# Patient Record
Sex: Female | Born: 1950
Health system: Southern US, Community
[De-identification: ages and names within clinical notes are randomized; demographics above are authoritative.]

## PROBLEM LIST (undated history)

## (undated) DIAGNOSIS — M1990 Unspecified osteoarthritis, unspecified site: Secondary | ICD-10-CM

## (undated) DIAGNOSIS — G459 Transient cerebral ischemic attack, unspecified: Secondary | ICD-10-CM

## (undated) DIAGNOSIS — K635 Polyp of colon: Secondary | ICD-10-CM

## (undated) DIAGNOSIS — J449 Chronic obstructive pulmonary disease, unspecified: Secondary | ICD-10-CM

## (undated) DIAGNOSIS — G4700 Insomnia, unspecified: Secondary | ICD-10-CM

## (undated) DIAGNOSIS — F32A Depression, unspecified: Secondary | ICD-10-CM

## (undated) DIAGNOSIS — E785 Hyperlipidemia, unspecified: Secondary | ICD-10-CM

## (undated) DIAGNOSIS — D649 Anemia, unspecified: Secondary | ICD-10-CM

## (undated) DIAGNOSIS — F17200 Nicotine dependence, unspecified, uncomplicated: Secondary | ICD-10-CM

## (undated) DIAGNOSIS — K219 Gastro-esophageal reflux disease without esophagitis: Secondary | ICD-10-CM

## (undated) DIAGNOSIS — I10 Essential (primary) hypertension: Secondary | ICD-10-CM

## (undated) DIAGNOSIS — E119 Type 2 diabetes mellitus without complications: Secondary | ICD-10-CM

## (undated) DIAGNOSIS — F40298 Other specified phobia: Secondary | ICD-10-CM

## (undated) DIAGNOSIS — F445 Conversion disorder with seizures or convulsions: Secondary | ICD-10-CM

## (undated) DIAGNOSIS — I2510 Atherosclerotic heart disease of native coronary artery without angina pectoris: Secondary | ICD-10-CM

## (undated) DIAGNOSIS — I639 Cerebral infarction, unspecified: Secondary | ICD-10-CM

## (undated) DIAGNOSIS — Z8711 Personal history of peptic ulcer disease: Secondary | ICD-10-CM

## (undated) DIAGNOSIS — I739 Peripheral vascular disease, unspecified: Secondary | ICD-10-CM

## (undated) DIAGNOSIS — M810 Age-related osteoporosis without current pathological fracture: Secondary | ICD-10-CM

## (undated) DIAGNOSIS — Z9289 Personal history of other medical treatment: Secondary | ICD-10-CM

## (undated) DIAGNOSIS — N189 Chronic kidney disease, unspecified: Secondary | ICD-10-CM

## (undated) HISTORY — DX: Personal history of other medical treatment: Z92.89

## (undated) HISTORY — DX: Atherosclerotic heart disease of native coronary artery without angina pectoris: I25.10

## (undated) HISTORY — PX: OTHER SURGICAL HISTORY: SHX169

## (undated) HISTORY — DX: Transient cerebral ischemic attack, unspecified: G45.9

## (undated) HISTORY — DX: Gastro-esophageal reflux disease without esophagitis: K21.9

## (undated) HISTORY — PX: BREAST BIOPSY: SHX20

## (undated) HISTORY — DX: Cerebral infarction, unspecified: I63.9

## (undated) HISTORY — DX: Personal history of peptic ulcer disease: Z87.11

## (undated) HISTORY — DX: Age-related osteoporosis without current pathological fracture: M81.0

## (undated) HISTORY — PX: APPENDECTOMY: SHX54

## (undated) HISTORY — DX: Essential (primary) hypertension: I10

## (undated) HISTORY — DX: Hyperlipidemia, unspecified: E78.5

## (undated) HISTORY — DX: Chronic kidney disease, unspecified: N18.9

## (undated) HISTORY — DX: Conversion disorder with seizures or convulsions: F44.5

## (undated) HISTORY — DX: Peripheral vascular disease, unspecified: I73.9

## (undated) HISTORY — PX: KNEE ARTHROSCOPY: SHX127

## (undated) HISTORY — DX: Type 2 diabetes mellitus without complications: E11.9

## (undated) HISTORY — DX: Insomnia, unspecified: G47.00

## (undated) HISTORY — DX: Other specified phobia: F40.298

## (undated) HISTORY — DX: Nicotine dependence, unspecified, uncomplicated: F17.200

## (undated) MED FILL — Cyclophosphamide For Inj 1 GM: INTRAMUSCULAR | Qty: 40 | Status: AC

## (undated) MED FILL — Docetaxel Soln for IV Infusion 160 MG/16ML: INTRAVENOUS | Qty: 10 | Status: AC

## (undated) MED FILL — Docetaxel Soln for IV Infusion 160 MG/16ML: INTRAVENOUS | Qty: 8 | Status: AC

---

## 1999-09-16 ENCOUNTER — Other Ambulatory Visit: Admission: RE | Admit: 1999-09-16 | Discharge: 1999-09-16 | Payer: Self-pay | Admitting: Orthopedic Surgery

## 1999-09-16 ENCOUNTER — Encounter (INDEPENDENT_AMBULATORY_CARE_PROVIDER_SITE_OTHER): Payer: Self-pay

## 2004-07-07 ENCOUNTER — Ambulatory Visit: Payer: Self-pay | Admitting: Cardiology

## 2004-07-07 ENCOUNTER — Inpatient Hospital Stay (HOSPITAL_COMMUNITY): Admission: AD | Admit: 2004-07-07 | Discharge: 2004-07-08 | Payer: Self-pay | Admitting: Cardiology

## 2005-02-11 ENCOUNTER — Ambulatory Visit: Payer: Self-pay | Admitting: Cardiology

## 2005-02-19 ENCOUNTER — Ambulatory Visit: Payer: Self-pay

## 2005-02-27 ENCOUNTER — Ambulatory Visit: Payer: Self-pay

## 2005-03-11 ENCOUNTER — Ambulatory Visit: Payer: Self-pay | Admitting: Cardiology

## 2005-03-13 ENCOUNTER — Ambulatory Visit (HOSPITAL_COMMUNITY): Admission: RE | Admit: 2005-03-13 | Discharge: 2005-03-14 | Payer: Self-pay | Admitting: Cardiology

## 2005-03-13 ENCOUNTER — Ambulatory Visit: Payer: Self-pay | Admitting: Cardiology

## 2005-04-07 ENCOUNTER — Ambulatory Visit: Payer: Self-pay | Admitting: Cardiology

## 2006-02-19 ENCOUNTER — Ambulatory Visit: Payer: Self-pay

## 2006-02-26 ENCOUNTER — Ambulatory Visit: Payer: Self-pay | Admitting: Cardiology

## 2006-03-02 ENCOUNTER — Ambulatory Visit: Payer: Self-pay

## 2006-06-16 ENCOUNTER — Ambulatory Visit: Payer: Self-pay

## 2006-06-16 ENCOUNTER — Ambulatory Visit: Payer: Self-pay | Admitting: Cardiology

## 2006-06-18 ENCOUNTER — Inpatient Hospital Stay (HOSPITAL_BASED_OUTPATIENT_CLINIC_OR_DEPARTMENT_OTHER): Admission: RE | Admit: 2006-06-18 | Discharge: 2006-06-18 | Payer: Self-pay | Admitting: Cardiology

## 2006-06-18 ENCOUNTER — Ambulatory Visit: Payer: Self-pay | Admitting: Cardiology

## 2007-06-16 ENCOUNTER — Ambulatory Visit: Payer: Self-pay

## 2007-08-11 HISTORY — PX: CARDIAC CATHETERIZATION: SHX172

## 2007-08-11 HISTORY — PX: CHOLECYSTECTOMY: SHX55

## 2007-09-09 ENCOUNTER — Ambulatory Visit: Payer: Self-pay | Admitting: Cardiovascular Disease

## 2008-03-23 ENCOUNTER — Ambulatory Visit: Payer: Self-pay | Admitting: Cardiovascular Disease

## 2008-03-28 ENCOUNTER — Encounter: Payer: Self-pay | Admitting: Cardiovascular Disease

## 2008-03-28 ENCOUNTER — Ambulatory Visit: Payer: Self-pay

## 2008-07-26 ENCOUNTER — Ambulatory Visit: Payer: Self-pay | Admitting: Cardiovascular Disease

## 2008-08-07 ENCOUNTER — Ambulatory Visit: Payer: Self-pay | Admitting: Cardiovascular Disease

## 2008-08-07 ENCOUNTER — Inpatient Hospital Stay (HOSPITAL_BASED_OUTPATIENT_CLINIC_OR_DEPARTMENT_OTHER): Admission: RE | Admit: 2008-08-07 | Discharge: 2008-08-07 | Payer: Self-pay | Admitting: Cardiovascular Disease

## 2008-08-28 ENCOUNTER — Ambulatory Visit: Payer: Self-pay | Admitting: Cardiology

## 2009-02-01 DIAGNOSIS — Z8711 Personal history of peptic ulcer disease: Secondary | ICD-10-CM

## 2009-02-01 DIAGNOSIS — F40298 Other specified phobia: Secondary | ICD-10-CM

## 2009-02-01 DIAGNOSIS — I10 Essential (primary) hypertension: Secondary | ICD-10-CM | POA: Insufficient documentation

## 2009-02-01 DIAGNOSIS — Z720 Tobacco use: Secondary | ICD-10-CM | POA: Insufficient documentation

## 2009-02-01 DIAGNOSIS — I739 Peripheral vascular disease, unspecified: Secondary | ICD-10-CM | POA: Insufficient documentation

## 2009-02-01 DIAGNOSIS — Z8679 Personal history of other diseases of the circulatory system: Secondary | ICD-10-CM | POA: Insufficient documentation

## 2009-02-01 DIAGNOSIS — E785 Hyperlipidemia, unspecified: Secondary | ICD-10-CM

## 2009-02-01 DIAGNOSIS — K219 Gastro-esophageal reflux disease without esophagitis: Secondary | ICD-10-CM

## 2009-02-01 DIAGNOSIS — F17200 Nicotine dependence, unspecified, uncomplicated: Secondary | ICD-10-CM

## 2009-02-01 DIAGNOSIS — I2510 Atherosclerotic heart disease of native coronary artery without angina pectoris: Secondary | ICD-10-CM

## 2009-02-01 DIAGNOSIS — E119 Type 2 diabetes mellitus without complications: Secondary | ICD-10-CM | POA: Insufficient documentation

## 2009-02-06 ENCOUNTER — Ambulatory Visit: Payer: Self-pay | Admitting: Cardiovascular Disease

## 2009-08-08 ENCOUNTER — Ambulatory Visit (HOSPITAL_COMMUNITY): Admission: RE | Admit: 2009-08-08 | Discharge: 2009-08-08 | Payer: Self-pay | Admitting: Family Medicine

## 2009-08-08 ENCOUNTER — Ambulatory Visit: Payer: Self-pay

## 2009-08-08 ENCOUNTER — Encounter: Payer: Self-pay | Admitting: Cardiovascular Disease

## 2009-08-08 ENCOUNTER — Ambulatory Visit: Payer: Self-pay | Admitting: Cardiovascular Disease

## 2009-08-08 DIAGNOSIS — G459 Transient cerebral ischemic attack, unspecified: Secondary | ICD-10-CM | POA: Insufficient documentation

## 2009-08-12 ENCOUNTER — Ambulatory Visit: Payer: Self-pay | Admitting: Cardiovascular Disease

## 2010-03-03 ENCOUNTER — Ambulatory Visit: Payer: Self-pay | Admitting: Cardiovascular Disease

## 2010-08-18 ENCOUNTER — Ambulatory Visit
Admission: RE | Admit: 2010-08-18 | Discharge: 2010-08-18 | Payer: Self-pay | Source: Home / Self Care | Attending: Cardiovascular Disease | Admitting: Cardiovascular Disease

## 2010-09-07 LAB — CONVERTED CEMR LAB
ALT: 29 units/L (ref 0–35)
AST: 39 units/L — ABNORMAL HIGH (ref 0–37)
BUN: 8 mg/dL (ref 6–23)
CO2: 24 meq/L (ref 19–32)
Chloride: 106 meq/L (ref 96–112)
Cholesterol: 134 mg/dL (ref 0–200)
Creatinine, Ser: 0.9 mg/dL (ref 0.4–1.2)
LDL Cholesterol: 68 mg/dL (ref 0–99)
Potassium: 3.4 meq/L — ABNORMAL LOW (ref 3.5–5.1)
Total Bilirubin: 0.6 mg/dL (ref 0.3–1.2)
Total CHOL/HDL Ratio: 3
Total Protein: 7 g/dL (ref 6.0–8.3)
Triglycerides: 125 mg/dL (ref 0.0–149.0)

## 2010-09-09 NOTE — Assessment & Plan Note (Signed)
Summary: 6 month rov   Visit Type:  6 months follow up Primary Provider:  Dr Sedalia Muta Rosalita Levan  CC:  Sob.  History of Present Illness: 60 year-old woman with CAD s/p RCA stenting in 2005. She has had recurrent chest pain and underwent cath in 2009 showing patent stents and nonobstructive disease elsewhere.  Pt also has history of iliac stenting with f/u ABI's in 2007 normal. She continues to smoke one ppd cigarettes.  Complains of chronic dyspnea with exertion, also complains of chest pain with exertion and at rest. These complaints are longstanding. No edema, lightheadedness, or syncope. No palps.      Current Medications (verified): 1)  Isosorbide Dinitrate Cr 40 Mg Cr-Tabs (Isosorbide Dinitrate) .... Take Two Tablets Every 8 Hours 2)  Metformin Hcl 1000 Mg Tabs (Metformin Hcl) .... Take 1 Tablet By Mouth Two Times A Day 3)  Atenolol 50 Mg Tabs (Atenolol) .... Take One Tablet By Mouth Twice A Day 4)  Diovan 320 Mg Tabs (Valsartan) .... Take One Tablet By Mouth Daily 5)  Seroquel 50 Mg Tabs (Quetiapine Fumarate) .... Take 1 Tablet By Mouth Once A Day At Bedtime 6)  Crestor 10 Mg Tabs (Rosuvastatin Calcium) .... Take One Tablet By Mouth Daily. 7)  Plavix 75 Mg Tabs (Clopidogrel Bisulfate) .... Take One Tablet By Mouth Daily 8)  Tricor 145 Mg Tabs (Fenofibrate) .... Take 1 Tablet By Mouth Once A Day 9)  Omeprazole 40 Mg Cpdr (Omeprazole) .... Take 1 Capsule By Mouth Two Times A Day 10)  Cymbalta 60 Mg Cpep (Duloxetine Hcl) .... Take 1 Capsule By Mouth Once A Day 11)  Aspirin 81 Mg Tbec (Aspirin) .... Take One Tablet By Mouth Daily 12)  Nitroglycerin 0.4 Mg Subl (Nitroglycerin) .... One Tablet Under Tongue Every 5 Minutes As Needed For Chest Pain---May Repeat Times Three 13)  Hydrochlorothiazide 25 Mg Tabs (Hydrochlorothiazide) .... Take One Tablet By Mouth Daily. 14)  Zolpidem Tartrate 10 Mg Tabs (Zolpidem Tartrate) .Marland Kitchen.. 1 By Mouth As Needed 15)  Lidoderm 5 % Ptch (Lidocaine) .... As  Needed 16)  Potassium Chloride Cr 10 Meq Cr-Caps (Potassium Chloride) .... Take One Tablet By Mouth Daily 17)  Nexium 40 Mg Cpdr (Esomeprazole Magnesium) .... Take 1 Capsule By Mouth Two Times A Day 18)  Gabapentin 600 Mg Tabs (Gabapentin) .... Take 1 Tablet By Mouth Three Times A Day 19)  Victoza 18 Mg/29ml Soln (Liraglutide) .... As Needed  Allergies (verified): 1)  ! Celebrex  Past History:  Past medical history reviewed for relevance to current acute and chronic problems.  Past Medical History: Current Problems:  CAD (ICD-414.00)- nonobstructive disease with patent to right coronary artery stent 2009 PERIPHERAL VASCULAR DISEASE (ICD-443.9) s/p iliac stenting CHEST PAIN, HX OF (ICD-V12.50) HYPERTENSION (ICD-401.9) HYPERLIPIDEMIA (ICD-272.4) GASTROESOPHAGEAL REFLUX DISEASE (ICD-530.81) DIABETES MELLITUS, TYPE II (ICD-250.00) TOBACCO ABUSE (ICD-305.1) PEPTIC ULCER DISEASE, HX OF (ICD-V12.71) CLAUSTROPHOBIA (ICD-300.29) MINI STROKES HX OF  Review of Systems       Positive for acid reflux/dyspepsia with frequent regurgitation, otherwise negative except as per HPI   Vital Signs:  Patient profile:   60 year old female Height:      62 inches Weight:      163.25 pounds BMI:     29.97 Pulse rate:   103 / minute Pulse rhythm:   irregular Resp:     18 per minute BP sitting:   110 / 70  (left arm) Cuff size:   large  Vitals Entered By: Vikki Ports (March 03, 2010 12:09 PM)  Physical Exam  General:  Pt is alert and oriented, in no acute distress. HEENT: normal Neck: normal carotid upstrokes without bruits, JVP normal Lungs: CTA CV: RRR without murmur or gallop Abd: soft, NT, positive BS, no bruit, no organomegaly Ext: no clubbing, cyanosis, or edema. peripheral pulses 2+ and equal Skin: warm and dry without rash    EKG  Procedure date:  03/03/2010  Findings:      Sinus tachycardia, HR 103 bpm, cannot rule out inferior MI age undetermined, incomplete  RBBB.  Impression & Recommendations:  Problem # 1:  CAD (ICD-414.00) Pt is stable. Chest pain is chronic and unchanged from the time of her cardiac cath in 2009. No further evaluation at this time. Continue current medical therapy.  The pt lives in [place] and requests follow-up closer to home. Will schedule her to see Dr Kirke Corin in 6 months.  Her updated medication list for this problem includes:    Isosorbide Dinitrate Cr 40 Mg Cr-tabs (Isosorbide dinitrate) .Marland Kitchen... Take two tablets every 8 hours    Atenolol 50 Mg Tabs (Atenolol) .Marland Kitchen... Take one tablet by mouth twice a day    Plavix 75 Mg Tabs (Clopidogrel bisulfate) .Marland Kitchen... Take one tablet by mouth daily    Aspirin 81 Mg Tbec (Aspirin) .Marland Kitchen... Take one tablet by mouth daily    Nitroglycerin 0.4 Mg Subl (Nitroglycerin) ..... One tablet under tongue every 5 minutes as needed for chest pain---may repeat times three  Problem # 2:  HYPERTENSION (ICD-401.9) BP well-controlled on current Rx.  Her updated medication list for this problem includes:    Atenolol 50 Mg Tabs (Atenolol) .Marland Kitchen... Take one tablet by mouth twice a day    Diovan 320 Mg Tabs (Valsartan) .Marland Kitchen... Take one tablet by mouth daily    Aspirin 81 Mg Tbec (Aspirin) .Marland Kitchen... Take one tablet by mouth daily    Hydrochlorothiazide 25 Mg Tabs (Hydrochlorothiazide) .Marland Kitchen... Take one tablet by mouth daily.  BP today: 110/70 Prior BP: 140/94 (08/12/2009)  Labs Reviewed: K+: 3.4 (08/12/2009) Creat: : 0.9 (08/12/2009)   Chol: 134 (08/12/2009)   HDL: 40.90 (08/12/2009)   LDL: 68 (08/12/2009)   TG: 125.0 (08/12/2009)  Problem # 3:  HYPERLIPIDEMIA (ICD-272.4) Lipids at goal on combination of rosuvastatin and tricor with LDL less than 70 mg/dL.  Her updated medication list for this problem includes:    Crestor 10 Mg Tabs (Rosuvastatin calcium) .Marland Kitchen... Take one tablet by mouth daily.    Tricor 145 Mg Tabs (Fenofibrate) .Marland Kitchen... Take 1 tablet by mouth once a day  CHOL: 134 (08/12/2009)   LDL: 68 (08/12/2009)    HDL: 40.90 (08/12/2009)   TG: 125.0 (08/12/2009)  Problem # 4:  PERIPHERAL VASCULAR DISEASE (ICD-443.9) No claudication symptoms at present and peripheral pulses are intact and equal. Smoking cessation advised.  Other Orders: EKG w/ Interpretation (93000)  Patient Instructions: 1)  Your physician wants you to follow-up in: 6 months with Dr. Kirke Corin in [place].  You will receive a reminder letter in the mail two months in advance. If you don't receive a letter, please call our office to schedule the follow-up appointment. 2)  Your physician recommends that you continue on your current medications as directed. Please refer to the Current Medication list given to you today.

## 2010-09-09 NOTE — Assessment & Plan Note (Signed)
Summary: Lindsay Brooks   Visit Type:  Follow-up Primary Provider:  Dr Sedalia Muta Rosalita Levan  CC:  shortness of breath.  History of Present Illness: 60 year-old woman with CAD s/p RCA stenting in 2005. She has had recurrent chest pain and underwent cath in 2009 showing patent stents and nonobstructive disease elsewhere.  Pt also has history of iliac stenting with f/u ABI's in 2007 normal. She continues to smoke.  the patient denies chest pain. She has chronic stable dyspnea with exertion. She denies edema, orthopnea, or PND.  She describes an episode over the holidays where she lost memory of events. This happened on 2 separate occasions. She is being followed by a neurologist in [place] and has undergone cerebral imaging.    Current Medications (verified): 1)  Isosorbide Dinitrate Cr 40 Mg Cr-Tabs (Isosorbide Dinitrate) .... Take Two Tablets Every 8 Hours 2)  Metformin Hcl 1000 Mg Tabs (Metformin Hcl) .... Take 1 Tablet By Mouth Two Times A Day 3)  Atenolol 50 Mg Tabs (Atenolol) .... Take One Tablet By Mouth Twice A Day 4)  Diovan 320 Mg Tabs (Valsartan) .... Take One Tablet By Mouth Daily 5)  Seroquel 50 Mg Tabs (Quetiapine Fumarate) .... Take 1 Tablet By Mouth Once A Day At Bedtime 6)  Vytorin 10-80 Mg Tabs (Ezetimibe-Simvastatin) .... Take One Tablet By Mouth Dailyat Bedtimer 7)  Plavix 75 Mg Tabs (Clopidogrel Bisulfate) .... Take One Tablet By Mouth Daily 8)  Tricor 145 Mg Tabs (Fenofibrate) .... Take 1 Tablet By Mouth Once A Day 9)  Omeprazole 40 Mg Cpdr (Omeprazole) .... Take 1 Capsule By Mouth Two Times A Day 10)  Cymbalta 60 Mg Cpep (Duloxetine Hcl) .... Take 1 Capsule By Mouth Once A Day 11)  Glipizide 5 Mg Xr24h-Tab (Glipizide) .... Take 1 Tablet By Mouth Once A Day 12)  Aspirin 81 Mg Tbec (Aspirin) .... Take One Tablet By Mouth Daily 13)  Nitroglycerin 0.4 Mg Subl (Nitroglycerin) .... One Tablet Under Tongue Every 5 Minutes As Needed For Chest Pain---May Repeat Times Three 14)  Lorazepam  0.5 Mg Tabs (Lorazepam) .... As Needed 15)  Hydrochlorothiazide 12.5 Mg Tabs (Hydrochlorothiazide) .... Take One Tablet By Mouth Daily. 16)  Zolpidem Tartrate 10 Mg Tabs (Zolpidem Tartrate) .Marland Kitchen.. 1 By Mouth As Needed 17)  Lidoderm 5 % Ptch (Lidocaine) .... As Needed  Allergies (verified): No Known Drug Allergies  Past History:  Past medical history reviewed for relevance to current acute and chronic problems.  Past Medical History: Current Problems:  CAD (ICD-414.00)- nonobstructive disease with patent to right coronary artery stent 2009 PERIPHERAL VASCULAR DISEASE (ICD-443.9) CHEST PAIN, HX OF (ICD-V12.50) HYPERTENSION (ICD-401.9) HYPERLIPIDEMIA (ICD-272.4) GASTROESOPHAGEAL REFLUX DISEASE (ICD-530.81) DIABETES MELLITUS, TYPE II (ICD-250.00) TOBACCO ABUSE (ICD-305.1) PEPTIC ULCER DISEASE, HX OF (ICD-V12.71) CLAUSTROPHOBIA (ICD-300.29) MINI STROKES HX OF  Review of Systems       Negative except as per HPI   Vital Signs:  Patient profile:   60 year old female Height:      62 inches Weight:      166 pounds BMI:     30.47 Pulse rate:   112 / minute Resp:     16 per minute BP sitting:   140 / 94  (right arm)  Vitals Entered By: Marrion Coy, CNA (August 12, 2009 9:58 AM)  Physical Exam  General:  Pt is alert and oriented, in no acute distress. HEENT: normal Neck: normal carotid upstrokes without bruits, JVP normal Lungs: CTA CV: RRR without murmur or gallop Abd:  soft, NT, positive BS, no bruit, no organomegaly Ext: no clubbing, cyanosis, or edema. peripheral pulses 2+ and equal Skin: warm and dry without rash    EKG  Procedure date:  08/12/2009  Findings:      Sinus tachycardia, incomplete right bundle branch block, age indeterminate inferior infarct. Heart rate 112 beats per minute.  Impression & Recommendations:  Problem # 1:  CAD (ICD-414.00) The patient is stable without angina. Catheterization results from 2009 were reviewed. Continue current medical  therapy. Her updated medication list for this problem includes:    Isosorbide Dinitrate Cr 40 Mg Cr-tabs (Isosorbide dinitrate) .Marland Kitchen... Take two tablets every 8 hours    Atenolol 50 Mg Tabs (Atenolol) .Marland Kitchen... Take one tablet by mouth twice a day    Plavix 75 Mg Tabs (Clopidogrel bisulfate) .Marland Kitchen... Take one tablet by mouth daily    Aspirin 81 Mg Tbec (Aspirin) .Marland Kitchen... Take one tablet by mouth daily    Nitroglycerin 0.4 Mg Subl (Nitroglycerin) ..... One tablet under tongue every 5 minutes as needed for chest pain---may repeat times three  Orders: EKG w/ Interpretation (93000) TLB-BMP (Basic Metabolic Panel-BMET) (80048-METABOL) TLB-Hepatic/Liver Function Pnl (80076-HEPATIC) TLB-Lipid Panel (80061-LIPID)  Problem # 2:  HYPERTENSION (ICD-401.9) Blood pressure above goal. Add hydrochlorothiazide 25 mg daily. Her updated medication list for this problem includes:    Atenolol 50 Mg Tabs (Atenolol) .Marland Kitchen... Take one tablet by mouth twice a day    Diovan 320 Mg Tabs (Valsartan) .Marland Kitchen... Take one tablet by mouth daily    Aspirin 81 Mg Tbec (Aspirin) .Marland Kitchen... Take one tablet by mouth daily    Hydrochlorothiazide 25 Mg Tabs (Hydrochlorothiazide) .Marland Kitchen... Take one tablet by mouth daily.  BP today: 140/94 Prior BP: 144/86 (02/06/2009)  Problem # 3:  HYPERLIPIDEMIA (ICD-272.4) The patient is on multidrug therapy, including high-dose simvastatin. Will check lipids and LFTs today and likely reduce her simvastatin dose to 40 mg. Will await results prior to changing dose. Her updated medication list for this problem includes:    Vytorin 10-80 Mg Tabs (Ezetimibe-simvastatin) .Marland Kitchen... Take one tablet by mouth dailyat bedtimer    Tricor 145 Mg Tabs (Fenofibrate) .Marland Kitchen... Take 1 tablet by mouth once a day  Orders: TLB-BMP (Basic Metabolic Panel-BMET) (80048-METABOL) TLB-Hepatic/Liver Function Pnl (80076-HEPATIC) TLB-Lipid Panel (80061-LIPID)  Patient Instructions: 1)  Your physician recommends that you return for a FASTING  lipid profile: Liver profile.and BMP today 2)  Your physician recommends that you schedule a follow-up appointment in: 6 months. The office will mail you a reminder 2 months prior appointment date. 3)  Your physician has recommended you make the following change in your medication: Hydrochlorothiazide 25 mg once aday Prescriptions: HYDROCHLOROTHIAZIDE 25 MG TABS (HYDROCHLOROTHIAZIDE) Take one tablet by mouth daily.  #90 x 3   Entered by:   Ollen Gross, RN, BSN   Authorized by:   Norva Karvonen, MD   Signed by:   Ollen Gross, RN, BSN on 08/12/2009   Method used:   Print then Give to Patient   RxID:   [id number]

## 2010-12-23 NOTE — Assessment & Plan Note (Signed)
[facility]                            CARDIOLOGY OFFICE NOTE   Lindsay Brooks, Lindsay Brooks                     MRN:          [id number]  DATE:03/23/2008                            DOB:          Jun 08, 1951    Lindsay Brooks was seen in followup with our [facility] Cardiology Office  on March 23, 2008.  She is a 60 year old woman with coronary and  peripheral arterial disease.  She has undergone drug eluting stent  placement in the right coronary artery as well as stenting of the left  iliac artery.  She has had some problems with chest pain over the years  and at the time of her most recent catheterization in 2007 she had  nonobstructive disease with patent stents.  Recently, she had developed  nausea, vomiting, and diarrhea and has been diagnosed with  cholelithiasis.  She is going to see a Careers adviser next week and thinks that  she will need a cholecystectomy.  She has had a few episodes of chest  pain and had occurred after vomiting.  Otherwise, she denies any  exertional chest pain or pressure.  She denies dyspnea.  She has no  orthopnea, PND or edema.   CURRENT MEDICATIONS:  1. Nexium 40 mg daily.  2. Isosorbide 40 mg t.i.d.  3. Cyclobenzaprine 5 mg at bedtime.  4. Metformin 1 g twice daily.  5. Valsartan 320 mg at bedtime.  6. Januvia 100 mg daily.  7. Plavix 75 mg daily.  8. Vytorin 10/80 mg daily.  9. Cymbalta 60 mg daily.  10.TriCor 145 mg daily.  11.Aspirin 81 mg daily.  12.Gabapentin 300 mg at bedtime.  13.Atenolol 50 mg twice daily.  14.Meloxicam 7.5 mg at bedtime.  15.Glipizide 5 mg daily.  16.Quetiapine fumarate 25 mg at bedtime.   ALLERGIES:  NKDA.   PHYSICAL EXAMINATION:  GENERAL:  The patient is alert and oriented.  She  is in no acute distress.  VITAL SIGNS:  Weight is 175 pounds, blood pressure 128/90, heart rate  76, and respiratory rate 12.  HEENT:  Normal.  NECK:  Normal carotid upstrokes without bruits.  JVP normal.  LUNGS:   Clear bilaterally.  HEART:  Regular rate and rhythm.  No murmurs or gallops.  ABDOMEN:  Soft and nontender.  No organomegaly.  No abdominal bruits.  EXTREMITIES:  No clubbing, cyanosis, or edema.  SKIN:  Warm and dry without rash.   EKG shows sinus rhythm with incomplete right bundle-branch, unchanged  from previous tracings.   ASSESSMENT:  1. Coronary artery disease, status post stenting of the right coronary      artery several years ago.  The patient remains on dual antiplatelet      therapy.  I think with upcoming surgery, we should do a stress test      to rule out significant ischemia.  She has had episodes of chest      pain that followed her vomiting spells.  If her stress test is at      low risk, she can proceed with surgery.  It will be okay to  interrupt her antiplatelet therapy with aspirin and Plavix for a      short time, so that she can have a lower bleeding risk with      surgery.  Otherwise, we will continue her medications without any      changes.  She should receive beta-blockade throughout the      perioperative period.  2. Dyslipidemia.  The patient is followed by her primary care      physician.  3. Hypertension.  Reasonable control on her current medical regimen.  4. Peripheral arterial disease.  No claudication symptoms at present.      Last ABIs were normal bilaterally.  This was in the fall where her      ABIs were 1.1 on both sides.   For followup, I would like to see the patient back in 6 months.  We will  finish her surgical clearance as soon as her stress test is completed.      Veverly Fells. Excell Seltzer, MD  Electronically Signed    MDC/MedQ  DD: 03/23/2008  DT: 03/24/2008  Job #: [id number]   cc:   Dr.  Blane Ohara in [place]

## 2010-12-23 NOTE — Assessment & Plan Note (Signed)
[facility]                            CARDIOLOGY OFFICE NOTE   Lindsay Brooks, Lindsay Brooks                     MRN:          [id number]  DATE:08/28/2008                            DOB:          [birth year]    PRIMARY CARDIOLOGIST:  Veverly Fells. Excell Seltzer, MD   PATIENT PROFILE:  A 60 year old Caucasian female with prior history of  CAD status post stenting of the right coronary artery, who presents for  clinic followup after recent catheterization.   PROBLEMS:  1. History of chest pain/coronary artery disease.      a.     On June 29, 2004, status post percutaneous coronary       intervention stenting of the mid right coronary artery with       placement of two, 3.5 x 16-mm CoStar #II drug-eluting study       stents.      b.     On March 28, 2008, negative adenosine Myoview, normal left       ventricular function.      c.     On August 07, 2008, cardiac catheterization, left main       mild diffuse stenosis.  Left anterior descending normal, diagonal       normal, left circumflex 4% ostial.  Right coronary artery 30%       ostial, 30-40% proximal, widely patent stent.  Distal right       coronary artery is 40% stenosis.  Left ventricular function was       normal.  2. Peripheral arterial disease status post left common and external      iliac stenting.      a.     Last ankle-brachial index in November 2007 were normal.  3. Ongoing tobacco abuse, currently smoking 1 pack a day.  4. Hypertension.  5. Hyperlipidemia.  6. Type 2 diabetes mellitus.  7. Remote peptic ulcer disease.   HISTORY OF PRESENT ILLNESS:  A 60 year old Caucasian female was last  seen in clinic by Dr. Excell Seltzer on July 26, 2008.  At that time, she  was reporting chest discomfort and subsequently underwent cardiac  catheterization on December 29 revealing nonobstructive disease with  patent stent of RCA.  Since her stenting, she did have a rather  stressful episode in her life  when her youngest daughter overdosed on  painkillers and required hospitalization.  She stated this was very  stressful situation and on the day that she found out, she did have some  chest discomfort without associated symptoms.  She did not take anything  nor did she seek medical attention for this.  Again, this was after her  recent catheterization which showed nonobstructive disease.  As the  stress of her daughter's condition has eased off, she has not had any  recurrent chest discomfort.  She unfortunately continues to smoke 1 pack  of cigarettes a day and does hope to be cut down again, if not quit.  She has been tolerating her home medications well and otherwise offers  no complaints today.   ALLERGIES:  No known drug allergies.  HOME MEDICATIONS:  1. Isosorbide dinitrate 40 mg t.i.d.  2. Metformin 1000 mg b.i.d.  3. Atenolol 50 mg b.i.d.  4. Diovan 320 mg daily.  5. Quetiapine 50 mg nightly.  6. Vytorin 10/80 mg daily.  7. Plavix 75 mg daily.  8. TriCor 145 mg daily.  9. Omeprazole 40 mg b.i.d.  10.Cymbalta 60 mg daily.  11.Glipizide 5 mg daily.  12.Aspirin 81 mg daily.  13.Nitroglycerin p.r.n.   PHYSICAL EXAMINATION:  VITAL SIGNS:  Blood pressure 126/82; heart rate  73; respirations 18; and her weight is 175 pounds, which is down 2  pounds since her last visit.  GENERAL:  A pleasant white female in no acute distress.  Awake, alert,  and oriented x3.  PSYCH:  Normal affect.  HEENT:  Normal.  NEURO:  Grossly intact, nonfocal.  SKIN:  Warm and dry without lesions or masses.  MUSCULOSKELETAL:  Grossly normal without deformity or effusions.  NECK:  No bruits or JVD.  LUNGS:  Respirations are regular and unlabored with diminished breath  sounds, but otherwise clear to auscultation.  CARDIAC:  Regular S1 and S2.  No S3, S4, or murmurs.  ABDOMEN:  Round, soft, nontender, and nondistended.  Bowel sounds  present x4.  The right groin site is clear of bleeding, bruise,  or  hematoma.  EXTREMITIES:  Warm, dry, and pink.  No clubbing, cyanosis, or edema.  Dorsalis pedis and posterior tibial pulses are 2+ equal bilaterally.   ACCESSORY CLINICAL FINDINGS:  EKG shows sinus rhythm with incomplete  right bundle branch block.  No acute ST-T changes.   ASSESSMENT AND PLAN:  1. Coronary artery disease.  The patient is status post      catheterization revealing nonobstructive disease with patent to      right coronary artery stent.  She remains on medical therapy      including beta-blocker, angiotensin receptor blocker, aspirin,      statin, Plavix, and nitrate therapy.  She has had chest pain, which      based on her catheterization findings appears to be noncardiac and      by her report, specifically related to stressful situations.  2. Hypertension, stable.  3. Hyperlipidemia.  She remains on Vytorin therapy, which she      tolerates well.  She has lipids followed by her primary care.  4. Diabetes mellitus.  She takes glipizide and metformin.  This is      followed by primary care.  5. Tobacco abuse.  Smoking cessation strongly advised.  She states she      has been a half pack a day, but when her daughter became ill, she      went back up to a pack a day.  She      realized she needs to quit.  6. Disposition.  The patient will follow up with Dr. Excell Seltzer in 4      months or sooner if necessary.      Nicolasa Ducking, ANP  Electronically Signed      Rollene Rotunda, MD, [facility]  Electronically Signed   CB/MedQ  DD: 08/28/2008  DT: 08/29/2008  Job #: [id number]

## 2010-12-23 NOTE — Cardiovascular Report (Signed)
Lindsay Brooks, Lindsay Brooks              ACCOUNT NO.:  [id number]   MEDICAL RECORD NO.:  [id number]          PATIENT TYPE:  OIB   LOCATION:  1966                         FACILITY:  [facility]   PHYSICIAN:  Veverly Fells. Excell Seltzer, MD  DATE OF BIRTH:  07-10-1951   DATE OF PROCEDURE:  DATE OF DISCHARGE:  08/07/2008                            CARDIAC CATHETERIZATION   PROCEDURES:  Left heart catheterization, selective coronary angiography,  left ventricular angiography.   INDICATIONS:  Ms. Tooley is a 60 year old woman with known CAD.  She has  had progressive chest pain under emotional stress over the last several  months.  She had a nonischemic stress test approximately 6 months ago.  However, with progressive symptoms and known CAD, I elected to proceed  with cardiac catheterization.   Risks and indications of procedure were reviewed with the patient in  detail.  Informed consent was obtained.  The right groin was prepped and  draped, anesthetized 1% lidocaine using modified Seldinger technique.  A  4-French sheath was placed in the right femoral artery.  Standard 4-  French Judkins catheters were used for selective coronary angiography  and left ventriculography.  All catheter exchange were performed over  guidewire.  The patient tolerated the procedure well.   FINDINGS:  Aortic pressure 126/61 with the mean of 93, left ventricular  pressure 130/22.   Left ventriculography:  LV systolic function is normal.  The LVEF is  65%.  There is no mitral regurgitation.   Coronary angiography, left mainstem:  The left mainstem is short.  It's  left vein has mild diffuse stenosis, but no significant obstructive  disease.  The left main bifurcates in the LAD and left circumflex.  The  LAD is a large caliber vessel proximally.  It tapers after the first  diagonal branch.  The first diagonal branch is a large vessel that  covers moderately large territory.  The LAD does not quite reach the LV  apex.  There  is no significant obstructive disease in the LAD or  diagonal.   Left circumflex has an ostial 40% narrowing that courses down and  supplies two OM branches without any significant stenosis.   Right coronary artery, the right coronary artery has a mild ostial  lesion at the range of 30%.  Further down in the proximal vessel, there  is a 30-40% stenosis leading into a stented segment that is widely  patent.  There is minimal restenosis in the stent of approximately 10-  20%.  Beyond the distal edge of the stent there is a 40% stenosis  distally, the vessel supplies a PDA branch.  A large acute marginal  branch and two posterolaterals, there is no significant stenosis in the  distal RCA branches.   ASSESSMENT:  1. Patent right coronary artery stents.  2. Diffuse nonobstructive coronary artery disease.  3. Normal left ventricular function.   PLAN:  Recommend continued medical therapy.  Suspect noncardiac chest  pain.      Veverly Fells. Excell Seltzer, MD  Electronically Signed     MDC/MEDQ  D:  08/07/2008  T:  08/08/2008  Job:  [id number]   cc:   Blane Ohara, MD

## 2010-12-23 NOTE — Assessment & Plan Note (Signed)
[facility]                            CARDIOLOGY OFFICE NOTE   MAZIKEEN, HEHN                     MRN:          [id number]  DATE:07/26/2008                            DOB:          08-07-1951    REASON FOR VISIT:  Chest pain.   HISTORY OF PRESENT ILLNESS:  Ms. Kope is a 60 year old woman with  coronary and peripheral arterial disease.  She has had a drug-eluting  stent in the right coronary artery, which was done for treatment of  class III angina.  She has done well since stenting, but that has had  problems with recurrent chest pain.  Over the last month, she has  developed increased symptoms.  She has had a lot of stress secondary to  family issues.  She describes a feeling of chest tightness and  squeezing that occurs with light-to-moderate housework.  Her symptoms  have been progressive over the last 3 months.  She has no rest pain.  She also describes exertional dyspnea with low-level activity, but this  is more of chronic complaint.  She complains of leg swelling at night,  but this has not been a problem during the day.  She has left leg pain  involving the calf and foot with moderate level activity.  She had  complained of chest pain at the time leading after gallbladder surgery  in the summer and she underwent an adenosine Myoview myocardial  perfusion scan at that point.  This demonstrated no evidence of scar or  myocardial ischemia.   MEDICATIONS:  1. Isosorbide 40 mg t.i.d.  2. Metformin 1 g twice daily.  3. Atenolol 50 mg twice daily.  4. Diovan 320 mg daily.  5. Quetiapine 50 mg at bedtime.  6. Vytorin 10/80 mg daily.  7. Plavix 75 mg daily.  8. TriCor 145 mg daily.  9. Omeprazole 40 mg b.i.d.  10.Cymbalta 60 mg daily.  11.Glipizide 5 mg daily.  12.Aspirin 81 mg daily.   ALLERGIES:  NKDA.   PAST MEDICAL HISTORY:  Focused past medical history includes,  1. CAD.  Her last cardiac catheterization dated June 18, 2006      showed mild-to-moderate nonobstructive CAD and a patent stent in      the right coronary artery.  2. Peripheral arterial disease status post left common and external      iliac stenting.  This was also patent at the time of cardiac      catheterization in 2007.  3. Ongoing tobacco use.  4. Hypertension.  5. Type 2 diabetes.  6. Dyslipidemia.  7. Remote peptic ulcer disease   REVIEW OF SYSTEMS:  A 12-point review of systems was performed.  Pertinent positives included insomnia and depression.  All other systems  were reviewed and are negative.   EKG shows normal sinus rhythm with right bundle-branch block.  This is  unchanged from previous.   ASSESSMENT:  1. Coronary artery disease with signs of increasing angina.  The      patient currently has class III anginal symptoms.  She does not      appear  to have unstable symptoms and has had no rest pain.  I think      her symptoms are worrisome and I have recommended diagnostic      cardiac catheterization.  Risks and indications were reviewed in      detail and the patient agrees to proceed.  She will remain on dual      antiplatelet therapy with aspirin and Plavix as well as her current      antianginal regimen.  She was instructed on signs and symptoms of      unstable angina or worsening symptoms and that she needed to seek      immediate medical attention if that occurs.  2. Dyslipidemia.  Continue current regimen.  The patient is followed      by her primary care physician.  She is on aggressive treatment with      Vytorin and TriCor.  3. Type 2 diabetes, managed with oral hypoglycemics.  4. Hypertension, blood pressure under good control on her current      regimen.   Further plans pending on cardiac cath results.  We will schedule for  routine 80-month followup as well.     Veverly Fells. Excell Seltzer, MD  Electronically Signed    MDC/MedQ  DD: 07/27/2008  DT: 07/27/2008  Job #: [id number]

## 2010-12-23 NOTE — Assessment & Plan Note (Signed)
[facility]                        [place] CARDIOLOGY OFFICE NOTE   Lindsay Brooks, Lindsay Brooks                     MRN:          [id number]  DATE:08/18/2010                            DOB:          Apr 11, 1951    PRIMARY CARE PHYSICIAN:  Dr. Sedalia Muta.   Lindsay Brooks is a 60 year old female who is here today for of a 79-month  followup visit.  She usually follows up with Dr. Excell Seltzer, but now she  will be following up with [place] is close [place].  She has  the following problem list:  1. Coronary artery disease status post RCA angioplasty and stent      placement in 2005.  Most recent cardiac catheterization was done in      2009, which showed patent stents with no obstructive disease      elsewhere.  2. Peripheral vascular disease status post iliac stenting.  3. Tobacco use.  4. Hypertension.  5. Hyperlipidemia.  6. Gastroesophageal reflux disease.  7. Type 2 diabetes.  8. Peptic ulcer disease.  9. COPD.   INTERIM HISTORY:  Lindsay Brooks overall seems to be stable since her last  visit.  She has chronic exertional dyspnea and palpitations.  She always  has sinus tachycardia.  She does not exercise regularly and continues to  smoke.  She gets chest pain when she overexerts herself, but this has  not changed in quality over the last 6 months.  She does complain of  left foot discomfort with walking.  She has been reportedly taking all  her medications.   MEDICATIONS:  1. Buspirone 150 mg twice daily.  2. Hydrochlorothiazide 25 mg once daily.  3. Januvia 100 mg once daily.  4. Aspirin 81 mg once daily.  5. Nexium 40 mg twice daily.  6. Metformin 1000 mg twice daily.  7. Crestor 20 mg once daily.  8. TriCor 145 mg once daily.  9. Diovan 320 mg once daily.  10.Cymbalta 60 mg two in the evening.  11.Ambien 10 mg at bedtime as needed.  12.Isosorbide 40 mg twice daily.  13.Plavix 75 mg once daily.  14.Atenolol 50 mg twice daily.   ALLERGIES:   No known drug allergies.   SOCIAL HISTORY:  Remarkable for smoking one pack per day for many years.   FAMILY HISTORY:  Noncontributory.   PHYSICAL EXAMINATION:  GENERAL:  The patient appears to be older than  her stated age, but in no acute distress.  VITAL SIGNS:  Weight is 154.4 pounds, blood pressure is 118/81, pulse is  106, oxygen saturation is 97% on room air.  HEENT:  Normocephalic, atraumatic.  NECK:  No JVD or carotid bruits.  RESPIRATORY:  Normal respiratory effort with no use of accessory  muscles.  Auscultation reveals diminished breath sounds bilaterally but  no wheezing.  CARDIOVASCULAR:  Normal PMI.  Normal S1 and S2 with no gallops or  murmurs.  ABDOMEN:  Benign, nontender, nondistended.  EXTREMITIES:  No clubbing, cyanosis, or edema.  SKIN:  Warm and dry with no rash.  PSYCHIATRIC:  She is alert, oriented x3 with normal mood  and affect.   An electrocardiogram was performed which showed sinus tachycardia with  right bundle-branch block with possible old inferior infarct.  EKG is  unchanged when compared to her previous one.   IMPRESSION:  1. Coronary artery disease status post RCA angioplasty and stent      placement:  She has chronic chest pain, which has not changed      lately.  Some of this seems to be due to physical deconditioning,      sinus tachycardia, and continued smoking.  I suspect that her lung      disease is also contributing.  At this time, we will continue with      current medical management that include aspirin and Plavix as well      as atenolol and lipid and blood pressure management.  2. Peripheral vascular disease:  There is no convincing symptoms of      claudication at this time.  Her most recent ABI was within normal      limits.  We will continue with Plavix.  3. Hyperlipidemia:  Most recent lipid profile was done in January 2011      and possibly, she had one after that with her primary care      physician.  Her total cholesterol was  134, triglyceride 125, HDL      41, and LDL of 68.  Her lipid profile seems to be at target.  4. Tobacco use:  The patient was counseled about the importance of      smoking cessation.  She will follow up with me in 6 months from now      or earlier if needed.     Lindsay Bears, MD  Electronically Signed    MA/MedQ  DD: 08/18/2010  DT: 08/19/2010  Job #: [id number]

## 2010-12-23 NOTE — Assessment & Plan Note (Signed)
[facility]                            CARDIOLOGY OFFICE NOTE   [name], [name]                     MRN:          [id number]  DATE:09/09/2007                            DOB:          September 06, 1950    Lindsay Brooks returns for follow-up at the [facility] Office on  September 09, 2007.  Lindsay Brooks is 60 year old woman with coronary and  peripheral arterial disease.  She has undergone prior stenting of the  right coronary artery as well as the left iliac artery.   Lindsay Brooks has had chronic chest pain even after her coronary  revascularization.  She has been stable recently and over the last  several months has only required an average of 2 nitroglycerin per  month.  She stays physically active but does not participate in regular  exercise.  She complains of stable exertional dyspnea.  She also has  stable leg pain with approximately 100 yards of walking.  This pain is  worse on the left leg than the right.  She has not had any ulcerations  or problems with rest pain.  She did try to do regular walking and  thinks this has helped to a degree.  She has no other complaints today.   CURRENT MEDICATIONS:  Plavix 75 mg daily, Vytorin 10/80 mg daily,  Cymbalta 60 mg daily, Tricor 145 mg daily aspirin 81 mg daily, metformin  1 gram in the morning 500 mg in evening, gabapentin 300 mg bedtime,  atenolol 50 mg b.i.d. meloxicam 7.5 mg at bedtime, glipizide 5 mg daily,  Nexium 40 mg daily, isosorbide 40 mg t.i.d., valsartan 160 mg at  bedtime, and cyclobenzaprine 5 mg.   EXAM:  The patient is alert and oriented.  She is in no acute distress.  Weight 168, blood pressure 157/95, heart rate 70, respiratory rate 16.  HEENT:  Normal.  NECK:  Normal carotid upstrokes without bruits.  Jugular venous pressure  is normal.  LUNGS:  Clear bilaterally.  HEART:  Regular rate and rhythm without murmurs or gallops.  ABDOMEN:  Soft, nontender no organomegaly.  EXTREMITIES:  No clubbing, cyanosis or edema.  Femoral pulses 2+ and  equal.  Dorsalis pedis pulses are 2+ bilaterally.  Skin is warm and dry  without rash.   EKG shows sinus rhythm with incomplete right bundle branch block  pattern.  Otherwise within normal limits.   ASSESSMENT:  1. Coronary artery disease status post PCI of the right coronary      artery.  Most recent cath in November 2007 demonstrated      nonobstructive disease with patent stents in the right coronary      artery.  There was notation of a 50% stenosis distal to the stented      segment in the right coronary artery.  Continue current medical      therapy.  The patient has minimal angina at present.  2. Claudication.  Symptoms are stable and currently are not lifestyle-      limiting.  Her ABIs are normal.  At the time of her last  catheterization, she underwent distal aortography that demonstrated      a widely patent stent on the left.  Smoking cessation was      reinforced.  3. Diabetes.  Management per Dr. Sedalia Muta.  4. Hypercholesterolemia.  The patient is on multidrug therapy and is      managed by Dr. Sedalia Muta.   For follow-up I would like to see Lindsay Brooks back in 6 months.     Veverly Fells. Excell Seltzer, MD  Electronically Signed    MDC/MedQ  DD: 09/12/2007  DT: 09/13/2007  Job #: [id number]   cc:   Janine Limbo, MD

## 2010-12-26 NOTE — Cardiovascular Report (Signed)
NAMEJOHNICE, Lindsay Brooks              ACCOUNT NO.:  [id number]   MEDICAL RECORD NO.:  [id number]          PATIENT TYPE:  INP   LOCATION:  6533                         FACILITY:  [facility]   PHYSICIAN:  Salvadore Farber, M.D. LHCDATE OF BIRTH:  09-24-1950   DATE OF PROCEDURE:  07/07/2004  DATE OF DISCHARGE:                              CARDIAC CATHETERIZATION   PROCEDURE:  Drug-eluting stent placement to the mid RCA as part of the  COSTAR 2 study.   INDICATION:  Ms. Uliano is a 60 year old lady with hypertension, diabetes  mellitus, dyslipidemia, and ongoing tobacco abuse who presented with stable  angina.  She underwent diagnostic angiography this morning by Dr. Tomie China  demonstrating an 80% stenosis of the mid RCA.  She was transferred to a  sheath then placed for percutaneous revascularization.   PROCEDURAL TECHNIQUE:  Informed consent was obtained.  The patient consented  to participate in the COSTAR 2 study comparing two drug-eluting stents.  Via  the pre-existing right common femoral arterial sheath was exchanged over  wire for a  fresh 6 [place] sheath.  A 6 French Alwright 3.5 guide was  advance over wire and engaged in the ostium of the RCA.  200 mcg of  intracoronary Adenosine was administered.  Angiography was performed in  multiple views by hand injection.  The lesion was readily crossed using a  [place] wire.  It was then predilated using a 3.0 x 12-mm Voyager for two  inflations at 8 atmospheres.  The lesion was stented using a 3.5 x 15-mm  COSTAR at 16 atmospheres.  There was a severe stenosis at the distal stent  margin that appeared to be wire biased.  Therefore, the stent was post  dilated using a 4.0 x 12-mm Quantum for single inflation of 18 atmospheres.  Wire was then removed.  With removal of the wire, the severe stenosis at the  distal margin remained.  It appeared that an S-curve in the vessel that had  been present prior to stenting had in fact had been  straightened resulting  in a kink at the distal stent margin.  This was not resolved with  intracoronary nitroglycerin.  As the resultant stenosis was greater than  80%, I decided to place a second overlapping stent to attempt to smooth this  stenosis.  A second 3.5 x 16-mm COSTAR study stent was deployed at 16  atmospheres.  The entirety of both stents were then post dilated using a 4.0  x 12-mm Quantum at 16 atmospheres in the distal stent and 18 atmospheres at  the site of overlap of the stents.  There again appeared similar kinking  phenomenon.  However, after administration of nitroglycerin this largely  resolved with residual stenosis of less than 30%.  Intravascular ultrasound  was then performed using a Galaxy 40 megahertz probe.  Prior to  intravascular ultrasound, additional 200 mcg of intracoronary nitroglycerin  was administered.  IVUS pictures demonstrated no severe stenosis at the  distal margin of the stent and the stents to be fully expanded and well  apposed throughout their entire course.  There was no  evidence of dissection  at the distal margin.  Repeat angiography was then performed after the  administration of further intracoronary nitroglycerin (200 mcg).  Flow to  the distal vasculature was TIMI-3.   COMPLICATIONS:  Propagation of a bend in the vessel to the distal stent  margin requiring a second stent.   IMPRESSION/RECOMMENDATIONS:  Successful drug-eluting stent placement in the  lesion in the mid RCA with excellent angiographic result.  The patient  should be continued on Plavix for a minimum of six months.  Aspirin should be continued indefinitely with dose of 325 mg daily for at  least 8 months.  After that, lower dose could be considered.  Will  discontinue her Aggrenox in favor of this antiplatelet therapy for the  months to come.  Per study protocol, she will undergo followup angiogram at  9 months.      Will   WED/MEDQ  D:  07/07/2004  T:   07/07/2004  Job:  [id number]   cc:   Dr. Arley Phenix in [facility] R. Revankar, M.D.  [address]  [place]  [place] [address]  Fax: [phone]

## 2010-12-26 NOTE — Assessment & Plan Note (Signed)
[facility]                              CARDIOLOGY OFFICE NOTE   CHANITA, BODEN                     MRN:          [id number]  DATE:06/16/2006                            DOB:          12-Oct-1950    CHIEF COMPLAINT:  Chest pain, left leg pain with exertion.   HISTORY OF PRESENT ILLNESS:  Ms. Lindsay Brooks is a 60 year old lady with coronary  and peripheral arterial disease. She has had prior stenting of the right  coronary artery with preserved left ventricular systolic function. She has  also had stenting of the left external iliac artery for claudication.   Ms. [name] presents with two complaints today. The first is of chest  discomfort. On October 13, she was at a family reunion and felt the sudden  onset of severe squeezing pain in both upper arms accompanied by nausea and  diaphoresis. This lasted for a few hours and then resolved. On October 27,  she had similar discomfort which was not as severe and lasted only about 30  minutes. She took some nitroglycerin with subsequent relief. She has not had  any recurrence of these symptoms. She does have chronic exertional dyspnea.  In late July of this year, we obtained an exercise test for atypical chest  discomfort. On that she was only able to go 3 minutes and 45 seconds of the  standard Bruce protocol stopping due to fatigue. There was no evidence of  ischemia or infarction at 94% of her predicted maximal heart rate. Ejection  fraction was measured at 90%.   Her second complaint is of left leg discomfort with walking 100 or so yards,  particularly worse with hills. She has not had any resting pain except for  some nocturnal leg cramps bilaterally. She feels that her left leg pain is  similar to that prior to the left iliac stent.   PAST MEDICAL HISTORY:  1. Coronary artery disease status post RCA stent as above.  2. Hypertension.  3. Diabetes mellitus.  4. Dyslipidemia.  5. Remote peptic  ulcer disease.  6. Peripheral arterial disease as above.   ALLERGIES:  No known drug allergies.   CURRENT MEDICATIONS:  1. Omeprazole 40 mg per day.  2. Imdur 20 mg 3 times per day.  3. Diovan 80 mg per day.  4. Atenolol 50 mg per day.  5. Plavix 75 mg per day.  6. Vytorin 10/80 one per day.  7. Cymbalta 60 mg per day.  8. Tricor 145 mg per day.  9. Aspirin 81 mg per day.  10.Glutiapine 25 mg twice per day.  11.Metformin 100 mg in the morning and 500 mg in the evening.   SOCIAL HISTORY:  The patient is married and lives with her husband in  [place]. She has custody of her 31 year old granddaughter.  She does  continue to smoke. Denies alcohol and illicit drug use.   FAMILY HISTORY:  Father died of myocardial infarction at 60 after 2 CABGs in  his late 51s. Mother is alive with coronary disease. A brother has coronary  disease onset in his 57s and  a sister with heart disease in her 91s. Another  sister died of melanoma at 10. A child has epilepsy.   REVIEW OF SYSTEMS:  Hard of hearing in her left ear. Occasional urinary  frequency otherwise negative in detail except as above.   PHYSICAL EXAMINATION:  GENERAL:  She is generally well-appearing in no  distress.  VITAL SIGNS:  Heart rate 76, blood pressure 130/82 on the left and 126/80 on  the right. She weighs 176 pounds.  NECK:  She has no jugular venous distention, no thyromegaly.  LUNGS:  Clear to auscultation.  CARDIAC:  She has a nondisplaced point of maximal cardiac impulse. There is  a regular rate and rhythm without murmur, rub, or gallop.  ABDOMEN:  Soft, nondistended, nontender. There is no hepatosplenomegaly.  Bowel sounds are normal.  EXTREMITIES:  Warm without clubbing, cyanosis, edema, or ulceration.  MUSCULOSKELETAL:  Normal.  HEENT:  Normal.  SKIN:  Normal.  Carotids pulses 2+ bilaterally without bruit. No abdominal bruit. Femoral  pulses 2+ on the right and 1-2+ on the left without bruit. DP pulses 2+   bilaterally.  NEUROLOGIC:  She is alert and oriented x3 with cranial nerves II-XII intact.  Strength and sensation intact throughout.   Electrocardiogram today demonstrates normal sinus rhythm with incomplete  right bundle branch block. There is no significant change compared with  prior.   ABIs today are 1.1 bilaterally.   IMPRESSION/RECOMMENDATIONS:  1. Chest pain:  Symptoms clearly very concerning for myocardial infarction      several weeks ago with post infarct pain. No symptoms in two weeks.      Fortunately electrocardiogram is unchanged. Given her known coronary      disease and this quite worrisome symptom as well as the very poor      exercise tolerance seen on a recent stress test, I feel we need to      proceed to angiography for definitive exclusion of myocardial ischemia      as the cause of her symptoms. Will make no change in her cardiac      medications until then. Will schedule this for the days to come.  2. Leg pain:  Symptoms certainly consistent with restenosis. Ankle      brachial indices are normal. However, they were normal prior to      revascularization as well. Will look at the iliac stent at the time of      the cardiac catheterization.  3. Diabetes mellitus:  Hold metformin for 2 days prior to the procedure.  4. Hypercholesterolemia:  Continue Vytorin.     Salvadore Farber, MD  Electronically Signed    WED/MedQ  DD: 06/16/2006  DT: 06/16/2006  Job #: [id number]

## 2010-12-26 NOTE — Op Note (Signed)
Lindsay Brooks, Lindsay Brooks              ACCOUNT NO.:  [id number]   MEDICAL RECORD NO.:  [id number]          PATIENT TYPE:  OIB   LOCATION:  2899                         FACILITY:  [facility]   PHYSICIAN:  Salvadore Farber, M.D. LHCDATE OF BIRTH:  1950/09/06   DATE OF PROCEDURE:  03/13/2005  DATE OF DISCHARGE:                                 OPERATIVE REPORT   PROCEDURE PERFORMED:  Left leg angiography via contralateral common femoral  approach, stenting of the left common iliac artery.   INDICATIONS FOR PROCEDURE:  Lindsay Brooks is a 60 year old lady who presents  with several years of left calf, shin, and foot numbness with walking as  well as right hip discomfort with exertion.  At cardiac catheterization she  had abdominal aortography demonstrating approximately 60% stenosis of the  left common iliac artery extending into the proximal portion of the external  iliac.  Resting ankle brachial indices were 1.1 on the right and 0.94 on the  left.  However, with only two minutes of walking on the treadmill, she  because profoundly symptomatic in her left hip with ABI dropping to 0.18 on  the left.  It had not recovered to baseline by 10 minutes of rest.  She  therefore presents for left leg angiography and possible percutaneous  intervention.   PROCEDURAL TECHNIQUE:  Informed consent was obtained.  Under 1% lidocaine  local anesthesia, a 6 French Terumo guide sheath was placed in the right  femoral artery using modified Seldinger technique.  A cross over catheter  was used to selectively engage the left common iliac artery.  A Wholey wire  was advanced into the profunda.  The sheath was then advanced to the common  iliac artery.  Angiography was performed by power injection using digital  subtraction in a step table technique with imaging from the aortic  bifurcation to the toes.  This confirmed the ultrasound suggestion that  there was no disease below the level of the external iliac artery.   Decision  was made to proceed to stenting of the left common/external iliac arteries.   Anticoagulation was initiated with 5000 units of heparin.  ACT was confirmed  to be greater than 275 seconds.  The lesion was directly stented using a 7 x  29 Genesis inflated to 10 atmospheres.  There was a small non-flow limiting  dissection at the medial superior aspect of the stent.  This was non-flow  limiting and there was no persistent dye hang up.  The midportion of the  stent was then further dilated using an 8 x 20 mm PowerFlex at 8  atmospheres.  With this, the patient had mild discomfort.  Final angiography  demonstrated persistence of the small dissection proximal to the stent but  no dye hang up or retardation of flow.  There was no residual stenosis.  The  patient tolerated the procedure well and was transferred to the holding room  in stable condition.   COMPLICATIONS:  None.   FINDINGS:  1.  Left iliac system:  70% stenosis of the common iliac extending into the  proximal portion of the external.  This was stented using a balloon      expandable stent and no residual.  The remainder of the external iliac      was normal.  The common femoral, profunda, SFA, and popliteal were all      normal.  There was three-vessel runoff to the foot via normal calf      vessels.   IMPRESSION/RECOMMENDATION:  Successful stenting of the left common/external  iliac artery.  She will be maintained on Plavix for one month.  Aspirin will  be continued indefinitely.       WED/MEDQ  D:  03/13/2005  T:  03/14/2005  Job:  [id number]   cc:   Lindsay Brooks, M.D.  [address]  [place]  [place] [address]  Fax: [phone]   Lindsay Brooks, M.D.  [address]  [place]  [place] [address]  Fax: [phone]

## 2010-12-26 NOTE — H&P (Signed)
NAMEALONA, DANFORD              ACCOUNT NO.:  [id number]   MEDICAL RECORD NO.:  [id number]          PATIENT TYPE:  INP   LOCATION:  2109                         FACILITY:  [facility]   PHYSICIAN:  Learta Codding, M.D. LHCDATE OF BIRTH:  1951/01/18   DATE OF ADMISSION:  07/07/2004  DATE OF DISCHARGE:                                HISTORY & PHYSICAL   PRIMARY CARE PHYSICIAN:  Dr. Jayme Cloud in [place], [place].   PRIMARY CARDIOLOGIST:  Aundra Dubin. Revankar, M.D.   CHIEF COMPLAINT:  Chest pain/RCA stenosis.   HISTORY OF PRESENT ILLNESS:  Ms. Pierre is a 60 year old female with no  known history of coronary artery disease.  She was referred to Dr. Tomie China  by Dr. Jayme Cloud for chest pain that was non-exertional and described as a  tightness.  Her Cardiolite was without abnormality, but she had recurrent  symptoms, and it was felt that cardiac catheterization was indicated.  This  was performed today at [facility].  An 80% RCA stenosis was seen, and  it was felt that percutaneous intervention was needed.  Ms. Darwish was  loaded with 300 mg of Plavix and sent to [facility] for  percutaneous intervention.  She is currently pain free, and denies shortness  of breath.   PAST MEDICAL HISTORY:  1.  Significant for type 2 diabetes.  2.  Hypertension.  3.  Hyperlipidemia.  4.  Family history of coronary artery disease.  5.  Ongoing tobacco use.  6.  She also has a history of mini strokes.  7.  She has a history of peptic ulcer disease.  8.  Gastroesophageal reflux disease.  9.  Hiatal hernia.  10. Questionable history of claustrophobia.   SURGICAL HISTORY:  1.  Cardiac catheterization today.  2.  Status post hysterectomy.  3.  Left rotator cuff repair.  4.  Left arm surgery.   ALLERGIES:  No known drug allergies.   MEDICATIONS:  (Doses not available.)  1.  Isosorbide.  2.  Lipitor.  3.  Metformin.  4.  Tricor.  5.  Nexium.  6.  Cymbalta.  7.  Sublingual  nitroglycerin p.r.n.   SOCIAL HISTORY:  She lives in [place] with her husband, and is currently not  working outside the home.  She has a greater than 40 pack year history of  tobacco use, but does not abuse alcohol or drugs.   FAMILY HISTORY:  Her mother is alive at age 31, and has had an MI in the  past.  Her father died at age 37 after having 2 bypass surgeries and an MI.  She has 1 younger brother who is alive and has a history of coronary artery  disease.   REVIEW OF SYSTEMS:  Positive for coughing and wheezing upon occasion,  dizziness upon occasion, claudication when she walks up hills or up steps.  She has chest pain that is non-exertional and described as a tightness, and  the episodes have been getting more frequent.  She has chronic dyspnea on  exertion, and occasional edema.  She is seeing a therapist for depression.  She  has occasional arthralgias.  She has no reflux symptoms, as long as she  takes her Nexium.   REVIEW OF SYSTEMS:  Otherwise negative.   PHYSICAL EXAMINATION:  VITAL SIGNS:  Temperature is 98.4, blood pressure  122/56, pulse 76, respiratory rate 18, O2 saturation 99% on 2 liters.  GENERAL:  She is a well-developed, slightly obese white female in no acute  distress.  HEENT:  Head is normocephalic and atraumatic with pupils equal, round and  reactive to light and accommodation.  Extraocular movements intact.  Sclerae  are clear.  Dentition is poor.  NECK:  There is no lymphadenopathy, thyromegaly, bruit, or JVD noted.  CARDIOVASCULAR:  Her heart is regular in rate and rhythm with an S1 and S2,  and no significant murmur, rub, or gallop is noted.  Her distal pulses are  2+ on the right, and slightly decreased in the left lower extremity.  Sheath  is in the right groin, and the femoral bruit is appreciated on the left.  LUNGS:  A few rales in the bases, but otherwise clear.  SKIN:  No rashes or lesions are noted.  ABDOMEN:  Soft and nontender with active  bowel sounds.  EXTREMITIES:  There is no clubbing, cyanosis, or edema.  MUSCULOSKELETAL:  There is no joint deformity or effusions.  NEUROLOGIC:  She is alert and oriented.  Cranial nerves II-XII are grossly  intact.   Chest x-ray done July 04, 2004 at [facility] showed no acute  disease.   EKG was performed July 02, 2004 and showed sinus rhythm at a rate of 70,  and no acute changes.   LABORATORY VALUES:  Performed at [facility] and include hemoglobin of 13.8,  hematocrit of 40.5, WBC's of 7.4, platelets of 275.  Sodium of 141,  potassium 4.5, chloride 107, CO2 of 27, BUN 20, creatinine 0.9, glucose 76.  Her INR was 1.1 with a PTT of 30.8.   ASSESSMENT AND PLAN:  1.  Right coronary artery stenosis.  The patient is here today for      percutaneous intervention.  She is currently pain free on IV      nitroglycerin and heparin.  She has had aspirin today and been loaded      with Plavix at 300 mg  2.  Hyperlipidemia.  Her husband is to bring in her medications, and we will      restart her on her home Lipitor and      Tricor doses.  She is to follow up with Dr. Jayme Cloud and Dr. Tomie China.  3.  The patient is otherwise stable.  Will hold metformin for now and use      sliding scale insulin for her diabetes.   This is Theodore Demark, P.A. [facility] dictating for Dr. Learta Codding, M.D. [facility]  who determined the plan of care.      Rhon   RB/MEDQ  D:  07/07/2004  T:  07/07/2004  Job:  [id number]   cc:   Dr. Lucky Cowboy, [place]   Aundra Dubin. Revankar, M.D.  [address]  [place]  [place] [address]  Fax: [phone]

## 2010-12-26 NOTE — Discharge Summary (Signed)
NAMETALLI, KIMMER              ACCOUNT NO.:  [id number]   MEDICAL RECORD NO.:  [id number]          PATIENT TYPE:  OIB   LOCATION:  2021                         FACILITY:  [facility]   PHYSICIAN:  Salvadore Farber, M.D. LHCDATE OF BIRTH:  [birth year]   DATE OF ADMISSION:  03/13/2005  DATE OF DISCHARGE:  03/14/2005                           DISCHARGE SUMMARY - REFERRING   SUMMARY OF HISTORY:  Ms. Stirn is a 60 year old female who was referred to  Dr. Samule Ohm on February 09, 2005 from Dr. Tomie China in regards to Mrs. Ferdig's  possible claudication and her left external iliac stenosis.  It was  discovered at the time of cardiac catheterization. After evaluation, Dr.  Samule Ohm ordered ABIs which were felt to be 0.94 on the left and 1.11 on the  right.  Carotid Dopplers were also performed and showed they were at the 39%  bilateral ICA stenoses, right subclavian stenosis with steel.  He also  arranged for arteriogram for to further evaluate her claudication symptoms.   PAST MEDICAL HISTORY:  1.  Stenting of the RCA with preserved left ventricular function.  2.  Hypertension.  3.  Diabetes.  4.  Dyslipidemia.  5.  Remote peptic ulcer disease  6.  Tobacco use.  7.  Early family history of coronary artery disease.   LABORATORY DATA:  Chest x-ray at [facility] did not show any significant  abnormalities.  EKG from [facility] showed sinus rhythm, significant  baseline artifact. Preadmission hemoglobin was 13.6, hematocrit 40.2.  normal indices.  Platelets 255.  WBC 7.4, PTT 24.7, PT 12.4.  Sodium 138,  potassium 4.5, BUN 19, creatinine 0.9, glucose 102.   HOSPITAL COURSE:  Ms. Sirico was admitted to [facility]  to undergo  lower extremity angiography.  Dr. Samule Ohm performed this without difficulty  and noted a 70% iliac lesion.  This was reduced to 0% without difficulty.  She remained overnight secondary to history of hematoma at prior cardiac  catheterization.  The patient was very  anxious about being home alone.  On  March 14, 2005, Dr. Eden Emms reassessed, felt that the catheterization site  was intact and felt that she could be discharged home.   DISCHARGE DIAGNOSES:  1.  Claudication.  2.  Peripheral vascular disease, status post angioplasty as previously      described.  3.  Tobacco use.  4.  History for past medical history as described above.   DISPOSITION:  The patient was discharged home, ask to maintain a low-salt,  low-fat, low-cholesterol diet.  She received discharge instructions in  regards to wound care and activity level.  She was asked to resume her  metformin, 1000 mg daily on March 15, 2005.  She is to continue her home  medications which include:   DISCHARGE MEDICATIONS:  1.  Aspirin 81 mg daily.  2.  Plavix 75 mg daily.  3.  Isordil 20 mg t.i.d.  4.  Diovan 80 mg daily.  5.  Atenolol 50 mg daily.  6.  Lorazepam 0.5 mg as previously.  7.  Vytorin 10/80 mg daily.  8.  Lasix 10 mg  p.r.n.  9.  Cymbalta 60 mg daily.  10. Tricor 140 mg daily.  11. Nitroglycerin 0.4 mg p.r.n.   FOLLOW UP:  She was asked to call Dr. Tomie China on Monday to arrange follow  up appointment and also asked to call Dr. Samule Ohm on Monday to arrange  followup in four to six weeks.  She was also advised the risks of tobacco  products or smoking.  She was also given educational material in regards  hyperlipidemia and tobacco cessation prior to her discharge.       EW/MEDQ  D:  03/14/2005  T:  03/15/2005  Job:  [id number]   cc:   Salvadore Farber, M.D. [facility]  [address]  [address]  [place]  [place] [address]   Dr. Aliene Beams, [place]   Dr. Lulu Riding, [place]

## 2010-12-26 NOTE — Discharge Summary (Signed)
NAMEMYOSHA, CUADRAS              ACCOUNT NO.:  [id number]   MEDICAL RECORD NO.:  [id number]          PATIENT TYPE:  INP   LOCATION:  6533                         FACILITY:  [facility]   PHYSICIAN:  Learta Codding, M.D. LHCDATE OF BIRTH:  1951/07/07   DATE OF ADMISSION:  06/29/2004  DATE OF DISCHARGE:  07/08/2004                           DISCHARGE SUMMARY - REFERRING   DISCHARGE DIAGNOSES:  1.  Coronary artery disease      1.  Status post stent placement to the mid-right coronary artery on          July 01, 2004, (COSTAR-II study).  2.  Dyslipidemia.  3.  Diabetes mellitus type 2.  4.  Hypertension.  5.  Tobacco abuse.  6.  Family history of coronary artery disease.  7.  Gastroesophageal reflux disease/hiatal hernia.  8.  Questionable history of transient ischemic attack.  9.  Mildly elevated liver function tests.   HISTORY OF PRESENT ILLNESS:  Please see the admission history and physical  dictated on June 29, 2004, for complete details.  Briefly, this is a 60-  year-old female patient who was recently evaluated by Dr. Aundra Dubin. Revankar,  cardiologist, for chest discomfort.  She had a non-ischemic Cardiolite but  recurrent symptoms, and a cardiac catheterization was arranged.  The  catheterization was performed at [facility] and revealed an 80% RCA  stenosis.   HOSPITAL COURSE:  She was transferred for a PCI.  This was done on July 01, 2004, by Dr. Salvadore Farber.  The results are noted above.  Please  see his dictated note for complete details.  She tolerated the procedure  well without any complications.   DISPOSITION:  On the morning of July 08, 2004, she was evaluated and  felt ready for discharge to home.  She will need to hold her Metformin for  three days.  She will need followup with his primary care physician.  She  will need followup with Dr. Aundra Dubin. Revankar, cardiology, in the next two  weeks.  As noted above, she has been enrolled in the  COSTART-II research  trial and will she will be contacted by our office for followup there.   LABORATORY DATA:  On July 08, 2004, white count was 6100, hemoglobin 12,  hematocrit 34, platelet count 198,000.  Sodium 139, potassium 4.1, chloride  113, CO2 of 23, glucose 123, BUN 12, creatinine 0.9.  Total bilirubin 0.7,  alkaline phosphatase 41, AST 42 (range 0-37), ALT 45 (range 0-40), total  protein 5.4, albumin 3.2, calcium 8.5.  Hemoglobin A1c of 6.3.  Post-  procedure CK/MB negative.  Chest x-ray from July 04, 2004:  No acute disease.   DISCHARGE MEDICATIONS:  1.  Coated aspirin 325 mg daily.  2.  Plavix 75 mg daily for six months.  3.  Nexium 40 mg b.i.d.  4.  Tricor 154 mg daily.  5.  Lipitor as taken previously.  6.  Trisorbide as taken previously.  7.  Metformin as taken previously.  The patient knows to restart this on      Thursday, July 10, 2004.  8.  Cymbalta as taken previously.  9.  Nitroglycerin p.r.n. chest pain.  10. Pain management with Tylenol p.r.n.   DISCHARGE INSTRUCTIONS:  She is to call Dr. Tomie China or 911 for recurrent  chest pain.   ACTIVITY:  No driving, heavy lifting, exertion, or work for three days.   DIET:  Low-fat, low-sodium, diabetic diet.   WOUND CARE:  The patient is to call for any bleeding or bruising.   FOLLOWUP:  1.  With Dr. Tomie China in two weeks.  She should contact Dr. Tomie China for an      appointment.  She has been provided with his phone number.  2.  She should follow up with her primary care physician, Dr. Renaee Munda      in [place].  3.  Our research staff will contact her for followup per the COSTAR-II      protocol.      Scot   SW/MEDQ  D:  07/08/2004  T:  07/08/2004  Job:  [id number]   cc:   Renaee Munda, M.D.  [employer] - Phone [phone]   Aundra Dubin. Revankar, M.D.  [address]  [place]  [place] [address]  Fax: [phone]   E. Graceann Congress, M.D.

## 2010-12-26 NOTE — Assessment & Plan Note (Signed)
[facility]                              CARDIOLOGY OFFICE NOTE   Lindsay Brooks, Lindsay Brooks                     MRN:          [id number]  DATE:02/26/2006                            DOB:          Oct 06, 1950    HISTORY OF PRESENT ILLNESS:  Ms. Duba is a 60 year old lady with prior  placement of a drug-eluting stent placement in the right coronary artery.  Left ventricular systolic function is preserved.  I have also stented a left  external iliac artery stenosis.   Ms. Wisecup has done well from a claudication viewpoint.  She is now able to  walk without any symptoms at all.  ABIs were performed a week ago and are  1.2 bilaterally.   She does tell me that she had an episode of chest discomfort occurring while  working in her garden a week ago.  She felt squeezing substernal discomfort  that radiated to her jaw.  It resolved after a single sublingual  nitroglycerin.  She was not working particularly hard, but had been out  there for a long time in the heat.  She has had no recurrence.  There was no  associated dyspnea or palpitations.   CURRENT MEDICATIONS:  1.  Omeprazole 40 mg per day.  2.  Metformin 1000 mg per day.  3.  Imdur 20 mg 3 times per day.  4.  Diovan 80 mg per day.  5.  Atenolol 50 mg per day.  6.  Plavix 75 mg per day.  7.  Vytorin 10/80 one per day.  8.  Cymbalta 60 mg per day.  9.  Tricor 145 mg per day.  10. Aspirin 81 mg per day.  11. __________ 25 mg twice per day.  12. She has sublingual nitroglycerins.   PHYSICAL EXAMINATION:  She is generally well-appearing, in no distress with  heart rate 76, blood pressure 128/78 on the right and 132/80 on the left.  She has no jugular vein distention and no thyromegaly.  LUNGS:  Clear to auscultation.  HEART:  She has nondisplaced point of maximal cardiac impulse.  There is  regular rate and rhythm without murmur, rub or gallop.  ABDOMEN:  Soft, nondistended, nontender.  There is no  hepatosplenomegaly.  Bowel sounds are normal.  EXTREMITIES:  Warm and without clubbing, cyanosis, edema, ulceration.  PULSES:  Carotid pulses 2+ bilaterally, without bruit.  Femoral pulses 2+  bilaterally.  DP and PT pulses 2+ bilaterally.   Electrocardiogram demonstrates normal sinus rhythm with incomplete right  bundle branch block.   IMPRESSION/RECOMMENDATIONS:  1.  Chest discomfort:  Given known coronary disease, we will check exercise      Cardiolite.  Continue aspirin and beta blocker.  2.  Hypertension:  Nicely controlled on multiple medications.  3.  Diabetes mellitus:  Per primary care physician.  4.  Hypercholesterolemia:  Check fasting lipid profile at time of her      Cardiolite.  5.  Lower extremity vascular disease:  Asymptomatic, widely patent left      iliac stent.  6.  Prior tobacco abuse:  Applauded her on  remaining off tobacco.                                 Salvadore Farber, MD    WED/MedQ  DD:  02/26/2006  DT:  02/26/2006  Job #:  [id number]

## 2010-12-26 NOTE — Cardiovascular Report (Signed)
Lindsay Brooks, Lindsay Brooks              ACCOUNT NO.:  [id number]   MEDICAL RECORD NO.:  [id number]          PATIENT TYPE:  OIB   LOCATION:  1965                         FACILITY:  [facility]   PHYSICIAN:  Salvadore Farber, MD  DATE OF BIRTH:  [birth year]   DATE OF PROCEDURE:  06/18/2006  DATE OF DISCHARGE:  06/18/2006                              CARDIAC CATHETERIZATION   PROCEDURE:  Left heart catheterization, left ventriculography, coronary  angiography, angiography of the left leg from the origin of the iliac to the  common femoral, abdominal aortography.   INDICATIONS:  [name] is a 60 year old lady with coronary disease for  which she is status post prior placement of two drug-eluting stents in the  right coronary artery.  She has also had stenting of the left common and  external iliac artery for claudication.  She now presents having had two  episodes of chest pain 3 weeks ago and 2 weeks ago that was reminiscent of  that which accompanied her myocardial infarction.  Both episodes were fairly  brief.  She has had no recurrence stents.  She also says she is having left  leg discomfort similar to her prior claudication.  ABI is normal.  However,  it was only slightly abnormal when she had a severe iliac stenosis, the  stenting of which resulted in complete improvement in her claudication.  She  therefore presents for coronary angiography and angiography of the left leg.   PROCEDURAL TECHNIQUE:  Informed consent was obtained.  Under 1% lidocaine  local anesthesia, a 4-French sheath was placed in the right common femoral  artery using the modified Seldinger technique.  Diagnostic angiography and  ventriculography were performed using JL-4, JR-4, and pigtail catheters.  The pigtail catheter was then pulled back to the suprarenal abdominal aorta.  Abdominal aortography was performed by power injection.  I then used the 3D-  RCA catheter to selectively engage the left common iliac artery.  Angiography was performed by hand injection with imaging to the level of the  common femoral.  The patient tolerated this procedure well and was  transferred to the holding room in stable condition.  Sheath will be removed  there.   COMPLICATIONS:  None.   FINDINGS:  1. LV:  140/12/16.  EF 65% without regional wall motion abnormality.  2. No aortic stenosis or mitral regurgitation.  3. Left main:  Short vessel which is angiographically normal.  4. LAD:  Moderate-sized vessel giving rise to a fairly large first and      smaller second diagonal branch.  There is a 20% stenosis of the ostium      of the LAD.  5. Circumflex:  Moderate-sized vessel giving rise to two marginals.  It is      angiographically normal.  6. RCA:  Large, dominant vessel.  There is previously placed two      overlapping stents in the mid vessel.  Just distal to the stented      segment is a 50% stenosis.  7. Abdominal aorta:  Normal vessel without significant plaque or aneurysm.  8. Renal arteries:  Single vessels bilaterally.  Both are normal.  9. Left leg:  Widely patent left common iliac and external iliac stent.      The internal iliac is jailed but widely patent.  There are no other      stenoses in the common iliac, external iliac or common femoral.   IMPRESSION AND PLAN:  1. Moderate nonobstructive coronary disease.  Probable noncardiac source      to her chest discomfort.  2. Preserved left ventricular systolic function.  3. Patent left common/external iliac artery stent.      Salvadore Farber, MD  Electronically Signed     WED/MEDQ  D:  06/18/2006  T:  06/18/2006  Job:  [id number]   cc:   Dr. Lulu Riding, [place]

## 2011-02-03 ENCOUNTER — Encounter: Payer: Self-pay | Admitting: Cardiovascular Disease

## 2011-02-26 ENCOUNTER — Ambulatory Visit: Payer: Self-pay | Admitting: Cardiovascular Disease

## 2011-03-05 ENCOUNTER — Encounter: Payer: Self-pay | Admitting: Cardiovascular Disease

## 2011-03-05 ENCOUNTER — Ambulatory Visit (INDEPENDENT_AMBULATORY_CARE_PROVIDER_SITE_OTHER): Payer: Medicare Other | Admitting: Cardiovascular Disease

## 2011-03-05 VITALS — BP 119/78 | HR 73 | Ht 62.0 in | Wt 148.0 lb

## 2011-03-05 DIAGNOSIS — E785 Hyperlipidemia, unspecified: Secondary | ICD-10-CM

## 2011-03-05 DIAGNOSIS — I2510 Atherosclerotic heart disease of native coronary artery without angina pectoris: Secondary | ICD-10-CM | POA: Insufficient documentation

## 2011-03-05 DIAGNOSIS — I739 Peripheral vascular disease, unspecified: Secondary | ICD-10-CM

## 2011-03-05 DIAGNOSIS — I10 Essential (primary) hypertension: Secondary | ICD-10-CM

## 2011-03-05 NOTE — Progress Notes (Signed)
Addended by: Freddi Starr on: 03/05/2011 05:04 PM   Modules accepted: Orders

## 2011-03-05 NOTE — Assessment & Plan Note (Signed)
Her most recent lipid profile was at target. She will need followup lipid profile in 6 months.

## 2011-03-05 NOTE — Assessment & Plan Note (Signed)
Her blood pressure is well controlled. Continue current medications. 

## 2011-03-05 NOTE — Progress Notes (Signed)
HPI  This is a 60 year old female who is here today for a followup visit. She has a history of coronary artery disease status post angioplasty and stent placement to the proximal right coronary artery in 2005. She also has peripheral arterial disease status post stent placement to the iliac artery on the left side. She has chronic medical conditions that include type DM2 , hypertension, hyperlipidemia and chronic tobacco use. She has been under significant stress recently after her 32 year old granddaughter left the house with an older person whom she met online. She gets chest pain with anxiety and stress not exertional. She is back to smoking one half pack per day. She is also complaining of left leg discomfort in the calf with walking.  Allergies  Allergen Reactions  . Celecoxib      Current Outpatient Prescriptions on File Prior to Visit  Medication Sig Dispense Refill  . aspirin 81 MG tablet Take 81 mg by mouth daily.        Marland Kitchen atenolol (TENORMIN) 50 MG tablet Take 50 mg by mouth 2 (two) times daily.       . clopidogrel (PLAVIX) 75 MG tablet Take 75 mg by mouth daily.        . DULoxetine (CYMBALTA) 60 MG capsule Take 60 mg by mouth daily.        Marland Kitchen esomeprazole (NEXIUM) 40 MG capsule Take 40 mg by mouth 2 (two) times daily.        . fenofibrate (TRICOR) 145 MG tablet Take 145 mg by mouth daily.        . hydrochlorothiazide 25 MG tablet Take 25 mg by mouth daily.        . isosorbide dinitrate (ISOCHRON) 40 MG CR tablet Take 40 mg by mouth every 8 (eight) hours. Take 2 tabs       . metFORMIN (GLUCOPHAGE) 1000 MG tablet Take 1,000 mg by mouth daily. 2 TABLETS      . nitroGLYCERIN (NITROSTAT) 0.4 MG SL tablet Place 0.4 mg under the tongue every 5 (five) minutes as needed.        . potassium chloride (MICRO-K) 10 MEQ CR capsule Take 10 mEq by mouth daily.        . QUEtiapine (SEROQUEL) 50 MG tablet Take 50 mg by mouth at bedtime.        . rosuvastatin (CRESTOR) 10 MG tablet Take 10 mg by mouth  daily.        . valsartan (DIOVAN) 320 MG tablet Take 320 mg by mouth daily.        Marland Kitchen zolpidem (AMBIEN) 10 MG tablet Take 10 mg by mouth as needed.           Past Medical History  Diagnosis Date  . Peripheral vascular disease, unspecified     left illiac stenting  . Personal history of unspecified circulatory disease   . Type II or unspecified type diabetes mellitus without mention of complication, not stated as uncontrolled   . GERD (gastroesophageal reflux disease)   . Tobacco use disorder   . Personal history of peptic ulcer disease   . Other isolated or specific phobias   . Mini stroke     hx  . Other and unspecified hyperlipidemia   . Unspecified essential hypertension   . CAD (coronary artery disease)     Proximal RCA stent in 2005     Past Surgical History  Procedure Date  . Right coronary artery stent   . Left leg angiography  stenting of the left common iliac artery  . Hysterectomy (other)   . Left rotator cuff repair   . Left arm surgery   . Cardiac catheterization 2009    patent RCA stent. mild CAD otherwise.      Family History  Problem Relation Age of Onset  . Heart attack Mother   . Heart attack Father   . Coronary artery disease Brother      History   Social History  . Marital Status: Married    Spouse Name: N/A    Number of Children: N/A  . Years of Education: N/A   Occupational History  . Not on file.   Social History Main Topics  . Smoking status: Smoker, Current Status Unknown -- 1.5 packs/day  . Smokeless tobacco: Not on file   Comment: greater than 40 pack year history of tobacco use  . Alcohol Use: No  . Drug Use: No  . Sexually Active: Not on file   Other Topics Concern  . Not on file   Social History Narrative   [place] and is currently not working outside the home.        PHYSICAL EXAM   BP 119/78  Pulse 73  Ht 5\' 2"  (1.575 m)  Wt 148 lb (67.132 kg)  BMI 27.07 kg/m2  SpO2  97%  Constitutional: She is oriented to person, place, and time. She appears well-developed and well-nourished. No distress.  HENT: No nasal discharge.  Head: Normocephalic and atraumatic.  Eyes: Pupils are equal, round, and reactive to light. Right eye exhibits no discharge. Left eye exhibits no discharge.  Neck: Normal range of motion. Neck supple. No JVD present. No thyromegaly present.  Cardiovascular: Normal rate, regular rhythm, normal heart sounds and diminished distal pulses. Exam reveals no gallop and no friction rub.  No murmur heard.  Pulmonary/Chest: Effort normal and breath sounds normal. No stridor. No respiratory distress. She has no wheezes. She has no rales. She exhibits no tenderness.  Abdominal: Soft. Bowel sounds are normal. She exhibits no distension. There is no tenderness. There is no rebound and no guarding.  Musculoskeletal: Normal range of motion. She exhibits no edema and no tenderness.  Neurological: She is alert and oriented to person, place, and time. Coordination normal.  Skin: Skin is warm and dry. No rash noted. She is not diaphoretic. No erythema. No pallor.  Psychiatric: She has a normal mood and affect. Her behavior is normal. Judgment and thought content normal.     EKG: Normal sinus rhythm, incomplete right bundle branch block. No significant ST or T wave changes.   ASSESSMENT AND PLAN

## 2011-03-05 NOTE — Assessment & Plan Note (Signed)
She is having a typical claudication in the left lower extremity. She has known history of peripheral arterial disease status post iliac stenting. Will request an ABI evaluation.

## 2011-03-05 NOTE — Assessment & Plan Note (Signed)
The patient has chronic chest pain which mostly seems to be related to anxiety. There has been no change in the pattern over the last 6 months. Her ECG does not show any ischemic changes. Continue medical therapy for now. She will notify me if the intensity of chest pain worsen.

## 2011-03-05 NOTE — Patient Instructions (Signed)
Your physician wants you to follow-up in: 6 months You will receive a reminder letter in the mail two months in advance. If you don't receive a letter, please call our office to schedule the follow-up appointment.   Your physician has requested that you have a lower extremity arterial duplex. During this test, an ultrasound are used to evaluate arterial blood flow in the legs. Allow one hour for this exam. There are no restrictions or special instructions.

## 2011-03-26 ENCOUNTER — Encounter: Payer: Self-pay | Admitting: Cardiovascular Disease

## 2011-05-15 LAB — POCT I-STAT GLUCOSE: Operator id: 118031

## 2011-06-22 ENCOUNTER — Telehealth: Payer: Self-pay | Admitting: Cardiovascular Disease

## 2011-06-22 NOTE — Telephone Encounter (Signed)
Eber Jones with [facility] is requesting the patient's most recent EKG. Faxed to [phone]. Call back [phone]. [email or web address]

## 2011-06-23 ENCOUNTER — Other Ambulatory Visit: Payer: Self-pay | Admitting: Cardiology

## 2011-06-23 DIAGNOSIS — R0989 Other specified symptoms and signs involving the circulatory and respiratory systems: Secondary | ICD-10-CM

## 2011-06-24 ENCOUNTER — Other Ambulatory Visit (HOSPITAL_COMMUNITY): Payer: Self-pay | Admitting: Family Medicine

## 2011-06-24 ENCOUNTER — Encounter (INDEPENDENT_AMBULATORY_CARE_PROVIDER_SITE_OTHER): Payer: Medicare Other | Admitting: Cardiology

## 2011-06-24 ENCOUNTER — Ambulatory Visit (HOSPITAL_COMMUNITY): Payer: Medicare Other | Attending: Cardiology | Admitting: Radiology

## 2011-06-24 DIAGNOSIS — E119 Type 2 diabetes mellitus without complications: Secondary | ICD-10-CM | POA: Insufficient documentation

## 2011-06-24 DIAGNOSIS — J449 Chronic obstructive pulmonary disease, unspecified: Secondary | ICD-10-CM | POA: Insufficient documentation

## 2011-06-24 DIAGNOSIS — I079 Rheumatic tricuspid valve disease, unspecified: Secondary | ICD-10-CM | POA: Insufficient documentation

## 2011-06-24 DIAGNOSIS — I10 Essential (primary) hypertension: Secondary | ICD-10-CM | POA: Insufficient documentation

## 2011-06-24 DIAGNOSIS — R072 Precordial pain: Secondary | ICD-10-CM

## 2011-06-24 DIAGNOSIS — J4489 Other specified chronic obstructive pulmonary disease: Secondary | ICD-10-CM | POA: Insufficient documentation

## 2011-06-24 DIAGNOSIS — F17200 Nicotine dependence, unspecified, uncomplicated: Secondary | ICD-10-CM | POA: Insufficient documentation

## 2011-06-24 DIAGNOSIS — I059 Rheumatic mitral valve disease, unspecified: Secondary | ICD-10-CM | POA: Insufficient documentation

## 2011-06-24 DIAGNOSIS — I6529 Occlusion and stenosis of unspecified carotid artery: Secondary | ICD-10-CM

## 2011-06-24 DIAGNOSIS — G459 Transient cerebral ischemic attack, unspecified: Secondary | ICD-10-CM

## 2011-06-24 DIAGNOSIS — R0989 Other specified symptoms and signs involving the circulatory and respiratory systems: Secondary | ICD-10-CM

## 2011-06-25 ENCOUNTER — Encounter (HOSPITAL_COMMUNITY): Payer: Self-pay | Admitting: Family Medicine

## 2011-06-30 ENCOUNTER — Telehealth: Payer: Self-pay | Admitting: *Deleted

## 2011-06-30 NOTE — Telephone Encounter (Signed)
PT AWARE OF ECHO RESULTS./CY 

## 2011-08-26 DIAGNOSIS — K219 Gastro-esophageal reflux disease without esophagitis: Secondary | ICD-10-CM | POA: Diagnosis not present

## 2011-09-03 ENCOUNTER — Ambulatory Visit: Admitting: Cardiovascular Disease

## 2011-09-21 DIAGNOSIS — J0100 Acute maxillary sinusitis, unspecified: Secondary | ICD-10-CM | POA: Diagnosis not present

## 2011-09-21 DIAGNOSIS — Z09 Encounter for follow-up examination after completed treatment for conditions other than malignant neoplasm: Secondary | ICD-10-CM | POA: Diagnosis not present

## 2011-09-21 DIAGNOSIS — E559 Vitamin D deficiency, unspecified: Secondary | ICD-10-CM | POA: Diagnosis not present

## 2011-09-21 DIAGNOSIS — F17200 Nicotine dependence, unspecified, uncomplicated: Secondary | ICD-10-CM | POA: Diagnosis not present

## 2011-09-21 DIAGNOSIS — Z0000 Encounter for general adult medical examination without abnormal findings: Secondary | ICD-10-CM | POA: Diagnosis not present

## 2011-09-25 DIAGNOSIS — Z1231 Encounter for screening mammogram for malignant neoplasm of breast: Secondary | ICD-10-CM | POA: Diagnosis not present

## 2011-09-25 DIAGNOSIS — M949 Disorder of cartilage, unspecified: Secondary | ICD-10-CM | POA: Diagnosis not present

## 2011-09-25 DIAGNOSIS — Z09 Encounter for follow-up examination after completed treatment for conditions other than malignant neoplasm: Secondary | ICD-10-CM | POA: Diagnosis not present

## 2011-09-25 DIAGNOSIS — M899 Disorder of bone, unspecified: Secondary | ICD-10-CM | POA: Diagnosis not present

## 2011-09-29 DIAGNOSIS — I10 Essential (primary) hypertension: Secondary | ICD-10-CM | POA: Diagnosis not present

## 2011-09-29 DIAGNOSIS — E119 Type 2 diabetes mellitus without complications: Secondary | ICD-10-CM | POA: Diagnosis not present

## 2011-09-29 DIAGNOSIS — F329 Major depressive disorder, single episode, unspecified: Secondary | ICD-10-CM | POA: Diagnosis not present

## 2011-09-29 DIAGNOSIS — M949 Disorder of cartilage, unspecified: Secondary | ICD-10-CM | POA: Diagnosis not present

## 2011-09-29 DIAGNOSIS — E7800 Pure hypercholesterolemia, unspecified: Secondary | ICD-10-CM | POA: Diagnosis not present

## 2011-09-29 DIAGNOSIS — Z79899 Other long term (current) drug therapy: Secondary | ICD-10-CM | POA: Diagnosis not present

## 2011-09-29 DIAGNOSIS — I2510 Atherosclerotic heart disease of native coronary artery without angina pectoris: Secondary | ICD-10-CM | POA: Diagnosis not present

## 2011-09-29 DIAGNOSIS — E559 Vitamin D deficiency, unspecified: Secondary | ICD-10-CM | POA: Diagnosis not present

## 2011-09-29 DIAGNOSIS — M899 Disorder of bone, unspecified: Secondary | ICD-10-CM | POA: Diagnosis not present

## 2011-09-29 DIAGNOSIS — E782 Mixed hyperlipidemia: Secondary | ICD-10-CM | POA: Diagnosis not present

## 2011-10-22 DIAGNOSIS — R1310 Dysphagia, unspecified: Secondary | ICD-10-CM | POA: Diagnosis not present

## 2011-12-08 DIAGNOSIS — M545 Low back pain: Secondary | ICD-10-CM | POA: Diagnosis not present

## 2011-12-08 DIAGNOSIS — J209 Acute bronchitis, unspecified: Secondary | ICD-10-CM | POA: Diagnosis not present

## 2011-12-30 DIAGNOSIS — R1310 Dysphagia, unspecified: Secondary | ICD-10-CM | POA: Diagnosis not present

## 2012-01-01 DIAGNOSIS — F17200 Nicotine dependence, unspecified, uncomplicated: Secondary | ICD-10-CM | POA: Diagnosis not present

## 2012-01-01 DIAGNOSIS — F329 Major depressive disorder, single episode, unspecified: Secondary | ICD-10-CM | POA: Diagnosis not present

## 2012-01-01 DIAGNOSIS — E1149 Type 2 diabetes mellitus with other diabetic neurological complication: Secondary | ICD-10-CM | POA: Diagnosis not present

## 2012-01-01 DIAGNOSIS — E119 Type 2 diabetes mellitus without complications: Secondary | ICD-10-CM | POA: Diagnosis not present

## 2012-01-01 DIAGNOSIS — I2510 Atherosclerotic heart disease of native coronary artery without angina pectoris: Secondary | ICD-10-CM | POA: Diagnosis not present

## 2012-01-01 DIAGNOSIS — J0100 Acute maxillary sinusitis, unspecified: Secondary | ICD-10-CM | POA: Diagnosis not present

## 2012-01-01 DIAGNOSIS — I10 Essential (primary) hypertension: Secondary | ICD-10-CM | POA: Diagnosis not present

## 2012-01-01 DIAGNOSIS — E782 Mixed hyperlipidemia: Secondary | ICD-10-CM | POA: Diagnosis not present

## 2012-01-01 DIAGNOSIS — E7800 Pure hypercholesterolemia, unspecified: Secondary | ICD-10-CM | POA: Diagnosis not present

## 2012-01-26 DIAGNOSIS — E119 Type 2 diabetes mellitus without complications: Secondary | ICD-10-CM | POA: Diagnosis not present

## 2012-01-26 DIAGNOSIS — R1310 Dysphagia, unspecified: Secondary | ICD-10-CM | POA: Diagnosis not present

## 2012-01-26 DIAGNOSIS — K644 Residual hemorrhoidal skin tags: Secondary | ICD-10-CM | POA: Diagnosis not present

## 2012-01-26 DIAGNOSIS — K219 Gastro-esophageal reflux disease without esophagitis: Secondary | ICD-10-CM | POA: Diagnosis not present

## 2012-01-26 DIAGNOSIS — Z7982 Long term (current) use of aspirin: Secondary | ICD-10-CM | POA: Diagnosis not present

## 2012-01-26 DIAGNOSIS — Z1211 Encounter for screening for malignant neoplasm of colon: Secondary | ICD-10-CM | POA: Diagnosis not present

## 2012-01-26 DIAGNOSIS — I519 Heart disease, unspecified: Secondary | ICD-10-CM | POA: Diagnosis not present

## 2012-01-26 DIAGNOSIS — K5900 Constipation, unspecified: Secondary | ICD-10-CM | POA: Diagnosis not present

## 2012-01-26 DIAGNOSIS — J45909 Unspecified asthma, uncomplicated: Secondary | ICD-10-CM | POA: Diagnosis not present

## 2012-01-26 DIAGNOSIS — J438 Other emphysema: Secondary | ICD-10-CM | POA: Diagnosis not present

## 2012-01-26 DIAGNOSIS — D126 Benign neoplasm of colon, unspecified: Secondary | ICD-10-CM | POA: Diagnosis not present

## 2012-01-26 DIAGNOSIS — F17200 Nicotine dependence, unspecified, uncomplicated: Secondary | ICD-10-CM | POA: Diagnosis not present

## 2012-01-26 DIAGNOSIS — Z7902 Long term (current) use of antithrombotics/antiplatelets: Secondary | ICD-10-CM | POA: Diagnosis not present

## 2012-01-26 DIAGNOSIS — I10 Essential (primary) hypertension: Secondary | ICD-10-CM | POA: Diagnosis not present

## 2012-01-26 DIAGNOSIS — E7800 Pure hypercholesterolemia, unspecified: Secondary | ICD-10-CM | POA: Diagnosis not present

## 2012-01-26 DIAGNOSIS — Z79899 Other long term (current) drug therapy: Secondary | ICD-10-CM | POA: Diagnosis not present

## 2012-03-07 DIAGNOSIS — R1310 Dysphagia, unspecified: Secondary | ICD-10-CM | POA: Diagnosis not present

## 2012-04-08 ENCOUNTER — Emergency Department (HOSPITAL_COMMUNITY): Payer: Medicare Other

## 2012-04-08 ENCOUNTER — Inpatient Hospital Stay (HOSPITAL_COMMUNITY): Payer: Medicare Other

## 2012-04-08 ENCOUNTER — Encounter (HOSPITAL_COMMUNITY): Payer: Self-pay | Admitting: Emergency Medicine

## 2012-04-08 ENCOUNTER — Inpatient Hospital Stay (HOSPITAL_COMMUNITY)
Admission: EM | Admit: 2012-04-08 | Discharge: 2012-04-09 | DRG: 313 | Disposition: A | Discharge status: Home / Self Care | Payer: Medicare Other | Attending: Cardiology | Admitting: Cardiology

## 2012-04-08 DIAGNOSIS — F3289 Other specified depressive episodes: Secondary | ICD-10-CM | POA: Diagnosis not present

## 2012-04-08 DIAGNOSIS — Z7982 Long term (current) use of aspirin: Secondary | ICD-10-CM

## 2012-04-08 DIAGNOSIS — R0602 Shortness of breath: Secondary | ICD-10-CM

## 2012-04-08 DIAGNOSIS — R079 Chest pain, unspecified: Principal | ICD-10-CM

## 2012-04-08 DIAGNOSIS — Z8673 Personal history of transient ischemic attack (TIA), and cerebral infarction without residual deficits: Secondary | ICD-10-CM

## 2012-04-08 DIAGNOSIS — I498 Other specified cardiac arrhythmias: Secondary | ICD-10-CM | POA: Diagnosis present

## 2012-04-08 DIAGNOSIS — E119 Type 2 diabetes mellitus without complications: Secondary | ICD-10-CM | POA: Diagnosis present

## 2012-04-08 DIAGNOSIS — F17200 Nicotine dependence, unspecified, uncomplicated: Secondary | ICD-10-CM | POA: Diagnosis not present

## 2012-04-08 DIAGNOSIS — E1149 Type 2 diabetes mellitus with other diabetic neurological complication: Secondary | ICD-10-CM | POA: Diagnosis not present

## 2012-04-08 DIAGNOSIS — I209 Angina pectoris, unspecified: Secondary | ICD-10-CM | POA: Diagnosis not present

## 2012-04-08 DIAGNOSIS — I200 Unstable angina: Secondary | ICD-10-CM | POA: Diagnosis not present

## 2012-04-08 DIAGNOSIS — I10 Essential (primary) hypertension: Secondary | ICD-10-CM | POA: Diagnosis present

## 2012-04-08 DIAGNOSIS — I208 Other forms of angina pectoris: Secondary | ICD-10-CM

## 2012-04-08 DIAGNOSIS — R911 Solitary pulmonary nodule: Secondary | ICD-10-CM | POA: Diagnosis present

## 2012-04-08 DIAGNOSIS — F329 Major depressive disorder, single episode, unspecified: Secondary | ICD-10-CM | POA: Diagnosis not present

## 2012-04-08 DIAGNOSIS — I2510 Atherosclerotic heart disease of native coronary artery without angina pectoris: Secondary | ICD-10-CM | POA: Diagnosis present

## 2012-04-08 DIAGNOSIS — E782 Mixed hyperlipidemia: Secondary | ICD-10-CM | POA: Diagnosis not present

## 2012-04-08 DIAGNOSIS — R072 Precordial pain: Secondary | ICD-10-CM | POA: Diagnosis not present

## 2012-04-08 HISTORY — DX: Polyp of colon: K63.5

## 2012-04-08 LAB — BASIC METABOLIC PANEL
BUN: 16 mg/dL (ref 6–23)
CO2: 26 mEq/L (ref 19–32)
Calcium: 9.9 mg/dL (ref 8.4–10.5)
Creatinine, Ser: 0.87 mg/dL (ref 0.50–1.10)
GFR calc non Af Amer: 70 mL/min — ABNORMAL LOW (ref >90)
Glucose, Bld: 137 mg/dL — ABNORMAL HIGH (ref 70–99)

## 2012-04-08 LAB — CBC
HCT: 39.4 % (ref 36.0–46.0)
Hemoglobin: 13.2 g/dL (ref 12.0–15.0)
MCH: 30.5 pg (ref 26.0–34.0)
MCHC: 33.5 g/dL (ref 30.0–36.0)
MCV: 91 fL (ref 78.0–100.0)
RBC: 4.33 MIL/uL (ref 3.87–5.11)

## 2012-04-08 LAB — GLUCOSE, CAPILLARY

## 2012-04-08 LAB — POCT I-STAT TROPONIN I: Troponin i, poc: 0 ng/mL (ref 0.00–0.08)

## 2012-04-08 LAB — PROTIME-INR: Prothrombin Time: 14 seconds (ref 11.6–15.2)

## 2012-04-08 MED ORDER — ZOLPIDEM TARTRATE 5 MG PO TABS
10.0000 mg | ORAL_TABLET | Freq: Every evening | ORAL | Status: DC | PRN
  Administered 2012-04-08: 10 mg via ORAL
  Filled 2012-04-08: qty 2

## 2012-04-08 MED ORDER — CLOPIDOGREL BISULFATE 75 MG PO TABS
75.0000 mg | ORAL_TABLET | Freq: Every day | ORAL | Status: DC
  Administered 2012-04-08 – 2012-04-09 (×2): 75 mg via ORAL
  Filled 2012-04-08 (×2): qty 1

## 2012-04-08 MED ORDER — QUETIAPINE FUMARATE 50 MG PO TABS
50.0000 mg | ORAL_TABLET | Freq: Every day | ORAL | Status: DC
  Administered 2012-04-08: 50 mg via ORAL
  Filled 2012-04-08 (×2): qty 1

## 2012-04-08 MED ORDER — LORAZEPAM 0.5 MG PO TABS: 0.5000 mg | ORAL_TABLET | Freq: Every day | ORAL | Status: DC | PRN

## 2012-04-08 MED ORDER — HEPARIN (PORCINE) IN NACL 100-0.45 UNIT/ML-% IJ SOLN
1500.0000 [IU]/h | INTRAMUSCULAR | Status: DC
  Administered 2012-04-08: 1000 [IU]/h via INTRAVENOUS
  Filled 2012-04-08 (×2): qty 250

## 2012-04-08 MED ORDER — SODIUM CHLORIDE 0.9 % IV SOLN
250.0000 mL | INTRAVENOUS | Status: DC | PRN
  Administered 2012-04-08: 250 mL via INTRAVENOUS

## 2012-04-08 MED ORDER — ATORVASTATIN CALCIUM 40 MG PO TABS
40.0000 mg | ORAL_TABLET | Freq: Every day | ORAL | Status: DC
  Administered 2012-04-08: 40 mg via ORAL
  Filled 2012-04-08 (×2): qty 1

## 2012-04-08 MED ORDER — DULOXETINE HCL 60 MG PO CPEP
120.0000 mg | ORAL_CAPSULE | Freq: Every day | ORAL | Status: DC
  Administered 2012-04-08: 120 mg via ORAL
  Filled 2012-04-08 (×2): qty 2

## 2012-04-08 MED ORDER — HYDROCHLOROTHIAZIDE 25 MG PO TABS
25.0000 mg | ORAL_TABLET | Freq: Every day | ORAL | Status: DC
  Administered 2012-04-09: 25 mg via ORAL
  Filled 2012-04-08: qty 1

## 2012-04-08 MED ORDER — PANTOPRAZOLE SODIUM 40 MG PO TBEC
40.0000 mg | DELAYED_RELEASE_TABLET | Freq: Every day | ORAL | Status: DC
  Administered 2012-04-09: 40 mg via ORAL
  Filled 2012-04-08: qty 1

## 2012-04-08 MED ORDER — FENOFIBRATE 160 MG PO TABS
160.0000 mg | ORAL_TABLET | Freq: Every day | ORAL | Status: DC
  Administered 2012-04-08: 160 mg via ORAL
  Filled 2012-04-08 (×2): qty 1

## 2012-04-08 MED ORDER — INSULIN ASPART 100 UNIT/ML ~~LOC~~ SOLN
0.0000 [IU] | Freq: Three times a day (TID) | SUBCUTANEOUS | Status: DC
  Administered 2012-04-09: 1 [IU] via SUBCUTANEOUS

## 2012-04-08 MED ORDER — NITROGLYCERIN 0.4 MG SL SUBL: 0.4000 mg | SUBLINGUAL_TABLET | SUBLINGUAL | Status: DC | PRN

## 2012-04-08 MED ORDER — ISOSORBIDE DINITRATE ER 40 MG PO TBCR
40.0000 mg | EXTENDED_RELEASE_TABLET | Freq: Two times a day (BID) | ORAL | Status: DC
  Filled 2012-04-08: qty 1

## 2012-04-08 MED ORDER — ISOSORBIDE DINITRATE 20 MG PO TABS
40.0000 mg | ORAL_TABLET | Freq: Two times a day (BID) | ORAL | Status: DC
  Administered 2012-04-08 – 2012-04-09 (×2): 40 mg via ORAL
  Filled 2012-04-08 (×3): qty 2

## 2012-04-08 MED ORDER — LINAGLIPTIN 5 MG PO TABS
5.0000 mg | ORAL_TABLET | Freq: Every day | ORAL | Status: DC
  Administered 2012-04-09: 5 mg via ORAL
  Filled 2012-04-08: qty 1

## 2012-04-08 MED ORDER — ONDANSETRON HCL 4 MG/2ML IJ SOLN: 4.0000 mg | Freq: Four times a day (QID) | INTRAMUSCULAR | Status: DC | PRN

## 2012-04-08 MED ORDER — POTASSIUM CHLORIDE CRYS ER 10 MEQ PO TBCR
10.0000 meq | EXTENDED_RELEASE_TABLET | Freq: Every day | ORAL | Status: DC
  Administered 2012-04-08 – 2012-04-09 (×2): 10 meq via ORAL
  Filled 2012-04-08 (×2): qty 1

## 2012-04-08 MED ORDER — FAMOTIDINE 20 MG PO TABS
20.0000 mg | ORAL_TABLET | Freq: Two times a day (BID) | ORAL | Status: DC
  Administered 2012-04-09: 20 mg via ORAL
  Filled 2012-04-08 (×3): qty 1

## 2012-04-08 MED ORDER — ATENOLOL 50 MG PO TABS
50.0000 mg | ORAL_TABLET | Freq: Two times a day (BID) | ORAL | Status: DC
  Filled 2012-04-08 (×2): qty 1

## 2012-04-08 MED ORDER — SODIUM CHLORIDE 0.9 % IJ SOLN: 3.0000 mL | INTRAMUSCULAR | Status: DC | PRN

## 2012-04-08 MED ORDER — HEPARIN BOLUS VIA INFUSION
4000.0000 [IU] | Freq: Once | INTRAVENOUS | Status: AC
  Administered 2012-04-08: 4000 [IU] via INTRAVENOUS

## 2012-04-08 MED ORDER — BUPROPION HCL ER (SR) 150 MG PO TB12
150.0000 mg | ORAL_TABLET | Freq: Two times a day (BID) | ORAL | Status: DC
  Administered 2012-04-08 – 2012-04-09 (×2): 150 mg via ORAL
  Filled 2012-04-08 (×3): qty 1

## 2012-04-08 MED ORDER — ACETAMINOPHEN 325 MG PO TABS
650.0000 mg | ORAL_TABLET | ORAL | Status: DC | PRN
  Administered 2012-04-08: 650 mg via ORAL
  Filled 2012-04-08: qty 2

## 2012-04-08 MED ORDER — IOHEXOL 350 MG/ML SOLN
100.0000 mL | Freq: Once | INTRAVENOUS | Status: AC | PRN
  Administered 2012-04-08: 100 mL via INTRAVENOUS

## 2012-04-08 MED ORDER — SODIUM CHLORIDE 0.9 % IJ SOLN
3.0000 mL | Freq: Two times a day (BID) | INTRAMUSCULAR | Status: DC
  Administered 2012-04-09: 3 mL via INTRAVENOUS

## 2012-04-08 MED ORDER — NITROGLYCERIN 0.4 MG SL SUBL
0.4000 mg | SUBLINGUAL_TABLET | SUBLINGUAL | Status: DC | PRN
  Administered 2012-04-08 (×2): 0.4 mg via SUBLINGUAL
  Filled 2012-04-08 (×2): qty 25

## 2012-04-08 MED ORDER — IRBESARTAN 300 MG PO TABS
300.0000 mg | ORAL_TABLET | Freq: Every day | ORAL | Status: DC
  Administered 2012-04-09: 300 mg via ORAL
  Filled 2012-04-08 (×2): qty 1

## 2012-04-08 MED ORDER — ASPIRIN EC 81 MG PO TBEC
81.0000 mg | DELAYED_RELEASE_TABLET | Freq: Every day | ORAL | Status: DC
  Administered 2012-04-09: 81 mg via ORAL
  Filled 2012-04-08: qty 1

## 2012-04-08 NOTE — Progress Notes (Signed)
ANTICOAGULATION CONSULT NOTE - Initial Consult  Pharmacy Consult for UF Indication: chest pain r/o PE vs USAP  Allergies  Allergen Reactions  . Celecoxib Hives  . Vioxx (Rofecoxib) Hives    Patient Measurements: Height: 5' 1.81" (157 cm) Weight: 147 lb 11.3 oz (67 kg) IBW/kg (Calculated) : 49.67  Heparin Dosing Weight: 62kg  Vital Signs: Temp: 97.8 F (36.6 C) (08/30 1110) Temp src: Oral (08/30 1110) BP: 134/103 mmHg (08/30 1504) Pulse Rate: 94  (08/30 1504)  Labs:  Basename 04/08/12 1455 04/08/12 1123  HGB -- 13.2  HCT -- 39.4  PLT -- 227  APTT -- --  LABPROT -- --  INR -- --  HEPARINUNFRC -- --  CREATININE -- 0.87  CKTOTAL -- --  CKMB -- --  TROPONINI <0.30 --    Estimated Creatinine Clearance: 60.7 ml/min (by C-G formula based on Cr of 0.87).   Medical History: Past Medical History  Diagnosis Date  . Peripheral vascular disease, unspecified     a. s/p L common femoral & left illiac stenting previously (patent by angio in 2007). - ABI 06/2011 normal. b. Carotid US 0-39% BICA 06/2011 (for f/u 2 yrs)  . Type II or unspecified type diabetes mellitus without mention of complication, not stated as uncontrolled   . GERD (gastroesophageal reflux disease)   . Tobacco use disorder   . Personal history of peptic ulcer disease     Remotely.  . Other isolated or specific phobias   . Mini stroke     History of, in 2005 (before she was diagnosed with CAD).  . Other and unspecified hyperlipidemia   . Unspecified essential hypertension   . CAD (coronary artery disease)     a. s/p 2 CoStar DES to RCA 06/2004. b, Last cath 2009 with patent stents (otherwise diffuse nonobstructive diseasE).   . Colon polyps     a. s/p removal 01/2012 - was told one might be cancerous but was not diagnosed with colon cancer - plan is for f/u colonoscopy in 5 yrs.    Medications:   (Not in a hospital admission)  Assessment: 61 y/o female patient with significant cardiac history  admitted with chest pain and tachycardia requiring anticoagulation for PE vs MI. CE neg x1, EKG sinus tach with no  ST changes.  Goal of Therapy:  Heparin level 0.3-0.7 units/ml Monitor platelets by anticoagulation protocol: Yes   Plan:  Heparin 4000 unit IV bolus followed by infusion at 1000 units/hr. Check 6 hour heparin level with daily cbc and heparin level.  Verlene Mayer, PharmD, BCPS Pager [phone] 04/08/2012,5:07 PM

## 2012-04-08 NOTE — ED Provider Notes (Addendum)
History     CSN: [id number]  Arrival date & time 04/08/12  1101   First MD Initiated Contact with Patient 04/08/12 1138      Chief Complaint  Patient presents with  . Chest Pain    (Consider location/radiation/quality/duration/timing/severity/associated sxs/prior treatment) HPI Comments: PMHx is as following per recent visit:   1. Coronary artery disease status post RCA angioplasty and stent      placement in 2005.  Most recent cardiac catheterization was done in      2009, which showed patent stents with no obstructive disease      elsewhere.  2. Peripheral vascular disease status post iliac stenting.  3. Tobacco use.  4. Hypertension.  5. Hyperlipidemia.  6. Gastroesophageal reflux disease.  7. Type 2 diabetes.  8. Peptic ulcer disease.  9. COPD.  Pt comes in with cc of chest pain that started about a week ago, while she was helping her husband with some yard work. The chest pain is located on the left side, radiates to the shoulder, and is relieved with rest and nitro. Pt has been having exertioanl chest pain since last week. No associated sob, nausea, diaphoresis. Pt saw her pcp today, who advised patient come to the ER. Last stress per patient was 1-2 yrs ago, and was normal.  Patient is a 61 y.o. female presenting with chest pain. The history is provided by the patient and medical records.  Chest Pain Pertinent negatives for primary symptoms include no shortness of breath, no cough, no wheezing, no abdominal pain, no nausea and no vomiting.     Past Medical History  Diagnosis Date  . Peripheral vascular disease, unspecified     left illiac stenting  . Personal history of unspecified circulatory disease   . Type II or unspecified type diabetes mellitus without mention of complication, not stated as uncontrolled   . GERD (gastroesophageal reflux disease)   . Tobacco use disorder   . Personal history of peptic ulcer disease   . Other isolated or specific phobias   .  Mini stroke     hx  . Other and unspecified hyperlipidemia   . Unspecified essential hypertension   . CAD (coronary artery disease)     Proximal RCA stent in 2005    Past Surgical History  Procedure Date  . Right coronary artery stent   . Left leg angiography     stenting of the left common iliac artery  . Hysterectomy (other)   . Left rotator cuff repair   . Left arm surgery   . Cardiac catheterization 2009    patent RCA stent. mild CAD otherwise.     Family History  Problem Relation Age of Onset  . Heart attack Mother   . Heart attack Father   . Coronary artery disease Brother     History  Substance Use Topics  . Smoking status: Smoker, Current Status Unknown -- 1.5 packs/day  . Smokeless tobacco: Not on file   Comment: greater than 40 pack year history of tobacco use  . Alcohol Use: No    OB History    Grav Para Term Preterm Abortions TAB SAB Ect Mult Living                  Review of Systems  Constitutional: Negative for activity change.  HENT: Negative for facial swelling and neck pain.   Respiratory: Negative for cough, shortness of breath and wheezing.   Cardiovascular: Positive for chest pain.  Gastrointestinal: Negative  for nausea, vomiting, abdominal pain, diarrhea, constipation, blood in stool and abdominal distention.  Genitourinary: Negative for hematuria and difficulty urinating.  Skin: Negative for color change.  Neurological: Negative for speech difficulty.  Hematological: Does not bruise/bleed easily.  Psychiatric/Behavioral: Negative for confusion.    Allergies  Celecoxib and Vioxx  Home Medications   Current Outpatient Rx  Name Route Sig Dispense Refill  . ASPIRIN EC 81 MG PO TBEC Oral Take 81 mg by mouth daily.    . ATENOLOL 50 MG PO TABS Oral Take 50 mg by mouth 2 (two) times daily. Morning and noon    . BUPROPION HCL ER (SR) 150 MG PO TB12 Oral Take 150 mg by mouth 2 (two) times daily.    Marland Kitchen CLOPIDOGREL BISULFATE 75 MG PO TABS Oral  Take 75 mg by mouth daily at 12 noon.     . DULOXETINE HCL 60 MG PO CPEP Oral Take 120 mg by mouth at bedtime.     Marland Kitchen ESOMEPRAZOLE MAGNESIUM 40 MG PO CPDR Oral Take 40 mg by mouth daily.     Marland Kitchen FAMOTIDINE 20 MG PO TABS Oral Take 20 mg by mouth 2 (two) times daily.      . FENOFIBRATE 145 MG PO TABS Oral Take 145 mg by mouth at bedtime.     Marland Kitchen HYDROCHLOROTHIAZIDE 25 MG PO TABS Oral Take 25 mg by mouth daily.      . ISOSORBIDE DINITRATE ER 40 MG PO TBCR Oral Take 40 mg by mouth 2 (two) times daily.     Marland Kitchen LIDOCAINE 5 % EX PTCH Transdermal Place 1 patch onto the skin daily as needed. For back pain. 12 hours on 12 hours off    . LORAZEPAM 0.5 MG PO TABS Oral Take 0.5 mg by mouth daily as needed. For anxiety    . METFORMIN HCL 1000 MG PO TABS Oral Take 1,000 mg by mouth 2 (two) times daily with a meal.     . NITROGLYCERIN 0.4 MG SL SUBL Sublingual Place 0.4 mg under the tongue every 5 (five) minutes as needed. For chest pain    . POTASSIUM CHLORIDE ER 10 MEQ PO CPCR Oral Take 10 mEq by mouth daily at 12 noon.     . QUETIAPINE FUMARATE 50 MG PO TABS Oral Take 50 mg by mouth at bedtime.      Marland Kitchen ROSUVASTATIN CALCIUM 20 MG PO TABS Oral Take 20 mg by mouth at bedtime.    Marland Kitchen SITAGLIPTIN PHOSPHATE 100 MG PO TABS Oral Take 100 mg by mouth daily.    Marland Kitchen VALSARTAN 320 MG PO TABS Oral Take 320 mg by mouth daily at 12 noon.     Marland Kitchen ZOLPIDEM TARTRATE 10 MG PO TABS Oral Take 10 mg by mouth at bedtime as needed. For sleep      BP 162/98  Pulse 102  Temp 97.8 F (36.6 C) (Oral)  Resp 24  SpO2 98%  Physical Exam  Nursing note and vitals reviewed. Constitutional: She is oriented to person, place, and time. She appears well-developed and well-nourished.  HENT:  Head: Normocephalic and atraumatic.  Eyes: EOM are normal. Pupils are equal, round, and reactive to light.  Neck: Neck supple.  Cardiovascular: Normal rate, regular rhythm and normal heart sounds.   No murmur heard. Pulmonary/Chest: Effort normal. No  respiratory distress.  Abdominal: Soft. She exhibits no distension. There is no tenderness. There is no rebound and no guarding.  Neurological: She is alert and oriented to person, place, and  time.  Skin: Skin is warm and dry.    ED Course  Procedures (including critical care time)   Labs Reviewed  POCT I-STAT TROPONIN I  CBC  BASIC METABOLIC PANEL  PRO B NATRIURETIC PEPTIDE   No results found.   No diagnosis found.    MDM   Date: 04/08/2012  Rate: 103  Rhythm: sinus tachycardia  QRS Axis: normal  Intervals: QT prolonged  ST/T Wave abnormalities: nonspecific ST/T changes  Conduction Disutrbances:right bundle branch block  Narrative Interpretation:   Old EKG Reviewed: unchanged  Differential diagnosis includes: ACS syndrome CHF exacerbation Valvular disorder Myocarditis Pericarditis Pericardial effusion Pneumonia Pleural effusion Pulmonary edema PE Anemia Musculoskeletal pain  Pt with known CAD comes in with chest pain that is typical, exertional, and consistent with stable angina. EKG shows no acute findings, and is unchanged from before. Cardiology consultants will be informed. No concerns for PE, dissection at this time.   Derwood Kaplan, MD 04/08/12 1451  Derwood Kaplan, MD 04/08/12 1610

## 2012-04-08 NOTE — ED Notes (Signed)
Patient transported to CT 

## 2012-04-08 NOTE — H&P (Signed)
History and Physical  Patient ID: Lindsay Brooks MRN: [id number], DOB: 07-08-1951 Date of Encounter: 04/08/2012, 3:53 PM Primary Physician: Blane Ohara, MD Primary Cardiologist: Kirke Corin  Chief Complaint: chest pain, SOB  HPI: Lindsay Brooks is a 61 y/o F with hx of CAD s/p DES x 2 to RCA in 2005, PVD s/p common femoral & iliac stenting, DM, HTN, ongoing tobacco abuse who presented to [facility] with complaints of chest pain and SOB. She went for her usual 3 month diabetic checkup today and her PCP became concerned when she mentioned these complaints, thus sent her to the ER. She does report some degree of chronic DOE since 2005 but has had acute worsening of symptoms over the last 4 weeks. She describes exertional chest tightness/pressure that is relieved with rest. Initially it was only with higher levels of activities (like helping her husband move a lawn mower out of a ditch - "I felt like my chest was going to cave in") but now it is occurring even just walking to the mailbox. She endorses feeling hot, sweaty, dizzy, and sometimes nauseated with this. It reminds her of what her symptoms were prior to needing a stent in 2005. She also reports significant symptoms with emotional stress (has had some family stress with her granddaughter).  She endorses an 11-hour car ride to [place] in May of this year. She denies any personal hx of blood clot, recent bed rest, injury, dehydration, viral illness, fevers, chills, LEE, syncope, orthopnea, PND, BRPBR, melena or hematemesis. She reports that since 2005 she has actually lost about 30 lbs. In the last 6 months, she's lost 8. She thinks she may be eating less because her granddaughter moved out last year. She had a colonoscopy in June 2013 which she reports she had 2 polyps removed, 1 possibly cancerous but was told she did not have colon cancer and was recommended to have a repeat colonscopy in 5 years. She states she has a "chronic smoker's cough" which  has felt heavier lately. She had a coughing fit so severe this morning that she vomited her breakfast. She is currently chest pain free and comfortable.  In ER, she is in sinus tach with HR 100-110 with somewhat elevated BP particularly diastolic - last pressure 153/77. She reports that she took her morning medicines today and has been compliant. CXR shows no acute findings, normal heart size. EKG shows sinus tach 102 bpm, atypical RBBB, nonspecific TW changes, and inferolateral Q waves. Tracing appears somewhat similar to 12-Mar-2011 except for more pronounced Q waves.  Labwork thus far has been unrevealing with normal CBC, negative troponin, mostly normal BMET except CBG 137, pBNP 138. She received 325mg  of ASA prior to arrival.  Past Medical History  Diagnosis Date  . Peripheral vascular disease, unspecified     a. s/p L common femoral & left illiac stenting previously (patent by angio in 2007). - ABI 06/2011 normal. b. Carotid US 0-39% BICA 06/2011 (for f/u 2 yrs)  . Type II or unspecified type diabetes mellitus without mention of complication, not stated as uncontrolled   . GERD (gastroesophageal reflux disease)   . Tobacco use disorder   . Personal history of peptic ulcer disease     Remotely.  . Other isolated or specific phobias   . Mini stroke     History of, in 2005 (before she was diagnosed with CAD).  . Other and unspecified hyperlipidemia   . Unspecified essential hypertension   . CAD (coronary artery  disease)     a. s/p 2 CoStar DES to RCA 06/2004. b, Last cath 2009 with patent stents (otherwise diffuse nonobstructive diseasE).   . Colon polyps     a. s/p removal 01/2012 - was told one might be cancerous but was not diagnosed with colon cancer - plan is for f/u colonoscopy in 5 yrs.     Most Recent Cardiac Studies: 07/2009 Study Conclusions - Left ventricle: The cavity size was normal. There was mild   concentric hypertrophy. Systolic function was normal. The   estimated ejection  fraction was in the range of 55% to 60%. Wall   motion was normal; there were no regional wall motion abnormalities. Doppler parameters are consistent with abnormal left ventricular relaxation (grade 1 diastolic dysfunction). - Mitral valve: Mild regurgitation.  Cardiac Cath 07/2008 (also had interim cath in 2007) FINDINGS: Aortic pressure 126/61 with the mean of 93, left ventricular  pressure 130/22.  Left ventriculography: LV systolic function is normal. The LVEF is  65%. There is no mitral regurgitation.  Coronary angiography, left mainstem: The left mainstem is short. It's  left vein has mild diffuse stenosis, but no significant obstructive  disease. The left main bifurcates in the LAD and left circumflex. The  LAD is a large caliber vessel proximally. It tapers after the first  diagonal branch. The first diagonal branch is a large vessel that  covers moderately large territory. The LAD does not quite reach the LV  apex. There is no significant obstructive disease in the LAD or  diagonal.  Left circumflex has an ostial 40% narrowing that courses down and  supplies two OM branches without any significant stenosis.  Right coronary artery, the right coronary artery has a mild ostial  lesion at the range of 30%. Further down in the proximal vessel, there  is a 30-40% stenosis leading into a stented segment that is widely  patent. There is minimal restenosis in the stent of approximately 10-  20%. Beyond the distal edge of the stent there is a 40% stenosis  distally, the vessel supplies a PDA branch. A large acute marginal  branch and two posterolaterals, there is no significant stenosis in the  distal RCA branches.  ASSESSMENT:  1. Patent right coronary artery stents.  2. Diffuse nonobstructive coronary artery disease.  3. Normal left ventricular function.  PLAN: Recommend continued medical therapy. Suspect noncardiac chest  pain.   Surgical History:  Past Surgical History    Procedure Date  . Right coronary artery stent   . Left leg angiography     stenting of the left common iliac artery  . Hysterectomy (other)   . Left rotator cuff repair   . Left arm surgery   . Cardiac catheterization 2009    patent RCA stent. mild CAD otherwise.      Home Meds: Prior to Admission medications   Medication Sig Start Date End Date Taking? Authorizing Provider  aspirin EC 81 MG tablet Take 81 mg by mouth daily.   Yes Historical Provider, MD  atenolol (TENORMIN) 50 MG tablet Take 50 mg by mouth 2 (two) times daily. Morning and noon   Yes Historical Provider, MD  buPROPion (WELLBUTRIN SR) 150 MG 12 hr tablet Take 150 mg by mouth 2 (two) times daily.   Yes Historical Provider, MD  clopidogrel (PLAVIX) 75 MG tablet Take 75 mg by mouth daily at 12 noon.    Yes Historical Provider, MD  DULoxetine (CYMBALTA) 60 MG capsule Take 120 mg by  mouth at bedtime.    Yes Historical Provider, MD  esomeprazole (NEXIUM) 40 MG capsule Take 40 mg by mouth daily.    Yes Historical Provider, MD  famotidine (PEPCID) 20 MG tablet Take 20 mg by mouth 2 (two) times daily.     Yes Historical Provider, MD  fenofibrate (TRICOR) 145 MG tablet Take 145 mg by mouth at bedtime.    Yes Historical Provider, MD  hydrochlorothiazide 25 MG tablet Take 25 mg by mouth daily.     Yes Historical Provider, MD  isosorbide dinitrate (ISOCHRON) 40 MG CR tablet Take 40 mg by mouth 2 (two) times daily.    Yes Historical Provider, MD  lidocaine (LIDODERM) 5 % Place 1 patch onto the skin daily as needed. For back pain. 12 hours on 12 hours off   Yes Historical Provider, MD  LORazepam (ATIVAN) 0.5 MG tablet Take 0.5 mg by mouth daily as needed. For anxiety   Yes Historical Provider, MD  metFORMIN (GLUCOPHAGE) 1000 MG tablet Take 1,000 mg by mouth 2 (two) times daily with a meal.    Yes Historical Provider, MD  nitroGLYCERIN (NITROSTAT) 0.4 MG SL tablet Place 0.4 mg under the tongue every 5 (five) minutes as needed. For chest  pain   Yes Historical Provider, MD  potassium chloride (MICRO-K) 10 MEQ CR capsule Take 10 mEq by mouth daily at 12 noon.    Yes Historical Provider, MD  QUEtiapine (SEROQUEL) 50 MG tablet Take 50 mg by mouth at bedtime.     Yes Historical Provider, MD  rosuvastatin (CRESTOR) 20 MG tablet Take 20 mg by mouth at bedtime.   Yes Historical Provider, MD  sitaGLIPtin (JANUVIA) 100 MG tablet Take 100 mg by mouth daily.   Yes Historical Provider, MD  valsartan (DIOVAN) 320 MG tablet Take 320 mg by mouth daily at 12 noon.    Yes Historical Provider, MD  zolpidem (AMBIEN) 10 MG tablet Take 10 mg by mouth at bedtime as needed. For sleep   Yes Historical Provider, MD    Allergies:  Allergies  Allergen Reactions  . Celecoxib Hives  . Vioxx (Rofecoxib) Hives    History   Social History  . Marital Status: Married    Spouse Name: N/A    Number of Children: N/A  . Years of Education: N/A   Occupational History  . Not on file.   Social History Main Topics  . Smoking status: Smoker, Current Status Unknown -- 1.5 packs/day  . Smokeless tobacco: Not on file   Comment: greater than 40 pack year history of tobacco use  . Alcohol Use: No  . Drug Use: No  . Sexually Active: Not on file   Other Topics Concern  . Not on file   Social History Narrative   [place], and is currently not working outside the home.      Family History  Problem Relation Age of Onset  . Heart attack Mother   . Heart attack Father   . Coronary artery disease Brother     Review of Systems: General: negative for chills, fever, night sweats. See above Cardiovascular: see above Dermatological: negative for rash Respiratory: no wheezing. +chronic cough Urologic: negative for hematuria Abdominal: no diarrhea, bright red blood per rectum, melena, or hematemesis. Has had nausea, vomiting today. Neurologic: negative for visual changes, syncope. Feels dizzy when she has chest pain. All other  systems reviewed and are otherwise negative except as noted above.  Labs:   Lab Results  Component Value Date   WBC 8.5 04/08/2012   HGB 13.2 04/08/2012   HCT 39.4 04/08/2012   MCV 91.0 04/08/2012   PLT 227 04/08/2012     Lab 04/08/12 1123  NA 143  K 3.5  CL 103  CO2 26  BUN 16  CREATININE 0.87  CALCIUM 9.9  PROT --  BILITOT --  ALKPHOS --  ALT --  AST --  GLUCOSE 137*   Poc troponin neg  Radiology/Studies:  Dg Chest 2 View8/30/2013  *RADIOLOGY REPORT*  Clinical Data: Chest pain.  Shortness of breath.  Diabetes. Hypertension.  Tobacco use.  CHEST - 2 VIEW  Comparison: 04/12/2008  Findings: Stable lingular scarring noted.  Heart size is within normal limits.  The lungs are otherwise clear.  No pleural effusion observed.  IMPRESSION: 1.  Stable lingular scarring.  No acute findings.   Original Report Authenticated By: Dellia Cloud, M.D.     EKG: sinus tach 102 bpm, atypical RBBB, nonspecific TW changes, and inferolateral Q waves. No acute ST changes. Tracing appears somewhat similar to March 20, 2011 except for more pronounced Q waves.   Physical Exam: Blood pressure 134/103, pulse 94, temperature 97.8 F (36.6 C), temperature source Oral, resp. rate 18, SpO2 100.00%. General: Well developed, well nourished WF in no acute distress. Head: Normocephalic, atraumatic, sclera non-icteric, no xanthomas, nares are without discharge. Neck: Negative for carotid bruits. JVD not elevated. Lungs: Coarse breath sounds but clear bilaterally to auscultation without wheezes, rales, or rhonchi. Breathing is unlabored.   Heart: Regular rhythm but mildly tachycardic with S1 S2. No murmurs, rubs, or gallops appreciated. Abdomen: Soft, non-tender, non-distended with normoactive bowel sounds. No hepatomegaly. No rebound/guarding. No obvious abdominal masses. Msk:  Strength and tone appear normal for age. Extremities: No clubbing or cyanosis. No edema.  Distal pedal pulses are 2+ and equal  bilaterally. Neuro: Alert and oriented X 3. Moves all extremities spontaneously. Psych:  Responds to questions appropriately with a normal affect (slightly anxious).   ASSESSMENT AND PLAN:   1. Chest pain/dyspnea - differential is worrisome for differential of [place], PE, or lung malignancy. Will initiate IV heparin per pharmacy. Proceed with CT angio to rule out PE as well as evaluate lung periphery for any masses (discussed with radiology). If this initial workup is negative, will likely need cardiac cath to r/o obstructive coronary disease but timing will be determined. Continue ASA, statin, BB, Imdur. Check echocardiogram. Doubt CHF. 2. Sinus tachycardia - will rule out PE. Check thyroid studies. 3. CAD s/p DES to RCA in 2005 - continue ASA, BB, statin. Add heparin. Will need cath if workup for PE or malignancy is unremarkable. 4. Longstanding tobacco abuse with gradual weight loss - see above. Counseled on importance of cessation. Will also ask to obtain records from Dr. Janus Molder (sp?) with GI for her colonoscopy results in June. 5. Diabetes mellitus - hold Metformin. Continue other meds and add SSI and CBG checks. 6. HTN - slightly elevated. Will make sure she gets her noon dose of BB due now. Follow BP once she is back on home regimen. 7. HL - continue Crestor. 8. H/o ministroke - continue ASA, Plavix.  Signed, Ronie Spies PA-C 04/08/2012, 3:53 PM  Patient was seen in the emergency room with Ronie Spies, PA-C.  I agree with her assessment and plan.  The patient's physical findings are unremarkable except for her persistent tachycardia.  Her electrocardiogram does show an increase in nonspecific ST-T wave changes but no acute ischemic change.  Her chest x-ray shows normal heart size and no evidence of congestive heart failure although there may be some atelectasis at the left base.  She will need a cardiac catheterization at some point but if her cardiac enzymes and serial EKGs are unremarkable it  is possible that she could be discharged over the long Labor Day weekend with close outpatient followup next week with her primary cardiologist for consideration for cardiac catheterization.

## 2012-04-08 NOTE — Progress Notes (Signed)
Patient complaining of chestpain 5/10. Patient given 1 Sl nitro, Rn waited 5 minutes to re-assess, B/P 123/78, patient states chestpain 4/10. Patient given another SL Nitro. Rn awaited five minutes to re-assess, B/P 116/71. Patient states chestpain free. EKG given, St 112. WIll continue to monitor.   Stokes Rattigan,Rn,BSn

## 2012-04-08 NOTE — ED Notes (Signed)
To ED from PCP via EMS for chest pain, also c/o N/V, 20g left wrist, 325 ASA pta, had 1 nitro at PCP, is pain free on arrival, VSS, CBG 174, NAD

## 2012-04-09 DIAGNOSIS — R079 Chest pain, unspecified: Secondary | ICD-10-CM

## 2012-04-09 LAB — COMPREHENSIVE METABOLIC PANEL
ALT: 19 U/L (ref 0–35)
AST: 23 U/L (ref 0–37)
Albumin: 3.5 g/dL (ref 3.5–5.2)
Alkaline Phosphatase: 47 U/L (ref 39–117)
GFR calc Af Amer: 83 mL/min — ABNORMAL LOW (ref >90)
Glucose, Bld: 133 mg/dL — ABNORMAL HIGH (ref 70–99)
Potassium: 3.7 mEq/L (ref 3.5–5.1)
Sodium: 143 mEq/L (ref 135–145)
Total Protein: 6.3 g/dL (ref 6.0–8.3)

## 2012-04-09 LAB — TSH: TSH: 2.988 u[IU]/mL (ref 0.350–4.500)

## 2012-04-09 LAB — CBC
HCT: 34.3 % — ABNORMAL LOW (ref 36.0–46.0)
Hemoglobin: 11.4 g/dL — ABNORMAL LOW (ref 12.0–15.0)
MCHC: 33.2 g/dL (ref 30.0–36.0)
MCV: 91.2 fL (ref 78.0–100.0)
RDW: 13.5 % (ref 11.5–15.5)
WBC: 6.7 10*3/uL (ref 4.0–10.5)

## 2012-04-09 LAB — LIPID PANEL
Cholesterol: 121 mg/dL (ref 0–200)
HDL: 31 mg/dL — ABNORMAL LOW (ref >39)
Total CHOL/HDL Ratio: 3.9 RATIO
Triglycerides: 191 mg/dL — ABNORMAL HIGH (ref <150)

## 2012-04-09 LAB — GLUCOSE, CAPILLARY: Glucose-Capillary: 123 mg/dL — ABNORMAL HIGH (ref 70–99)

## 2012-04-09 MED ORDER — ATENOLOL 50 MG PO TABS: 50.0000 mg | ORAL_TABLET | Freq: Two times a day (BID) | ORAL | Status: DC

## 2012-04-09 MED ORDER — HEPARIN BOLUS VIA INFUSION
2000.0000 [IU] | Freq: Once | INTRAVENOUS | Status: AC
  Administered 2012-04-09: 2000 [IU] via INTRAVENOUS
  Filled 2012-04-09: qty 2000

## 2012-04-09 MED ORDER — HEPARIN BOLUS VIA INFUSION
2000.0000 [IU] | Freq: Once | INTRAVENOUS | Status: DC
  Filled 2012-04-09: qty 2000

## 2012-04-09 MED ORDER — ATENOLOL 50 MG PO TABS
75.0000 mg | ORAL_TABLET | Freq: Two times a day (BID) | ORAL | Status: DC
  Administered 2012-04-09: 75 mg via ORAL
  Filled 2012-04-09 (×2): qty 1

## 2012-04-09 NOTE — Discharge Summary (Signed)
Physician Discharge Summary  Patient ID: Lindsay Brooks MRN: [id number] DOB/AGE: Aug 28, 1950 61 y.o.  Admit date: 04/08/2012 Discharge date: 04/09/2012  Primary Cardiologist: Calton Dach, MD   Primary Discharge Diagnosis:  1 Chest Pain  - NL Troponins  2 Tachypalpitations  3 Left upper lobe nodule (3 mm), nonspecific   Secondary Discharge Diagnoses: Past Medical History  Diagnosis Date  . Peripheral vascular disease, unspecified     a. s/p L common femoral & left illiac stenting previously (patent by angio in 2007). - ABI 06/2011 normal. b. Carotid US 0-39% BICA 06/2011 (for f/u 2 yrs)  . Type II or unspecified type diabetes mellitus without mention of complication, not stated as uncontrolled   . GERD (gastroesophageal reflux disease)   . Tobacco use disorder   . Personal history of peptic ulcer disease     Remotely.  . Other isolated or specific phobias   . Mini stroke     History of, in 2005 (before she was diagnosed with CAD).  . Other and unspecified hyperlipidemia   . Unspecified essential hypertension   . CAD (coronary artery disease)     a. s/p 2 CoStar DES to RCA 06/2004. b, Last cath 2009 with patent stents (otherwise diffuse nonobstructive diseasE).   . Colon polyps     a. s/p removal 01/2012 - was told one might be cancerous but was not diagnosed with colon cancer - plan is for f/u colonoscopy in 5 yrs.    Reason for Admission: 61 y/o F with hx of CAD s/p DES x 2 to RCA in 2005, PVD s/p common femoral & iliac stenting, DM, HTN, ongoing tobacco abuse who presented to [facility] with complaint of chest pain and SOB.   Hospital Course: Patient ruled out for MI with normal troponins. Chest CT scan was negative for pulmonary embolism. She was cleared after 24-hour observation, by Dr. Sherryl Manges, with final recommendation to increase atenolol to 75/50 mg daily, for treatment of long-standing history of tachycardia palpitations.    Discharge Vitals: Blood pressure 116/78,  pulse 108, temperature 98.5 F (36.9 C), temperature source Oral, resp. rate 19, height 5\' 2"  (1.575 m), weight 152 lb 1.9 oz (69 kg), SpO2 92.00%.  Labs: Lab Results  Component Value Date   WBC 6.7 04/09/2012   HGB 11.4* 04/09/2012   HCT 34.3* 04/09/2012   MCV 91.2 04/09/2012   PLT 190 04/09/2012      Lab 04/09/12 0527  NA 143  K 3.7  CL 108  CO2 25  BUN 16  CREATININE 0.86  CALCIUM 9.6  ALBUMIN 3.5  PROT 6.3  BILITOT 0.2*  ALKPHOS 47  ALT 19  AST 23  GLUCOSE 133*    Lab Results  Component Value Date   CHOL 121 04/09/2012   HDL 31* 04/09/2012   LDLCALC 52 04/09/2012   TRIG 191* 04/09/2012    No results found for this basename: DDIMER    Lab Results  Component Value Date   TSH 2.988 04/08/2012     Basename 04/09/12 0117 04/08/12 2039 04/08/12 1455  CKTOTAL -- -- --  CKMB -- -- --  TROPONINI <0.30 <0.30 <0.30    Diagnostic Studies: Dg Chest 2 View  04/08/2012  *RADIOLOGY REPORT*  Clinical Data: Chest pain.  Shortness of breath.  Diabetes. Hypertension.  Tobacco use.  CHEST - 2 VIEW  Comparison: 04/12/2008  Findings: Stable lingular scarring noted.  Heart size is within normal limits.  The lungs are otherwise clear.  No pleural  effusion observed.  IMPRESSION: 1.  Stable lingular scarring.  No acute findings.   Original Report Authenticated By: Dellia Cloud, M.D.    Ct Angio Chest Pe W/cm &/or Wo Cm  04/08/2012  *RADIOLOGY REPORT*  Clinical Data: Chest pain, shortness of breath, sinus tachycardia, tobacco use, question pulmonary embolism, hypertension, diabetes  CT ANGIOGRAPHY CHEST  Technique:  Multidetector CT imaging of the chest using the standard protocol during bolus administration of intravenous contrast. Multiplanar reconstructed images including MIPs were obtained and reviewed to evaluate the vascular anatomy.  Contrast: OMNIPAQUE IOHEXOL 350 MG/ML SOLN  Comparison: None  Findings: Aorta normal caliber without aneurysm or dissection. Pulmonary  arteries patent. No evidence of pulmonary embolism. No thoracic adenopathy. Visualized portion of upper abdomen normal appearance. Minimal scattered respiratory motion artifacts. Questionable small focus of infiltrate in right mid lung versus motion artifact. Nonspecific 3 mm left upper lobe nodule image 12. Remaining lungs clear. No pleural effusion or pneumothorax. No acute osseous findings.  IMPRESSION: No evidence of pulmonary embolism. Questionable small focus of infiltrate versus motion artifact in right mid lung. Nonspecific 3 mm left upper lobe nodule, recommendation below.  Given risk factors for bronchogenic carcinoma, follow-up chest CT at 1 year is recommended.  This recommendation follows the consensus statement:  Guidelines for Management of Small Pulmonary Nodules Detected on CT Scans:  A Statement from the Fleischner Society as published in Radiology 2005; 237:395-400.   Original Report Authenticated By: Lollie Marrow, M.D.      DISPOSITION: Stable condition  FOLLOW UP PLANS AND APPOINTMENTS:  Follow-up Information    Follow up with Tereso Newcomer, PA in 3 weeks. (office will call and arrange)    Contact information:   [address] [place] [address] [phone]          DISCHARGE MEDICATIONS: Medication List  As of 04/09/2012 12:31 PM   TAKE these medications         aspirin EC 81 MG tablet   Take 81 mg by mouth daily.      atenolol 50 MG tablet   Commonly known as: TENORMIN   Take 1 & 1/2 tablets (75 mg total) by mouth in the morning, 1 tablet (50 mg total) in the evening      buPROPion 150 MG 12 hr tablet   Commonly known as: WELLBUTRIN SR   Take 150 mg by mouth 2 (two) times daily.      CYMBALTA 60 MG capsule   Generic drug: DULoxetine   Take 120 mg by mouth at bedtime.      famotidine 20 MG tablet   Commonly known as: PEPCID   Take 20 mg by mouth 2 (two) times daily.      hydrochlorothiazide 25 MG tablet   Commonly known  as: HYDRODIURIL   Take 25 mg by mouth daily.      isosorbide dinitrate 40 MG CR tablet   Commonly known as: ISOCHRON   Take 40 mg by mouth 2 (two) times daily.      lidocaine 5 %   Commonly known as: LIDODERM   Place 1 patch onto the skin daily as needed. For back pain. 12 hours on 12 hours off      LORazepam 0.5 MG tablet   Commonly known as: ATIVAN   Take 0.5 mg by mouth daily as needed. For anxiety      metFORMIN 1000 MG tablet   Commonly known as: GLUCOPHAGE   Take 1,000  mg by mouth 2 (two) times daily with a meal.      NEXIUM 40 MG capsule   Generic drug: esomeprazole   Take 40 mg by mouth daily.      nitroGLYCERIN 0.4 MG SL tablet   Commonly known as: NITROSTAT   Place 0.4 mg under the tongue every 5 (five) minutes as needed. For chest pain      PLAVIX 75 MG tablet   Generic drug: clopidogrel   Take 75 mg by mouth daily at 12 noon.      potassium chloride 10 MEQ CR capsule   Commonly known as: MICRO-K   Take 10 mEq by mouth daily at 12 noon.      rosuvastatin 20 MG tablet   Commonly known as: CRESTOR   Take 20 mg by mouth at bedtime.      SEROQUEL 50 MG tablet   Generic drug: QUEtiapine   Take 50 mg by mouth at bedtime.      sitaGLIPtin 100 MG tablet   Commonly known as: JANUVIA   Take 100 mg by mouth daily.      TRICOR 145 MG tablet   Generic drug: fenofibrate   Take 145 mg by mouth at bedtime.      valsartan 320 MG tablet   Commonly known as: DIOVAN   Take 320 mg by mouth daily at 12 noon.      zolpidem 10 MG tablet   Commonly known as: AMBIEN   Take 10 mg by mouth at bedtime as needed. For sleep            BRING ALL MEDICATIONS WITH YOU TO FOLLOW UP APPOINTMENTS  Time spent with patient to include physician time: Greater than 30 minutes, including physician time.  SignedPrescott Parma 04/09/2012, 12:35 PM Co-Sign MD

## 2012-04-09 NOTE — Progress Notes (Signed)
Patient Name: Lindsay Brooks      SUBJECTIVE:admitted with sob and cp  Sinus tach and abnormal CXR  CT>> No evidence of pulmonary embolism.  Questionable small focus of infiltrate versus motion artifact in right mid lung. Nonspecific 3 mm left upper lobe nodule,   Feeling much better this am, back to baseline  she notes that when she walks oh she has a long-standing history of tachy palpitations and dyspnea.   Past Medical History  Diagnosis Date  . Peripheral vascular disease, unspecified     a. s/p L common femoral & left illiac stenting previously (patent by angio in 2007). - ABI 06/2011 normal. b. Carotid US 0-39% BICA 06/2011 (for f/u 2 yrs)  . Type II or unspecified type diabetes mellitus without mention of complication, not stated as uncontrolled   . GERD (gastroesophageal reflux disease)   . Tobacco use disorder   . Personal history of peptic ulcer disease     Remotely.  . Other isolated or specific phobias   . Mini stroke     History of, in 2005 (before she was diagnosed with CAD).  . Other and unspecified hyperlipidemia   . Unspecified essential hypertension   . CAD (coronary artery disease)     a. s/p 2 CoStar DES to RCA 06/2004. b, Last cath 2009 with patent stents (otherwise diffuse nonobstructive diseasE).   . Colon polyps     a. s/p removal 01/2012 - was told one might be cancerous but was not diagnosed with colon cancer - plan is for f/u colonoscopy in 5 yrs.    PHYSICAL EXAM Filed Vitals:   04/08/12 1700 04/08/12 1718 04/08/12 1956 04/09/12 0522  BP:  141/80 151/85 116/73  Pulse:   105 110  Temp:   98.8 F (37.1 C) 98.5 F (36.9 C)  TempSrc:   Oral Oral  Resp:  20 18 19   Height: 5' 1.81" (1.57 m)  5\' 2"  (1.575 m)   Weight: 147 lb 11.3 oz (67 kg)  152 lb 1.9 oz (69 kg)   SpO2:  98% 95% 92%  Well developed and nourished in no acute distress HENT normal Neck supple with JVP-flat Clear Regular rate and rhythm, no murmurs or gallops Abd-soft with active  BS No Clubbing cyanosis edema Skin-warm and dry A & Oriented  Grossly normal sensory and motor function  TELEMETRY: Reviewed telemetry pt in sinus rhythm with rates varying from 90-140  TSH is pending:    Intake/Output Summary (Last 24 hours) at 04/09/12 0929 Last data filed at 04/09/12 0500  Gross per 24 hour  Intake    360 ml  Output    100 ml  Net    260 ml    LABS: Basic Metabolic Panel:  Lab 04/09/12 4098 04/08/12 1123  NA 143 143  K 3.7 3.5  CL 108 103  CO2 25 26  GLUCOSE 133* 137*  BUN 16 16  CREATININE 0.86 0.87  CALCIUM 9.6 9.9  MG -- --  PHOS -- --   Cardiac Enzymes:  Basename 04/09/12 0117 04/08/12 2039 04/08/12 1455  CKTOTAL -- -- --  CKMB -- -- --  CKMBINDEX -- -- --  TROPONINI <0.30 <0.30 <0.30   CBC:  Lab 04/09/12 0527 04/08/12 1123  WBC 6.7 8.5  NEUTROABS -- --  HGB 11.4* 13.2  HCT 34.3* 39.4  MCV 91.2 91.0  PLT 190 227   PROTIME:  Basename 04/08/12 2328  LABPROT 14.0  INR 1.06   Liver Function Tests:  Basename 04/09/12 0527  AST 23  ALT 19  ALKPHOS 47  BILITOT 0.2*  PROT 6.3  ALBUMIN 3.5    Fasting Lipid Panel:  Basename 04/09/12 0527  CHOL 121  HDL 31*  LDLCALC 52  TRIG 782*  CHOLHDL 3.9  LDLDIRECT --      ASSESSMENT AND PLAN:  CAD TAchycardia Dyslipidemia HTN Pr VAs Dis DM  Patient desired to go. I think that that is okay. I am bothered by her history of tachypalpitations and will be important to note what her thyroid status is. Changing her beta blockers may also help with this, but right now simply increase her atenolol to 75/50 We'll continue her on statin therapy as she is at target.  Her Plavix is long-standing and may be related to remote stenting; I think would be worth asking interventional whether it could be stopped. He notes that Dr. Excell Seltzer is her primary cardiologist  Recommend followup with Tereso Newcomer in 3-4 weeks and then followup with Tonny Bollman thereafter Signed, Sherryl Manges  MD  04/09/2012

## 2012-04-09 NOTE — Progress Notes (Signed)
ANTICOAGULATION CONSULT NOTE - Follow Up Consult  Pharmacy Consult for heparin Indication: r/o PE vs USAP  Labs:  Basename 04/09/12 0806 04/09/12 0527 04/09/12 0117 04/08/12 2328 04/08/12 2039 04/08/12 1455 04/08/12 1123  HGB -- 11.4* -- -- -- -- 13.2  HCT -- 34.3* -- -- -- -- 39.4  PLT -- 190 -- -- -- -- 227  APTT -- -- -- -- -- -- --  LABPROT -- -- -- 14.0 -- -- --  INR -- -- -- 1.06 -- -- --  HEPARINUNFRC 0.13* -- -- <0.10* -- -- --  CREATININE -- 0.86 -- -- -- -- 0.87  CKTOTAL -- -- -- -- -- -- --  CKMB -- -- -- -- -- -- --  TROPONINI -- -- <0.30 -- <0.30 <0.30 --    Assessment: 61yo female on heparin with initial dosing for USAP. CT scan neg for PE. Trop neg so far. Heparin level remains subtherapeutic. No bleeding noted.  Goal of Therapy:  Heparin level 0.3-0.7 units/ml   Plan:  1) Will give additional heparin bolus of 2000 units x1 and increase gtt to 1500 units/hr. 2) Will check 6 hr heparin level  Christoper Fabian, PharmD, BCPS Clinical pharmacist, pager [phone] 04/09/2012,9:27 AM

## 2012-04-09 NOTE — Progress Notes (Signed)
04/09/2012 1:08 PM Nursing note Discharge avs form, rx, medications already taken today and those due this evening given and explained to patient and family. D/c iv line. D/c tele. Correct use of sl ntg reviewed. Follow up appointments discussed. Questions and concerns addressed. D/c home per orders.  Hosie Sharman, Blanchard Kelch

## 2012-04-09 NOTE — Progress Notes (Signed)
ANTICOAGULATION CONSULT NOTE - Follow Up Consult  Pharmacy Consult for heparin Indication: r/o PE vs USAP  Labs:  Basename 04/08/12 2328 04/08/12 2039 04/08/12 1455 04/08/12 1123  HGB -- -- -- 13.2  HCT -- -- -- 39.4  PLT -- -- -- 227  APTT -- -- -- --  LABPROT 14.0 -- -- --  INR 1.06 -- -- --  HEPARINUNFRC <0.10* -- -- --  CREATININE -- -- -- 0.87  CKTOTAL -- -- -- --  CKMB -- -- -- --  TROPONINI -- <0.30 <0.30 --    Assessment: 61yo female undetectable on heparin with initial dosing for USAP vs PE; CT scan negative for PE.  Goal of Therapy:  Heparin level 0.3-0.7 units/ml   Plan:  Will give additional heparin bolus of 2000 units x1 and increase gtt by 4 units/kg/hr to 1250 units/hr and check level with next CE.  Colleen Can PharmD BCPS 04/09/2012,1:29 AM

## 2012-04-26 ENCOUNTER — Ambulatory Visit (INDEPENDENT_AMBULATORY_CARE_PROVIDER_SITE_OTHER): Payer: Medicare Other | Admitting: Physician Assistant

## 2012-04-26 ENCOUNTER — Encounter: Payer: Self-pay | Admitting: Physician Assistant

## 2012-04-26 VITALS — BP 116/76 | HR 72 | Ht 62.0 in | Wt 151.4 lb

## 2012-04-26 DIAGNOSIS — I739 Peripheral vascular disease, unspecified: Secondary | ICD-10-CM | POA: Diagnosis not present

## 2012-04-26 DIAGNOSIS — R911 Solitary pulmonary nodule: Secondary | ICD-10-CM

## 2012-04-26 DIAGNOSIS — I25810 Atherosclerosis of coronary artery bypass graft(s) without angina pectoris: Secondary | ICD-10-CM

## 2012-04-26 DIAGNOSIS — R002 Palpitations: Secondary | ICD-10-CM

## 2012-04-26 DIAGNOSIS — R079 Chest pain, unspecified: Secondary | ICD-10-CM

## 2012-04-26 MED ORDER — ISOSORBIDE DINITRATE ER 40 MG PO TBCR: EXTENDED_RELEASE_TABLET | ORAL | Status: DC

## 2012-04-26 MED ORDER — SITAGLIP PHOS-METFORMIN HCL ER 100-1000 MG PO TB24: 100.0000 mg | ORAL_TABLET | Freq: Two times a day (BID) | ORAL | Status: DC

## 2012-04-26 NOTE — Patient Instructions (Addendum)
Your physician has recommended you make the following change in your medication: INCREASE ISOSORBIDE DINITRATE TO ONE AND ONE HALF TABLET (60 MG) TWICE A DAY   Your physician has requested that you have en exercise stress myoview. For further information please visit [email or web address] Please follow instruction sheet, as given.   Your physician has requested that you have cardiac CT. Cardiac computed tomography (CT) is a painless test that uses an x-ray machine to take clear, detailed pictures of your heart. For further information please visit [email or web address] Please follow instruction sheet as given. TO REPEAT IN ONE YEAR 03/2013: DX: LUNG NODULE     Your physician has requested that you have a lower  extremity arterial duplex. This test is an ultrasound of the arteries in the legs or arms. It looks at arterial blood flow in the legs and arms. Allow one hour for Lower and Upper Arterial scans. There are no restrictions or special instructions, THIS IS TO BE DONE WITH ABI'S  Your physician recommends that you schedule a follow-up appointment WITH DR. Excell Seltzer IN 2 MONTHS

## 2012-04-26 NOTE — Progress Notes (Signed)
[address] [place], [place]  [address] Phone: [phone] Fax:  [phone]  Date:  04/26/2012   Name:  Lindsay Brooks   DOB:  Aug 28, 1950   MRN:  [id number]  PCP:  Blane Ohara, MD  Primary Cardiologist:  Dr. Tonny Bollman  Primary Electrophysiologist:  None    History of Present Illness: Lindsay Brooks is a 61 y.o. female who returns for post hospital follow up.  She has a history of CAD, status post DES to RCA in 11/05, PAD, status post left CFA and left iliac stent, carotid stenosis, DM2, GERD, prior TIA, HTN, HL. Previously followed by Dr. Lorine Bears in [place]. Now to be followed by Dr. Tonny Bollman. She was admitted 8/30-8/31 with chest pain. She ruled out for myocardial infarction by enzymes. Chest CT was negative for pulmonary embolism. This did demonstrate a nonspecific 3 mm left upper lobe nodule. Recommendation was for one year followup. She was noted to have tachycardia palpitations. Recommendation was to increase her beta blocker therapy.  She has a chronic history of chest pain. She had chest pain with both emotional stress as well as with exertion. She continues to smoke cigarettes. She notes dyspnea with exertion. She probably describes Class IIb symptoms. She denies syncope. She sleeps on 3 pillows chronically. She denies true PND. She denies LE edema. Continues to have occasional palpitations.  Wt Readings from Last 3 Encounters:  04/26/12 151 lb 6.4 oz (68.675 kg)  04/08/12 152 lb 1.9 oz (69 kg)  03/05/11 148 lb (67.132 kg)     Past Medical History  Diagnosis Date  . Peripheral vascular disease, unspecified     a. s/p L common femoral & left illiac stenting previously (patent by angio in 2007). - ABI 06/2011 normal. b. Carotid US 0-39% BICA 06/2011 (for f/u 2 yrs)  . Type II or unspecified type diabetes mellitus without mention of complication, not stated as uncontrolled   . GERD (gastroesophageal reflux disease)   . Tobacco  use disorder   . Personal history of peptic ulcer disease     Remotely.  . Other isolated or specific phobias   . Mini stroke     History of, in 2005 (before she was diagnosed with CAD).  . Other and unspecified hyperlipidemia   . Unspecified essential hypertension   . CAD (coronary artery disease)     a. s/p 2 CoStar DES to RCA 06/2004. b.  LHC 12/09:  EF 65%, oCFX 40%, oRCA 30%, then 30-40% leading into stented seg that is patent, (10-20% ISR), 40% beyond stent,  . Colon polyps     a. s/p removal 01/2012 - was told one might be cancerous but was not diagnosed with colon cancer - plan is for f/u colonoscopy in 5 yrs.  Marland Kitchen Hx of echocardiogram     a. Echo 06/2011: EF 55-60%, Gr 1 diast dysfn, mild MR    Current Outpatient Prescriptions  Medication Sig Dispense Refill  . aspirin EC 81 MG tablet Take 81 mg by mouth daily.      Marland Kitchen atenolol (TENORMIN) 50 MG tablet Take 1 tablet (50 mg total) by mouth 2 (two) times daily. Morning and noon  60 tablet  6  . buPROPion (WELLBUTRIN SR) 150 MG 12 hr tablet Take 150 mg by mouth 2 (two) times daily.      . clopidogrel (PLAVIX) 75 MG tablet Take 75 mg by mouth daily at 12 noon.       . DULoxetine (  CYMBALTA) 60 MG capsule Take 120 mg by mouth at bedtime.       Marland Kitchen esomeprazole (NEXIUM) 40 MG capsule Take 40 mg by mouth daily.       . famotidine (PEPCID) 20 MG tablet Take 20 mg by mouth 2 (two) times daily.        . fenofibrate (TRICOR) 145 MG tablet Take 145 mg by mouth at bedtime.       . hydrochlorothiazide 25 MG tablet Take 25 mg by mouth daily.        . isosorbide dinitrate (ISOCHRON) 40 MG CR tablet Take 40 mg by mouth 2 (two) times daily.       Marland Kitchen lidocaine (LIDODERM) 5 % Place 1 patch onto the skin daily as needed. For back pain. 12 hours on 12 hours off      . LORazepam (ATIVAN) 0.5 MG tablet Take 0.5 mg by mouth daily as needed. For anxiety      . metFORMIN (GLUCOPHAGE) 1000 MG tablet Take 1,000 mg by mouth 2 (two) times daily with a meal.       .  nitroGLYCERIN (NITROSTAT) 0.4 MG SL tablet Place 0.4 mg under the tongue every 5 (five) minutes as needed. For chest pain      . potassium chloride (MICRO-K) 10 MEQ CR capsule Take 10 mEq by mouth daily at 12 noon.       . QUEtiapine (SEROQUEL) 50 MG tablet Take 50 mg by mouth at bedtime.        . rosuvastatin (CRESTOR) 20 MG tablet Take 20 mg by mouth at bedtime.      . sitaGLIPtin (JANUVIA) 100 MG tablet Take 100 mg by mouth daily.      . valsartan (DIOVAN) 320 MG tablet Take 320 mg by mouth daily at 12 noon.       Marland Kitchen zolpidem (AMBIEN) 10 MG tablet Take 10 mg by mouth at bedtime as needed. For sleep        Allergies: Allergies  Allergen Reactions  . Celecoxib Hives  . Vioxx (Rofecoxib) Hives    History  Substance Use Topics  . Smoking status: Current Every Day Smoker -- 1.5 packs/day  . Smokeless tobacco: Not on file   Comment: greater than 40 pack year history of tobacco use  . Alcohol Use: No     Recovering alcoholic. Sober for >30 years.     ROS:  Please see the history of present illness.   She has left lower extremity claudication. She feels this may be getting worse.  All other systems reviewed and negative.   PHYSICAL EXAM: VS:  BP 116/76  Pulse 72  Ht 5\' 2"  (1.575 m)  Wt 151 lb 6.4 oz (68.675 kg)  BMI 27.69 kg/m2 Well nourished, well developed, in no acute distress HEENT: normal Neck: no JVD Cardiac:  normal S1, S2; RRR; no murmur Lungs:  Decreased breath sounds bilaterally, no wheezing, rhonchi or rales Abd: soft, nontender, no hepatomegaly Ext: no edema Vascular: DP/PT 1+ bilaterally Skin: warm and dry Neuro:  CNs 2-12 intact, no focal abnormalities noted  EKG:  Sinus rhythm, heart rate 72, normal axis, RBBB, no change from prior tracing      ASSESSMENT AND PLAN:  1. Coronary Artery Disease:  She continues to have chest discomfort. She really has chronic chest discomfort. She thinks that it may be somewhat worse. It has been almost 4 years since her last  assessment for ischemia. She continues to smoke and she is a  diabetic. I will set her up for an ETT-Myoview to rule out significant ischemia. I will increase her isosorbide to 60 mg twice a day. She will followup with Dr. Excell Seltzer in 2 months or sooner if her stress test is abnormal.  2. Pulmonary Nodule:  Unfortunately, she continues to smoke. Recommendation from the radiologist is to repeat her CT scan in one year. We will make sure she has a repeat CT scan scheduled in 03/2013.  3. Hypertension:  Controlled.  Continue current therapy.   4. Hyperlipidemia:  Managed by her primary care doctor  5. Peripheral Arterial Disease:  She seems to be describing left lower extremity claudication. I will arrange followup ABIs.  6. Palpitations: Continue current beta blocker therapy.  7. Tobacco Abuse:  We discussed the importance of cessation and different strategies for quitting.     Signed, Tereso Newcomer, PA-C  9:18 AM 04/26/2012

## 2012-04-28 ENCOUNTER — Encounter (HOSPITAL_COMMUNITY): Payer: Medicare Other

## 2012-05-03 ENCOUNTER — Encounter (INDEPENDENT_AMBULATORY_CARE_PROVIDER_SITE_OTHER): Payer: Medicare Other

## 2012-05-03 DIAGNOSIS — R911 Solitary pulmonary nodule: Secondary | ICD-10-CM

## 2012-05-03 DIAGNOSIS — R002 Palpitations: Secondary | ICD-10-CM

## 2012-05-03 DIAGNOSIS — M79609 Pain in unspecified limb: Secondary | ICD-10-CM

## 2012-05-03 DIAGNOSIS — I739 Peripheral vascular disease, unspecified: Secondary | ICD-10-CM | POA: Diagnosis not present

## 2012-05-03 DIAGNOSIS — R079 Chest pain, unspecified: Secondary | ICD-10-CM

## 2012-05-03 DIAGNOSIS — I25810 Atherosclerosis of coronary artery bypass graft(s) without angina pectoris: Secondary | ICD-10-CM

## 2012-05-04 ENCOUNTER — Telehealth: Payer: Self-pay | Admitting: *Deleted

## 2012-05-04 ENCOUNTER — Encounter: Payer: Self-pay | Admitting: Physician Assistant

## 2012-05-04 NOTE — Telephone Encounter (Signed)
pt's husband notified about LEA w/ABI results with verbal understanding today, said he will tell his wife

## 2012-05-04 NOTE — Telephone Encounter (Signed)
Message copied by Tarri Fuller on Wed May 04, 2012  5:25 PM ------      Message from: [place], [place] T      Created: Wed May 04, 2012  1:21 PM       Normal ABIs      Tereso Newcomer, [place]  1:21 PM 05/04/2012

## 2012-05-05 ENCOUNTER — Ambulatory Visit (HOSPITAL_COMMUNITY): Payer: Medicare Other | Attending: Cardiovascular Disease | Admitting: Radiology

## 2012-05-05 VITALS — BP 115/71 | Ht 62.0 in | Wt 151.0 lb

## 2012-05-05 DIAGNOSIS — E119 Type 2 diabetes mellitus without complications: Secondary | ICD-10-CM | POA: Insufficient documentation

## 2012-05-05 DIAGNOSIS — R079 Chest pain, unspecified: Secondary | ICD-10-CM | POA: Diagnosis not present

## 2012-05-05 DIAGNOSIS — F17200 Nicotine dependence, unspecified, uncomplicated: Secondary | ICD-10-CM | POA: Insufficient documentation

## 2012-05-05 DIAGNOSIS — I2510 Atherosclerotic heart disease of native coronary artery without angina pectoris: Secondary | ICD-10-CM | POA: Insufficient documentation

## 2012-05-05 DIAGNOSIS — I739 Peripheral vascular disease, unspecified: Secondary | ICD-10-CM

## 2012-05-05 DIAGNOSIS — R42 Dizziness and giddiness: Secondary | ICD-10-CM | POA: Diagnosis not present

## 2012-05-05 DIAGNOSIS — R0609 Other forms of dyspnea: Secondary | ICD-10-CM | POA: Insufficient documentation

## 2012-05-05 DIAGNOSIS — R002 Palpitations: Secondary | ICD-10-CM | POA: Insufficient documentation

## 2012-05-05 DIAGNOSIS — I4510 Unspecified right bundle-branch block: Secondary | ICD-10-CM | POA: Diagnosis not present

## 2012-05-05 DIAGNOSIS — I10 Essential (primary) hypertension: Secondary | ICD-10-CM | POA: Insufficient documentation

## 2012-05-05 DIAGNOSIS — I25810 Atherosclerosis of coronary artery bypass graft(s) without angina pectoris: Secondary | ICD-10-CM

## 2012-05-05 DIAGNOSIS — R0602 Shortness of breath: Secondary | ICD-10-CM

## 2012-05-05 DIAGNOSIS — R0989 Other specified symptoms and signs involving the circulatory and respiratory systems: Secondary | ICD-10-CM | POA: Insufficient documentation

## 2012-05-05 DIAGNOSIS — R911 Solitary pulmonary nodule: Secondary | ICD-10-CM

## 2012-05-05 MED ORDER — TECHNETIUM TC 99M SESTAMIBI GENERIC - CARDIOLITE
33.0000 | Freq: Once | INTRAVENOUS | Status: AC | PRN
  Administered 2012-05-05: 33 via INTRAVENOUS

## 2012-05-05 MED ORDER — TECHNETIUM TC 99M SESTAMIBI GENERIC - CARDIOLITE
11.0000 | Freq: Once | INTRAVENOUS | Status: AC | PRN
  Administered 2012-05-05: 11 via INTRAVENOUS

## 2012-05-05 NOTE — Progress Notes (Signed)
[facility] SITE 3 NUCLEAR MED [address] [place] [address] [phone]  Cardiology Nuclear Med Study  DRUCILLA DIPIETRANTONIO is a 61 y.o. female     MRN : [id number]     DOB: 15-Feb-1951  Procedure Date: 05/05/2012  Nuclear Med Background Indication for Stress Test:  Evaluation for Ischemia and Stent Patency History:  1/05 Stent RCA, '09 Myocardial Perfusion Study-NL, EF=78%, 12/09 Heart Catheterization-Patent stents, Diffuse Nonobstructive CAD, EF=65%, 11/12 Echo: EF=55-60% Cardiac Risk Factors: Carotid Disease, Family History - CAD, Hypertension, Lipids, NIDDM, PVD, RBBB, Smoker and TIA  Symptoms: Chest Pain/Pressure with/without exertion (last occurrence 3 weeks ago),  Diaphoresis, Dizziness, DOE, Fatigue, Fatigue with Exertion, Light-Headedness, Nausea, Near Syncope, Palpitations, Rapid HR and Vomiting   Nuclear Pre-Procedure Caffeine/Decaff Intake:  None > 12 hrs NPO After: 6:00pm   Lungs:  Clear, diminished breath sounds, no wheezing,O2 Sat: 94-95% on room air. IV 0.9% NS with Angio Cath:  22g  IV Site: R Hand x 1, tolerated well IV Started by:  Irean Hong, RN  Chest Size (in):  40 Cup Size: C  Height: 5\' 2"  (1.575 m)  Weight:  151 lb (68.493 kg)  BMI:  Body mass index is 27.62 kg/(m^2). Tech Comments:  Held atenolol x 24 hrs, held metformin this AM    Nuclear Med Study 1 or 2 day study: 1 day  Stress Test Type:  Stress  Reading MD: Charlton Haws, MD  Order Authorizing Provider:  Tonny Bollman, MD, and Tereso Newcomer, [facility]  Resting Radionuclide: Technetium 7m Sestamibi  Resting Radionuclide Dose: 11.0 mCi   Stress Radionuclide:  Technetium 37m Sestamibi  Stress Radionuclide Dose: 33.0 mCi           Stress Protocol Rest HR: 83 Stress HR: 136  Rest BP: 115/71 Stress BP: 122/58  Exercise Time (min): 3:54 METS: 5:00   Predicted Max HR: 159 bpm % Max HR: 85.53 bpm Rate Pressure Product: 35573   Dose of Adenosine (mg):  n/a Dose of Lexiscan: n/a mg    Dose of Atropine (mg): n/a Dose of Dobutamine: n/a mcg/kg/min (at max HR)  Stress Test Technologist: Irean Hong, RN  Nuclear Technologist:  Domenic Polite, CNMT     Rest Procedure:  Myocardial perfusion imaging was performed at rest 45 minutes following the intravenous administration of Technetium 22m Sestamibi. Rest ECG: NSR, RBBB, inferior Q wave  Stress Procedure:  The patient performed treadmill exercise using a Bruce  Protocol for 3 minutes and 54 seconds,RPE=17 minutes. The patient stopped due to DOE and chest pain 10/10 at peak exercise. The EKG was non diagnostic due to baseline changes. There was a  Hypotensive response, BP 85/60 immediately post exercise. Technetium 71m, Sestamibi was injected at peak exercise and myocardial perfusion imaging was performed after a brief delay. Stress ECG: No significant change from baseline ECG  QPS Raw Data Images:  Patient motion noted. Stress Images:  Normal homogeneous uptake in all areas of the myocardium. Rest Images:  Normal homogeneous uptake in all areas of the myocardium. Subtraction (SDS):  Normal Transient Ischemic Dilatation (Normal <1.22):  1.15 Lung/Heart Ratio (Normal <0.45):  0.43  Quantitative Gated Spect Images QGS EDV:  53 ml QGS ESV:  10 ml  Impression Exercise Capacity:  Poor exercise capacity. BP Response:  Hypotensive blood pressure response. Clinical Symptoms:  There is dyspnea. ECG Impression:  No significant ST segment change suggestive of ischemia. Comparison with Prior Nuclear Study: No images to compare  Overall Impression:  Low  risk stress nuclear study. Baseline ECG abnormal with RBBB and lateral T wave changes.  Chest pain and dyspnea With exercise.  Immediate post peak exercise drop in BP.  Nuclear images are normal  LV Ejection Fraction: 81%.  LV Wall Motion:  NL LV Function; NL Wall Motion   Charlton Haws

## 2012-05-06 ENCOUNTER — Telehealth: Payer: Self-pay | Admitting: *Deleted

## 2012-05-06 ENCOUNTER — Encounter: Payer: Self-pay | Admitting: Physician Assistant

## 2012-05-06 NOTE — Telephone Encounter (Signed)
Message copied by Tarri Fuller on Fri May 06, 2012  2:19 PM ------      Message from: [place], [place] T      Created: Fri May 06, 2012  1:58 PM       Low risk study.      Continue with current treatment plan.      Will fwd to Dr. Tonny Bollman as well for his review.      Tereso Newcomer, PA-C  1:57 PM 05/06/2012

## 2012-05-06 NOTE — Telephone Encounter (Signed)
pt rtnd my call for her myoview results w/verbal understanding

## 2012-05-06 NOTE — Telephone Encounter (Signed)
pt's husband notified today about stress test results w/verbal understanding today

## 2012-05-16 DIAGNOSIS — I2510 Atherosclerotic heart disease of native coronary artery without angina pectoris: Secondary | ICD-10-CM | POA: Diagnosis not present

## 2012-05-16 DIAGNOSIS — E782 Mixed hyperlipidemia: Secondary | ICD-10-CM | POA: Diagnosis not present

## 2012-05-16 DIAGNOSIS — J984 Other disorders of lung: Secondary | ICD-10-CM | POA: Diagnosis not present

## 2012-05-16 DIAGNOSIS — E559 Vitamin D deficiency, unspecified: Secondary | ICD-10-CM | POA: Diagnosis not present

## 2012-05-16 DIAGNOSIS — E7800 Pure hypercholesterolemia, unspecified: Secondary | ICD-10-CM | POA: Diagnosis not present

## 2012-05-16 DIAGNOSIS — E1149 Type 2 diabetes mellitus with other diabetic neurological complication: Secondary | ICD-10-CM | POA: Diagnosis not present

## 2012-05-16 DIAGNOSIS — I10 Essential (primary) hypertension: Secondary | ICD-10-CM | POA: Diagnosis not present

## 2012-05-16 DIAGNOSIS — Z79899 Other long term (current) drug therapy: Secondary | ICD-10-CM | POA: Diagnosis not present

## 2012-06-21 ENCOUNTER — Ambulatory Visit (INDEPENDENT_AMBULATORY_CARE_PROVIDER_SITE_OTHER): Payer: Medicare Other | Admitting: Cardiovascular Disease

## 2012-06-21 ENCOUNTER — Encounter: Payer: Self-pay | Admitting: Cardiovascular Disease

## 2012-06-21 VITALS — BP 126/64 | HR 82 | Ht 62.0 in | Wt 153.0 lb

## 2012-06-21 DIAGNOSIS — I2510 Atherosclerotic heart disease of native coronary artery without angina pectoris: Secondary | ICD-10-CM | POA: Diagnosis not present

## 2012-06-21 NOTE — Patient Instructions (Addendum)
Your physician wants you to follow-up in: 1 YEAR. You will receive a reminder letter in the mail two months in advance. If you don't receive a letter, please call our office to schedule the follow-up appointment.  Your physician recommends that you continue on your current medications as directed. Please refer to the Current Medication list given to you today.  You will be due for a repeat CT scan in August 2014

## 2012-06-21 NOTE — Progress Notes (Signed)
HPI:  61 year old woman presenting for followup evaluation. The patient has coronary and peripheral arterial disease. She has undergone stenting of the right coronary artery. She also has PAD and has undergone left iliac stenting. Because of ongoing chest pain, underlying diabetes, and continued smoking, she underwent a recent Myoview stress scan. This was completed September 26 and it demonstrated a left ventricular ejection fraction of 81% with normal homogenous uptake in all areas of the myocardium. The patient was noted to have poor exercise tolerance.  She continues to have frequent chest pain. She has pain at rest and with exertion. Chest pain is located in the center of the chest and feels like a pressure. She's been under a lot of stress related to family issues. Her mother has passed away. There is disagreement amongst many other siblings. There are several other issues as well. She continues to smoke cigarettes. She complains of dyspnea, but no edema, orthopnea, or PND.  Outpatient Encounter Prescriptions as of 06/21/2012  Medication Sig Dispense Refill  . aspirin EC 81 MG tablet Take 81 mg by mouth daily.      Marland Kitchen atenolol (TENORMIN) 50 MG tablet Take 1 tablet (50 mg total) by mouth 2 (two) times daily. Morning and noon  60 tablet  6  . buPROPion (WELLBUTRIN SR) 150 MG 12 hr tablet Take 150 mg by mouth 2 (two) times daily.      . clopidogrel (PLAVIX) 75 MG tablet Take 75 mg by mouth daily at 12 noon.       . DULoxetine (CYMBALTA) 60 MG capsule Take 120 mg by mouth at bedtime.       Marland Kitchen esomeprazole (NEXIUM) 40 MG capsule Take 40 mg by mouth daily.       . famotidine (PEPCID) 20 MG tablet Take 20 mg by mouth 2 (two) times daily.        . fenofibrate (TRICOR) 145 MG tablet Take 145 mg by mouth at bedtime.       . hydrochlorothiazide 25 MG tablet Take 25 mg by mouth daily.        . isosorbide dinitrate (ISOCHRON) 40 MG CR tablet Take one and one half tablet (60 mg) twice a day  60 tablet  9    . lidocaine (LIDODERM) 5 % Place 1 patch onto the skin daily as needed. For back pain. 12 hours on 12 hours off      . LORazepam (ATIVAN) 0.5 MG tablet Take 0.5 mg by mouth daily as needed. For anxiety      . nitroGLYCERIN (NITROSTAT) 0.4 MG SL tablet Place 0.4 mg under the tongue every 5 (five) minutes as needed. For chest pain      . potassium chloride (MICRO-K) 10 MEQ CR capsule Take 10 mEq by mouth daily at 12 noon.       . QUEtiapine (SEROQUEL) 50 MG tablet Take 50 mg by mouth at bedtime.        . rosuvastatin (CRESTOR) 20 MG tablet Take 20 mg by mouth at bedtime.      . SitaGLIPtin-MetFORMIN HCl 236 075 9030 MG TB24 Take 100 mg by mouth 2 (two) times daily.      . valsartan (DIOVAN) 320 MG tablet Take 320 mg by mouth daily at 12 noon.       Marland Kitchen zolpidem (AMBIEN) 10 MG tablet Take 10 mg by mouth at bedtime as needed. For sleep        Allergies  Allergen Reactions  . Celecoxib Hives  . Insulins  Nausea And Vomiting    Patient unsure of name of insulin, will call us back  . Vioxx (Rofecoxib) Hives    Past Medical History  Diagnosis Date  . Peripheral vascular disease, unspecified     a. s/p L common femoral & left illiac stenting previously (patent by angio in 2007). - ABI 06/2011 normal. b. Carotid US 0-39% BICA 06/2011 (for f/u 2 yrs);   c.  ABIs 9/13:  normal (1.1 bilaterally)  . Type II or unspecified type diabetes mellitus without mention of complication, not stated as uncontrolled   . GERD (gastroesophageal reflux disease)   . Tobacco use disorder   . Personal history of peptic ulcer disease     Remotely.  . Other isolated or specific phobias   . Mini stroke     History of, in 2005 (before she was diagnosed with CAD).  . Other and unspecified hyperlipidemia   . Unspecified essential hypertension   . CAD (coronary artery disease)     a. s/p 2 CoStar DES to RCA 06/2004. b.  LHC 12/09:  EF 65%, oCFX 40%, oRCA 30%, then 30-40% leading into stented seg that is patent, (10-20% ISR),  40% beyond stent,;  c. EF 81%, poor exercise capacity, hypotensive BP response, + chest pain, normal nuclear images (overall low risk)  . Colon polyps     a. s/p removal 01/2012 - was told one might be cancerous but was not diagnosed with colon cancer - plan is for f/u colonoscopy in 5 yrs.  Marland Kitchen Hx of echocardiogram     a. Echo 06/2011: EF 55-60%, Gr 1 diast dysfn, mild MR    ROS: Negative except as per HPI  BP 126/64  Pulse 82  Ht 5\' 2"  (1.575 m)  Wt 69.4 kg (153 lb)  BMI 27.98 kg/m2  SpO2 96%  PHYSICAL EXAM: Pt is alert and oriented, NAD HEENT: normal Neck: JVP - normal, carotids 2+= without bruits Lungs: CTA bilaterally CV: RRR without murmur or gallop Abd: soft, NT, Positive BS, no hepatomegaly Ext: no C/C/E, distal pulses intact and equal Skin: warm/dry no rash  EKG:  Sinus rhythm 78 beats per minute, incomplete right bundle branch block.  Myoview: QPS  Raw Data Images: Patient motion noted.  Stress Images: Normal homogeneous uptake in all areas of the myocardium.  Rest Images: Normal homogeneous uptake in all areas of the myocardium.  Subtraction (SDS): Normal  Transient Ischemic Dilatation (Normal <1.22): 1.15  Lung/Heart Ratio (Normal <0.45): 0.43  Quantitative Gated Spect Images  QGS EDV: 53 ml  QGS ESV: 10 ml  Impression  Exercise Capacity: Poor exercise capacity.  BP Response: Hypotensive blood pressure response.  Clinical Symptoms: There is dyspnea.  ECG Impression: No significant ST segment change suggestive of ischemia.  Comparison with Prior Nuclear Study: No images to compare  Overall Impression: Low risk stress nuclear study. Baseline ECG abnormal with RBBB and lateral T wave changes. Chest pain and dyspnea  With exercise. Immediate post peak exercise drop in BP. Nuclear images are normal  LV Ejection Fraction: 81%. LV Wall Motion: NL LV Function; NL Wall Motion   ASSESSMENT AND PLAN: 1. CAD, native vessel. I think she should continue on her current  medical program. I suspect many of her symptoms are stress related. Her blood pressure is well controlled. Her nuclear scan is completely normal. I will see her back in one year.  2. Tobacco abuse. The patient really needs to quit smoking. She understands this but I do not  think she will be able to quit. She had a lung nodule and this will need to be followed up with a CT scan at 12 month interval is recommended by radiology. This will be arranged.  3. Hyperlipidemia. The patient is on a combination of TriCor and Crestor. She is followed by her primary care physician.  4. Hypertension. Blood pressure is well controlled on a combination of hydrochlorothiazide, isosorbide, and Diovan.  For followup, I would like to see her back in 12 months to

## 2012-06-24 DIAGNOSIS — J0100 Acute maxillary sinusitis, unspecified: Secondary | ICD-10-CM | POA: Diagnosis not present

## 2012-07-28 DIAGNOSIS — J0100 Acute maxillary sinusitis, unspecified: Secondary | ICD-10-CM | POA: Diagnosis not present

## 2012-08-22 DIAGNOSIS — E038 Other specified hypothyroidism: Secondary | ICD-10-CM | POA: Diagnosis not present

## 2012-08-22 DIAGNOSIS — I10 Essential (primary) hypertension: Secondary | ICD-10-CM | POA: Diagnosis not present

## 2012-08-22 DIAGNOSIS — E1149 Type 2 diabetes mellitus with other diabetic neurological complication: Secondary | ICD-10-CM | POA: Diagnosis not present

## 2012-08-22 DIAGNOSIS — E782 Mixed hyperlipidemia: Secondary | ICD-10-CM | POA: Diagnosis not present

## 2012-08-22 DIAGNOSIS — F411 Generalized anxiety disorder: Secondary | ICD-10-CM | POA: Diagnosis not present

## 2012-08-22 DIAGNOSIS — I2510 Atherosclerotic heart disease of native coronary artery without angina pectoris: Secondary | ICD-10-CM | POA: Diagnosis not present

## 2012-10-06 DIAGNOSIS — Z0000 Encounter for general adult medical examination without abnormal findings: Secondary | ICD-10-CM | POA: Diagnosis not present

## 2012-10-06 DIAGNOSIS — Z1239 Encounter for other screening for malignant neoplasm of breast: Secondary | ICD-10-CM | POA: Diagnosis not present

## 2012-10-06 DIAGNOSIS — N390 Urinary tract infection, site not specified: Secondary | ICD-10-CM | POA: Diagnosis not present

## 2012-10-06 DIAGNOSIS — Z1289 Encounter for screening for malignant neoplasm of other sites: Secondary | ICD-10-CM | POA: Diagnosis not present

## 2012-10-11 DIAGNOSIS — Z1231 Encounter for screening mammogram for malignant neoplasm of breast: Secondary | ICD-10-CM | POA: Diagnosis not present

## 2012-11-04 DIAGNOSIS — M25559 Pain in unspecified hip: Secondary | ICD-10-CM | POA: Diagnosis not present

## 2012-11-04 DIAGNOSIS — M545 Low back pain: Secondary | ICD-10-CM | POA: Diagnosis not present

## 2012-11-04 DIAGNOSIS — J309 Allergic rhinitis, unspecified: Secondary | ICD-10-CM | POA: Diagnosis not present

## 2012-12-09 DIAGNOSIS — E559 Vitamin D deficiency, unspecified: Secondary | ICD-10-CM | POA: Diagnosis not present

## 2012-12-09 DIAGNOSIS — E1149 Type 2 diabetes mellitus with other diabetic neurological complication: Secondary | ICD-10-CM | POA: Diagnosis not present

## 2012-12-09 DIAGNOSIS — F411 Generalized anxiety disorder: Secondary | ICD-10-CM | POA: Diagnosis not present

## 2012-12-09 DIAGNOSIS — Z79899 Other long term (current) drug therapy: Secondary | ICD-10-CM | POA: Diagnosis not present

## 2012-12-09 DIAGNOSIS — K219 Gastro-esophageal reflux disease without esophagitis: Secondary | ICD-10-CM | POA: Diagnosis not present

## 2012-12-09 DIAGNOSIS — J309 Allergic rhinitis, unspecified: Secondary | ICD-10-CM | POA: Diagnosis not present

## 2012-12-09 DIAGNOSIS — I2510 Atherosclerotic heart disease of native coronary artery without angina pectoris: Secondary | ICD-10-CM | POA: Diagnosis not present

## 2012-12-09 DIAGNOSIS — E782 Mixed hyperlipidemia: Secondary | ICD-10-CM | POA: Diagnosis not present

## 2012-12-09 DIAGNOSIS — I10 Essential (primary) hypertension: Secondary | ICD-10-CM | POA: Diagnosis not present

## 2012-12-13 DIAGNOSIS — M5106 Intervertebral disc disorders with myelopathy, lumbar region: Secondary | ICD-10-CM | POA: Diagnosis not present

## 2012-12-13 DIAGNOSIS — M48061 Spinal stenosis, lumbar region without neurogenic claudication: Secondary | ICD-10-CM | POA: Diagnosis not present

## 2012-12-15 DIAGNOSIS — M48061 Spinal stenosis, lumbar region without neurogenic claudication: Secondary | ICD-10-CM | POA: Diagnosis not present

## 2013-01-16 DIAGNOSIS — M48061 Spinal stenosis, lumbar region without neurogenic claudication: Secondary | ICD-10-CM | POA: Diagnosis not present

## 2013-01-24 DIAGNOSIS — M5106 Intervertebral disc disorders with myelopathy, lumbar region: Secondary | ICD-10-CM | POA: Diagnosis not present

## 2013-01-24 DIAGNOSIS — M48061 Spinal stenosis, lumbar region without neurogenic claudication: Secondary | ICD-10-CM | POA: Diagnosis not present

## 2013-02-21 DIAGNOSIS — M48061 Spinal stenosis, lumbar region without neurogenic claudication: Secondary | ICD-10-CM | POA: Diagnosis not present

## 2013-02-21 DIAGNOSIS — M5106 Intervertebral disc disorders with myelopathy, lumbar region: Secondary | ICD-10-CM | POA: Diagnosis not present

## 2013-04-19 DIAGNOSIS — I10 Essential (primary) hypertension: Secondary | ICD-10-CM | POA: Diagnosis not present

## 2013-04-19 DIAGNOSIS — K219 Gastro-esophageal reflux disease without esophagitis: Secondary | ICD-10-CM | POA: Diagnosis not present

## 2013-04-19 DIAGNOSIS — E1149 Type 2 diabetes mellitus with other diabetic neurological complication: Secondary | ICD-10-CM | POA: Diagnosis not present

## 2013-04-19 DIAGNOSIS — E782 Mixed hyperlipidemia: Secondary | ICD-10-CM | POA: Diagnosis not present

## 2013-04-19 DIAGNOSIS — F341 Dysthymic disorder: Secondary | ICD-10-CM | POA: Diagnosis not present

## 2013-04-19 DIAGNOSIS — R300 Dysuria: Secondary | ICD-10-CM | POA: Diagnosis not present

## 2013-04-19 DIAGNOSIS — E7800 Pure hypercholesterolemia, unspecified: Secondary | ICD-10-CM | POA: Diagnosis not present

## 2013-04-19 DIAGNOSIS — I2510 Atherosclerotic heart disease of native coronary artery without angina pectoris: Secondary | ICD-10-CM | POA: Diagnosis not present

## 2013-05-09 ENCOUNTER — Ambulatory Visit (INDEPENDENT_AMBULATORY_CARE_PROVIDER_SITE_OTHER)
Admission: RE | Admit: 2013-05-09 | Discharge: 2013-05-09 | Disposition: A | Discharge status: Home / Self Care | Payer: Medicare Other | Source: Ambulatory Visit | Attending: Physician Assistant | Admitting: Physician Assistant

## 2013-05-09 DIAGNOSIS — I739 Peripheral vascular disease, unspecified: Secondary | ICD-10-CM | POA: Diagnosis not present

## 2013-05-09 DIAGNOSIS — R002 Palpitations: Secondary | ICD-10-CM

## 2013-05-09 DIAGNOSIS — R079 Chest pain, unspecified: Secondary | ICD-10-CM | POA: Diagnosis not present

## 2013-05-09 DIAGNOSIS — R911 Solitary pulmonary nodule: Secondary | ICD-10-CM

## 2013-05-09 DIAGNOSIS — I25810 Atherosclerosis of coronary artery bypass graft(s) without angina pectoris: Secondary | ICD-10-CM

## 2013-05-10 ENCOUNTER — Other Ambulatory Visit: Payer: Self-pay | Admitting: *Deleted

## 2013-05-10 DIAGNOSIS — R911 Solitary pulmonary nodule: Secondary | ICD-10-CM

## 2013-05-15 ENCOUNTER — Institutional Professional Consult (permissible substitution): Payer: Medicare Other | Admitting: Internal Medicine

## 2013-05-16 ENCOUNTER — Ambulatory Visit (INDEPENDENT_AMBULATORY_CARE_PROVIDER_SITE_OTHER): Payer: Medicare Other | Admitting: Internal Medicine

## 2013-05-16 ENCOUNTER — Encounter: Payer: Self-pay | Admitting: Internal Medicine

## 2013-05-16 VITALS — BP 102/64 | HR 100 | Temp 98.3°F | Ht 62.0 in | Wt 156.0 lb

## 2013-05-16 DIAGNOSIS — I10 Essential (primary) hypertension: Secondary | ICD-10-CM

## 2013-05-16 DIAGNOSIS — R911 Solitary pulmonary nodule: Secondary | ICD-10-CM

## 2013-05-16 DIAGNOSIS — F17200 Nicotine dependence, unspecified, uncomplicated: Secondary | ICD-10-CM | POA: Diagnosis not present

## 2013-05-16 DIAGNOSIS — J449 Chronic obstructive pulmonary disease, unspecified: Secondary | ICD-10-CM | POA: Diagnosis not present

## 2013-05-16 DIAGNOSIS — J4489 Other specified chronic obstructive pulmonary disease: Secondary | ICD-10-CM

## 2013-05-16 MED ORDER — NEBIVOLOL HCL 10 MG PO TABS: 10.0000 mg | ORAL_TABLET | Freq: Every day | ORAL | Status: DC

## 2013-05-16 NOTE — Progress Notes (Signed)
  Subjective:    Patient ID: Lindsay Brooks, female    DOB: 1950-09-12   MRN: [id number]  HPI  64 yowf active smoker with sob x 2009 progressively worse referred 05/16/2013 by Dr Excell Seltzer f/u by Mcneil Sober in [place] with abn CT / MPN  05/16/2013 1st [facility] office visit/ Angelys Yetman cc indolent onset doe progressive  X 5 y  stops half way back to house from mb which is slt uphill coming back x 173ft  and having trouble walking behind a selfpropelled mower for more than 20 min flat assoc with congested am cough prod of min white thick mucus x years also.  Has not tried saba. On mod doses of atenolol.   No obvious day to day or daytime variabilty or assoc chronic cough or cp or chest tightness, subjective wheeze overt sinus or hb symptoms. No unusual exp hx or h/o childhood pna/ asthma or knowledge of premature birth.  Sleeping ok without nocturnal  or early am exacerbation  of respiratory  c/o's or need for noct saba. Also denies any obvious fluctuation of symptoms with weather or environmental changes or other aggravating or alleviating factors except as outlined above   Current Medications, Allergies, Complete Past Medical History, Past Surgical History, Family History, and Social History were reviewed in [employer] record.          Review of Systems  Constitutional: Positive for appetite change. Negative for fever, chills and unexpected weight change.  HENT: Positive for ear pain, congestion and trouble swallowing. Negative for nosebleeds, sore throat, rhinorrhea, sneezing, dental problem, voice change, postnasal drip and sinus pressure.   Eyes: Negative for visual disturbance.  Respiratory: Positive for cough and shortness of breath. Negative for choking.   Cardiovascular: Negative for chest pain and leg swelling.  Gastrointestinal: Negative for vomiting, abdominal pain and diarrhea.  Genitourinary: Negative for difficulty urinating.       Acid Heartburn   Musculoskeletal: Negative for arthralgias.  Skin: Negative for rash.  Neurological: Positive for headaches. Negative for tremors and syncope.  Hematological: Does not bruise/bleed easily.       Objective:   Physical Exam   Edentulous wf nad Wt Readings from Last 3 Encounters:  05/16/13 156 lb (70.761 kg)  06/21/12 153 lb (69.4 kg)  05/05/12 151 lb (68.493 kg)      HEENT mild turbinate edema.  Oropharynx no thrush or excess pnd or cobblestoning.  No JVD or cervical adenopathy. Mild accessory muscle hypertrophy. Trachea midline, nl thryroid. Chest was hyperinflated by percussion with diminished breath sounds and moderate increased exp time without wheeze. Hoover sign positive at mid inspiration. Regular rate and rhythm without murmur gallop or rub or increase P2 or edema.  Abd: no hsm, nl excursion. Ext warm without cyanosis or clubbing.     CT chest 05/10/13  1. Persistent ground-glass attenuating nodule within the right upper  lobe. Low grade adenocarcinoma of the lung may present as a peer  ground-glass nodule. For this reason this nodule will require annual  surveillance for minimum of 3 years to confirm stability. Today's  study represents 1 year of stability      Assessment & Plan:

## 2013-05-16 NOTE — Patient Instructions (Addendum)
Stop atenolol  Start bystolic one daily tomorrow in place of atenolol   The key is to stop smoking completely before smoking completely stops you!   Please schedule a follow up office visit in 4 weeks, sooner if needed with pft's

## 2013-05-18 DIAGNOSIS — R0600 Dyspnea, unspecified: Secondary | ICD-10-CM | POA: Insufficient documentation

## 2013-05-18 DIAGNOSIS — R911 Solitary pulmonary nodule: Secondary | ICD-10-CM | POA: Insufficient documentation

## 2013-05-18 NOTE — Assessment & Plan Note (Signed)
-   first detected  04/08/12 1.4 cm to 1.6 on 05/09/13   Min growth x one year so prob benign but the only way to prove would be excisional bx which she is clearly not in shape for.  Will check pft's then regroup in 4 weeks

## 2013-05-18 NOTE — Assessment & Plan Note (Signed)
Strongly prefer in this setting: Bystolic, the most beta -1  selective Beta blocker available in sample form, with bisoprolol the most selective generic choice  on the market.  

## 2013-05-18 NOTE — Assessment & Plan Note (Signed)

## 2013-05-18 NOTE — Assessment & Plan Note (Signed)
DDX of  difficult airways managment all start with A and  include Adherence, Ace Inhibitors, Acid Reflux, Active Sinus Disease, Alpha 1 Antitripsin deficiency, Anxiety masquerading as Airways dz,  ABPA,  allergy(esp in young), Aspiration (esp in elderly), Adverse effects of DPI,  Active smokers, plus two Bs  = Bronchiectasis and Beta blocker use..and one C= CHF  Active smoking biggest issue here > discussed separately  ? BB effect  The lack of any sign fluctuation does not support Asthmatic component but still worth trying on a more specific BB than atenolol until she returns for pfts before and after B2 - cardiology note reviewed and no ischemia noted.

## 2013-06-21 ENCOUNTER — Ambulatory Visit: Payer: Medicare Other | Admitting: Cardiovascular Disease

## 2013-06-27 DIAGNOSIS — R42 Dizziness and giddiness: Secondary | ICD-10-CM | POA: Diagnosis not present

## 2013-06-27 DIAGNOSIS — I2510 Atherosclerotic heart disease of native coronary artery without angina pectoris: Secondary | ICD-10-CM | POA: Diagnosis not present

## 2013-06-27 DIAGNOSIS — Z23 Encounter for immunization: Secondary | ICD-10-CM | POA: Diagnosis not present

## 2013-06-27 DIAGNOSIS — K644 Residual hemorrhoidal skin tags: Secondary | ICD-10-CM | POA: Diagnosis not present

## 2013-06-27 DIAGNOSIS — E109 Type 1 diabetes mellitus without complications: Secondary | ICD-10-CM | POA: Diagnosis not present

## 2013-06-27 DIAGNOSIS — K6289 Other specified diseases of anus and rectum: Secondary | ICD-10-CM | POA: Diagnosis not present

## 2013-06-27 DIAGNOSIS — K626 Ulcer of anus and rectum: Secondary | ICD-10-CM | POA: Diagnosis not present

## 2013-06-28 ENCOUNTER — Ambulatory Visit: Payer: Medicare Other | Admitting: Internal Medicine

## 2013-07-18 DIAGNOSIS — K6289 Other specified diseases of anus and rectum: Secondary | ICD-10-CM | POA: Diagnosis not present

## 2013-07-18 DIAGNOSIS — I2510 Atherosclerotic heart disease of native coronary artery without angina pectoris: Secondary | ICD-10-CM | POA: Diagnosis not present

## 2013-07-18 DIAGNOSIS — E109 Type 1 diabetes mellitus without complications: Secondary | ICD-10-CM | POA: Diagnosis not present

## 2013-07-18 DIAGNOSIS — K644 Residual hemorrhoidal skin tags: Secondary | ICD-10-CM | POA: Diagnosis not present

## 2013-07-28 DIAGNOSIS — E1149 Type 2 diabetes mellitus with other diabetic neurological complication: Secondary | ICD-10-CM | POA: Diagnosis not present

## 2013-07-28 DIAGNOSIS — E559 Vitamin D deficiency, unspecified: Secondary | ICD-10-CM | POA: Diagnosis not present

## 2013-07-28 DIAGNOSIS — K219 Gastro-esophageal reflux disease without esophagitis: Secondary | ICD-10-CM | POA: Diagnosis not present

## 2013-07-28 DIAGNOSIS — I2510 Atherosclerotic heart disease of native coronary artery without angina pectoris: Secondary | ICD-10-CM | POA: Diagnosis not present

## 2013-07-28 DIAGNOSIS — E7800 Pure hypercholesterolemia, unspecified: Secondary | ICD-10-CM | POA: Diagnosis not present

## 2013-07-28 DIAGNOSIS — E782 Mixed hyperlipidemia: Secondary | ICD-10-CM | POA: Diagnosis not present

## 2013-07-28 DIAGNOSIS — F341 Dysthymic disorder: Secondary | ICD-10-CM | POA: Diagnosis not present

## 2013-07-28 DIAGNOSIS — I10 Essential (primary) hypertension: Secondary | ICD-10-CM | POA: Diagnosis not present

## 2013-07-28 DIAGNOSIS — N390 Urinary tract infection, site not specified: Secondary | ICD-10-CM | POA: Diagnosis not present

## 2013-07-31 ENCOUNTER — Ambulatory Visit: Payer: Medicare Other | Admitting: Internal Medicine

## 2013-09-07 ENCOUNTER — Ambulatory Visit: Payer: Medicare Other | Admitting: Internal Medicine

## 2013-09-07 ENCOUNTER — Other Ambulatory Visit: Payer: Self-pay | Admitting: *Deleted

## 2013-09-07 DIAGNOSIS — R0602 Shortness of breath: Secondary | ICD-10-CM

## 2013-10-13 ENCOUNTER — Ambulatory Visit: Payer: Medicare Other | Admitting: Internal Medicine

## 2013-11-10 DIAGNOSIS — G459 Transient cerebral ischemic attack, unspecified: Secondary | ICD-10-CM | POA: Diagnosis not present

## 2013-11-10 DIAGNOSIS — E559 Vitamin D deficiency, unspecified: Secondary | ICD-10-CM | POA: Diagnosis not present

## 2013-11-10 DIAGNOSIS — E1149 Type 2 diabetes mellitus with other diabetic neurological complication: Secondary | ICD-10-CM | POA: Diagnosis not present

## 2013-11-10 DIAGNOSIS — I10 Essential (primary) hypertension: Secondary | ICD-10-CM | POA: Diagnosis not present

## 2013-11-10 DIAGNOSIS — E782 Mixed hyperlipidemia: Secondary | ICD-10-CM | POA: Diagnosis not present

## 2013-11-10 DIAGNOSIS — R072 Precordial pain: Secondary | ICD-10-CM | POA: Diagnosis not present

## 2013-11-10 DIAGNOSIS — I2510 Atherosclerotic heart disease of native coronary artery without angina pectoris: Secondary | ICD-10-CM | POA: Diagnosis not present

## 2013-11-10 DIAGNOSIS — F341 Dysthymic disorder: Secondary | ICD-10-CM | POA: Diagnosis not present

## 2013-11-15 ENCOUNTER — Ambulatory Visit: Payer: Medicare Other | Admitting: Internal Medicine

## 2013-11-21 DIAGNOSIS — I079 Rheumatic tricuspid valve disease, unspecified: Secondary | ICD-10-CM | POA: Diagnosis not present

## 2013-11-21 DIAGNOSIS — I369 Nonrheumatic tricuspid valve disorder, unspecified: Secondary | ICD-10-CM | POA: Diagnosis not present

## 2013-11-21 DIAGNOSIS — I059 Rheumatic mitral valve disease, unspecified: Secondary | ICD-10-CM | POA: Diagnosis not present

## 2013-11-21 DIAGNOSIS — G459 Transient cerebral ischemic attack, unspecified: Secondary | ICD-10-CM | POA: Diagnosis not present

## 2013-11-21 DIAGNOSIS — R209 Unspecified disturbances of skin sensation: Secondary | ICD-10-CM | POA: Diagnosis not present

## 2013-11-21 DIAGNOSIS — R29818 Other symptoms and signs involving the nervous system: Secondary | ICD-10-CM | POA: Diagnosis not present

## 2013-11-28 DIAGNOSIS — Z1231 Encounter for screening mammogram for malignant neoplasm of breast: Secondary | ICD-10-CM | POA: Diagnosis not present

## 2013-12-08 ENCOUNTER — Ambulatory Visit (INDEPENDENT_AMBULATORY_CARE_PROVIDER_SITE_OTHER): Payer: Medicare Other | Admitting: Internal Medicine

## 2013-12-08 ENCOUNTER — Encounter: Payer: Self-pay | Admitting: Internal Medicine

## 2013-12-08 ENCOUNTER — Encounter (HOSPITAL_COMMUNITY): Payer: Self-pay

## 2013-12-08 ENCOUNTER — Ambulatory Visit (HOSPITAL_COMMUNITY)
Admission: RE | Admit: 2013-12-08 | Discharge: 2013-12-08 | Disposition: A | Discharge status: Home / Self Care | Payer: Medicare Other | Source: Ambulatory Visit | Attending: Internal Medicine | Admitting: Internal Medicine

## 2013-12-08 VITALS — BP 94/62 | HR 71 | Temp 97.6°F | Ht 61.0 in | Wt 148.0 lb

## 2013-12-08 DIAGNOSIS — R0602 Shortness of breath: Secondary | ICD-10-CM

## 2013-12-08 DIAGNOSIS — R911 Solitary pulmonary nodule: Secondary | ICD-10-CM | POA: Diagnosis not present

## 2013-12-08 DIAGNOSIS — I2510 Atherosclerotic heart disease of native coronary artery without angina pectoris: Secondary | ICD-10-CM | POA: Insufficient documentation

## 2013-12-08 DIAGNOSIS — R0600 Dyspnea, unspecified: Secondary | ICD-10-CM

## 2013-12-08 DIAGNOSIS — Z9089 Acquired absence of other organs: Secondary | ICD-10-CM | POA: Insufficient documentation

## 2013-12-08 DIAGNOSIS — R918 Other nonspecific abnormal finding of lung field: Secondary | ICD-10-CM | POA: Insufficient documentation

## 2013-12-08 DIAGNOSIS — I700 Atherosclerosis of aorta: Secondary | ICD-10-CM | POA: Diagnosis not present

## 2013-12-08 DIAGNOSIS — I2584 Coronary atherosclerosis due to calcified coronary lesion: Secondary | ICD-10-CM | POA: Diagnosis not present

## 2013-12-08 DIAGNOSIS — R0989 Other specified symptoms and signs involving the circulatory and respiratory systems: Secondary | ICD-10-CM

## 2013-12-08 DIAGNOSIS — F17200 Nicotine dependence, unspecified, uncomplicated: Secondary | ICD-10-CM

## 2013-12-08 DIAGNOSIS — R0609 Other forms of dyspnea: Secondary | ICD-10-CM

## 2013-12-08 NOTE — Progress Notes (Signed)
Subjective:    Patient ID: Lindsay Brooks, female    DOB: 1950-10-08   MRN: [id number]    Brief patient profile:  28 yowf active smoker with sob x 2009 progressively worse referred 05/16/2013 by Dr Burt Knack f/u by Olivia Canter in [place] with abn CT / MPN   History of Present Illness  05/16/2013 1st [facility] office visit/ Westlyn Glaza cc indolent onset doe progressive  X 5 y  stops half way back to house from [place] which is [place]t [place] coming back x 166ft  and having trouble walking behind a selfpropelled mower for more than 20 min flat assoc with congested am cough prod of min white thick mucus x years also.  Has not tried saba. On mod doses of atenolol.  rec Stop atenolol Start bystolic one daily tomorrow in place of atenolol  The key is to stop smoking completely before smoking completely stops you!  Please schedule a follow up office visit in 4 weeks, sooner if needed with pft's   12/08/2013 f/u ov/Markus Casten re: unexplained sob / still smoking Chief Complaint  Patient presents with  . Followup with PFT    Pt reports cough and SOB are unchanged since last visit. No new co's today.   mailbox is downhill from house and stops half way, same distance going back up hill  With variable assoc ex midline chest pressure    eval by Dr Burt Knack 2013 and neg stress myoview and no worse since then  Some am congestion / minimal excess mucus, not purulent  No obvious day to day or daytime variabilty or assoc  subjective wheeze overt sinus or hb symptoms. No unusual exp hx or h/o childhood pna/ asthma or knowledge of premature birth.  Sleeping ok without nocturnal  or early am exacerbation  of respiratory  c/o's or need for noct saba. Also denies any obvious fluctuation of symptoms with weather or environmental changes or other aggravating or alleviating factors except as outlined above   Current Medications, Allergies, Complete Past Medical History, Past Surgical History, Family History, and Social History were  reviewed in [employer] record.  ROS  The following are not active complaints unless bolded sore throat, dysphagia, dental problems, itching, sneezing,  nasal congestion or excess/ purulent secretions, ear ache,   fever, chills, sweats, unintended wt loss, pleuritic or exertional cp, hemoptysis,  orthopnea pnd or leg swelling, presyncope, palpitations, heartburn, abdominal pain, anorexia, nausea, vomiting, diarrhea  or change in bowel or urinary habits, change in stools or urine, dysuria,hematuria,  rash, arthralgias, visual complaints, headache, numbness weakness or ataxia or problems with walking or coordination,  change in mood/affect or memory.                    Objective:   Physical Exam   Edentulous wf nad  12/08/2013         148  Wt Readings from Last 3 Encounters:  05/16/13 156 lb (70.761 kg)  06/21/12 153 lb (69.4 kg)  05/05/12 151 lb (68.493 kg)      HEENT mild turbinate edema.  Oropharynx no thrush or excess pnd or cobblestoning.  No JVD or cervical adenopathy. Mild accessory muscle hypertrophy. Trachea midline, nl thryroid. Chest was hyperinflated by percussion with diminished breath sounds and moderate increased exp time without wheeze. Hoover sign positive at mid inspiration. Regular rate and rhythm without murmur gallop or rub or increase P2 or edema.  Abd: no hsm, nl excursion. Ext warm without cyanosis or  clubbing.            Assessment & Plan:

## 2013-12-08 NOTE — Progress Notes (Signed)
PFT done today. 

## 2013-12-08 NOTE — Patient Instructions (Addendum)
Please see patient coordinator before you leave today  to schedule limited CT Chest of LUL nodule  No evidence of a significant lung problem at this point but : The key is to stop smoking completely before smoking completely stops you!   Pulmonary follow up is as needed - be sure to keep all your cardiac follow up

## 2013-12-09 ENCOUNTER — Encounter: Payer: Self-pay | Admitting: Internal Medicine

## 2013-12-09 NOTE — Assessment & Plan Note (Signed)
-   first detected  04/08/12 >>>  05/09/13 repeat/comparison Right upper lobe ground-glass nodule measures 1.6 cm,  image number 19/series 3. Previously 1.4 cm - 12/08/13 CT no change (1.5 cm) > rec complete 3 years of f/u 12/2014  (tickle file)

## 2013-12-09 NOTE — Assessment & Plan Note (Signed)
-   try off atenolol and on bystolic 25/8/52 - PFT's wnl 12/08/2013  - 12/08/2013  Walked RA x 3 laps @ 185 ft each stopped due to  Sob with sats 98%   Symptoms are markedly disproportionate to objective findings and this does not appear to be a lung problem  The hx that the sob/ chest discomfort is just as bad walking downhill as uphill (stops at the same location both ways to recover) is not at all c/w angina or a pulmonary limitation  Probably time to redo her cardiac eval and also check cbc/tsh/bmet/bnp to be complete but no further pulmonary f/u needed

## 2013-12-09 NOTE — Assessment & Plan Note (Signed)

## 2013-12-11 NOTE — Progress Notes (Signed)
Quick Note:  Spoke with pt and notified of results per Dr. Wert. Pt verbalized understanding and denied any questions.  ______ 

## 2013-12-13 DIAGNOSIS — J449 Chronic obstructive pulmonary disease, unspecified: Secondary | ICD-10-CM | POA: Diagnosis not present

## 2013-12-13 DIAGNOSIS — E1149 Type 2 diabetes mellitus with other diabetic neurological complication: Secondary | ICD-10-CM | POA: Diagnosis not present

## 2013-12-13 DIAGNOSIS — D492 Neoplasm of unspecified behavior of bone, soft tissue, and skin: Secondary | ICD-10-CM | POA: Diagnosis not present

## 2013-12-13 DIAGNOSIS — E1142 Type 2 diabetes mellitus with diabetic polyneuropathy: Secondary | ICD-10-CM | POA: Diagnosis not present

## 2013-12-15 LAB — PULMONARY FUNCTION TEST
DL/VA % pred: 94 %
DL/VA: 4.16 ml/min/mmHg/L
DLCO UNC % PRED: 87 %
DLCO UNC: 17.72 ml/min/mmHg
FEF 25-75 POST: 1.58 L/s
FEF 25-75 PRE: 0.98 L/s
FEF2575-%Change-Post: 60 %
FEF2575-%Pred-Post: 77 %
FEF2575-%Pred-Pre: 48 %
FEV1-%Change-Post: 6 %
FEV1-%PRED-POST: 83 %
FEV1-%Pred-Pre: 78 %
FEV1-Post: 1.83 L
FEV1-Pre: 1.72 L
FEV1FVC-%Change-Post: 8 %
FEV1FVC-%Pred-Pre: 95 %
FEV6-%Change-Post: 1 %
FEV6-%PRED-POST: 83 %
FEV6-%PRED-PRE: 82 %
FEV6-POST: 2.29 L
FEV6-Pre: 2.25 L
FEV6FVC-%CHANGE-POST: 4 %
FEV6FVC-%PRED-POST: 104 %
FEV6FVC-%Pred-Pre: 99 %
FVC-%CHANGE-POST: -2 %
FVC-%PRED-POST: 80 %
FVC-%PRED-PRE: 81 %
FVC-POST: 2.29 L
FVC-PRE: 2.34 L
PRE FEV1/FVC RATIO: 73 %
PRE FEV6/FVC RATIO: 96 %
Post FEV1/FVC ratio: 80 %
Post FEV6/FVC ratio: 100 %
RV % PRED: 132 %
RV: 2.52 L
TLC % pred: 107 %
TLC: 4.93 L

## 2014-01-10 ENCOUNTER — Encounter: Payer: Self-pay | Admitting: Cardiovascular Disease

## 2014-01-10 ENCOUNTER — Telehealth: Payer: Self-pay | Admitting: Cardiovascular Disease

## 2014-01-10 ENCOUNTER — Ambulatory Visit (INDEPENDENT_AMBULATORY_CARE_PROVIDER_SITE_OTHER): Payer: Medicare Other | Admitting: Cardiovascular Disease

## 2014-01-10 VITALS — BP 104/70 | HR 119 | Ht 61.0 in | Wt 145.0 lb

## 2014-01-10 DIAGNOSIS — I2510 Atherosclerotic heart disease of native coronary artery without angina pectoris: Secondary | ICD-10-CM

## 2014-01-10 DIAGNOSIS — R0600 Dyspnea, unspecified: Secondary | ICD-10-CM

## 2014-01-10 DIAGNOSIS — R0609 Other forms of dyspnea: Secondary | ICD-10-CM | POA: Diagnosis not present

## 2014-01-10 DIAGNOSIS — E785 Hyperlipidemia, unspecified: Secondary | ICD-10-CM

## 2014-01-10 DIAGNOSIS — R0989 Other specified symptoms and signs involving the circulatory and respiratory systems: Secondary | ICD-10-CM

## 2014-01-10 LAB — PROTIME-INR
INR: 1.1 ratio — AB (ref 0.8–1.0)
Prothrombin Time: 11.9 s (ref 9.6–13.1)

## 2014-01-10 LAB — CBC WITH DIFFERENTIAL/PLATELET
BASOS ABS: 0 10*3/uL (ref 0.0–0.1)
Basophils Relative: 0.4 % (ref 0.0–3.0)
Eosinophils Absolute: 0.2 10*3/uL (ref 0.0–0.7)
Eosinophils Relative: 2.1 % (ref 0.0–5.0)
HCT: 39 % (ref 36.0–46.0)
Hemoglobin: 13.1 g/dL (ref 12.0–15.0)
LYMPHS PCT: 30.7 % (ref 12.0–46.0)
Lymphs Abs: 2.7 10*3/uL (ref 0.7–4.0)
MCHC: 33.7 g/dL (ref 30.0–36.0)
MCV: 90.7 fl (ref 78.0–100.0)
MONOS PCT: 5.6 % (ref 3.0–12.0)
Monocytes Absolute: 0.5 10*3/uL (ref 0.1–1.0)
NEUTROS PCT: 61.2 % (ref 43.0–77.0)
Neutro Abs: 5.4 10*3/uL (ref 1.4–7.7)
PLATELETS: 214 10*3/uL (ref 150.0–400.0)
RBC: 4.3 Mil/uL (ref 3.87–5.11)
RDW: 14.2 % (ref 11.5–15.5)
WBC: 8.8 10*3/uL (ref 4.0–10.5)

## 2014-01-10 LAB — BASIC METABOLIC PANEL
BUN: 25 mg/dL — AB (ref 6–23)
CALCIUM: 10.1 mg/dL (ref 8.4–10.5)
CO2: 26 mEq/L (ref 19–32)
Chloride: 106 mEq/L (ref 96–112)
Creatinine, Ser: 1.4 mg/dL — ABNORMAL HIGH (ref 0.4–1.2)
GFR: 39.67 mL/min — AB (ref >60.00)
GLUCOSE: 78 mg/dL (ref 70–99)
Potassium: 3.5 mEq/L (ref 3.5–5.1)
Sodium: 142 mEq/L (ref 135–145)

## 2014-01-10 NOTE — Telephone Encounter (Signed)
I spoke with the pt and made her aware that she needs to remain on Plavix and take this the day of her procedure.

## 2014-01-10 NOTE — Progress Notes (Signed)
  HPI:  63-year-old woman presenting for followup evaluation. She was last seen 06/21/2012.  The patient has coronary and peripheral arterial disease. She has undergone stenting of the right coronary artery. She also has PAD and has undergone left iliac stenting. She had chest pain in 2013, and she underwent a Myoview stress scan at that time. This was demonstrated a left ventricular ejection fraction of 81% with normal homogenous uptake in all areas of the myocardium. The patient was noted to have poor exercise tolerance.  The patient had an echo performed 11/21/2013, showing normal LV systolic function with LVEF 60-65%, no evidence of LVH, and pseudonormal LV filling pattern consistent with diastolic dysfunction.  The patient complains of worsening chest pain. She had an episode about 2 weeks ago where she had substernal tightness associated with numbness in her left arm and even in her left leg. This lasted about 5 minutes. She has noted increasing chest pain with almost any activities. Her husband is in poor health and she has to do most of the work around the house. She experiences chest discomfort with yard work, carrying groceries, and other daily chores. She has chronic shortness of breath and continues to smoke one pack of cigarettes daily. She denies edema, orthopnea, or PND.   Outpatient Encounter Prescriptions as of 01/10/2014  Medication Sig  . aspirin EC 81 MG tablet Take 81 mg by mouth daily.  . atenolol (TENORMIN) 50 MG tablet 1 1/2 tablet twice daily  . clopidogrel (PLAVIX) 75 MG tablet Take 75 mg by mouth daily at 12 noon.   . DULoxetine (CYMBALTA) 60 MG capsule Take 120 mg by mouth at bedtime.   . esomeprazole (NEXIUM) 40 MG capsule Take 40 mg by mouth 2 (two) times daily.   . fenofibrate (TRICOR) 145 MG tablet Take 145 mg by mouth daily.  . fluticasone (FLONASE) 50 MCG/ACT nasal spray Place 2 sprays into the nose daily.  . gabapentin (NEURONTIN) 600 MG tablet Take 600 mg by mouth  3 (three) times daily.  . hydrochlorothiazide 25 MG tablet Take 25 mg by mouth daily.    . LORazepam (ATIVAN) 0.5 MG tablet Take 0.5 mg by mouth daily as needed. For anxiety  . nitroGLYCERIN (NITROSTAT) 0.4 MG SL tablet Place 0.4 mg under the tongue every 5 (five) minutes as needed. For chest pain  . potassium chloride (MICRO-K) 10 MEQ CR capsule Take 10 mEq by mouth daily at 12 noon.   . QUEtiapine (SEROQUEL) 50 MG tablet Take 50 mg by mouth at bedtime.    . rosuvastatin (CRESTOR) 20 MG tablet Take 20 mg by mouth at bedtime.  . sitaGLIPtin-metformin (JANUMET) 50-1000 MG per tablet Take 2 tablets by mouth daily.  . valsartan (DIOVAN) 320 MG tablet Take 320 mg by mouth daily at 12 noon.   . Vitamin D, Ergocalciferol, (DRISDOL) 50000 UNITS CAPS capsule Take 50,000 Units by mouth every 7 (seven) days.  . zolpidem (AMBIEN) 10 MG tablet Take 10 mg by mouth at bedtime as needed. For sleep    Allergies  Allergen Reactions  . Celecoxib Hives  . Insulins Nausea And Vomiting    Patient unsure of name of insulin, will call us back  . Vioxx [Rofecoxib] Hives    Past Medical History  Diagnosis Date  . Peripheral vascular disease, unspecified     a. s/p L common femoral & left illiac stenting previously (patent by angio in 2007). - ABI 06/2011 normal. b. Carotid US 0-39% BICA 06/2011 (for f/u   2 yrs);   c.  ABIs 9/13:  normal (1.1 bilaterally)  . Type II or unspecified type diabetes mellitus without mention of complication, not stated as uncontrolled   . GERD (gastroesophageal reflux disease)   . Tobacco use disorder   . Personal history of peptic ulcer disease     Remotely.  . Other isolated or specific phobias   . Mini stroke     History of, in 2005 (before she was diagnosed with CAD).  . Other and unspecified hyperlipidemia   . Unspecified essential hypertension   . CAD (coronary artery disease)     a. s/p 2 CoStar DES to RCA 06/2004. b.  LHC 12/09:  EF 65%, oCFX 40%, oRCA 30%, then 30-40%  leading into stented seg that is patent, (10-20% ISR), 40% beyond stent,;  c. EF 81%, poor exercise capacity, hypotensive BP response, + chest pain, normal nuclear images (overall low risk)  . Colon polyps     a. s/p removal 01/2012 - was told one might be cancerous but was not diagnosed with colon cancer - plan is for f/u colonoscopy in 5 yrs.  . Hx of echocardiogram     a. Echo 06/2011: EF 55-60%, Gr 1 diast dysfn, mild MR    ROS: Negative except as per HPI  BP 104/70  Pulse 119  Ht 5' 1" (1.549 m)  Wt 65.772 kg (145 lb)  BMI 27.41 kg/m2  PHYSICAL EXAM: Pt is alert and oriented, NAD HEENT: normal Neck: JVP - normal, carotids 2+= without bruits Lungs: Diminished bilaterally, no rales CV: RRR without murmur or gallop Abd: soft, NT, Positive BS, no hepatomegaly Ext: no C/C/E, distal pulses intact and equal Skin: warm/dry no rash  EKG:  Sinus tachycardia heart rate 119 beats per minute, incomplete right bundle branch block, cannot rule out inferior infarct age undetermined.  CT Chest 12/08/2013: IMPRESSION:  1. Persistent ground-glass attenuating nodule within the right upper  lobe. Low-grade adenocarcinoma of the lung may present as a pure  ground-glass nodule. For this reason, this nodule will require  annual surveillance for minimum of 36 months to confirm stability.  Today's study represents 21 months of stability. Initial follow-up  by chest CT without contrast is recommended in 3 months to confirm  persistence. This recommendation follows the consensus statement:  Recommendations for the Management of Subsolid Pulmonary Nodules  Detected at CT: A Statement from the Fleischner Society as published  in Radiology 2013; 266:304-317.  2. Stable small solid nodule compatible with a benign abnormality.  This requires no further followup.  3. Atherosclerotic disease including RCA Coronary artery  calcifications.   ASSESSMENT AND PLAN: 1. CAD, native vessel.  The patient has  progressive now CCS class III symptoms of angina. She had a normal stress nuclear scan in 2013. However, she clearly has had accelerated symptoms. She's treated with aspirin, Plavix, statin drug, and the beta blocker. There is some question about whether she is still taking Plavix. The patient does not have her medications with her but we will contact her after this visit to make sure.  We discussed diagnostic considerations and I would favor definitive evaluation with cardiac catheterization. The patient is strongly in favor of this as well. She's very concerned about her marked limitation. I have reviewed the risks, indications, and alternatives to cardiac catheterization and possible PCI. She understands and agrees to proceed. This will be scheduled at the next available time.  2. Tobacco abuse. Continues to be an issue. Tobacco cessation counseling   done. Unlikely to quit.  3. Hyperlipidemia. The patient is on a combination of TriCor and Crestor. She is followed by her primary care physician.   4. Hypertension. Blood pressure is well controlled on a combination of hydrochlorothiazide, atenolol, and Diovan.   Lindsay Brooks 01/10/2014 9:36 AM     

## 2014-01-10 NOTE — Telephone Encounter (Signed)
New Message:  Pt called to let us know she is taking plavix 75mg ... Pt is requesting a call back from [place].

## 2014-01-10 NOTE — Patient Instructions (Addendum)
Your physician has requested that you have a cardiac catheterization. Cardiac catheterization is used to diagnose and/or treat various heart conditions. Doctors may recommend this procedure for a number of different reasons. The most common reason is to evaluate chest pain. Chest pain can be a symptom of coronary artery disease (CAD), and cardiac catheterization can show whether plaque is narrowing or blocking your heart's arteries. This procedure is also used to evaluate the valves, as well as measure the blood flow and oxygen levels in different parts of your heart. For further information please visit [email or web address]. Please follow instruction sheet, as given.  Please review your medications at home and determine if you are taking Plavix/Clopidogrel.  Please call the office at [phone] and let us know if you are or are not taking this medication.  Your physician recommends that you have lab work today: BMP, CBC and PT/INR

## 2014-01-11 ENCOUNTER — Encounter (HOSPITAL_COMMUNITY): Payer: Self-pay | Admitting: Pharmacy Technician

## 2014-01-11 DIAGNOSIS — L259 Unspecified contact dermatitis, unspecified cause: Secondary | ICD-10-CM | POA: Diagnosis not present

## 2014-01-11 DIAGNOSIS — L981 Factitial dermatitis: Secondary | ICD-10-CM | POA: Diagnosis not present

## 2014-01-16 ENCOUNTER — Encounter (HOSPITAL_COMMUNITY)
Admission: RE | Disposition: A | Discharge status: Home / Self Care | Payer: Self-pay | Source: Ambulatory Visit | Attending: Cardiovascular Disease

## 2014-01-16 ENCOUNTER — Ambulatory Visit (HOSPITAL_COMMUNITY)
Admission: RE | Admit: 2014-01-16 | Discharge: 2014-01-16 | Disposition: A | Discharge status: Home / Self Care | Payer: Medicare Other | Source: Ambulatory Visit | Attending: Cardiovascular Disease | Admitting: Cardiovascular Disease

## 2014-01-16 DIAGNOSIS — R911 Solitary pulmonary nodule: Secondary | ICD-10-CM | POA: Insufficient documentation

## 2014-01-16 DIAGNOSIS — K219 Gastro-esophageal reflux disease without esophagitis: Secondary | ICD-10-CM | POA: Insufficient documentation

## 2014-01-16 DIAGNOSIS — F17200 Nicotine dependence, unspecified, uncomplicated: Secondary | ICD-10-CM | POA: Insufficient documentation

## 2014-01-16 DIAGNOSIS — Z8673 Personal history of transient ischemic attack (TIA), and cerebral infarction without residual deficits: Secondary | ICD-10-CM | POA: Diagnosis not present

## 2014-01-16 DIAGNOSIS — I739 Peripheral vascular disease, unspecified: Secondary | ICD-10-CM | POA: Insufficient documentation

## 2014-01-16 DIAGNOSIS — E785 Hyperlipidemia, unspecified: Secondary | ICD-10-CM | POA: Diagnosis not present

## 2014-01-16 DIAGNOSIS — N189 Chronic kidney disease, unspecified: Secondary | ICD-10-CM | POA: Insufficient documentation

## 2014-01-16 DIAGNOSIS — I2510 Atherosclerotic heart disease of native coronary artery without angina pectoris: Secondary | ICD-10-CM | POA: Insufficient documentation

## 2014-01-16 DIAGNOSIS — I209 Angina pectoris, unspecified: Secondary | ICD-10-CM | POA: Insufficient documentation

## 2014-01-16 DIAGNOSIS — I129 Hypertensive chronic kidney disease with stage 1 through stage 4 chronic kidney disease, or unspecified chronic kidney disease: Secondary | ICD-10-CM | POA: Insufficient documentation

## 2014-01-16 DIAGNOSIS — Z7982 Long term (current) use of aspirin: Secondary | ICD-10-CM | POA: Insufficient documentation

## 2014-01-16 DIAGNOSIS — T82897A Other specified complication of cardiac prosthetic devices, implants and grafts, initial encounter: Secondary | ICD-10-CM | POA: Diagnosis not present

## 2014-01-16 DIAGNOSIS — E119 Type 2 diabetes mellitus without complications: Secondary | ICD-10-CM | POA: Diagnosis not present

## 2014-01-16 DIAGNOSIS — Y831 Surgical operation with implant of artificial internal device as the cause of abnormal reaction of the patient, or of later complication, without mention of misadventure at the time of the procedure: Secondary | ICD-10-CM | POA: Insufficient documentation

## 2014-01-16 DIAGNOSIS — Z7902 Long term (current) use of antithrombotics/antiplatelets: Secondary | ICD-10-CM | POA: Diagnosis not present

## 2014-01-16 HISTORY — PX: LEFT HEART CATHETERIZATION WITH CORONARY ANGIOGRAM: SHX5451

## 2014-01-16 LAB — GLUCOSE, CAPILLARY: Glucose-Capillary: 132 mg/dL — ABNORMAL HIGH (ref 70–99)

## 2014-01-16 SURGERY — LEFT HEART CATHETERIZATION WITH CORONARY ANGIOGRAM
Anesthesia: LOCAL

## 2014-01-16 MED ORDER — HEPARIN SODIUM (PORCINE) 1000 UNIT/ML IJ SOLN
INTRAMUSCULAR | Status: AC
  Filled 2014-01-16: qty 1

## 2014-01-16 MED ORDER — SODIUM CHLORIDE 0.9 % IJ SOLN: 3.0000 mL | Freq: Two times a day (BID) | INTRAMUSCULAR | Status: DC

## 2014-01-16 MED ORDER — SODIUM CHLORIDE 0.9 % IV SOLN: 1.0000 mL/kg/h | INTRAVENOUS | Status: DC

## 2014-01-16 MED ORDER — ACETAMINOPHEN 325 MG PO TABS: 650.0000 mg | ORAL_TABLET | ORAL | Status: DC | PRN

## 2014-01-16 MED ORDER — SODIUM CHLORIDE 0.9 % IV SOLN
INTRAVENOUS | Status: DC
  Administered 2014-01-16: 09:00:00 via INTRAVENOUS

## 2014-01-16 MED ORDER — SODIUM CHLORIDE 0.9 % IJ SOLN: 3.0000 mL | INTRAMUSCULAR | Status: DC | PRN

## 2014-01-16 MED ORDER — FENTANYL CITRATE 0.05 MG/ML IJ SOLN
INTRAMUSCULAR | Status: AC
  Filled 2014-01-16: qty 2

## 2014-01-16 MED ORDER — ONDANSETRON HCL 4 MG/2ML IJ SOLN: 4.0000 mg | Freq: Four times a day (QID) | INTRAMUSCULAR | Status: DC | PRN

## 2014-01-16 MED ORDER — LIDOCAINE HCL (PF) 1 % IJ SOLN
INTRAMUSCULAR | Status: AC
  Filled 2014-01-16: qty 30

## 2014-01-16 MED ORDER — NITROGLYCERIN 0.2 MG/ML ON CALL CATH LAB
INTRAVENOUS | Status: AC
  Filled 2014-01-16: qty 1

## 2014-01-16 MED ORDER — HEPARIN (PORCINE) IN NACL 2-0.9 UNIT/ML-% IJ SOLN
INTRAMUSCULAR | Status: AC
  Filled 2014-01-16: qty 1000

## 2014-01-16 MED ORDER — VERAPAMIL HCL 2.5 MG/ML IV SOLN
INTRAVENOUS | Status: AC
  Filled 2014-01-16: qty 2

## 2014-01-16 MED ORDER — SODIUM CHLORIDE 0.9 % IV SOLN: 250.0000 mL | INTRAVENOUS | Status: DC | PRN

## 2014-01-16 MED ORDER — MIDAZOLAM HCL 2 MG/2ML IJ SOLN
INTRAMUSCULAR | Status: AC
  Filled 2014-01-16: qty 2

## 2014-01-16 NOTE — Research (Signed)
AVERT Informed Consent   Subject Name: Lindsay Brooks  Subject met inclusion and exclusion criteria.  The informed consent form, study requirements and expectations were reviewed with the subject and questions and concerns were addressed prior to the signing of the consent form.  The subject verbalized understanding of the trail requirements.  The subject agreed to participate in the AVERT trial and signed the informed consent.  The informed consent was obtained prior to performance of any protocol-specific procedures for the subject.  A copy of the signed informed consent was given to the subject and a copy was placed in the subject's medical record.  Hugh Pruitt, RN 01/16/2014, 08:20AM  

## 2014-01-16 NOTE — CV Procedure (Signed)
    Cardiac Catheterization Procedure Note  Name: Lindsay Brooks MRN: [id number] DOB: 1951-07-04  Procedure: Left Heart Cath, Selective Coronary Angiography  Indication: Exertional angina   Procedural Details: The right wrist was prepped, draped, and anesthetized with 1% lidocaine. Using the modified Seldinger technique, a 5/6 French sheath was introduced into the right radial artery. 3 mg of verapamil was administered through the sheath, weight-based unfractionated heparin was administered intravenously. Standard Judkins catheters were used for selective coronary angiography. Catheter exchanges were performed over an exchange length guidewire. There were no immediate procedural complications. A TR band was used for radial hemostasis at the completion of the procedure.  The patient was transferred to the post catheterization recovery area for further monitoring.  Procedural Findings: Hemodynamics: AO 114/60 LV 113/2  Coronary angiography: Coronary dominance: right  Left mainstem: The left main is small in caliber and has diffuse 30-40% stenosis. There is no significant calcification. The vessel divides into the LAD and left circumflex.  Left anterior descending (LAD): The proximal LAD is dilated without obstructive disease. The LAD is patent to the distal anterior wall. The first diagonal is large in caliber and covers a large territory of myocardium. There is no obstructive disease visualized throughout the LAD distribution.  Left circumflex (LCx): The left circumflex is patent throughout. There were 2 obtuse marginal branches with no significant stenosis.  Right coronary artery (RCA): This is a large, dominant vessel. The proximal vessel has minor irregularity. The stented segment has minor in-stent restenosis but no significant obstruction noted. Beyond the stented segment in the distal RCA there is a 50% stenosis which is unchanged from the previous study. The PDA and PLA branches  cover a large territory of myocardium and have no significant obstruction.  Left ventriculography: Deferred because of mild chronic kidney disease  Final Conclusions:   1. Mild diffuse left main stenosis 2. Widely patent LAD and left circumflex 3. Continued patency of the stented segment in the right coronary artery with moderate nonobstructive distal vessel stenosis.  Recommendations: Favor continued medical therapy. Tobacco cessation will be important her long-term outcome. I do not think PCI will improve her anginal symptoms.  [name] 01/16/2014, 11:17 AM

## 2014-01-16 NOTE — H&P (View-Only) (Signed)
HPI:  63 year old woman presenting for followup evaluation. She was last seen 06/21/2012.  The patient has coronary and peripheral arterial disease. She has undergone stenting of the right coronary artery. She also has PAD and has undergone left iliac stenting. She had chest pain in 2013, and she underwent a Myoview stress scan at that time. This was demonstrated a left ventricular ejection fraction of 81% with normal homogenous uptake in all areas of the myocardium. The patient was noted to have poor exercise tolerance.  The patient had an echo performed 11/21/2013, showing normal LV systolic function with LVEF 60-65%, no evidence of LVH, and pseudonormal LV filling pattern consistent with diastolic dysfunction.  The patient complains of worsening chest pain. She had an episode about 2 weeks ago where she had substernal tightness associated with numbness in her left arm and even in her left leg. This lasted about 5 minutes. She has noted increasing chest pain with almost any activities. Her husband is in poor health and she has to do most of the work around the house. She experiences chest discomfort with yard work, carrying groceries, and other daily chores. She has chronic shortness of breath and continues to smoke one pack of cigarettes daily. She denies edema, orthopnea, or PND.   Outpatient Encounter Prescriptions as of 01/10/2014  Medication Sig  . aspirin EC 81 MG tablet Take 81 mg by mouth daily.  Marland Kitchen atenolol (TENORMIN) 50 MG tablet 1 1/2 tablet twice daily  . clopidogrel (PLAVIX) 75 MG tablet Take 75 mg by mouth daily at 12 noon.   . DULoxetine (CYMBALTA) 60 MG capsule Take 120 mg by mouth at bedtime.   Marland Kitchen esomeprazole (NEXIUM) 40 MG capsule Take 40 mg by mouth 2 (two) times daily.   . fenofibrate (TRICOR) 145 MG tablet Take 145 mg by mouth daily.  . fluticasone (FLONASE) 50 MCG/ACT nasal spray Place 2 sprays into the nose daily.  Marland Kitchen gabapentin (NEURONTIN) 600 MG tablet Take 600 mg by mouth  3 (three) times daily.  . hydrochlorothiazide 25 MG tablet Take 25 mg by mouth daily.    Marland Kitchen LORazepam (ATIVAN) 0.5 MG tablet Take 0.5 mg by mouth daily as needed. For anxiety  . nitroGLYCERIN (NITROSTAT) 0.4 MG SL tablet Place 0.4 mg under the tongue every 5 (five) minutes as needed. For chest pain  . potassium chloride (MICRO-K) 10 MEQ CR capsule Take 10 mEq by mouth daily at 12 noon.   . QUEtiapine (SEROQUEL) 50 MG tablet Take 50 mg by mouth at bedtime.    . rosuvastatin (CRESTOR) 20 MG tablet Take 20 mg by mouth at bedtime.  . sitaGLIPtin-metformin (JANUMET) 50-1000 MG per tablet Take 2 tablets by mouth daily.  . valsartan (DIOVAN) 320 MG tablet Take 320 mg by mouth daily at 12 noon.   . Vitamin D, Ergocalciferol, (DRISDOL) 50000 UNITS CAPS capsule Take 50,000 Units by mouth every 7 (seven) days.  Marland Kitchen zolpidem (AMBIEN) 10 MG tablet Take 10 mg by mouth at bedtime as needed. For sleep    Allergies  Allergen Reactions  . Celecoxib Hives  . Insulins Nausea And Vomiting    Patient unsure of name of insulin, will call us back  . Vioxx [Rofecoxib] Hives    Past Medical History  Diagnosis Date  . Peripheral vascular disease, unspecified     a. s/p L common femoral & left illiac stenting previously (patent by angio in 2007). - ABI 06/2011 normal. b. Carotid US 0-39% BICA 06/2011 (for f/u  2 yrs);   c.  ABIs 9/13:  normal (1.1 bilaterally)  . Type II or unspecified type diabetes mellitus without mention of complication, not stated as uncontrolled   . GERD (gastroesophageal reflux disease)   . Tobacco use disorder   . Personal history of peptic ulcer disease     Remotely.  . Other isolated or specific phobias   . Mini stroke     History of, in 2005 (before she was diagnosed with CAD).  . Other and unspecified hyperlipidemia   . Unspecified essential hypertension   . CAD (coronary artery disease)     a. s/p 2 CoStar DES to RCA 06/2004. b.  LHC 12/09:  EF 65%, oCFX 40%, oRCA 30%, then 30-40%  leading into stented seg that is patent, (10-20% ISR), 40% beyond stent,;  c. EF 81%, poor exercise capacity, hypotensive BP response, + chest pain, normal nuclear images (overall low risk)  . Colon polyps     a. s/p removal 01/2012 - was told one might be cancerous but was not diagnosed with colon cancer - plan is for f/u colonoscopy in 5 yrs.  Marland Kitchen Hx of echocardiogram     a. Echo 06/2011: EF 55-60%, Gr 1 diast dysfn, mild MR    ROS: Negative except as per HPI  BP 104/70  Pulse 119  Ht 5\' 1"  (1.549 m)  Wt 65.772 kg (145 lb)  BMI 27.41 kg/m2  PHYSICAL EXAM: Pt is alert and oriented, NAD HEENT: normal Neck: JVP - normal, carotids 2+= without bruits Lungs: Diminished bilaterally, no rales CV: RRR without murmur or gallop Abd: soft, NT, Positive BS, no hepatomegaly Ext: no C/C/E, distal pulses intact and equal Skin: warm/dry no rash  EKG:  Sinus tachycardia heart rate 119 beats per minute, incomplete right bundle branch block, cannot rule out inferior infarct age undetermined.  CT Chest 12/08/2013: IMPRESSION:  1. Persistent ground-glass attenuating nodule within the right upper  lobe. Low-grade adenocarcinoma of the lung may present as a pure  ground-glass nodule. For this reason, this nodule will require  annual surveillance for minimum of 36 months to confirm stability.  Today's study represents 21 months of stability. Initial follow-up  by chest CT without contrast is recommended in 3 months to confirm  persistence. This recommendation follows the consensus statement:  Recommendations for the Management of Subsolid Pulmonary Nodules  Detected at CT: A Statement from the [employer] as published  in Radiology 2013; 266:304-317.  2. Stable small solid nodule compatible with a benign abnormality.  This requires no further followup.  3. Atherosclerotic disease including RCA Coronary artery  calcifications.   ASSESSMENT AND PLAN: 1. CAD, native vessel.  The patient has  progressive now CCS class III symptoms of angina. She had a normal stress nuclear scan in 2013. However, she clearly has had accelerated symptoms. She's treated with aspirin, Plavix, statin drug, and the beta blocker. There is some question about whether she is still taking Plavix. The patient does not have her medications with her but we will contact her after this visit to make sure.  We discussed diagnostic considerations and I would favor definitive evaluation with cardiac catheterization. The patient is strongly in favor of this as well. She's very concerned about her marked limitation. I have reviewed the risks, indications, and alternatives to cardiac catheterization and possible PCI. She understands and agrees to proceed. This will be scheduled at the next available time.  2. Tobacco abuse. Continues to be an issue. Tobacco cessation counseling  done. Unlikely to quit.  3. Hyperlipidemia. The patient is on a combination of TriCor and Crestor. She is followed by her primary care physician.   4. Hypertension. Blood pressure is well controlled on a combination of hydrochlorothiazide, atenolol, and Diovan.   [name] 01/10/2014 9:36 AM

## 2014-01-16 NOTE — Interval H&P Note (Signed)
History and Physical Interval Note:  01/16/2014 10:43 AM  Lindsay Brooks  has presented today for surgery, with the diagnosis of shortness of breath/cad  The various methods of treatment have been discussed with the patient and family. After consideration of risks, benefits and other options for treatment, the patient has consented to  Procedure(s): LEFT HEART CATHETERIZATION WITH CORONARY ANGIOGRAM (N/A) as a surgical intervention .  The patient's history has been reviewed, patient examined, no change in status, stable for surgery.  I have reviewed the patient's chart and labs.  Questions were answered to the patient's satisfaction.    Cath Lab Visit (complete for each Cath Lab visit)  Clinical Evaluation Leading to the Procedure:   ACS: no  Non-ACS:    Anginal Classification: CCS III  Anti-ischemic medical therapy: Minimal Therapy (1 class of medications)  Non-Invasive Test Results: No non-invasive testing performed  Prior CABG: No previous CABG       [name]

## 2014-01-16 NOTE — Discharge Instructions (Signed)

## 2014-01-25 DIAGNOSIS — L981 Factitial dermatitis: Secondary | ICD-10-CM | POA: Diagnosis not present

## 2014-01-26 IMAGING — NM NM MISC PROCEDURE
1 series · 12 of 12 positions shown · non-contrast
Comparison: none

[Series 1: rest raw · 6.40mm/px · 2 acquisitions, 12 frames shown]
[im 1/2]
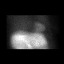
[im 1/2]
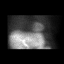
[im 1/2]
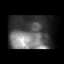
[im 1/2]
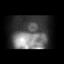
[im 1/2]
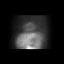
[im 1/2]
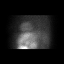
[im 2/2]
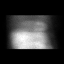
[im 2/2]
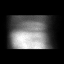
[im 2/2]
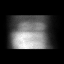
[im 2/2]
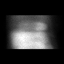
[im 2/2]
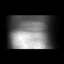
[im 2/2]
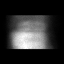

[12 of 12 positions shown; findings below may reference images not displayed]

Canned report from images found in remote index.

Refer to host system for actual result text.

## 2014-02-23 DIAGNOSIS — E119 Type 2 diabetes mellitus without complications: Secondary | ICD-10-CM | POA: Diagnosis not present

## 2014-02-23 DIAGNOSIS — F341 Dysthymic disorder: Secondary | ICD-10-CM | POA: Diagnosis not present

## 2014-02-23 DIAGNOSIS — I2510 Atherosclerotic heart disease of native coronary artery without angina pectoris: Secondary | ICD-10-CM | POA: Diagnosis not present

## 2014-02-23 DIAGNOSIS — N3000 Acute cystitis without hematuria: Secondary | ICD-10-CM | POA: Diagnosis not present

## 2014-02-23 DIAGNOSIS — N390 Urinary tract infection, site not specified: Secondary | ICD-10-CM | POA: Diagnosis not present

## 2014-02-23 DIAGNOSIS — E1149 Type 2 diabetes mellitus with other diabetic neurological complication: Secondary | ICD-10-CM | POA: Diagnosis not present

## 2014-02-23 DIAGNOSIS — E1142 Type 2 diabetes mellitus with diabetic polyneuropathy: Secondary | ICD-10-CM | POA: Diagnosis not present

## 2014-02-23 DIAGNOSIS — E7800 Pure hypercholesterolemia, unspecified: Secondary | ICD-10-CM | POA: Diagnosis not present

## 2014-02-23 DIAGNOSIS — I10 Essential (primary) hypertension: Secondary | ICD-10-CM | POA: Diagnosis not present

## 2014-02-23 DIAGNOSIS — E782 Mixed hyperlipidemia: Secondary | ICD-10-CM | POA: Diagnosis not present

## 2014-05-08 DIAGNOSIS — H2510 Age-related nuclear cataract, unspecified eye: Secondary | ICD-10-CM | POA: Diagnosis not present

## 2014-05-08 DIAGNOSIS — E119 Type 2 diabetes mellitus without complications: Secondary | ICD-10-CM | POA: Diagnosis not present

## 2014-06-01 DIAGNOSIS — I119 Hypertensive heart disease without heart failure: Secondary | ICD-10-CM | POA: Diagnosis not present

## 2014-06-01 DIAGNOSIS — Z23 Encounter for immunization: Secondary | ICD-10-CM | POA: Diagnosis not present

## 2014-06-01 DIAGNOSIS — E782 Mixed hyperlipidemia: Secondary | ICD-10-CM | POA: Diagnosis not present

## 2014-06-01 DIAGNOSIS — E1140 Type 2 diabetes mellitus with diabetic neuropathy, unspecified: Secondary | ICD-10-CM | POA: Diagnosis not present

## 2014-06-01 DIAGNOSIS — I2510 Atherosclerotic heart disease of native coronary artery without angina pectoris: Secondary | ICD-10-CM | POA: Diagnosis not present

## 2014-06-01 DIAGNOSIS — F341 Dysthymic disorder: Secondary | ICD-10-CM | POA: Diagnosis not present

## 2014-07-19 ENCOUNTER — Encounter (HOSPITAL_COMMUNITY): Payer: Self-pay | Admitting: Cardiovascular Disease

## 2014-07-20 DIAGNOSIS — H2512 Age-related nuclear cataract, left eye: Secondary | ICD-10-CM | POA: Diagnosis not present

## 2014-08-10 HISTORY — PX: CATARACT EXTRACTION, BILATERAL: SHX1313

## 2014-10-02 DIAGNOSIS — H25812 Combined forms of age-related cataract, left eye: Secondary | ICD-10-CM | POA: Diagnosis not present

## 2014-10-02 DIAGNOSIS — H2512 Age-related nuclear cataract, left eye: Secondary | ICD-10-CM | POA: Diagnosis not present

## 2014-10-02 DIAGNOSIS — Z79899 Other long term (current) drug therapy: Secondary | ICD-10-CM | POA: Diagnosis not present

## 2014-10-02 DIAGNOSIS — E119 Type 2 diabetes mellitus without complications: Secondary | ICD-10-CM | POA: Diagnosis not present

## 2014-10-02 DIAGNOSIS — H259 Unspecified age-related cataract: Secondary | ICD-10-CM | POA: Diagnosis not present

## 2014-10-02 DIAGNOSIS — Z8673 Personal history of transient ischemic attack (TIA), and cerebral infarction without residual deficits: Secondary | ICD-10-CM | POA: Diagnosis not present

## 2014-10-02 DIAGNOSIS — J45909 Unspecified asthma, uncomplicated: Secondary | ICD-10-CM | POA: Diagnosis not present

## 2014-10-02 DIAGNOSIS — G629 Polyneuropathy, unspecified: Secondary | ICD-10-CM | POA: Diagnosis not present

## 2014-10-02 DIAGNOSIS — Z7902 Long term (current) use of antithrombotics/antiplatelets: Secondary | ICD-10-CM | POA: Diagnosis not present

## 2014-10-02 DIAGNOSIS — Z955 Presence of coronary angioplasty implant and graft: Secondary | ICD-10-CM | POA: Diagnosis not present

## 2014-10-02 DIAGNOSIS — I10 Essential (primary) hypertension: Secondary | ICD-10-CM | POA: Diagnosis not present

## 2014-10-02 DIAGNOSIS — Z7982 Long term (current) use of aspirin: Secondary | ICD-10-CM | POA: Diagnosis not present

## 2014-10-02 DIAGNOSIS — K219 Gastro-esophageal reflux disease without esophagitis: Secondary | ICD-10-CM | POA: Diagnosis not present

## 2014-10-02 DIAGNOSIS — I739 Peripheral vascular disease, unspecified: Secondary | ICD-10-CM | POA: Diagnosis not present

## 2014-10-09 DIAGNOSIS — E119 Type 2 diabetes mellitus without complications: Secondary | ICD-10-CM | POA: Diagnosis not present

## 2014-10-09 DIAGNOSIS — E1140 Type 2 diabetes mellitus with diabetic neuropathy, unspecified: Secondary | ICD-10-CM | POA: Diagnosis not present

## 2014-10-09 DIAGNOSIS — I10 Essential (primary) hypertension: Secondary | ICD-10-CM | POA: Diagnosis not present

## 2014-10-09 DIAGNOSIS — I119 Hypertensive heart disease without heart failure: Secondary | ICD-10-CM | POA: Diagnosis not present

## 2014-10-09 DIAGNOSIS — I2510 Atherosclerotic heart disease of native coronary artery without angina pectoris: Secondary | ICD-10-CM | POA: Diagnosis not present

## 2014-10-09 DIAGNOSIS — F341 Dysthymic disorder: Secondary | ICD-10-CM | POA: Diagnosis not present

## 2014-10-09 DIAGNOSIS — E559 Vitamin D deficiency, unspecified: Secondary | ICD-10-CM | POA: Diagnosis not present

## 2014-10-09 DIAGNOSIS — E782 Mixed hyperlipidemia: Secondary | ICD-10-CM | POA: Diagnosis not present

## 2014-10-09 DIAGNOSIS — R000 Tachycardia, unspecified: Secondary | ICD-10-CM | POA: Diagnosis not present

## 2014-10-30 DIAGNOSIS — R000 Tachycardia, unspecified: Secondary | ICD-10-CM | POA: Diagnosis not present

## 2014-10-30 DIAGNOSIS — I119 Hypertensive heart disease without heart failure: Secondary | ICD-10-CM | POA: Diagnosis not present

## 2014-10-30 DIAGNOSIS — I10 Essential (primary) hypertension: Secondary | ICD-10-CM | POA: Diagnosis not present

## 2014-11-08 ENCOUNTER — Other Ambulatory Visit: Payer: Self-pay | Admitting: Internal Medicine

## 2014-11-08 DIAGNOSIS — R911 Solitary pulmonary nodule: Secondary | ICD-10-CM

## 2014-11-20 DIAGNOSIS — I119 Hypertensive heart disease without heart failure: Secondary | ICD-10-CM | POA: Diagnosis not present

## 2014-11-20 DIAGNOSIS — R05 Cough: Secondary | ICD-10-CM | POA: Diagnosis not present

## 2014-11-20 DIAGNOSIS — J441 Chronic obstructive pulmonary disease with (acute) exacerbation: Secondary | ICD-10-CM | POA: Diagnosis not present

## 2014-11-20 DIAGNOSIS — H532 Diplopia: Secondary | ICD-10-CM | POA: Diagnosis not present

## 2014-11-20 DIAGNOSIS — R0602 Shortness of breath: Secondary | ICD-10-CM | POA: Diagnosis not present

## 2014-11-20 DIAGNOSIS — I10 Essential (primary) hypertension: Secondary | ICD-10-CM | POA: Diagnosis not present

## 2014-11-20 DIAGNOSIS — R42 Dizziness and giddiness: Secondary | ICD-10-CM | POA: Diagnosis not present

## 2014-11-27 DIAGNOSIS — R9082 White matter disease, unspecified: Secondary | ICD-10-CM | POA: Diagnosis not present

## 2014-11-27 DIAGNOSIS — R51 Headache: Secondary | ICD-10-CM | POA: Diagnosis not present

## 2014-11-27 DIAGNOSIS — H532 Diplopia: Secondary | ICD-10-CM | POA: Diagnosis not present

## 2014-12-10 ENCOUNTER — Ambulatory Visit (INDEPENDENT_AMBULATORY_CARE_PROVIDER_SITE_OTHER)
Admission: RE | Admit: 2014-12-10 | Discharge: 2014-12-10 | Disposition: A | Discharge status: Home / Self Care | Payer: Medicare Other | Source: Ambulatory Visit | Attending: Internal Medicine | Admitting: Internal Medicine

## 2014-12-10 DIAGNOSIS — I2510 Atherosclerotic heart disease of native coronary artery without angina pectoris: Secondary | ICD-10-CM | POA: Diagnosis not present

## 2014-12-10 DIAGNOSIS — R911 Solitary pulmonary nodule: Secondary | ICD-10-CM | POA: Diagnosis not present

## 2014-12-10 NOTE — Progress Notes (Signed)
Quick Note:  Spoke with pt and notified of results per Dr. Wert. Pt verbalized understanding and denied any questions.  ______ 

## 2014-12-11 DIAGNOSIS — I119 Hypertensive heart disease without heart failure: Secondary | ICD-10-CM | POA: Diagnosis not present

## 2014-12-11 DIAGNOSIS — R918 Other nonspecific abnormal finding of lung field: Secondary | ICD-10-CM | POA: Diagnosis not present

## 2014-12-11 DIAGNOSIS — R42 Dizziness and giddiness: Secondary | ICD-10-CM | POA: Diagnosis not present

## 2014-12-17 ENCOUNTER — Telehealth: Payer: Self-pay | Admitting: Internal Medicine

## 2014-12-17 DIAGNOSIS — R911 Solitary pulmonary nodule: Secondary | ICD-10-CM

## 2014-12-17 NOTE — Telephone Encounter (Signed)
Spoke with pt and relayed MW's recs including smoking cessation, she is wanting to proceed with this.   PET order placed.  Nothing further needed.   Forwarding this to MW only as Juluis Rainier to let him know that pt is opting to proceed with PET.

## 2014-12-17 NOTE — Telephone Encounter (Signed)
Spoke with the pt  She just had ct chest done to f/u pulmonary nodule on 12/10/14 and was advised to f/u again in 1 yr  She understands that most recent ct is stable, but wants to know if any further testing can be done sooner to ensure there is no CA  She is concerned b/c CA runs in her family (breast and pancreatic) Please advise thanks

## 2014-12-17 NOTE — Telephone Encounter (Signed)
We can do a PET scan - best option if we do the pet scan is stop smoking x 2 weeks before the study to help improve accuracy.   Note since this was not radiology's rec it is subject to review and denial but it's fine with me

## 2014-12-18 DIAGNOSIS — Z7982 Long term (current) use of aspirin: Secondary | ICD-10-CM | POA: Diagnosis not present

## 2014-12-18 DIAGNOSIS — E785 Hyperlipidemia, unspecified: Secondary | ICD-10-CM | POA: Diagnosis not present

## 2014-12-18 DIAGNOSIS — G629 Polyneuropathy, unspecified: Secondary | ICD-10-CM | POA: Diagnosis not present

## 2014-12-18 DIAGNOSIS — Z7902 Long term (current) use of antithrombotics/antiplatelets: Secondary | ICD-10-CM | POA: Diagnosis not present

## 2014-12-18 DIAGNOSIS — H2511 Age-related nuclear cataract, right eye: Secondary | ICD-10-CM | POA: Diagnosis not present

## 2014-12-18 DIAGNOSIS — J449 Chronic obstructive pulmonary disease, unspecified: Secondary | ICD-10-CM | POA: Diagnosis not present

## 2014-12-18 DIAGNOSIS — K219 Gastro-esophageal reflux disease without esophagitis: Secondary | ICD-10-CM | POA: Diagnosis not present

## 2014-12-18 DIAGNOSIS — H259 Unspecified age-related cataract: Secondary | ICD-10-CM | POA: Diagnosis not present

## 2014-12-18 DIAGNOSIS — Z79899 Other long term (current) drug therapy: Secondary | ICD-10-CM | POA: Diagnosis not present

## 2014-12-18 DIAGNOSIS — I10 Essential (primary) hypertension: Secondary | ICD-10-CM | POA: Diagnosis not present

## 2014-12-18 DIAGNOSIS — I739 Peripheral vascular disease, unspecified: Secondary | ICD-10-CM | POA: Diagnosis not present

## 2014-12-18 DIAGNOSIS — I2510 Atherosclerotic heart disease of native coronary artery without angina pectoris: Secondary | ICD-10-CM | POA: Diagnosis not present

## 2014-12-19 ENCOUNTER — Telehealth: Payer: Self-pay | Admitting: Internal Medicine

## 2014-12-19 NOTE — Telephone Encounter (Signed)
Xanax 0.5 mg rx #2 take 1-2 30-60 min prior to PET

## 2014-12-19 NOTE — Telephone Encounter (Signed)
Spoke with pt, states she is scheduled for a PET scan on Tuesday.  Pt states she is claustrophobic- is requesting something to help with this for her PET scan.   MW please advise if you are ok with giving something for PET.  Thanks!

## 2014-12-20 MED ORDER — ALPRAZOLAM 0.5 MG PO TABS: ORAL_TABLET | ORAL | Status: DC

## 2014-12-20 NOTE — Telephone Encounter (Signed)
Call and spoke to pt. Informed her of the recs per MW. Rx called into preferred pharmacy. Pt verbalized understanding and denied any further questions or concerns at this time.

## 2014-12-26 ENCOUNTER — Ambulatory Visit (HOSPITAL_COMMUNITY)
Admission: RE | Admit: 2014-12-26 | Discharge: 2014-12-26 | Disposition: A | Discharge status: Home / Self Care | Payer: Medicare Other | Source: Ambulatory Visit | Attending: Internal Medicine | Admitting: Internal Medicine

## 2014-12-26 ENCOUNTER — Telehealth: Payer: Self-pay | Admitting: Internal Medicine

## 2014-12-26 DIAGNOSIS — R911 Solitary pulmonary nodule: Secondary | ICD-10-CM | POA: Diagnosis not present

## 2014-12-26 LAB — GLUCOSE, CAPILLARY: Glucose-Capillary: 143 mg/dL — ABNORMAL HIGH (ref 65–99)

## 2014-12-26 MED ORDER — FLUDEOXYGLUCOSE F - 18 (FDG) INJECTION
7.6200 | Freq: Once | INTRAVENOUS | Status: AC | PRN
  Administered 2014-12-26: 7.62 via INTRAVENOUS

## 2014-12-26 NOTE — Telephone Encounter (Signed)
Notes Recorded by Rosana Berger, CMA on 12/26/2014 at 1:41 PM [facility] ------  Notes Recorded by Tanda Rockers, MD on 12/26/2014 at 1:20 PM Call patient : Study is unremarkable, no evidence of a tumor but radiology does rec the f/u CT in one year so we will be calling then to arrange it ------- Pt is aware of results. Nothing further was needed.

## 2014-12-26 NOTE — Progress Notes (Signed)
Quick Note:  LMTCB ______ 

## 2015-01-24 DIAGNOSIS — H524 Presbyopia: Secondary | ICD-10-CM | POA: Diagnosis not present

## 2015-02-06 DIAGNOSIS — N390 Urinary tract infection, site not specified: Secondary | ICD-10-CM | POA: Diagnosis not present

## 2015-02-06 DIAGNOSIS — I119 Hypertensive heart disease without heart failure: Secondary | ICD-10-CM | POA: Diagnosis not present

## 2015-02-06 DIAGNOSIS — N182 Chronic kidney disease, stage 2 (mild): Secondary | ICD-10-CM | POA: Diagnosis not present

## 2015-02-06 DIAGNOSIS — E1121 Type 2 diabetes mellitus with diabetic nephropathy: Secondary | ICD-10-CM | POA: Diagnosis not present

## 2015-02-06 DIAGNOSIS — I10 Essential (primary) hypertension: Secondary | ICD-10-CM | POA: Diagnosis not present

## 2015-02-06 DIAGNOSIS — E119 Type 2 diabetes mellitus without complications: Secondary | ICD-10-CM | POA: Diagnosis not present

## 2015-02-06 DIAGNOSIS — E780 Pure hypercholesterolemia: Secondary | ICD-10-CM | POA: Diagnosis not present

## 2015-02-06 DIAGNOSIS — I2510 Atherosclerotic heart disease of native coronary artery without angina pectoris: Secondary | ICD-10-CM | POA: Diagnosis not present

## 2015-02-06 DIAGNOSIS — N3000 Acute cystitis without hematuria: Secondary | ICD-10-CM | POA: Diagnosis not present

## 2015-02-06 DIAGNOSIS — E1140 Type 2 diabetes mellitus with diabetic neuropathy, unspecified: Secondary | ICD-10-CM | POA: Diagnosis not present

## 2015-02-06 DIAGNOSIS — E782 Mixed hyperlipidemia: Secondary | ICD-10-CM | POA: Diagnosis not present

## 2015-02-06 DIAGNOSIS — G5762 Lesion of plantar nerve, left lower limb: Secondary | ICD-10-CM | POA: Diagnosis not present

## 2015-04-29 DIAGNOSIS — R0781 Pleurodynia: Secondary | ICD-10-CM | POA: Diagnosis not present

## 2015-04-29 DIAGNOSIS — R0789 Other chest pain: Secondary | ICD-10-CM | POA: Diagnosis not present

## 2015-04-29 DIAGNOSIS — W1849XA Other slipping, tripping and stumbling without falling, initial encounter: Secondary | ICD-10-CM | POA: Diagnosis not present

## 2015-04-29 DIAGNOSIS — Z23 Encounter for immunization: Secondary | ICD-10-CM | POA: Diagnosis not present

## 2015-04-29 DIAGNOSIS — J3089 Other allergic rhinitis: Secondary | ICD-10-CM | POA: Diagnosis not present

## 2015-04-29 DIAGNOSIS — R21 Rash and other nonspecific skin eruption: Secondary | ICD-10-CM | POA: Diagnosis not present

## 2015-06-13 ENCOUNTER — Ambulatory Visit: Payer: Medicare Other | Admitting: Allergy and Immunology

## 2015-07-01 DIAGNOSIS — F320 Major depressive disorder, single episode, mild: Secondary | ICD-10-CM | POA: Diagnosis not present

## 2015-07-01 DIAGNOSIS — E559 Vitamin D deficiency, unspecified: Secondary | ICD-10-CM | POA: Diagnosis not present

## 2015-07-01 DIAGNOSIS — J3089 Other allergic rhinitis: Secondary | ICD-10-CM | POA: Diagnosis not present

## 2015-07-01 DIAGNOSIS — Z6827 Body mass index (BMI) 27.0-27.9, adult: Secondary | ICD-10-CM | POA: Diagnosis not present

## 2015-07-01 DIAGNOSIS — I2510 Atherosclerotic heart disease of native coronary artery without angina pectoris: Secondary | ICD-10-CM | POA: Diagnosis not present

## 2015-07-01 DIAGNOSIS — E1121 Type 2 diabetes mellitus with diabetic nephropathy: Secondary | ICD-10-CM | POA: Diagnosis not present

## 2015-07-01 DIAGNOSIS — I119 Hypertensive heart disease without heart failure: Secondary | ICD-10-CM | POA: Diagnosis not present

## 2015-07-01 DIAGNOSIS — E782 Mixed hyperlipidemia: Secondary | ICD-10-CM | POA: Diagnosis not present

## 2015-07-01 DIAGNOSIS — E663 Overweight: Secondary | ICD-10-CM | POA: Diagnosis not present

## 2015-10-02 DIAGNOSIS — E1121 Type 2 diabetes mellitus with diabetic nephropathy: Secondary | ICD-10-CM | POA: Diagnosis not present

## 2015-10-02 DIAGNOSIS — I2510 Atherosclerotic heart disease of native coronary artery without angina pectoris: Secondary | ICD-10-CM | POA: Diagnosis not present

## 2015-10-02 DIAGNOSIS — I119 Hypertensive heart disease without heart failure: Secondary | ICD-10-CM | POA: Diagnosis not present

## 2015-10-02 DIAGNOSIS — E1100 Type 2 diabetes mellitus with hyperosmolarity without nonketotic hyperglycemic-hyperosmolar coma (NKHHC): Secondary | ICD-10-CM | POA: Diagnosis not present

## 2015-10-02 DIAGNOSIS — E559 Vitamin D deficiency, unspecified: Secondary | ICD-10-CM | POA: Diagnosis not present

## 2015-10-02 DIAGNOSIS — R300 Dysuria: Secondary | ICD-10-CM | POA: Diagnosis not present

## 2015-10-02 DIAGNOSIS — N182 Chronic kidney disease, stage 2 (mild): Secondary | ICD-10-CM | POA: Diagnosis not present

## 2015-10-02 DIAGNOSIS — E782 Mixed hyperlipidemia: Secondary | ICD-10-CM | POA: Diagnosis not present

## 2015-10-02 DIAGNOSIS — E1142 Type 2 diabetes mellitus with diabetic polyneuropathy: Secondary | ICD-10-CM | POA: Diagnosis not present

## 2015-10-02 DIAGNOSIS — F321 Major depressive disorder, single episode, moderate: Secondary | ICD-10-CM | POA: Diagnosis not present

## 2015-12-02 ENCOUNTER — Telehealth: Payer: Self-pay | Admitting: Pharmacist

## 2015-12-02 ENCOUNTER — Other Ambulatory Visit: Payer: Self-pay | Admitting: Internal Medicine

## 2015-12-02 DIAGNOSIS — R911 Solitary pulmonary nodule: Secondary | ICD-10-CM

## 2015-12-02 DIAGNOSIS — E785 Hyperlipidemia, unspecified: Secondary | ICD-10-CM

## 2015-12-02 MED ORDER — ROSUVASTATIN CALCIUM 40 MG PO TABS: 40.0000 mg | ORAL_TABLET | Freq: Every day | ORAL | Status: DC

## 2015-12-02 NOTE — Telephone Encounter (Signed)
Patient ID:                  DOB:                          MRN:     HPI: Lindsay Brooks is a 65 y.o. female patient referred to lipid clinic by Dr Burt Knack and her PCP Dr Tobie Poet at [facility].  Her PMH includes CAD, Tobacco abuse, HLD, HTN, TIA, and DM2.   Patient states that she is adherent to her current regimen. Patient states PCP will draw fasting lipid panel at the end of May.  Current Medications: rosuvastatin 20 mg daily, Tricor (fenofibrate) 145 mg daily  Previously tried: simvastatin/ezetimibe 10/80 mg daily Intolerances: n/a Risk Factors:  LDL goal: <70 mg/dL   Labs: From PCP  02/07/15: TC 257, TG 257, HDl 35, LDl 171 (on Crestor 20 mg daily, Fenofibrate 145 mg daily) 10/03/15: TC 235, TG 165, HDL 40, LDl 162 (on Crestor 20 mg, Fenofibrate 145 mg daily)  Past Medical History  Diagnosis Date  . Peripheral vascular disease, unspecified     a. s/p L common femoral & left illiac stenting previously (patent by angio in 2007). - ABI 06/2011 normal. b. Carotid US 0-39% BICA 06/2011 (for f/u 2 yrs);   c.  ABIs 9/13:  normal (1.1 bilaterally)  . Type II or unspecified type diabetes mellitus without mention of complication, not stated as uncontrolled   . GERD (gastroesophageal reflux disease)   . Tobacco use disorder   . Personal history of peptic ulcer disease     Remotely.  . Other isolated or specific phobias   . Mini stroke     History of, in 2005 (before she was diagnosed with CAD).  . Other and unspecified hyperlipidemia   . Unspecified essential hypertension   . CAD (coronary artery disease)     a. s/p 2 CoStar DES to RCA 06/2004. b.  LHC 12/09:  EF 65%, oCFX 40%, oRCA 30%, then 30-40% leading into stented seg that is patent, (10-20% ISR), 40% beyond stent,;  c. EF 81%, poor exercise capacity, hypotensive BP response, + chest pain, normal nuclear images (overall low risk)  . Colon polyps     a. s/p removal 01/2012 - was told one might be cancerous but was not diagnosed  with colon cancer - plan is for f/u colonoscopy in 5 yrs.  Marland Kitchen Hx of echocardiogram     a. Echo 06/2011: EF 55-60%, Gr 1 diast dysfn, mild MR    Current Outpatient Prescriptions on File Prior to Visit  Medication Sig Dispense Refill  . ALPRAZolam (XANAX) 0.5 MG tablet Take 1-2 tabs, 30-60 min prior to PET scan. 2 tablet 0  . aspirin EC 81 MG tablet Take 81 mg by mouth daily.    Marland Kitchen atenolol (TENORMIN) 50 MG tablet Take 75 mg by mouth 2 (two) times daily.     . clopidogrel (PLAVIX) 75 MG tablet Take 75 mg by mouth daily at 12 noon.     . DULoxetine (CYMBALTA) 60 MG capsule Take 120 mg by mouth at bedtime.     Marland Kitchen esomeprazole (NEXIUM) 40 MG capsule Take 40 mg by mouth 2 (two) times daily.     . fenofibrate (TRICOR) 145 MG tablet Take 145 mg by mouth daily.    . fexofenadine (ALLEGRA) 180 MG tablet Take 180 mg by mouth daily.    . fluticasone (FLONASE) 50 MCG/ACT nasal  spray Place 2 sprays into the nose daily.    Marland Kitchen gabapentin (NEURONTIN) 600 MG tablet Take 600 mg by mouth 3 (three) times daily.    . hydrochlorothiazide 25 MG tablet Take 25 mg by mouth daily.      Marland Kitchen LORazepam (ATIVAN) 0.5 MG tablet Take 0.5 mg by mouth daily as needed for anxiety.     . nitroGLYCERIN (NITROSTAT) 0.4 MG SL tablet Place 0.4 mg under the tongue every 5 (five) minutes as needed. For chest pain    . potassium chloride (MICRO-K) 10 MEQ CR capsule Take 10 mEq by mouth daily at 12 noon.     . QUEtiapine (SEROQUEL) 50 MG tablet Take 50 mg by mouth at bedtime.      . rosuvastatin (CRESTOR) 20 MG tablet Take 20 mg by mouth at bedtime.    . sitaGLIPtin-metformin (JANUMET) 50-1000 MG per tablet Take 1 tablet by mouth daily.     . valsartan (DIOVAN) 320 MG tablet Take 320 mg by mouth daily at 12 noon.     . Vitamin D, Ergocalciferol, (DRISDOL) 50000 UNITS CAPS capsule Take 50,000 Units by mouth every 7 (seven) days. Takes on Wednesdays    . zolpidem (AMBIEN) 10 MG tablet Take 10 mg by mouth at bedtime as needed for sleep.       No current facility-administered medications on file prior to visit.    Allergies  Allergen Reactions  . Celecoxib Hives  . Insulins Nausea And Vomiting    Patient unsure of name of insulin, will call us back  . Vioxx [Rofecoxib] Hives    Assessment/Plan:  1. Hyperlipidemia: Currently above LDL goal of <70 mg/dL on rosuvastatin 20 mg daily and fenofibrate 145 mg daily.  Patient states adherence to her current regimen.  Will increase rosuvastatin to 40 mg daily.  Per patient her PCP plans to draw a fasting lipid panel in 1 month.  Will f/u fasting lipids from PCP.  Patient demonstrated understanding to her medication changes.

## 2015-12-26 ENCOUNTER — Inpatient Hospital Stay: Admission: RE | Admit: 2015-12-26 | Payer: Medicare Other | Source: Ambulatory Visit

## 2015-12-26 ENCOUNTER — Telehealth: Payer: Self-pay | Admitting: Internal Medicine

## 2015-12-26 NOTE — Telephone Encounter (Signed)
OK, will forward to MW as Juluis Rainier

## 2015-12-26 NOTE — Telephone Encounter (Signed)
Send copy of this note to primary care - this scan was the final one but is really optional and we can see her back at [facility] request prn

## 2015-12-27 NOTE — Telephone Encounter (Signed)
Message routed to PCP as requested. Nothing further needed.

## 2016-01-07 DIAGNOSIS — H01002 Unspecified blepharitis right lower eyelid: Secondary | ICD-10-CM | POA: Diagnosis not present

## 2016-01-16 DIAGNOSIS — Z1231 Encounter for screening mammogram for malignant neoplasm of breast: Secondary | ICD-10-CM | POA: Diagnosis not present

## 2016-01-16 DIAGNOSIS — M810 Age-related osteoporosis without current pathological fracture: Secondary | ICD-10-CM | POA: Diagnosis not present

## 2016-01-16 DIAGNOSIS — D4861 Neoplasm of uncertain behavior of right breast: Secondary | ICD-10-CM | POA: Diagnosis not present

## 2016-01-16 DIAGNOSIS — J301 Allergic rhinitis due to pollen: Secondary | ICD-10-CM | POA: Diagnosis not present

## 2016-01-16 DIAGNOSIS — S80862A Insect bite (nonvenomous), left lower leg, initial encounter: Secondary | ICD-10-CM | POA: Diagnosis not present

## 2016-01-16 DIAGNOSIS — L03116 Cellulitis of left lower limb: Secondary | ICD-10-CM | POA: Diagnosis not present

## 2016-02-03 DIAGNOSIS — Z780 Asymptomatic menopausal state: Secondary | ICD-10-CM | POA: Diagnosis not present

## 2016-02-03 DIAGNOSIS — N63 Unspecified lump in breast: Secondary | ICD-10-CM | POA: Diagnosis not present

## 2016-02-03 DIAGNOSIS — M810 Age-related osteoporosis without current pathological fracture: Secondary | ICD-10-CM | POA: Diagnosis not present

## 2016-02-04 DIAGNOSIS — E782 Mixed hyperlipidemia: Secondary | ICD-10-CM | POA: Diagnosis not present

## 2016-02-04 DIAGNOSIS — E1142 Type 2 diabetes mellitus with diabetic polyneuropathy: Secondary | ICD-10-CM | POA: Diagnosis not present

## 2016-02-04 DIAGNOSIS — E559 Vitamin D deficiency, unspecified: Secondary | ICD-10-CM | POA: Diagnosis not present

## 2016-02-04 DIAGNOSIS — I119 Hypertensive heart disease without heart failure: Secondary | ICD-10-CM | POA: Diagnosis not present

## 2016-02-04 DIAGNOSIS — F321 Major depressive disorder, single episode, moderate: Secondary | ICD-10-CM | POA: Diagnosis not present

## 2016-02-05 DIAGNOSIS — E559 Vitamin D deficiency, unspecified: Secondary | ICD-10-CM | POA: Diagnosis not present

## 2016-02-05 DIAGNOSIS — E782 Mixed hyperlipidemia: Secondary | ICD-10-CM | POA: Diagnosis not present

## 2016-02-05 DIAGNOSIS — E1121 Type 2 diabetes mellitus with diabetic nephropathy: Secondary | ICD-10-CM | POA: Diagnosis not present

## 2016-02-05 DIAGNOSIS — I2510 Atherosclerotic heart disease of native coronary artery without angina pectoris: Secondary | ICD-10-CM | POA: Diagnosis not present

## 2016-02-05 DIAGNOSIS — F321 Major depressive disorder, single episode, moderate: Secondary | ICD-10-CM | POA: Diagnosis not present

## 2016-02-05 DIAGNOSIS — N182 Chronic kidney disease, stage 2 (mild): Secondary | ICD-10-CM | POA: Diagnosis not present

## 2016-02-05 DIAGNOSIS — E1142 Type 2 diabetes mellitus with diabetic polyneuropathy: Secondary | ICD-10-CM | POA: Diagnosis not present

## 2016-02-05 DIAGNOSIS — I119 Hypertensive heart disease without heart failure: Secondary | ICD-10-CM | POA: Diagnosis not present

## 2016-02-27 DIAGNOSIS — R1013 Epigastric pain: Secondary | ICD-10-CM | POA: Diagnosis not present

## 2016-02-27 DIAGNOSIS — R1033 Periumbilical pain: Secondary | ICD-10-CM | POA: Diagnosis not present

## 2016-02-27 DIAGNOSIS — R10826 Epigastric rebound abdominal tenderness: Secondary | ICD-10-CM | POA: Diagnosis not present

## 2016-02-27 DIAGNOSIS — R10816 Epigastric abdominal tenderness: Secondary | ICD-10-CM | POA: Diagnosis not present

## 2016-02-27 DIAGNOSIS — Z1211 Encounter for screening for malignant neoplasm of colon: Secondary | ICD-10-CM | POA: Diagnosis not present

## 2016-02-27 DIAGNOSIS — R109 Unspecified abdominal pain: Secondary | ICD-10-CM | POA: Diagnosis not present

## 2016-02-27 DIAGNOSIS — Z0001 Encounter for general adult medical examination with abnormal findings: Secondary | ICD-10-CM | POA: Diagnosis not present

## 2016-02-27 DIAGNOSIS — Z6826 Body mass index (BMI) 26.0-26.9, adult: Secondary | ICD-10-CM | POA: Diagnosis not present

## 2016-03-30 DIAGNOSIS — S79912A Unspecified injury of left hip, initial encounter: Secondary | ICD-10-CM | POA: Diagnosis not present

## 2016-03-30 DIAGNOSIS — M25552 Pain in left hip: Secondary | ICD-10-CM | POA: Diagnosis not present

## 2016-03-30 DIAGNOSIS — M545 Low back pain: Secondary | ICD-10-CM | POA: Diagnosis not present

## 2016-03-30 DIAGNOSIS — M533 Sacrococcygeal disorders, not elsewhere classified: Secondary | ICD-10-CM | POA: Diagnosis not present

## 2016-03-30 DIAGNOSIS — S76012A Strain of muscle, fascia and tendon of left hip, initial encounter: Secondary | ICD-10-CM | POA: Diagnosis not present

## 2016-08-12 DIAGNOSIS — J018 Other acute sinusitis: Secondary | ICD-10-CM | POA: Diagnosis not present

## 2016-08-12 DIAGNOSIS — Z23 Encounter for immunization: Secondary | ICD-10-CM | POA: Diagnosis not present

## 2016-08-12 DIAGNOSIS — J301 Allergic rhinitis due to pollen: Secondary | ICD-10-CM | POA: Diagnosis not present

## 2016-08-12 DIAGNOSIS — I119 Hypertensive heart disease without heart failure: Secondary | ICD-10-CM | POA: Diagnosis not present

## 2016-08-31 DIAGNOSIS — I119 Hypertensive heart disease without heart failure: Secondary | ICD-10-CM | POA: Diagnosis not present

## 2016-08-31 DIAGNOSIS — E782 Mixed hyperlipidemia: Secondary | ICD-10-CM | POA: Diagnosis not present

## 2016-08-31 DIAGNOSIS — E1142 Type 2 diabetes mellitus with diabetic polyneuropathy: Secondary | ICD-10-CM | POA: Diagnosis not present

## 2016-08-31 DIAGNOSIS — I25118 Atherosclerotic heart disease of native coronary artery with other forms of angina pectoris: Secondary | ICD-10-CM | POA: Diagnosis not present

## 2016-10-07 DIAGNOSIS — E1142 Type 2 diabetes mellitus with diabetic polyneuropathy: Secondary | ICD-10-CM | POA: Diagnosis not present

## 2016-10-07 DIAGNOSIS — M25561 Pain in right knee: Secondary | ICD-10-CM | POA: Diagnosis not present

## 2016-10-07 DIAGNOSIS — E034 Atrophy of thyroid (acquired): Secondary | ICD-10-CM | POA: Diagnosis not present

## 2016-10-07 DIAGNOSIS — Z23 Encounter for immunization: Secondary | ICD-10-CM | POA: Diagnosis not present

## 2016-10-07 DIAGNOSIS — F5105 Insomnia due to other mental disorder: Secondary | ICD-10-CM | POA: Diagnosis not present

## 2016-10-07 DIAGNOSIS — F17210 Nicotine dependence, cigarettes, uncomplicated: Secondary | ICD-10-CM | POA: Diagnosis not present

## 2016-10-08 DIAGNOSIS — M25562 Pain in left knee: Secondary | ICD-10-CM | POA: Diagnosis not present

## 2016-10-08 DIAGNOSIS — M25561 Pain in right knee: Secondary | ICD-10-CM | POA: Diagnosis not present

## 2016-10-30 DIAGNOSIS — M25561 Pain in right knee: Secondary | ICD-10-CM | POA: Diagnosis not present

## 2016-10-30 DIAGNOSIS — M1711 Unilateral primary osteoarthritis, right knee: Secondary | ICD-10-CM | POA: Diagnosis not present

## 2016-11-08 DIAGNOSIS — Z8719 Personal history of other diseases of the digestive system: Secondary | ICD-10-CM | POA: Diagnosis not present

## 2016-11-08 DIAGNOSIS — R1110 Vomiting, unspecified: Secondary | ICD-10-CM | POA: Diagnosis not present

## 2016-11-08 DIAGNOSIS — R030 Elevated blood-pressure reading, without diagnosis of hypertension: Secondary | ICD-10-CM | POA: Diagnosis not present

## 2016-11-08 DIAGNOSIS — K920 Hematemesis: Secondary | ICD-10-CM | POA: Diagnosis not present

## 2016-11-09 DIAGNOSIS — R1310 Dysphagia, unspecified: Secondary | ICD-10-CM | POA: Diagnosis not present

## 2016-11-09 DIAGNOSIS — K920 Hematemesis: Secondary | ICD-10-CM | POA: Diagnosis not present

## 2016-11-09 DIAGNOSIS — K219 Gastro-esophageal reflux disease without esophagitis: Secondary | ICD-10-CM | POA: Diagnosis not present

## 2016-11-10 DIAGNOSIS — I10 Essential (primary) hypertension: Secondary | ICD-10-CM | POA: Diagnosis not present

## 2016-11-10 DIAGNOSIS — K219 Gastro-esophageal reflux disease without esophagitis: Secondary | ICD-10-CM | POA: Diagnosis not present

## 2016-11-10 DIAGNOSIS — K449 Diaphragmatic hernia without obstruction or gangrene: Secondary | ICD-10-CM | POA: Diagnosis not present

## 2016-11-10 DIAGNOSIS — K920 Hematemesis: Secondary | ICD-10-CM | POA: Diagnosis not present

## 2016-11-10 DIAGNOSIS — F17200 Nicotine dependence, unspecified, uncomplicated: Secondary | ICD-10-CM | POA: Diagnosis not present

## 2016-11-10 DIAGNOSIS — Z7982 Long term (current) use of aspirin: Secondary | ICD-10-CM | POA: Diagnosis not present

## 2016-11-10 DIAGNOSIS — K2970 Gastritis, unspecified, without bleeding: Secondary | ICD-10-CM | POA: Diagnosis not present

## 2016-11-10 DIAGNOSIS — E119 Type 2 diabetes mellitus without complications: Secondary | ICD-10-CM | POA: Diagnosis not present

## 2016-11-10 DIAGNOSIS — I519 Heart disease, unspecified: Secondary | ICD-10-CM | POA: Diagnosis not present

## 2016-11-10 DIAGNOSIS — K208 Other esophagitis: Secondary | ICD-10-CM | POA: Diagnosis not present

## 2016-11-10 DIAGNOSIS — Z800 Family history of malignant neoplasm of digestive organs: Secondary | ICD-10-CM | POA: Diagnosis not present

## 2016-11-10 DIAGNOSIS — Z79899 Other long term (current) drug therapy: Secondary | ICD-10-CM | POA: Diagnosis not present

## 2016-11-10 DIAGNOSIS — R1310 Dysphagia, unspecified: Secondary | ICD-10-CM | POA: Diagnosis not present

## 2016-11-10 DIAGNOSIS — K2960 Other gastritis without bleeding: Secondary | ICD-10-CM | POA: Diagnosis not present

## 2016-12-10 ENCOUNTER — Other Ambulatory Visit: Payer: Self-pay | Admitting: Cardiovascular Disease

## 2016-12-10 DIAGNOSIS — E785 Hyperlipidemia, unspecified: Secondary | ICD-10-CM

## 2016-12-30 DIAGNOSIS — E039 Hypothyroidism, unspecified: Secondary | ICD-10-CM | POA: Diagnosis not present

## 2016-12-30 DIAGNOSIS — E782 Mixed hyperlipidemia: Secondary | ICD-10-CM | POA: Diagnosis not present

## 2016-12-30 DIAGNOSIS — E790 Hyperuricemia without signs of inflammatory arthritis and tophaceous disease: Secondary | ICD-10-CM | POA: Diagnosis not present

## 2016-12-30 DIAGNOSIS — E559 Vitamin D deficiency, unspecified: Secondary | ICD-10-CM | POA: Diagnosis not present

## 2016-12-30 DIAGNOSIS — I10 Essential (primary) hypertension: Secondary | ICD-10-CM | POA: Diagnosis not present

## 2016-12-30 DIAGNOSIS — Z5181 Encounter for therapeutic drug level monitoring: Secondary | ICD-10-CM | POA: Diagnosis not present

## 2016-12-30 DIAGNOSIS — E119 Type 2 diabetes mellitus without complications: Secondary | ICD-10-CM | POA: Diagnosis not present

## 2016-12-30 DIAGNOSIS — E1165 Type 2 diabetes mellitus with hyperglycemia: Secondary | ICD-10-CM | POA: Diagnosis not present

## 2016-12-30 DIAGNOSIS — Z130 Encounter for screening for diseases of the blood and blood-forming organs and certain disorders involving the immune mechanism: Secondary | ICD-10-CM | POA: Diagnosis not present

## 2016-12-30 DIAGNOSIS — R0789 Other chest pain: Secondary | ICD-10-CM | POA: Diagnosis not present

## 2016-12-30 DIAGNOSIS — F411 Generalized anxiety disorder: Secondary | ICD-10-CM | POA: Diagnosis not present

## 2016-12-31 DIAGNOSIS — I4510 Unspecified right bundle-branch block: Secondary | ICD-10-CM | POA: Diagnosis not present

## 2016-12-31 DIAGNOSIS — R002 Palpitations: Secondary | ICD-10-CM | POA: Diagnosis not present

## 2017-01-28 DIAGNOSIS — Z5181 Encounter for therapeutic drug level monitoring: Secondary | ICD-10-CM | POA: Diagnosis not present

## 2017-01-28 DIAGNOSIS — F411 Generalized anxiety disorder: Secondary | ICD-10-CM | POA: Diagnosis not present

## 2017-01-28 DIAGNOSIS — E119 Type 2 diabetes mellitus without complications: Secondary | ICD-10-CM | POA: Diagnosis not present

## 2017-01-28 DIAGNOSIS — E559 Vitamin D deficiency, unspecified: Secondary | ICD-10-CM | POA: Diagnosis not present

## 2017-01-28 DIAGNOSIS — Z1389 Encounter for screening for other disorder: Secondary | ICD-10-CM | POA: Diagnosis not present

## 2017-01-29 DIAGNOSIS — F332 Major depressive disorder, recurrent severe without psychotic features: Secondary | ICD-10-CM | POA: Diagnosis not present

## 2017-01-29 DIAGNOSIS — E119 Type 2 diabetes mellitus without complications: Secondary | ICD-10-CM | POA: Diagnosis not present

## 2017-01-29 DIAGNOSIS — I10 Essential (primary) hypertension: Secondary | ICD-10-CM | POA: Diagnosis not present

## 2017-01-29 DIAGNOSIS — I240 Acute coronary thrombosis not resulting in myocardial infarction: Secondary | ICD-10-CM | POA: Diagnosis not present

## 2017-01-29 DIAGNOSIS — Z1379 Encounter for other screening for genetic and chromosomal anomalies: Secondary | ICD-10-CM | POA: Diagnosis not present

## 2017-01-29 DIAGNOSIS — F3130 Bipolar disorder, current episode depressed, mild or moderate severity, unspecified: Secondary | ICD-10-CM | POA: Diagnosis not present

## 2017-01-29 DIAGNOSIS — F3160 Bipolar disorder, current episode mixed, unspecified: Secondary | ICD-10-CM | POA: Diagnosis not present

## 2017-01-29 DIAGNOSIS — F339 Major depressive disorder, recurrent, unspecified: Secondary | ICD-10-CM | POA: Diagnosis not present

## 2017-01-29 DIAGNOSIS — I25118 Atherosclerotic heart disease of native coronary artery with other forms of angina pectoris: Secondary | ICD-10-CM | POA: Diagnosis not present

## 2017-01-29 DIAGNOSIS — M79609 Pain in unspecified limb: Secondary | ICD-10-CM | POA: Diagnosis not present

## 2017-01-29 DIAGNOSIS — I249 Acute ischemic heart disease, unspecified: Secondary | ICD-10-CM | POA: Diagnosis not present

## 2017-01-29 DIAGNOSIS — I248 Other forms of acute ischemic heart disease: Secondary | ICD-10-CM | POA: Diagnosis not present

## 2017-02-02 DIAGNOSIS — G40802 Other epilepsy, not intractable, without status epilepticus: Secondary | ICD-10-CM | POA: Diagnosis not present

## 2017-02-02 DIAGNOSIS — G40A09 Absence epileptic syndrome, not intractable, without status epilepticus: Secondary | ICD-10-CM | POA: Diagnosis not present

## 2017-02-02 DIAGNOSIS — G40909 Epilepsy, unspecified, not intractable, without status epilepticus: Secondary | ICD-10-CM | POA: Diagnosis not present

## 2017-02-02 DIAGNOSIS — R55 Syncope and collapse: Secondary | ICD-10-CM | POA: Diagnosis not present

## 2017-02-03 DIAGNOSIS — G40909 Epilepsy, unspecified, not intractable, without status epilepticus: Secondary | ICD-10-CM | POA: Diagnosis not present

## 2017-02-03 DIAGNOSIS — R55 Syncope and collapse: Secondary | ICD-10-CM | POA: Diagnosis not present

## 2017-02-03 DIAGNOSIS — G40A09 Absence epileptic syndrome, not intractable, without status epilepticus: Secondary | ICD-10-CM | POA: Diagnosis not present

## 2017-02-03 DIAGNOSIS — G40802 Other epilepsy, not intractable, without status epilepticus: Secondary | ICD-10-CM | POA: Diagnosis not present

## 2017-02-04 DIAGNOSIS — G40A09 Absence epileptic syndrome, not intractable, without status epilepticus: Secondary | ICD-10-CM | POA: Diagnosis not present

## 2017-02-04 DIAGNOSIS — G40909 Epilepsy, unspecified, not intractable, without status epilepticus: Secondary | ICD-10-CM | POA: Diagnosis not present

## 2017-02-04 DIAGNOSIS — R55 Syncope and collapse: Secondary | ICD-10-CM | POA: Diagnosis not present

## 2017-02-04 DIAGNOSIS — G40802 Other epilepsy, not intractable, without status epilepticus: Secondary | ICD-10-CM | POA: Diagnosis not present

## 2017-02-09 DIAGNOSIS — G8929 Other chronic pain: Secondary | ICD-10-CM | POA: Diagnosis not present

## 2017-02-09 DIAGNOSIS — I739 Peripheral vascular disease, unspecified: Secondary | ICD-10-CM | POA: Diagnosis not present

## 2017-02-09 DIAGNOSIS — R0602 Shortness of breath: Secondary | ICD-10-CM | POA: Diagnosis not present

## 2017-02-09 DIAGNOSIS — Z8673 Personal history of transient ischemic attack (TIA), and cerebral infarction without residual deficits: Secondary | ICD-10-CM | POA: Diagnosis not present

## 2017-02-09 DIAGNOSIS — I313 Pericardial effusion (noninflammatory): Secondary | ICD-10-CM | POA: Diagnosis not present

## 2017-02-09 DIAGNOSIS — Z8249 Family history of ischemic heart disease and other diseases of the circulatory system: Secondary | ICD-10-CM | POA: Diagnosis not present

## 2017-02-09 DIAGNOSIS — I361 Nonrheumatic tricuspid (valve) insufficiency: Secondary | ICD-10-CM | POA: Diagnosis not present

## 2017-02-09 DIAGNOSIS — E1142 Type 2 diabetes mellitus with diabetic polyneuropathy: Secondary | ICD-10-CM | POA: Diagnosis not present

## 2017-02-09 DIAGNOSIS — J449 Chronic obstructive pulmonary disease, unspecified: Secondary | ICD-10-CM | POA: Diagnosis not present

## 2017-02-09 DIAGNOSIS — R079 Chest pain, unspecified: Secondary | ICD-10-CM | POA: Diagnosis not present

## 2017-02-09 DIAGNOSIS — R000 Tachycardia, unspecified: Secondary | ICD-10-CM | POA: Diagnosis not present

## 2017-02-09 DIAGNOSIS — Z720 Tobacco use: Secondary | ICD-10-CM | POA: Diagnosis not present

## 2017-02-09 DIAGNOSIS — R0600 Dyspnea, unspecified: Secondary | ICD-10-CM | POA: Diagnosis not present

## 2017-02-09 DIAGNOSIS — E119 Type 2 diabetes mellitus without complications: Secondary | ICD-10-CM | POA: Diagnosis not present

## 2017-02-09 DIAGNOSIS — Z955 Presence of coronary angioplasty implant and graft: Secondary | ICD-10-CM | POA: Diagnosis not present

## 2017-02-09 DIAGNOSIS — Z95820 Peripheral vascular angioplasty status with implants and grafts: Secondary | ICD-10-CM | POA: Diagnosis not present

## 2017-02-09 DIAGNOSIS — F17210 Nicotine dependence, cigarettes, uncomplicated: Secondary | ICD-10-CM | POA: Diagnosis not present

## 2017-02-09 DIAGNOSIS — I25118 Atherosclerotic heart disease of native coronary artery with other forms of angina pectoris: Secondary | ICD-10-CM | POA: Diagnosis not present

## 2017-02-09 DIAGNOSIS — I119 Hypertensive heart disease without heart failure: Secondary | ICD-10-CM | POA: Diagnosis not present

## 2017-02-09 DIAGNOSIS — R0789 Other chest pain: Secondary | ICD-10-CM | POA: Diagnosis not present

## 2017-02-10 DIAGNOSIS — Z955 Presence of coronary angioplasty implant and graft: Secondary | ICD-10-CM | POA: Diagnosis not present

## 2017-02-10 DIAGNOSIS — I340 Nonrheumatic mitral (valve) insufficiency: Secondary | ICD-10-CM | POA: Diagnosis present

## 2017-02-10 DIAGNOSIS — I739 Peripheral vascular disease, unspecified: Secondary | ICD-10-CM | POA: Diagnosis not present

## 2017-02-10 DIAGNOSIS — E119 Type 2 diabetes mellitus without complications: Secondary | ICD-10-CM | POA: Diagnosis present

## 2017-02-10 DIAGNOSIS — F17210 Nicotine dependence, cigarettes, uncomplicated: Secondary | ICD-10-CM | POA: Diagnosis present

## 2017-02-10 DIAGNOSIS — G43909 Migraine, unspecified, not intractable, without status migrainosus: Secondary | ICD-10-CM | POA: Diagnosis present

## 2017-02-10 DIAGNOSIS — R079 Chest pain, unspecified: Secondary | ICD-10-CM | POA: Diagnosis not present

## 2017-02-10 DIAGNOSIS — Z8673 Personal history of transient ischemic attack (TIA), and cerebral infarction without residual deficits: Secondary | ICD-10-CM | POA: Diagnosis not present

## 2017-02-10 DIAGNOSIS — K210 Gastro-esophageal reflux disease with esophagitis: Secondary | ICD-10-CM | POA: Diagnosis present

## 2017-02-10 DIAGNOSIS — Z880 Allergy status to penicillin: Secondary | ICD-10-CM | POA: Diagnosis not present

## 2017-02-10 DIAGNOSIS — Z7984 Long term (current) use of oral hypoglycemic drugs: Secondary | ICD-10-CM | POA: Diagnosis not present

## 2017-02-10 DIAGNOSIS — I4510 Unspecified right bundle-branch block: Secondary | ICD-10-CM | POA: Diagnosis present

## 2017-02-10 DIAGNOSIS — I119 Hypertensive heart disease without heart failure: Secondary | ICD-10-CM | POA: Diagnosis not present

## 2017-02-10 DIAGNOSIS — F419 Anxiety disorder, unspecified: Secondary | ICD-10-CM | POA: Diagnosis present

## 2017-02-10 DIAGNOSIS — E785 Hyperlipidemia, unspecified: Secondary | ICD-10-CM | POA: Diagnosis present

## 2017-02-10 DIAGNOSIS — Z95820 Peripheral vascular angioplasty status with implants and grafts: Secondary | ICD-10-CM | POA: Diagnosis not present

## 2017-02-10 DIAGNOSIS — F329 Major depressive disorder, single episode, unspecified: Secondary | ICD-10-CM | POA: Diagnosis present

## 2017-02-10 DIAGNOSIS — I313 Pericardial effusion (noninflammatory): Secondary | ICD-10-CM | POA: Diagnosis present

## 2017-02-10 DIAGNOSIS — I10 Essential (primary) hypertension: Secondary | ICD-10-CM | POA: Diagnosis present

## 2017-02-10 DIAGNOSIS — Z7982 Long term (current) use of aspirin: Secondary | ICD-10-CM | POA: Diagnosis not present

## 2017-02-10 DIAGNOSIS — R000 Tachycardia, unspecified: Secondary | ICD-10-CM | POA: Diagnosis present

## 2017-02-10 DIAGNOSIS — R0789 Other chest pain: Secondary | ICD-10-CM | POA: Diagnosis not present

## 2017-02-10 DIAGNOSIS — E784 Other hyperlipidemia: Secondary | ICD-10-CM | POA: Diagnosis not present

## 2017-02-10 DIAGNOSIS — I2510 Atherosclerotic heart disease of native coronary artery without angina pectoris: Secondary | ICD-10-CM | POA: Diagnosis not present

## 2017-02-10 DIAGNOSIS — J449 Chronic obstructive pulmonary disease, unspecified: Secondary | ICD-10-CM | POA: Diagnosis present

## 2017-02-10 DIAGNOSIS — I25118 Atherosclerotic heart disease of native coronary artery with other forms of angina pectoris: Secondary | ICD-10-CM | POA: Diagnosis present

## 2017-02-10 DIAGNOSIS — I517 Cardiomegaly: Secondary | ICD-10-CM | POA: Diagnosis not present

## 2017-03-08 DIAGNOSIS — F17210 Nicotine dependence, cigarettes, uncomplicated: Secondary | ICD-10-CM | POA: Diagnosis not present

## 2017-03-08 DIAGNOSIS — E790 Hyperuricemia without signs of inflammatory arthritis and tophaceous disease: Secondary | ICD-10-CM | POA: Diagnosis not present

## 2017-03-08 DIAGNOSIS — E1165 Type 2 diabetes mellitus with hyperglycemia: Secondary | ICD-10-CM | POA: Diagnosis not present

## 2017-03-08 DIAGNOSIS — E662 Morbid (severe) obesity with alveolar hypoventilation: Secondary | ICD-10-CM | POA: Diagnosis not present

## 2017-03-08 DIAGNOSIS — I10 Essential (primary) hypertension: Secondary | ICD-10-CM | POA: Diagnosis not present

## 2017-03-08 DIAGNOSIS — R002 Palpitations: Secondary | ICD-10-CM | POA: Diagnosis not present

## 2017-03-08 DIAGNOSIS — E559 Vitamin D deficiency, unspecified: Secondary | ICD-10-CM | POA: Diagnosis not present

## 2017-03-08 DIAGNOSIS — E782 Mixed hyperlipidemia: Secondary | ICD-10-CM | POA: Diagnosis not present

## 2017-03-08 DIAGNOSIS — E039 Hypothyroidism, unspecified: Secondary | ICD-10-CM | POA: Diagnosis not present

## 2017-03-08 DIAGNOSIS — E119 Type 2 diabetes mellitus without complications: Secondary | ICD-10-CM | POA: Diagnosis not present

## 2017-03-08 DIAGNOSIS — I519 Heart disease, unspecified: Secondary | ICD-10-CM | POA: Diagnosis not present

## 2017-03-15 DIAGNOSIS — I519 Heart disease, unspecified: Secondary | ICD-10-CM | POA: Diagnosis not present

## 2017-03-15 DIAGNOSIS — E119 Type 2 diabetes mellitus without complications: Secondary | ICD-10-CM | POA: Diagnosis not present

## 2017-03-15 DIAGNOSIS — G8929 Other chronic pain: Secondary | ICD-10-CM | POA: Diagnosis not present

## 2017-03-15 DIAGNOSIS — Z9119 Patient's noncompliance with other medical treatment and regimen: Secondary | ICD-10-CM | POA: Diagnosis not present

## 2017-03-15 DIAGNOSIS — R944 Abnormal results of kidney function studies: Secondary | ICD-10-CM | POA: Diagnosis not present

## 2017-03-15 DIAGNOSIS — I10 Essential (primary) hypertension: Secondary | ICD-10-CM | POA: Diagnosis not present

## 2017-03-24 DIAGNOSIS — I10 Essential (primary) hypertension: Secondary | ICD-10-CM | POA: Diagnosis not present

## 2017-03-24 DIAGNOSIS — Z720 Tobacco use: Secondary | ICD-10-CM | POA: Diagnosis not present

## 2017-03-24 DIAGNOSIS — E785 Hyperlipidemia, unspecified: Secondary | ICD-10-CM | POA: Diagnosis not present

## 2017-03-24 DIAGNOSIS — R079 Chest pain, unspecified: Secondary | ICD-10-CM | POA: Diagnosis not present

## 2017-03-24 DIAGNOSIS — I739 Peripheral vascular disease, unspecified: Secondary | ICD-10-CM | POA: Diagnosis not present

## 2017-03-24 DIAGNOSIS — I2510 Atherosclerotic heart disease of native coronary artery without angina pectoris: Secondary | ICD-10-CM | POA: Diagnosis not present

## 2017-03-24 DIAGNOSIS — I4510 Unspecified right bundle-branch block: Secondary | ICD-10-CM | POA: Diagnosis not present

## 2017-03-24 DIAGNOSIS — R000 Tachycardia, unspecified: Secondary | ICD-10-CM | POA: Diagnosis not present

## 2017-03-24 DIAGNOSIS — I313 Pericardial effusion (noninflammatory): Secondary | ICD-10-CM | POA: Diagnosis not present

## 2017-03-24 DIAGNOSIS — G8929 Other chronic pain: Secondary | ICD-10-CM | POA: Diagnosis not present

## 2017-03-24 DIAGNOSIS — Z955 Presence of coronary angioplasty implant and graft: Secondary | ICD-10-CM | POA: Diagnosis not present

## 2017-04-15 DIAGNOSIS — I10 Essential (primary) hypertension: Secondary | ICD-10-CM | POA: Diagnosis not present

## 2017-04-15 DIAGNOSIS — E119 Type 2 diabetes mellitus without complications: Secondary | ICD-10-CM | POA: Diagnosis not present

## 2017-04-15 DIAGNOSIS — F17210 Nicotine dependence, cigarettes, uncomplicated: Secondary | ICD-10-CM | POA: Diagnosis not present

## 2017-04-15 DIAGNOSIS — F411 Generalized anxiety disorder: Secondary | ICD-10-CM | POA: Diagnosis not present

## 2017-04-15 DIAGNOSIS — Z5181 Encounter for therapeutic drug level monitoring: Secondary | ICD-10-CM | POA: Diagnosis not present

## 2017-04-15 DIAGNOSIS — I519 Heart disease, unspecified: Secondary | ICD-10-CM | POA: Diagnosis not present

## 2017-05-17 DIAGNOSIS — I10 Essential (primary) hypertension: Secondary | ICD-10-CM | POA: Diagnosis not present

## 2017-05-17 DIAGNOSIS — I519 Heart disease, unspecified: Secondary | ICD-10-CM | POA: Diagnosis not present

## 2017-05-17 DIAGNOSIS — F17210 Nicotine dependence, cigarettes, uncomplicated: Secondary | ICD-10-CM | POA: Diagnosis not present

## 2017-05-17 DIAGNOSIS — E119 Type 2 diabetes mellitus without complications: Secondary | ICD-10-CM | POA: Diagnosis not present

## 2017-05-17 DIAGNOSIS — R05 Cough: Secondary | ICD-10-CM | POA: Diagnosis not present

## 2017-05-17 DIAGNOSIS — M79601 Pain in right arm: Secondary | ICD-10-CM | POA: Diagnosis not present

## 2017-05-18 DIAGNOSIS — E782 Mixed hyperlipidemia: Secondary | ICD-10-CM | POA: Diagnosis not present

## 2017-05-18 DIAGNOSIS — E1165 Type 2 diabetes mellitus with hyperglycemia: Secondary | ICD-10-CM | POA: Diagnosis not present

## 2017-05-18 DIAGNOSIS — I10 Essential (primary) hypertension: Secondary | ICD-10-CM | POA: Diagnosis not present

## 2017-05-18 DIAGNOSIS — E559 Vitamin D deficiency, unspecified: Secondary | ICD-10-CM | POA: Diagnosis not present

## 2017-05-18 DIAGNOSIS — E790 Hyperuricemia without signs of inflammatory arthritis and tophaceous disease: Secondary | ICD-10-CM | POA: Diagnosis not present

## 2017-05-19 DIAGNOSIS — R079 Chest pain, unspecified: Secondary | ICD-10-CM | POA: Diagnosis not present

## 2017-05-19 DIAGNOSIS — Z7982 Long term (current) use of aspirin: Secondary | ICD-10-CM | POA: Diagnosis not present

## 2017-05-19 DIAGNOSIS — I10 Essential (primary) hypertension: Secondary | ICD-10-CM | POA: Diagnosis not present

## 2017-05-19 DIAGNOSIS — J439 Emphysema, unspecified: Secondary | ICD-10-CM | POA: Diagnosis not present

## 2017-05-19 DIAGNOSIS — Z7901 Long term (current) use of anticoagulants: Secondary | ICD-10-CM | POA: Diagnosis not present

## 2017-05-19 DIAGNOSIS — Z955 Presence of coronary angioplasty implant and graft: Secondary | ICD-10-CM | POA: Diagnosis not present

## 2017-05-19 DIAGNOSIS — Z720 Tobacco use: Secondary | ICD-10-CM | POA: Diagnosis not present

## 2017-05-19 DIAGNOSIS — E785 Hyperlipidemia, unspecified: Secondary | ICD-10-CM | POA: Diagnosis not present

## 2017-05-19 DIAGNOSIS — I2510 Atherosclerotic heart disease of native coronary artery without angina pectoris: Secondary | ICD-10-CM | POA: Diagnosis not present

## 2017-06-30 DIAGNOSIS — I209 Angina pectoris, unspecified: Secondary | ICD-10-CM | POA: Diagnosis not present

## 2017-06-30 DIAGNOSIS — E785 Hyperlipidemia, unspecified: Secondary | ICD-10-CM | POA: Diagnosis not present

## 2017-06-30 DIAGNOSIS — I10 Essential (primary) hypertension: Secondary | ICD-10-CM | POA: Diagnosis not present

## 2017-06-30 DIAGNOSIS — Z955 Presence of coronary angioplasty implant and graft: Secondary | ICD-10-CM | POA: Diagnosis not present

## 2017-06-30 DIAGNOSIS — I2510 Atherosclerotic heart disease of native coronary artery without angina pectoris: Secondary | ICD-10-CM | POA: Diagnosis not present

## 2017-07-07 DIAGNOSIS — I25118 Atherosclerotic heart disease of native coronary artery with other forms of angina pectoris: Secondary | ICD-10-CM | POA: Diagnosis not present

## 2017-07-07 DIAGNOSIS — Z955 Presence of coronary angioplasty implant and graft: Secondary | ICD-10-CM | POA: Diagnosis not present

## 2017-07-07 DIAGNOSIS — F17210 Nicotine dependence, cigarettes, uncomplicated: Secondary | ICD-10-CM | POA: Diagnosis not present

## 2017-07-07 DIAGNOSIS — I10 Essential (primary) hypertension: Secondary | ICD-10-CM | POA: Diagnosis not present

## 2017-07-07 DIAGNOSIS — J9811 Atelectasis: Secondary | ICD-10-CM | POA: Diagnosis not present

## 2017-07-07 DIAGNOSIS — Z79899 Other long term (current) drug therapy: Secondary | ICD-10-CM | POA: Diagnosis not present

## 2017-07-07 DIAGNOSIS — Z23 Encounter for immunization: Secondary | ICD-10-CM | POA: Diagnosis not present

## 2017-07-07 DIAGNOSIS — E785 Hyperlipidemia, unspecified: Secondary | ICD-10-CM | POA: Diagnosis not present

## 2017-07-07 DIAGNOSIS — I25119 Atherosclerotic heart disease of native coronary artery with unspecified angina pectoris: Secondary | ICD-10-CM | POA: Diagnosis not present

## 2017-07-08 DIAGNOSIS — I25119 Atherosclerotic heart disease of native coronary artery with unspecified angina pectoris: Secondary | ICD-10-CM | POA: Diagnosis not present

## 2017-07-08 DIAGNOSIS — E785 Hyperlipidemia, unspecified: Secondary | ICD-10-CM | POA: Diagnosis not present

## 2017-07-08 DIAGNOSIS — Z955 Presence of coronary angioplasty implant and graft: Secondary | ICD-10-CM | POA: Diagnosis not present

## 2017-07-08 DIAGNOSIS — I10 Essential (primary) hypertension: Secondary | ICD-10-CM | POA: Diagnosis not present

## 2017-07-08 DIAGNOSIS — F17210 Nicotine dependence, cigarettes, uncomplicated: Secondary | ICD-10-CM | POA: Diagnosis not present

## 2017-07-08 DIAGNOSIS — Z79899 Other long term (current) drug therapy: Secondary | ICD-10-CM | POA: Diagnosis not present

## 2017-07-08 DIAGNOSIS — I25118 Atherosclerotic heart disease of native coronary artery with other forms of angina pectoris: Secondary | ICD-10-CM | POA: Diagnosis not present

## 2017-08-11 DIAGNOSIS — E785 Hyperlipidemia, unspecified: Secondary | ICD-10-CM | POA: Diagnosis not present

## 2017-08-11 DIAGNOSIS — J439 Emphysema, unspecified: Secondary | ICD-10-CM | POA: Diagnosis not present

## 2017-08-11 DIAGNOSIS — I10 Essential (primary) hypertension: Secondary | ICD-10-CM | POA: Diagnosis not present

## 2017-08-11 DIAGNOSIS — I2510 Atherosclerotic heart disease of native coronary artery without angina pectoris: Secondary | ICD-10-CM | POA: Diagnosis not present

## 2017-08-24 DIAGNOSIS — E1165 Type 2 diabetes mellitus with hyperglycemia: Secondary | ICD-10-CM | POA: Diagnosis not present

## 2017-08-24 DIAGNOSIS — I10 Essential (primary) hypertension: Secondary | ICD-10-CM | POA: Diagnosis not present

## 2017-08-24 DIAGNOSIS — E790 Hyperuricemia without signs of inflammatory arthritis and tophaceous disease: Secondary | ICD-10-CM | POA: Diagnosis not present

## 2017-08-24 DIAGNOSIS — R490 Dysphonia: Secondary | ICD-10-CM | POA: Diagnosis not present

## 2017-08-24 DIAGNOSIS — R05 Cough: Secondary | ICD-10-CM | POA: Diagnosis not present

## 2017-08-24 DIAGNOSIS — R300 Dysuria: Secondary | ICD-10-CM | POA: Diagnosis not present

## 2017-08-24 DIAGNOSIS — N390 Urinary tract infection, site not specified: Secondary | ICD-10-CM | POA: Diagnosis not present

## 2017-08-24 DIAGNOSIS — E119 Type 2 diabetes mellitus without complications: Secondary | ICD-10-CM | POA: Diagnosis not present

## 2017-08-24 DIAGNOSIS — J069 Acute upper respiratory infection, unspecified: Secondary | ICD-10-CM | POA: Diagnosis not present

## 2017-09-02 DIAGNOSIS — E119 Type 2 diabetes mellitus without complications: Secondary | ICD-10-CM | POA: Diagnosis not present

## 2017-10-06 DIAGNOSIS — R944 Abnormal results of kidney function studies: Secondary | ICD-10-CM | POA: Diagnosis not present

## 2017-10-06 DIAGNOSIS — E119 Type 2 diabetes mellitus without complications: Secondary | ICD-10-CM | POA: Diagnosis not present

## 2017-10-06 DIAGNOSIS — Z1231 Encounter for screening mammogram for malignant neoplasm of breast: Secondary | ICD-10-CM | POA: Diagnosis not present

## 2017-10-06 DIAGNOSIS — I10 Essential (primary) hypertension: Secondary | ICD-10-CM | POA: Diagnosis not present

## 2017-10-06 DIAGNOSIS — R0609 Other forms of dyspnea: Secondary | ICD-10-CM | POA: Diagnosis not present

## 2017-10-06 DIAGNOSIS — I519 Heart disease, unspecified: Secondary | ICD-10-CM | POA: Diagnosis not present

## 2017-10-19 DIAGNOSIS — Z1231 Encounter for screening mammogram for malignant neoplasm of breast: Secondary | ICD-10-CM | POA: Diagnosis not present

## 2017-10-26 DIAGNOSIS — H26491 Other secondary cataract, right eye: Secondary | ICD-10-CM | POA: Diagnosis not present

## 2017-11-11 DIAGNOSIS — I10 Essential (primary) hypertension: Secondary | ICD-10-CM | POA: Diagnosis present

## 2017-11-11 DIAGNOSIS — R05 Cough: Secondary | ICD-10-CM | POA: Diagnosis not present

## 2017-11-11 DIAGNOSIS — R0902 Hypoxemia: Secondary | ICD-10-CM | POA: Diagnosis not present

## 2017-11-11 DIAGNOSIS — R785 Finding of other psychotropic drug in blood: Secondary | ICD-10-CM | POA: Diagnosis present

## 2017-11-11 DIAGNOSIS — Z7902 Long term (current) use of antithrombotics/antiplatelets: Secondary | ICD-10-CM | POA: Diagnosis not present

## 2017-11-11 DIAGNOSIS — F418 Other specified anxiety disorders: Secondary | ICD-10-CM | POA: Diagnosis present

## 2017-11-11 DIAGNOSIS — F17210 Nicotine dependence, cigarettes, uncomplicated: Secondary | ICD-10-CM | POA: Diagnosis present

## 2017-11-11 DIAGNOSIS — I2510 Atherosclerotic heart disease of native coronary artery without angina pectoris: Secondary | ICD-10-CM | POA: Diagnosis present

## 2017-11-11 DIAGNOSIS — Z79899 Other long term (current) drug therapy: Secondary | ICD-10-CM | POA: Diagnosis not present

## 2017-11-11 DIAGNOSIS — F4321 Adjustment disorder with depressed mood: Secondary | ICD-10-CM | POA: Diagnosis present

## 2017-11-11 DIAGNOSIS — J440 Chronic obstructive pulmonary disease with acute lower respiratory infection: Secondary | ICD-10-CM | POA: Diagnosis present

## 2017-11-11 DIAGNOSIS — R079 Chest pain, unspecified: Secondary | ICD-10-CM | POA: Diagnosis not present

## 2017-11-11 DIAGNOSIS — J441 Chronic obstructive pulmonary disease with (acute) exacerbation: Secondary | ICD-10-CM | POA: Diagnosis not present

## 2017-11-11 DIAGNOSIS — E785 Hyperlipidemia, unspecified: Secondary | ICD-10-CM | POA: Diagnosis present

## 2017-11-11 DIAGNOSIS — E119 Type 2 diabetes mellitus without complications: Secondary | ICD-10-CM | POA: Diagnosis present

## 2017-11-11 DIAGNOSIS — Z7982 Long term (current) use of aspirin: Secondary | ICD-10-CM | POA: Diagnosis not present

## 2017-11-11 DIAGNOSIS — J189 Pneumonia, unspecified organism: Secondary | ICD-10-CM | POA: Diagnosis not present

## 2017-11-11 DIAGNOSIS — J9601 Acute respiratory failure with hypoxia: Secondary | ICD-10-CM | POA: Diagnosis not present

## 2017-12-13 DIAGNOSIS — L84 Corns and callosities: Secondary | ICD-10-CM | POA: Diagnosis not present

## 2017-12-13 DIAGNOSIS — E662 Morbid (severe) obesity with alveolar hypoventilation: Secondary | ICD-10-CM | POA: Diagnosis not present

## 2017-12-13 DIAGNOSIS — F411 Generalized anxiety disorder: Secondary | ICD-10-CM | POA: Diagnosis not present

## 2017-12-13 DIAGNOSIS — E119 Type 2 diabetes mellitus without complications: Secondary | ICD-10-CM | POA: Diagnosis not present

## 2017-12-13 DIAGNOSIS — F17210 Nicotine dependence, cigarettes, uncomplicated: Secondary | ICD-10-CM | POA: Diagnosis not present

## 2017-12-13 DIAGNOSIS — I10 Essential (primary) hypertension: Secondary | ICD-10-CM | POA: Diagnosis not present

## 2017-12-13 DIAGNOSIS — I519 Heart disease, unspecified: Secondary | ICD-10-CM | POA: Diagnosis not present

## 2017-12-19 DIAGNOSIS — J205 Acute bronchitis due to respiratory syncytial virus: Secondary | ICD-10-CM | POA: Diagnosis not present

## 2017-12-22 DIAGNOSIS — F321 Major depressive disorder, single episode, moderate: Secondary | ICD-10-CM | POA: Diagnosis not present

## 2017-12-22 DIAGNOSIS — E782 Mixed hyperlipidemia: Secondary | ICD-10-CM | POA: Diagnosis not present

## 2017-12-22 DIAGNOSIS — R441 Visual hallucinations: Secondary | ICD-10-CM | POA: Diagnosis not present

## 2017-12-22 DIAGNOSIS — E1121 Type 2 diabetes mellitus with diabetic nephropathy: Secondary | ICD-10-CM | POA: Diagnosis not present

## 2017-12-22 DIAGNOSIS — L84 Corns and callosities: Secondary | ICD-10-CM | POA: Diagnosis not present

## 2017-12-22 DIAGNOSIS — J018 Other acute sinusitis: Secondary | ICD-10-CM | POA: Diagnosis not present

## 2017-12-22 DIAGNOSIS — E1142 Type 2 diabetes mellitus with diabetic polyneuropathy: Secondary | ICD-10-CM | POA: Diagnosis not present

## 2017-12-22 DIAGNOSIS — I119 Hypertensive heart disease without heart failure: Secondary | ICD-10-CM | POA: Diagnosis not present

## 2017-12-22 DIAGNOSIS — F5101 Primary insomnia: Secondary | ICD-10-CM | POA: Diagnosis not present

## 2017-12-22 DIAGNOSIS — J410 Simple chronic bronchitis: Secondary | ICD-10-CM | POA: Diagnosis not present

## 2017-12-22 DIAGNOSIS — I10 Essential (primary) hypertension: Secondary | ICD-10-CM | POA: Diagnosis not present

## 2018-01-04 ENCOUNTER — Ambulatory Visit (INDEPENDENT_AMBULATORY_CARE_PROVIDER_SITE_OTHER): Payer: Medicare Other | Admitting: Podiatry

## 2018-01-04 ENCOUNTER — Encounter: Payer: Self-pay | Admitting: Podiatry

## 2018-01-04 DIAGNOSIS — D689 Coagulation defect, unspecified: Secondary | ICD-10-CM | POA: Diagnosis not present

## 2018-01-04 DIAGNOSIS — E119 Type 2 diabetes mellitus without complications: Secondary | ICD-10-CM

## 2018-01-04 DIAGNOSIS — Q828 Other specified congenital malformations of skin: Secondary | ICD-10-CM

## 2018-01-04 NOTE — Progress Notes (Signed)
Subjective:  Patient ID: Lindsay Brooks, female    DOB: 08-18-1950,  MRN: [id number]  Chief Complaint  Patient presents with  . Callouses    B/L plantar x March; very sore Tx: none  . diabetic foot care    FBS: 124 A1C" don't know PCP: [facility] LOV: x 1 wk   67 y.o. female presents with the above complaint.  Ports painful calluses to both feet.  On the bottom of the feet.  Present since at least March.  Very sore.  Last and blood sugar 124 PCP Dr. Tobie Poet last seen last week.  Unsure of last A1c.  On plavix d/t coronary stents.  Past Medical History:  Diagnosis Date  . CAD (coronary artery disease)    a. s/p 2 CoStar DES to RCA 06/2004. b.  LHC 12/09:  EF 65%, oCFX 40%, oRCA 30%, then 30-40% leading into stented seg that is patent, (10-20% ISR), 40% beyond stent,;  c. EF 81%, poor exercise capacity, hypotensive BP response, + chest pain, normal nuclear images (overall low risk)  . Colon polyps    a. s/p removal 01/2012 - was told one might be cancerous but was not diagnosed with colon cancer - plan is for f/u colonoscopy in 5 yrs.  Marland Kitchen GERD (gastroesophageal reflux disease)   . Hx of echocardiogram    a. Echo 06/2011: EF 55-60%, Gr 1 diast dysfn, mild MR  . Mini stroke    History of, in 2005 (before she was diagnosed with CAD).  . Other and unspecified hyperlipidemia   . Other isolated or specific phobias   . Peripheral vascular disease, unspecified    a. s/p L common femoral & left illiac stenting previously (patent by angio in 2007). - ABI 06/2011 normal. b. Carotid US 0-39% BICA 06/2011 (for f/u 2 yrs);   c.  ABIs 9/13:  normal (1.1 bilaterally)  . Personal history of peptic ulcer disease    Remotely.  . Tobacco use disorder   . Type II or unspecified type diabetes mellitus without mention of complication, not stated as uncontrolled   . Unspecified essential hypertension    Past Surgical History:  Procedure Laterality Date  . CARDIAC CATHETERIZATION  2009   patent RCA stent. mild CAD  otherwise.   . hysterectomy (other)    . left arm surgery    . LEFT HEART CATHETERIZATION WITH CORONARY ANGIOGRAM N/A 01/16/2014   Procedure: LEFT HEART CATHETERIZATION WITH CORONARY ANGIOGRAM;  Surgeon: Blane Ohara, MD;  Location: [facility] CATH LAB;  Service: Cardiovascular;  Laterality: N/A;  . left leg angiography     stenting of the left common iliac artery  . left rotator cuff repair    . right coronary artery stent      Current Outpatient Medications:  .  ALPRAZolam (XANAX) 0.5 MG tablet, Take 1-2 tabs, 30-60 min prior to PET scan., Disp: 2 tablet, Rfl: 0 .  aspirin EC 81 MG tablet, Take 81 mg by mouth daily., Disp: , Rfl:  .  atenolol (TENORMIN) 50 MG tablet, Take 75 mg by mouth 2 (two) times daily. , Disp: , Rfl:  .  clopidogrel (PLAVIX) 75 MG tablet, Take 75 mg by mouth daily at 12 noon. , Disp: , Rfl:  .  DULoxetine (CYMBALTA) 60 MG capsule, Take 120 mg by mouth at bedtime. , Disp: , Rfl:  .  esomeprazole (NEXIUM) 40 MG capsule, Take 40 mg by mouth 2 (two) times daily. , Disp: , Rfl:  .  fenofibrate (TRICOR)  145 MG tablet, Take 145 mg by mouth daily., Disp: , Rfl:  .  fexofenadine (ALLEGRA) 180 MG tablet, Take 180 mg by mouth daily., Disp: , Rfl:  .  fluticasone (FLONASE) 50 MCG/ACT nasal spray, Place 2 sprays into the nose daily., Disp: , Rfl:  .  gabapentin (NEURONTIN) 600 MG tablet, Take 600 mg by mouth 3 (three) times daily., Disp: , Rfl:  .  hydrochlorothiazide 25 MG tablet, Take 25 mg by mouth daily.  , Disp: , Rfl:  .  LORazepam (ATIVAN) 0.5 MG tablet, Take 0.5 mg by mouth daily as needed for anxiety. , Disp: , Rfl:  .  nitroGLYCERIN (NITROSTAT) 0.4 MG SL tablet, Place 0.4 mg under the tongue every 5 (five) minutes as needed. For chest pain, Disp: , Rfl:  .  potassium chloride (MICRO-K) 10 MEQ CR capsule, Take 10 mEq by mouth daily at 12 noon. , Disp: , Rfl:  .  QUEtiapine (SEROQUEL) 50 MG tablet, Take 50 mg by mouth at bedtime.  , Disp: , Rfl:  .  rosuvastatin (CRESTOR) 40  MG tablet, Take 1 tablet (40 mg total) by mouth daily., Disp: 90 tablet, Rfl: 3 .  sitaGLIPtin-metformin (JANUMET) 50-1000 MG per tablet, Take 1 tablet by mouth daily. , Disp: , Rfl:  .  valsartan (DIOVAN) 320 MG tablet, Take 320 mg by mouth daily at 12 noon. , Disp: , Rfl:  .  Vitamin D, Ergocalciferol, (DRISDOL) 50000 UNITS CAPS capsule, Take 50,000 Units by mouth every 7 (seven) days. Takes on Wednesdays, Disp: , Rfl:  .  zolpidem (AMBIEN) 10 MG tablet, Take 10 mg by mouth at bedtime as needed for sleep. , Disp: , Rfl:   Allergies  Allergen Reactions  . Celecoxib Hives  . Insulins Nausea And Vomiting    Patient unsure of name of insulin, will call us back  . Vioxx [Rofecoxib] Hives   Review of Systems: Negative except as noted in the HPI. Denies N/V/F/Ch. Objective:  There were no vitals filed for this visit. General AA&O x3. Normal mood and affect.  Vascular Dorsalis pedis and posterior tibial pulses  present 2+ bilaterally  Capillary refill normal to all digits. Pedal hair growth normal.  Neurologic Epicritic sensation grossly present.  Dermatologic No open lesions. Interspaces clear of maceration. Nails well groomed and normal in appearance. HPK L submet 2, heel, R submet 5  Orthopedic: MMT 5/5 in dorsiflexion, plantarflexion, inversion, and eversion. Normal joint ROM without pain or crepitus.   Assessment & Plan:  Patient was evaluated and treated and all questions answered.  Diabetes with Coagulation Defect -Educated on diabetic footcare. Diabetic risk level 1 -Routine foot care as below.  Procedure: Paring of Lesion Rationale: painful hyperkeratotic lesion Type of Debridement: manual, sharp debridement. Instrumentation: 15 blade Number of Lesions: 3   No follow-ups on file.

## 2018-01-07 DIAGNOSIS — E1121 Type 2 diabetes mellitus with diabetic nephropathy: Secondary | ICD-10-CM | POA: Diagnosis not present

## 2018-01-07 DIAGNOSIS — F17210 Nicotine dependence, cigarettes, uncomplicated: Secondary | ICD-10-CM | POA: Diagnosis not present

## 2018-01-07 DIAGNOSIS — F5101 Primary insomnia: Secondary | ICD-10-CM | POA: Diagnosis not present

## 2018-01-07 DIAGNOSIS — F330 Major depressive disorder, recurrent, mild: Secondary | ICD-10-CM | POA: Diagnosis not present

## 2018-01-07 DIAGNOSIS — E1142 Type 2 diabetes mellitus with diabetic polyneuropathy: Secondary | ICD-10-CM | POA: Diagnosis not present

## 2018-01-07 DIAGNOSIS — N183 Chronic kidney disease, stage 3 (moderate): Secondary | ICD-10-CM | POA: Diagnosis not present

## 2018-01-14 DIAGNOSIS — I2510 Atherosclerotic heart disease of native coronary artery without angina pectoris: Secondary | ICD-10-CM | POA: Diagnosis not present

## 2018-01-14 DIAGNOSIS — J439 Emphysema, unspecified: Secondary | ICD-10-CM | POA: Diagnosis not present

## 2018-01-14 DIAGNOSIS — E785 Hyperlipidemia, unspecified: Secondary | ICD-10-CM | POA: Diagnosis not present

## 2018-01-14 DIAGNOSIS — Z720 Tobacco use: Secondary | ICD-10-CM | POA: Diagnosis not present

## 2018-01-14 DIAGNOSIS — I10 Essential (primary) hypertension: Secondary | ICD-10-CM | POA: Diagnosis not present

## 2018-02-03 DIAGNOSIS — I119 Hypertensive heart disease without heart failure: Secondary | ICD-10-CM | POA: Diagnosis not present

## 2018-02-03 DIAGNOSIS — F17210 Nicotine dependence, cigarettes, uncomplicated: Secondary | ICD-10-CM | POA: Diagnosis not present

## 2018-02-03 DIAGNOSIS — R197 Diarrhea, unspecified: Secondary | ICD-10-CM | POA: Diagnosis not present

## 2018-03-03 DIAGNOSIS — E782 Mixed hyperlipidemia: Secondary | ICD-10-CM | POA: Diagnosis not present

## 2018-03-03 DIAGNOSIS — F5102 Adjustment insomnia: Secondary | ICD-10-CM | POA: Diagnosis not present

## 2018-03-03 DIAGNOSIS — F17210 Nicotine dependence, cigarettes, uncomplicated: Secondary | ICD-10-CM | POA: Diagnosis not present

## 2018-03-03 DIAGNOSIS — G250 Essential tremor: Secondary | ICD-10-CM | POA: Diagnosis not present

## 2018-03-03 DIAGNOSIS — F330 Major depressive disorder, recurrent, mild: Secondary | ICD-10-CM | POA: Diagnosis not present

## 2018-03-03 DIAGNOSIS — I119 Hypertensive heart disease without heart failure: Secondary | ICD-10-CM | POA: Diagnosis not present

## 2018-03-08 ENCOUNTER — Ambulatory Visit: Payer: Medicare Other | Admitting: Podiatry

## 2018-03-10 ENCOUNTER — Other Ambulatory Visit: Payer: Self-pay

## 2018-03-15 ENCOUNTER — Ambulatory Visit (INDEPENDENT_AMBULATORY_CARE_PROVIDER_SITE_OTHER): Payer: Medicare Other | Admitting: Podiatry

## 2018-03-15 DIAGNOSIS — D689 Coagulation defect, unspecified: Secondary | ICD-10-CM

## 2018-03-15 DIAGNOSIS — Q828 Other specified congenital malformations of skin: Secondary | ICD-10-CM | POA: Diagnosis not present

## 2018-03-15 DIAGNOSIS — E1151 Type 2 diabetes mellitus with diabetic peripheral angiopathy without gangrene: Secondary | ICD-10-CM | POA: Diagnosis not present

## 2018-03-15 DIAGNOSIS — R29898 Other symptoms and signs involving the musculoskeletal system: Secondary | ICD-10-CM

## 2018-03-15 DIAGNOSIS — M2041 Other hammer toe(s) (acquired), right foot: Secondary | ICD-10-CM | POA: Diagnosis not present

## 2018-03-15 DIAGNOSIS — M2042 Other hammer toe(s) (acquired), left foot: Secondary | ICD-10-CM | POA: Diagnosis not present

## 2018-03-15 DIAGNOSIS — M2011 Hallux valgus (acquired), right foot: Secondary | ICD-10-CM | POA: Diagnosis not present

## 2018-03-15 DIAGNOSIS — M2012 Hallux valgus (acquired), left foot: Secondary | ICD-10-CM | POA: Diagnosis not present

## 2018-03-15 DIAGNOSIS — L909 Atrophic disorder of skin, unspecified: Secondary | ICD-10-CM

## 2018-03-15 NOTE — Progress Notes (Signed)
Subjective:  Patient ID: Lindsay Brooks, female    DOB: [birth year],  MRN: [id number]  Chief Complaint  Patient presents with  . Callouses    B/L callus trimming   67 y.o. female presents with the above complaint.  Reports continued painful calluses. Presents in thin flip flops today. Requests DM shoes. Sugar this AM 119.  On plavix d/t coronary stents.  Past Medical History:  Diagnosis Date  . CAD (coronary artery disease)    a. s/p 2 CoStar DES to RCA 06/2004. b.  LHC 12/09:  EF 65%, oCFX 40%, oRCA 30%, then 30-40% leading into stented seg that is patent, (10-20% ISR), 40% beyond stent,;  c. EF 81%, poor exercise capacity, hypotensive BP response, + chest pain, normal nuclear images (overall low risk)  . Colon polyps    a. s/p removal 01/2012 - was told one might be cancerous but was not diagnosed with colon cancer - plan is for f/u colonoscopy in 5 yrs.  Marland Kitchen GERD (gastroesophageal reflux disease)   . Hx of echocardiogram    a. Echo 06/2011: EF 55-60%, Gr 1 diast dysfn, mild MR  . Mini stroke ([place])    History of, in 2005 (before she was diagnosed with CAD).  . Other and unspecified hyperlipidemia   . Other isolated or specific phobias   . Peripheral vascular disease, unspecified ([place])    a. s/p L common femoral & left illiac stenting previously (patent by angio in 2007). - ABI 06/2011 normal. b. Carotid US 0-39% BICA 06/2011 (for f/u 2 yrs);   c.  ABIs 9/13:  normal (1.1 bilaterally)  . Personal history of peptic ulcer disease    Remotely.  . Tobacco use disorder   . Type II or unspecified type diabetes mellitus without mention of complication, not stated as uncontrolled   . Unspecified essential hypertension    Past Surgical History:  Procedure Laterality Date  . CARDIAC CATHETERIZATION  2009   patent RCA stent. mild CAD otherwise.   . hysterectomy (other)    . left arm surgery    . LEFT HEART CATHETERIZATION WITH CORONARY ANGIOGRAM N/A 01/16/2014   Procedure: LEFT HEART  CATHETERIZATION WITH CORONARY ANGIOGRAM;  Surgeon: Blane Ohara, MD;  Location: [facility] CATH LAB;  Service: Cardiovascular;  Laterality: N/A;  . left leg angiography     stenting of the left common iliac artery  . left rotator cuff repair    . right coronary artery stent      Current Outpatient Medications:  .  ALPRAZolam (XANAX) 0.5 MG tablet, Take 1-2 tabs, 30-60 min prior to PET scan., Disp: 2 tablet, Rfl: 0 .  aspirin EC 81 MG tablet, Take 81 mg by mouth daily., Disp: , Rfl:  .  atenolol (TENORMIN) 50 MG tablet, Take 75 mg by mouth 2 (two) times daily. , Disp: , Rfl:  .  clopidogrel (PLAVIX) 75 MG tablet, Take 75 mg by mouth daily at 12 noon. , Disp: , Rfl:  .  DULoxetine (CYMBALTA) 60 MG capsule, Take 120 mg by mouth at bedtime. , Disp: , Rfl:  .  esomeprazole (NEXIUM) 40 MG capsule, Take 40 mg by mouth 2 (two) times daily. , Disp: , Rfl:  .  fenofibrate (TRICOR) 145 MG tablet, Take 145 mg by mouth daily., Disp: , Rfl:  .  fexofenadine (ALLEGRA) 180 MG tablet, Take 180 mg by mouth daily., Disp: , Rfl:  .  fluticasone (FLONASE) 50 MCG/ACT nasal spray, Place 2 sprays into the nose daily.,  Disp: , Rfl:  .  gabapentin (NEURONTIN) 600 MG tablet, Take 600 mg by mouth 3 (three) times daily., Disp: , Rfl:  .  hydrochlorothiazide 25 MG tablet, Take 25 mg by mouth daily.  , Disp: , Rfl:  .  LORazepam (ATIVAN) 0.5 MG tablet, Take 0.5 mg by mouth daily as needed for anxiety. , Disp: , Rfl:  .  nitroGLYCERIN (NITROSTAT) 0.4 MG SL tablet, Place 0.4 mg under the tongue every 5 (five) minutes as needed. For chest pain, Disp: , Rfl:  .  potassium chloride (MICRO-K) 10 MEQ CR capsule, Take 10 mEq by mouth daily at 12 noon. , Disp: , Rfl:  .  QUEtiapine (SEROQUEL) 50 MG tablet, Take 50 mg by mouth at bedtime.  , Disp: , Rfl:  .  rosuvastatin (CRESTOR) 40 MG tablet, Take 1 tablet (40 mg total) by mouth daily., Disp: 90 tablet, Rfl: 3 .  sitaGLIPtin-metformin (JANUMET) 50-1000 MG per tablet, Take 1 tablet by  mouth daily. , Disp: , Rfl:  .  valsartan (DIOVAN) 320 MG tablet, Take 320 mg by mouth daily at 12 noon. , Disp: , Rfl:  .  Vitamin D, Ergocalciferol, (DRISDOL) 50000 UNITS CAPS capsule, Take 50,000 Units by mouth every 7 (seven) days. Takes on Wednesdays, Disp: , Rfl:  .  zolpidem (AMBIEN) 10 MG tablet, Take 10 mg by mouth at bedtime as needed for sleep. , Disp: , Rfl:   Allergies  Allergen Reactions  . Celecoxib Hives  . Insulins Nausea And Vomiting    Patient unsure of name of insulin, will call us back  . Vioxx [Rofecoxib] Hives   Review of Systems: Negative except as noted in the HPI. Denies N/V/F/Ch. Objective:  There were no vitals filed for this visit. General AA&O x3. Normal mood and affect.  Vascular Dorsalis pedis and posterior tibial pulses  present 2+ and absent bilaterally  Capillary refill normal to all digits. Pedal hair growth normal.  Neurologic Epicritic sensation grossly present.  Dermatologic No open lesions. Interspaces clear of maceration. Nails well groomed and normal in appearance. HPK L submet 2, heel bilat, R submet 5  Orthopedic: MMT 5/5 in dorsiflexion, plantarflexion, inversion, and eversion. Normal joint ROM without pain or crepitus.   Assessment & Plan:  Patient was evaluated and treated and all questions answered.  Diabetes with Coagulation Defect -Educated on diabetic footcare. Diabetic risk level 1 -Routine foot care as below.  Procedure: Paring of Lesion Rationale: painful hyperkeratotic lesion Type of Debridement: manual, sharp debridement. Instrumentation: 312 blade Number of Lesions: 4   DM with HAV, HTs, Fat Pat Atrophy -Discussed with patient should benefit from diabetic shoes given the foot deformities pre-ulcerative calluses and fat pad atrophy.  No follow-ups on file.

## 2018-04-14 ENCOUNTER — Ambulatory Visit: Payer: Medicare Other | Admitting: *Deleted

## 2018-04-14 DIAGNOSIS — Q828 Other specified congenital malformations of skin: Secondary | ICD-10-CM

## 2018-04-14 DIAGNOSIS — M2041 Other hammer toe(s) (acquired), right foot: Secondary | ICD-10-CM

## 2018-04-14 DIAGNOSIS — M2011 Hallux valgus (acquired), right foot: Secondary | ICD-10-CM

## 2018-04-14 DIAGNOSIS — E1151 Type 2 diabetes mellitus with diabetic peripheral angiopathy without gangrene: Secondary | ICD-10-CM

## 2018-04-14 DIAGNOSIS — M2042 Other hammer toe(s) (acquired), left foot: Secondary | ICD-10-CM

## 2018-04-14 DIAGNOSIS — M2012 Hallux valgus (acquired), left foot: Secondary | ICD-10-CM

## 2018-05-06 DIAGNOSIS — S29012A Strain of muscle and tendon of back wall of thorax, initial encounter: Secondary | ICD-10-CM | POA: Diagnosis not present

## 2018-05-06 DIAGNOSIS — S29019A Strain of muscle and tendon of unspecified wall of thorax, initial encounter: Secondary | ICD-10-CM | POA: Diagnosis not present

## 2018-05-06 DIAGNOSIS — M546 Pain in thoracic spine: Secondary | ICD-10-CM | POA: Diagnosis not present

## 2018-05-12 ENCOUNTER — Ambulatory Visit (INDEPENDENT_AMBULATORY_CARE_PROVIDER_SITE_OTHER): Payer: Medicare Other | Admitting: *Deleted

## 2018-05-12 DIAGNOSIS — L84 Corns and callosities: Secondary | ICD-10-CM

## 2018-05-12 DIAGNOSIS — M2012 Hallux valgus (acquired), left foot: Secondary | ICD-10-CM | POA: Diagnosis not present

## 2018-05-12 DIAGNOSIS — M2011 Hallux valgus (acquired), right foot: Secondary | ICD-10-CM

## 2018-05-12 DIAGNOSIS — M2041 Other hammer toe(s) (acquired), right foot: Secondary | ICD-10-CM | POA: Diagnosis not present

## 2018-05-12 DIAGNOSIS — M2042 Other hammer toe(s) (acquired), left foot: Secondary | ICD-10-CM

## 2018-05-12 DIAGNOSIS — E1151 Type 2 diabetes mellitus with diabetic peripheral angiopathy without gangrene: Secondary | ICD-10-CM

## 2018-05-23 DIAGNOSIS — N3000 Acute cystitis without hematuria: Secondary | ICD-10-CM | POA: Diagnosis not present

## 2018-05-23 DIAGNOSIS — E1121 Type 2 diabetes mellitus with diabetic nephropathy: Secondary | ICD-10-CM | POA: Diagnosis not present

## 2018-05-23 DIAGNOSIS — J410 Simple chronic bronchitis: Secondary | ICD-10-CM | POA: Diagnosis not present

## 2018-05-23 DIAGNOSIS — R042 Hemoptysis: Secondary | ICD-10-CM | POA: Diagnosis not present

## 2018-05-23 DIAGNOSIS — E1142 Type 2 diabetes mellitus with diabetic polyneuropathy: Secondary | ICD-10-CM | POA: Diagnosis not present

## 2018-05-23 DIAGNOSIS — Z1159 Encounter for screening for other viral diseases: Secondary | ICD-10-CM | POA: Diagnosis not present

## 2018-05-23 DIAGNOSIS — E782 Mixed hyperlipidemia: Secondary | ICD-10-CM | POA: Diagnosis not present

## 2018-05-23 DIAGNOSIS — F330 Major depressive disorder, recurrent, mild: Secondary | ICD-10-CM | POA: Diagnosis not present

## 2018-05-23 DIAGNOSIS — Z23 Encounter for immunization: Secondary | ICD-10-CM | POA: Diagnosis not present

## 2018-05-23 DIAGNOSIS — I119 Hypertensive heart disease without heart failure: Secondary | ICD-10-CM | POA: Diagnosis not present

## 2018-05-24 DIAGNOSIS — Z202 Contact with and (suspected) exposure to infections with a predominantly sexual mode of transmission: Secondary | ICD-10-CM | POA: Diagnosis not present

## 2018-05-24 DIAGNOSIS — K760 Fatty (change of) liver, not elsewhere classified: Secondary | ICD-10-CM | POA: Diagnosis not present

## 2018-05-24 DIAGNOSIS — E1142 Type 2 diabetes mellitus with diabetic polyneuropathy: Secondary | ICD-10-CM | POA: Diagnosis not present

## 2018-05-24 DIAGNOSIS — R194 Change in bowel habit: Secondary | ICD-10-CM | POA: Diagnosis not present

## 2018-05-24 DIAGNOSIS — I119 Hypertensive heart disease without heart failure: Secondary | ICD-10-CM | POA: Diagnosis not present

## 2018-05-24 DIAGNOSIS — Z1159 Encounter for screening for other viral diseases: Secondary | ICD-10-CM | POA: Diagnosis not present

## 2018-05-24 DIAGNOSIS — E782 Mixed hyperlipidemia: Secondary | ICD-10-CM | POA: Diagnosis not present

## 2018-06-09 NOTE — Progress Notes (Signed)
Patient ID: Lindsay Brooks, female   DOB: Jul 01, 1951, 67 y.o.   MRN: [id number]  Patient presents for diabetic shoe pick up, shoes are tried on for good fit.  Patient received 1 pair [employer] Women - Breeze Athletic Knit - FitLite Collection PINK in 7.5 medium and 3 pairs custom molded diabetic inserts.  Verbal and written break in and wear instructions given.

## 2018-06-09 NOTE — Patient Instructions (Signed)

## 2018-06-14 ENCOUNTER — Ambulatory Visit: Payer: Medicare Other | Admitting: Podiatry

## 2018-06-14 DIAGNOSIS — Z13820 Encounter for screening for osteoporosis: Secondary | ICD-10-CM | POA: Diagnosis not present

## 2018-06-14 DIAGNOSIS — Z23 Encounter for immunization: Secondary | ICD-10-CM | POA: Diagnosis not present

## 2018-06-14 DIAGNOSIS — Z6825 Body mass index (BMI) 25.0-25.9, adult: Secondary | ICD-10-CM | POA: Diagnosis not present

## 2018-06-14 DIAGNOSIS — Z1211 Encounter for screening for malignant neoplasm of colon: Secondary | ICD-10-CM | POA: Diagnosis not present

## 2018-06-14 DIAGNOSIS — Z1231 Encounter for screening mammogram for malignant neoplasm of breast: Secondary | ICD-10-CM | POA: Diagnosis not present

## 2018-06-14 DIAGNOSIS — Z0000 Encounter for general adult medical examination without abnormal findings: Secondary | ICD-10-CM | POA: Diagnosis not present

## 2018-06-20 ENCOUNTER — Ambulatory Visit: Payer: Medicare Other | Admitting: Podiatry

## 2018-06-23 NOTE — Progress Notes (Signed)
Patient ID: Lindsay Brooks, female   DOB: 1951-05-29, 67 y.o.   MRN: [id number]   Patient presents at Dr Price's request to be measured for diabetic shoes and inserts with [facility] Certified Pedorthist.  Patient will be called when shoes and inserts arrive to schedule a fitting.

## 2018-07-18 DIAGNOSIS — A048 Other specified bacterial intestinal infections: Secondary | ICD-10-CM | POA: Diagnosis not present

## 2018-07-18 DIAGNOSIS — R194 Change in bowel habit: Secondary | ICD-10-CM | POA: Diagnosis not present

## 2018-08-17 DIAGNOSIS — I739 Peripheral vascular disease, unspecified: Secondary | ICD-10-CM | POA: Diagnosis not present

## 2018-08-17 DIAGNOSIS — I2510 Atherosclerotic heart disease of native coronary artery without angina pectoris: Secondary | ICD-10-CM | POA: Diagnosis not present

## 2018-08-17 DIAGNOSIS — Z720 Tobacco use: Secondary | ICD-10-CM | POA: Diagnosis not present

## 2018-08-17 DIAGNOSIS — I10 Essential (primary) hypertension: Secondary | ICD-10-CM | POA: Diagnosis not present

## 2018-08-17 DIAGNOSIS — Z959 Presence of cardiac and vascular implant and graft, unspecified: Secondary | ICD-10-CM | POA: Diagnosis not present

## 2018-08-17 DIAGNOSIS — E785 Hyperlipidemia, unspecified: Secondary | ICD-10-CM | POA: Diagnosis not present

## 2018-08-17 DIAGNOSIS — Z955 Presence of coronary angioplasty implant and graft: Secondary | ICD-10-CM | POA: Diagnosis not present

## 2018-08-25 DIAGNOSIS — I119 Hypertensive heart disease without heart failure: Secondary | ICD-10-CM | POA: Diagnosis not present

## 2018-08-25 DIAGNOSIS — E782 Mixed hyperlipidemia: Secondary | ICD-10-CM | POA: Diagnosis not present

## 2018-08-25 DIAGNOSIS — N183 Chronic kidney disease, stage 3 (moderate): Secondary | ICD-10-CM | POA: Diagnosis not present

## 2018-08-25 DIAGNOSIS — J410 Simple chronic bronchitis: Secondary | ICD-10-CM | POA: Diagnosis not present

## 2018-08-25 DIAGNOSIS — F330 Major depressive disorder, recurrent, mild: Secondary | ICD-10-CM | POA: Diagnosis not present

## 2018-08-25 DIAGNOSIS — E1121 Type 2 diabetes mellitus with diabetic nephropathy: Secondary | ICD-10-CM | POA: Diagnosis not present

## 2018-09-22 DIAGNOSIS — I739 Peripheral vascular disease, unspecified: Secondary | ICD-10-CM | POA: Diagnosis not present

## 2018-11-21 DIAGNOSIS — R1310 Dysphagia, unspecified: Secondary | ICD-10-CM | POA: Diagnosis present

## 2018-11-21 DIAGNOSIS — G459 Transient cerebral ischemic attack, unspecified: Secondary | ICD-10-CM | POA: Diagnosis not present

## 2018-11-21 DIAGNOSIS — I491 Atrial premature depolarization: Secondary | ICD-10-CM | POA: Diagnosis not present

## 2018-11-21 DIAGNOSIS — I2510 Atherosclerotic heart disease of native coronary artery without angina pectoris: Secondary | ICD-10-CM | POA: Diagnosis not present

## 2018-11-21 DIAGNOSIS — I6522 Occlusion and stenosis of left carotid artery: Secondary | ICD-10-CM | POA: Diagnosis present

## 2018-11-21 DIAGNOSIS — I10 Essential (primary) hypertension: Secondary | ICD-10-CM | POA: Diagnosis not present

## 2018-11-21 DIAGNOSIS — R531 Weakness: Secondary | ICD-10-CM | POA: Diagnosis not present

## 2018-11-21 DIAGNOSIS — E1151 Type 2 diabetes mellitus with diabetic peripheral angiopathy without gangrene: Secondary | ICD-10-CM | POA: Diagnosis present

## 2018-11-21 DIAGNOSIS — Z959 Presence of cardiac and vascular implant and graft, unspecified: Secondary | ICD-10-CM | POA: Diagnosis not present

## 2018-11-21 DIAGNOSIS — I959 Hypotension, unspecified: Secondary | ICD-10-CM | POA: Diagnosis present

## 2018-11-21 DIAGNOSIS — R200 Anesthesia of skin: Secondary | ICD-10-CM | POA: Diagnosis not present

## 2018-11-21 DIAGNOSIS — R2990 Unspecified symptoms and signs involving the nervous system: Secondary | ICD-10-CM | POA: Diagnosis not present

## 2018-11-21 DIAGNOSIS — Z833 Family history of diabetes mellitus: Secondary | ICD-10-CM | POA: Diagnosis not present

## 2018-11-21 DIAGNOSIS — E119 Type 2 diabetes mellitus without complications: Secondary | ICD-10-CM | POA: Diagnosis not present

## 2018-11-21 DIAGNOSIS — T426X5A Adverse effect of other antiepileptic and sedative-hypnotic drugs, initial encounter: Secondary | ICD-10-CM | POA: Diagnosis not present

## 2018-11-21 DIAGNOSIS — I739 Peripheral vascular disease, unspecified: Secondary | ICD-10-CM | POA: Diagnosis not present

## 2018-11-21 DIAGNOSIS — Z7902 Long term (current) use of antithrombotics/antiplatelets: Secondary | ICD-10-CM | POA: Diagnosis not present

## 2018-11-21 DIAGNOSIS — H53462 Homonymous bilateral field defects, left side: Secondary | ICD-10-CM | POA: Diagnosis not present

## 2018-11-21 DIAGNOSIS — I639 Cerebral infarction, unspecified: Secondary | ICD-10-CM | POA: Diagnosis not present

## 2018-11-21 DIAGNOSIS — E1140 Type 2 diabetes mellitus with diabetic neuropathy, unspecified: Secondary | ICD-10-CM | POA: Diagnosis present

## 2018-11-21 DIAGNOSIS — I340 Nonrheumatic mitral (valve) insufficiency: Secondary | ICD-10-CM | POA: Diagnosis not present

## 2018-11-21 DIAGNOSIS — Z7984 Long term (current) use of oral hypoglycemic drugs: Secondary | ICD-10-CM | POA: Diagnosis not present

## 2018-11-21 DIAGNOSIS — J449 Chronic obstructive pulmonary disease, unspecified: Secondary | ICD-10-CM | POA: Diagnosis not present

## 2018-11-21 DIAGNOSIS — F17210 Nicotine dependence, cigarettes, uncomplicated: Secondary | ICD-10-CM | POA: Diagnosis present

## 2018-11-21 DIAGNOSIS — Z8249 Family history of ischemic heart disease and other diseases of the circulatory system: Secondary | ICD-10-CM | POA: Diagnosis not present

## 2018-11-21 DIAGNOSIS — R479 Unspecified speech disturbances: Secondary | ICD-10-CM | POA: Diagnosis not present

## 2018-11-21 DIAGNOSIS — Z955 Presence of coronary angioplasty implant and graft: Secondary | ICD-10-CM | POA: Diagnosis not present

## 2018-11-21 DIAGNOSIS — Z7409 Other reduced mobility: Secondary | ICD-10-CM | POA: Diagnosis not present

## 2018-11-21 DIAGNOSIS — Z90710 Acquired absence of both cervix and uterus: Secondary | ICD-10-CM | POA: Diagnosis not present

## 2018-11-21 DIAGNOSIS — E785 Hyperlipidemia, unspecified: Secondary | ICD-10-CM | POA: Diagnosis present

## 2018-11-21 DIAGNOSIS — R42 Dizziness and giddiness: Secondary | ICD-10-CM | POA: Diagnosis not present

## 2018-11-21 DIAGNOSIS — Z7982 Long term (current) use of aspirin: Secondary | ICD-10-CM | POA: Diagnosis not present

## 2018-11-21 DIAGNOSIS — Z9181 History of falling: Secondary | ICD-10-CM | POA: Diagnosis not present

## 2018-11-21 DIAGNOSIS — Z9049 Acquired absence of other specified parts of digestive tract: Secondary | ICD-10-CM | POA: Diagnosis not present

## 2018-11-21 DIAGNOSIS — R4781 Slurred speech: Secondary | ICD-10-CM | POA: Diagnosis not present

## 2018-11-21 DIAGNOSIS — N3090 Cystitis, unspecified without hematuria: Secondary | ICD-10-CM | POA: Diagnosis not present

## 2018-11-25 DIAGNOSIS — E1142 Type 2 diabetes mellitus with diabetic polyneuropathy: Secondary | ICD-10-CM | POA: Diagnosis not present

## 2018-11-25 DIAGNOSIS — H53462 Homonymous bilateral field defects, left side: Secondary | ICD-10-CM | POA: Diagnosis not present

## 2018-11-25 DIAGNOSIS — J449 Chronic obstructive pulmonary disease, unspecified: Secondary | ICD-10-CM | POA: Diagnosis not present

## 2018-11-25 DIAGNOSIS — R531 Weakness: Secondary | ICD-10-CM | POA: Diagnosis not present

## 2018-11-25 DIAGNOSIS — I2510 Atherosclerotic heart disease of native coronary artery without angina pectoris: Secondary | ICD-10-CM | POA: Diagnosis not present

## 2018-11-25 DIAGNOSIS — R1310 Dysphagia, unspecified: Secondary | ICD-10-CM | POA: Diagnosis not present

## 2018-11-25 DIAGNOSIS — R202 Paresthesia of skin: Secondary | ICD-10-CM | POA: Diagnosis not present

## 2018-11-25 DIAGNOSIS — E1151 Type 2 diabetes mellitus with diabetic peripheral angiopathy without gangrene: Secondary | ICD-10-CM | POA: Diagnosis not present

## 2018-11-25 DIAGNOSIS — I70213 Atherosclerosis of native arteries of extremities with intermittent claudication, bilateral legs: Secondary | ICD-10-CM | POA: Diagnosis not present

## 2018-11-25 DIAGNOSIS — R471 Dysarthria and anarthria: Secondary | ICD-10-CM | POA: Diagnosis not present

## 2018-11-25 DIAGNOSIS — E785 Hyperlipidemia, unspecified: Secondary | ICD-10-CM | POA: Diagnosis not present

## 2018-11-25 DIAGNOSIS — F17210 Nicotine dependence, cigarettes, uncomplicated: Secondary | ICD-10-CM | POA: Diagnosis not present

## 2018-11-25 DIAGNOSIS — Z7984 Long term (current) use of oral hypoglycemic drugs: Secondary | ICD-10-CM | POA: Diagnosis not present

## 2018-11-25 DIAGNOSIS — I10 Essential (primary) hypertension: Secondary | ICD-10-CM | POA: Diagnosis not present

## 2018-11-28 DIAGNOSIS — E1151 Type 2 diabetes mellitus with diabetic peripheral angiopathy without gangrene: Secondary | ICD-10-CM | POA: Diagnosis not present

## 2018-11-28 DIAGNOSIS — J449 Chronic obstructive pulmonary disease, unspecified: Secondary | ICD-10-CM | POA: Diagnosis not present

## 2018-11-28 DIAGNOSIS — H53462 Homonymous bilateral field defects, left side: Secondary | ICD-10-CM | POA: Diagnosis not present

## 2018-11-28 DIAGNOSIS — I70213 Atherosclerosis of native arteries of extremities with intermittent claudication, bilateral legs: Secondary | ICD-10-CM | POA: Diagnosis not present

## 2018-11-28 DIAGNOSIS — I2510 Atherosclerotic heart disease of native coronary artery without angina pectoris: Secondary | ICD-10-CM | POA: Diagnosis not present

## 2018-11-28 DIAGNOSIS — R1310 Dysphagia, unspecified: Secondary | ICD-10-CM | POA: Diagnosis not present

## 2018-11-29 DIAGNOSIS — I70213 Atherosclerosis of native arteries of extremities with intermittent claudication, bilateral legs: Secondary | ICD-10-CM | POA: Diagnosis not present

## 2018-11-29 DIAGNOSIS — R1310 Dysphagia, unspecified: Secondary | ICD-10-CM | POA: Diagnosis not present

## 2018-11-29 DIAGNOSIS — J449 Chronic obstructive pulmonary disease, unspecified: Secondary | ICD-10-CM | POA: Diagnosis not present

## 2018-11-29 DIAGNOSIS — H53462 Homonymous bilateral field defects, left side: Secondary | ICD-10-CM | POA: Diagnosis not present

## 2018-11-29 DIAGNOSIS — I2510 Atherosclerotic heart disease of native coronary artery without angina pectoris: Secondary | ICD-10-CM | POA: Diagnosis not present

## 2018-11-29 DIAGNOSIS — E1151 Type 2 diabetes mellitus with diabetic peripheral angiopathy without gangrene: Secondary | ICD-10-CM | POA: Diagnosis not present

## 2018-11-30 DIAGNOSIS — I700 Atherosclerosis of aorta: Secondary | ICD-10-CM | POA: Diagnosis not present

## 2018-11-30 DIAGNOSIS — I69952 Hemiplegia and hemiparesis following unspecified cerebrovascular disease affecting left dominant side: Secondary | ICD-10-CM | POA: Diagnosis not present

## 2018-11-30 DIAGNOSIS — F17219 Nicotine dependence, cigarettes, with unspecified nicotine-induced disorders: Secondary | ICD-10-CM | POA: Diagnosis not present

## 2018-11-30 DIAGNOSIS — I119 Hypertensive heart disease without heart failure: Secondary | ICD-10-CM | POA: Diagnosis not present

## 2018-11-30 DIAGNOSIS — I6522 Occlusion and stenosis of left carotid artery: Secondary | ICD-10-CM | POA: Diagnosis not present

## 2018-11-30 DIAGNOSIS — R1319 Other dysphagia: Secondary | ICD-10-CM | POA: Diagnosis not present

## 2018-11-30 DIAGNOSIS — K219 Gastro-esophageal reflux disease without esophagitis: Secondary | ICD-10-CM | POA: Diagnosis not present

## 2018-12-01 DIAGNOSIS — E1151 Type 2 diabetes mellitus with diabetic peripheral angiopathy without gangrene: Secondary | ICD-10-CM | POA: Diagnosis not present

## 2018-12-01 DIAGNOSIS — I70213 Atherosclerosis of native arteries of extremities with intermittent claudication, bilateral legs: Secondary | ICD-10-CM | POA: Diagnosis not present

## 2018-12-01 DIAGNOSIS — J449 Chronic obstructive pulmonary disease, unspecified: Secondary | ICD-10-CM | POA: Diagnosis not present

## 2018-12-01 DIAGNOSIS — I2510 Atherosclerotic heart disease of native coronary artery without angina pectoris: Secondary | ICD-10-CM | POA: Diagnosis not present

## 2018-12-01 DIAGNOSIS — R1310 Dysphagia, unspecified: Secondary | ICD-10-CM | POA: Diagnosis not present

## 2018-12-01 DIAGNOSIS — H53462 Homonymous bilateral field defects, left side: Secondary | ICD-10-CM | POA: Diagnosis not present

## 2018-12-05 DIAGNOSIS — I70213 Atherosclerosis of native arteries of extremities with intermittent claudication, bilateral legs: Secondary | ICD-10-CM | POA: Diagnosis not present

## 2018-12-05 DIAGNOSIS — H53462 Homonymous bilateral field defects, left side: Secondary | ICD-10-CM | POA: Diagnosis not present

## 2018-12-05 DIAGNOSIS — E1151 Type 2 diabetes mellitus with diabetic peripheral angiopathy without gangrene: Secondary | ICD-10-CM | POA: Diagnosis not present

## 2018-12-05 DIAGNOSIS — R1310 Dysphagia, unspecified: Secondary | ICD-10-CM | POA: Diagnosis not present

## 2018-12-05 DIAGNOSIS — I2510 Atherosclerotic heart disease of native coronary artery without angina pectoris: Secondary | ICD-10-CM | POA: Diagnosis not present

## 2018-12-05 DIAGNOSIS — J449 Chronic obstructive pulmonary disease, unspecified: Secondary | ICD-10-CM | POA: Diagnosis not present

## 2018-12-06 DIAGNOSIS — R531 Weakness: Secondary | ICD-10-CM | POA: Diagnosis not present

## 2018-12-06 DIAGNOSIS — R1310 Dysphagia, unspecified: Secondary | ICD-10-CM | POA: Diagnosis not present

## 2018-12-06 DIAGNOSIS — I2510 Atherosclerotic heart disease of native coronary artery without angina pectoris: Secondary | ICD-10-CM | POA: Diagnosis not present

## 2018-12-06 DIAGNOSIS — E1151 Type 2 diabetes mellitus with diabetic peripheral angiopathy without gangrene: Secondary | ICD-10-CM | POA: Diagnosis not present

## 2018-12-06 DIAGNOSIS — R202 Paresthesia of skin: Secondary | ICD-10-CM | POA: Diagnosis not present

## 2018-12-06 DIAGNOSIS — I10 Essential (primary) hypertension: Secondary | ICD-10-CM | POA: Diagnosis not present

## 2018-12-06 DIAGNOSIS — F17210 Nicotine dependence, cigarettes, uncomplicated: Secondary | ICD-10-CM | POA: Diagnosis not present

## 2018-12-06 DIAGNOSIS — R471 Dysarthria and anarthria: Secondary | ICD-10-CM | POA: Diagnosis not present

## 2018-12-06 DIAGNOSIS — H53462 Homonymous bilateral field defects, left side: Secondary | ICD-10-CM | POA: Diagnosis not present

## 2018-12-06 DIAGNOSIS — I70213 Atherosclerosis of native arteries of extremities with intermittent claudication, bilateral legs: Secondary | ICD-10-CM | POA: Diagnosis not present

## 2018-12-06 DIAGNOSIS — J449 Chronic obstructive pulmonary disease, unspecified: Secondary | ICD-10-CM | POA: Diagnosis not present

## 2018-12-06 DIAGNOSIS — E1142 Type 2 diabetes mellitus with diabetic polyneuropathy: Secondary | ICD-10-CM | POA: Diagnosis not present

## 2018-12-07 DIAGNOSIS — I70213 Atherosclerosis of native arteries of extremities with intermittent claudication, bilateral legs: Secondary | ICD-10-CM | POA: Diagnosis not present

## 2018-12-07 DIAGNOSIS — E1151 Type 2 diabetes mellitus with diabetic peripheral angiopathy without gangrene: Secondary | ICD-10-CM | POA: Diagnosis not present

## 2018-12-07 DIAGNOSIS — J449 Chronic obstructive pulmonary disease, unspecified: Secondary | ICD-10-CM | POA: Diagnosis not present

## 2018-12-07 DIAGNOSIS — R1310 Dysphagia, unspecified: Secondary | ICD-10-CM | POA: Diagnosis not present

## 2018-12-07 DIAGNOSIS — I2510 Atherosclerotic heart disease of native coronary artery without angina pectoris: Secondary | ICD-10-CM | POA: Diagnosis not present

## 2018-12-07 DIAGNOSIS — H53462 Homonymous bilateral field defects, left side: Secondary | ICD-10-CM | POA: Diagnosis not present

## 2018-12-09 DIAGNOSIS — I70213 Atherosclerosis of native arteries of extremities with intermittent claudication, bilateral legs: Secondary | ICD-10-CM | POA: Diagnosis not present

## 2018-12-09 DIAGNOSIS — I2510 Atherosclerotic heart disease of native coronary artery without angina pectoris: Secondary | ICD-10-CM | POA: Diagnosis not present

## 2018-12-09 DIAGNOSIS — R1310 Dysphagia, unspecified: Secondary | ICD-10-CM | POA: Diagnosis not present

## 2018-12-09 DIAGNOSIS — E1151 Type 2 diabetes mellitus with diabetic peripheral angiopathy without gangrene: Secondary | ICD-10-CM | POA: Diagnosis not present

## 2018-12-09 DIAGNOSIS — H53462 Homonymous bilateral field defects, left side: Secondary | ICD-10-CM | POA: Diagnosis not present

## 2018-12-09 DIAGNOSIS — J449 Chronic obstructive pulmonary disease, unspecified: Secondary | ICD-10-CM | POA: Diagnosis not present

## 2018-12-12 DIAGNOSIS — J449 Chronic obstructive pulmonary disease, unspecified: Secondary | ICD-10-CM | POA: Diagnosis not present

## 2018-12-12 DIAGNOSIS — H53462 Homonymous bilateral field defects, left side: Secondary | ICD-10-CM | POA: Diagnosis not present

## 2018-12-12 DIAGNOSIS — I70213 Atherosclerosis of native arteries of extremities with intermittent claudication, bilateral legs: Secondary | ICD-10-CM | POA: Diagnosis not present

## 2018-12-12 DIAGNOSIS — R1310 Dysphagia, unspecified: Secondary | ICD-10-CM | POA: Diagnosis not present

## 2018-12-12 DIAGNOSIS — I2510 Atherosclerotic heart disease of native coronary artery without angina pectoris: Secondary | ICD-10-CM | POA: Diagnosis not present

## 2018-12-12 DIAGNOSIS — E1151 Type 2 diabetes mellitus with diabetic peripheral angiopathy without gangrene: Secondary | ICD-10-CM | POA: Diagnosis not present

## 2018-12-13 DIAGNOSIS — E1151 Type 2 diabetes mellitus with diabetic peripheral angiopathy without gangrene: Secondary | ICD-10-CM | POA: Diagnosis not present

## 2018-12-13 DIAGNOSIS — I70213 Atherosclerosis of native arteries of extremities with intermittent claudication, bilateral legs: Secondary | ICD-10-CM | POA: Diagnosis not present

## 2018-12-13 DIAGNOSIS — I2510 Atherosclerotic heart disease of native coronary artery without angina pectoris: Secondary | ICD-10-CM | POA: Diagnosis not present

## 2018-12-13 DIAGNOSIS — J449 Chronic obstructive pulmonary disease, unspecified: Secondary | ICD-10-CM | POA: Diagnosis not present

## 2018-12-13 DIAGNOSIS — R1310 Dysphagia, unspecified: Secondary | ICD-10-CM | POA: Diagnosis not present

## 2018-12-13 DIAGNOSIS — H53462 Homonymous bilateral field defects, left side: Secondary | ICD-10-CM | POA: Diagnosis not present

## 2018-12-14 DIAGNOSIS — E1151 Type 2 diabetes mellitus with diabetic peripheral angiopathy without gangrene: Secondary | ICD-10-CM | POA: Diagnosis not present

## 2018-12-14 DIAGNOSIS — J449 Chronic obstructive pulmonary disease, unspecified: Secondary | ICD-10-CM | POA: Diagnosis not present

## 2018-12-14 DIAGNOSIS — I2510 Atherosclerotic heart disease of native coronary artery without angina pectoris: Secondary | ICD-10-CM | POA: Diagnosis not present

## 2018-12-14 DIAGNOSIS — R1310 Dysphagia, unspecified: Secondary | ICD-10-CM | POA: Diagnosis not present

## 2018-12-14 DIAGNOSIS — H53462 Homonymous bilateral field defects, left side: Secondary | ICD-10-CM | POA: Diagnosis not present

## 2018-12-14 DIAGNOSIS — I70213 Atherosclerosis of native arteries of extremities with intermittent claudication, bilateral legs: Secondary | ICD-10-CM | POA: Diagnosis not present

## 2018-12-14 DIAGNOSIS — I10 Essential (primary) hypertension: Secondary | ICD-10-CM | POA: Diagnosis not present

## 2018-12-16 DIAGNOSIS — R1310 Dysphagia, unspecified: Secondary | ICD-10-CM | POA: Diagnosis not present

## 2018-12-16 DIAGNOSIS — E1151 Type 2 diabetes mellitus with diabetic peripheral angiopathy without gangrene: Secondary | ICD-10-CM | POA: Diagnosis not present

## 2018-12-16 DIAGNOSIS — J449 Chronic obstructive pulmonary disease, unspecified: Secondary | ICD-10-CM | POA: Diagnosis not present

## 2018-12-16 DIAGNOSIS — I2510 Atherosclerotic heart disease of native coronary artery without angina pectoris: Secondary | ICD-10-CM | POA: Diagnosis not present

## 2018-12-16 DIAGNOSIS — H53462 Homonymous bilateral field defects, left side: Secondary | ICD-10-CM | POA: Diagnosis not present

## 2018-12-16 DIAGNOSIS — I70213 Atherosclerosis of native arteries of extremities with intermittent claudication, bilateral legs: Secondary | ICD-10-CM | POA: Diagnosis not present

## 2018-12-19 DIAGNOSIS — H53462 Homonymous bilateral field defects, left side: Secondary | ICD-10-CM | POA: Diagnosis not present

## 2018-12-19 DIAGNOSIS — R1310 Dysphagia, unspecified: Secondary | ICD-10-CM | POA: Diagnosis not present

## 2018-12-19 DIAGNOSIS — I2510 Atherosclerotic heart disease of native coronary artery without angina pectoris: Secondary | ICD-10-CM | POA: Diagnosis not present

## 2018-12-19 DIAGNOSIS — I70213 Atherosclerosis of native arteries of extremities with intermittent claudication, bilateral legs: Secondary | ICD-10-CM | POA: Diagnosis not present

## 2018-12-19 DIAGNOSIS — J449 Chronic obstructive pulmonary disease, unspecified: Secondary | ICD-10-CM | POA: Diagnosis not present

## 2018-12-19 DIAGNOSIS — E1151 Type 2 diabetes mellitus with diabetic peripheral angiopathy without gangrene: Secondary | ICD-10-CM | POA: Diagnosis not present

## 2018-12-20 DIAGNOSIS — R1310 Dysphagia, unspecified: Secondary | ICD-10-CM | POA: Diagnosis not present

## 2018-12-20 DIAGNOSIS — E1151 Type 2 diabetes mellitus with diabetic peripheral angiopathy without gangrene: Secondary | ICD-10-CM | POA: Diagnosis not present

## 2018-12-20 DIAGNOSIS — H53462 Homonymous bilateral field defects, left side: Secondary | ICD-10-CM | POA: Diagnosis not present

## 2018-12-20 DIAGNOSIS — I70213 Atherosclerosis of native arteries of extremities with intermittent claudication, bilateral legs: Secondary | ICD-10-CM | POA: Diagnosis not present

## 2018-12-20 DIAGNOSIS — I2510 Atherosclerotic heart disease of native coronary artery without angina pectoris: Secondary | ICD-10-CM | POA: Diagnosis not present

## 2018-12-20 DIAGNOSIS — J449 Chronic obstructive pulmonary disease, unspecified: Secondary | ICD-10-CM | POA: Diagnosis not present

## 2018-12-21 DIAGNOSIS — J449 Chronic obstructive pulmonary disease, unspecified: Secondary | ICD-10-CM | POA: Diagnosis not present

## 2018-12-21 DIAGNOSIS — H53462 Homonymous bilateral field defects, left side: Secondary | ICD-10-CM | POA: Diagnosis not present

## 2018-12-21 DIAGNOSIS — I70213 Atherosclerosis of native arteries of extremities with intermittent claudication, bilateral legs: Secondary | ICD-10-CM | POA: Diagnosis not present

## 2018-12-21 DIAGNOSIS — E1151 Type 2 diabetes mellitus with diabetic peripheral angiopathy without gangrene: Secondary | ICD-10-CM | POA: Diagnosis not present

## 2018-12-21 DIAGNOSIS — R1310 Dysphagia, unspecified: Secondary | ICD-10-CM | POA: Diagnosis not present

## 2018-12-21 DIAGNOSIS — I2510 Atherosclerotic heart disease of native coronary artery without angina pectoris: Secondary | ICD-10-CM | POA: Diagnosis not present

## 2018-12-23 ENCOUNTER — Emergency Department (HOSPITAL_COMMUNITY): Payer: Medicare Other

## 2018-12-23 ENCOUNTER — Emergency Department (HOSPITAL_COMMUNITY)
Admission: EM | Admit: 2018-12-23 | Discharge: 2018-12-24 | Disposition: A | Discharge status: Home / Self Care | Payer: Medicare Other | Attending: Emergency Medicine | Admitting: Emergency Medicine

## 2018-12-23 ENCOUNTER — Encounter (HOSPITAL_COMMUNITY): Payer: Self-pay | Admitting: Emergency Medicine

## 2018-12-23 ENCOUNTER — Other Ambulatory Visit: Payer: Self-pay

## 2018-12-23 DIAGNOSIS — E119 Type 2 diabetes mellitus without complications: Secondary | ICD-10-CM | POA: Insufficient documentation

## 2018-12-23 DIAGNOSIS — R519 Headache, unspecified: Secondary | ICD-10-CM

## 2018-12-23 DIAGNOSIS — Z79899 Other long term (current) drug therapy: Secondary | ICD-10-CM | POA: Insufficient documentation

## 2018-12-23 DIAGNOSIS — I2510 Atherosclerotic heart disease of native coronary artery without angina pectoris: Secondary | ICD-10-CM | POA: Insufficient documentation

## 2018-12-23 DIAGNOSIS — R1310 Dysphagia, unspecified: Secondary | ICD-10-CM | POA: Diagnosis not present

## 2018-12-23 DIAGNOSIS — R531 Weakness: Secondary | ICD-10-CM | POA: Insufficient documentation

## 2018-12-23 DIAGNOSIS — I10 Essential (primary) hypertension: Secondary | ICD-10-CM

## 2018-12-23 DIAGNOSIS — F17210 Nicotine dependence, cigarettes, uncomplicated: Secondary | ICD-10-CM | POA: Insufficient documentation

## 2018-12-23 DIAGNOSIS — R52 Pain, unspecified: Secondary | ICD-10-CM | POA: Diagnosis not present

## 2018-12-23 DIAGNOSIS — R29818 Other symptoms and signs involving the nervous system: Secondary | ICD-10-CM | POA: Diagnosis not present

## 2018-12-23 DIAGNOSIS — Z7982 Long term (current) use of aspirin: Secondary | ICD-10-CM | POA: Diagnosis not present

## 2018-12-23 DIAGNOSIS — J449 Chronic obstructive pulmonary disease, unspecified: Secondary | ICD-10-CM | POA: Diagnosis not present

## 2018-12-23 DIAGNOSIS — R404 Transient alteration of awareness: Secondary | ICD-10-CM | POA: Diagnosis not present

## 2018-12-23 DIAGNOSIS — G44209 Tension-type headache, unspecified, not intractable: Secondary | ICD-10-CM | POA: Insufficient documentation

## 2018-12-23 DIAGNOSIS — H53462 Homonymous bilateral field defects, left side: Secondary | ICD-10-CM | POA: Diagnosis not present

## 2018-12-23 DIAGNOSIS — E1151 Type 2 diabetes mellitus with diabetic peripheral angiopathy without gangrene: Secondary | ICD-10-CM | POA: Diagnosis not present

## 2018-12-23 DIAGNOSIS — R51 Headache: Secondary | ICD-10-CM | POA: Diagnosis not present

## 2018-12-23 DIAGNOSIS — I70213 Atherosclerosis of native arteries of extremities with intermittent claudication, bilateral legs: Secondary | ICD-10-CM | POA: Diagnosis not present

## 2018-12-23 DIAGNOSIS — R0902 Hypoxemia: Secondary | ICD-10-CM | POA: Diagnosis not present

## 2018-12-23 LAB — URINALYSIS, ROUTINE W REFLEX MICROSCOPIC
Bilirubin Urine: NEGATIVE
Glucose, UA: NEGATIVE mg/dL
Hgb urine dipstick: NEGATIVE
Ketones, ur: NEGATIVE mg/dL
Leukocytes,Ua: NEGATIVE
Nitrite: NEGATIVE
Protein, ur: NEGATIVE mg/dL
Specific Gravity, Urine: 1.011 (ref 1.005–1.030)
pH: 5 (ref 5.0–8.0)

## 2018-12-23 LAB — CBC
HCT: 41.4 % (ref 36.0–46.0)
Hemoglobin: 13.3 g/dL (ref 12.0–15.0)
MCH: 30.3 pg (ref 26.0–34.0)
MCHC: 32.1 g/dL (ref 30.0–36.0)
MCV: 94.3 fL (ref 80.0–100.0)
Platelets: 226 10*3/uL (ref 150–400)
RBC: 4.39 MIL/uL (ref 3.87–5.11)
RDW: 14.3 % (ref 11.5–15.5)
WBC: 9 10*3/uL (ref 4.0–10.5)
nRBC: 0 % (ref 0.0–0.2)

## 2018-12-23 LAB — RAPID URINE DRUG SCREEN, HOSP PERFORMED
Amphetamines: NOT DETECTED
Barbiturates: NOT DETECTED
Benzodiazepines: NOT DETECTED
Cocaine: NOT DETECTED
Opiates: NOT DETECTED
Tetrahydrocannabinol: NOT DETECTED

## 2018-12-23 LAB — DIFFERENTIAL
Abs Immature Granulocytes: 0.04 10*3/uL (ref 0.00–0.07)
Basophils Absolute: 0.1 10*3/uL (ref 0.0–0.1)
Basophils Relative: 1 %
Eosinophils Absolute: 0.4 10*3/uL (ref 0.0–0.5)
Eosinophils Relative: 4 %
Immature Granulocytes: 0 %
Lymphocytes Relative: 34 %
Lymphs Abs: 3 10*3/uL (ref 0.7–4.0)
Monocytes Absolute: 0.4 10*3/uL (ref 0.1–1.0)
Monocytes Relative: 4 %
Neutro Abs: 5.1 10*3/uL (ref 1.7–7.7)
Neutrophils Relative %: 57 %

## 2018-12-23 LAB — COMPREHENSIVE METABOLIC PANEL
ALT: 13 U/L (ref 0–44)
AST: 18 U/L (ref 15–41)
Albumin: 3.7 g/dL (ref 3.5–5.0)
Alkaline Phosphatase: 44 U/L (ref 38–126)
Anion gap: 16 — ABNORMAL HIGH (ref 5–15)
BUN: 36 mg/dL — ABNORMAL HIGH (ref 8–23)
CO2: 17 mmol/L — ABNORMAL LOW (ref 22–32)
Calcium: 9.4 mg/dL (ref 8.9–10.3)
Chloride: 106 mmol/L (ref 98–111)
Creatinine, Ser: 1.22 mg/dL — ABNORMAL HIGH (ref 0.44–1.00)
GFR calc Af Amer: 53 mL/min — ABNORMAL LOW (ref >60)
GFR calc non Af Amer: 45 mL/min — ABNORMAL LOW (ref >60)
Glucose, Bld: 175 mg/dL — ABNORMAL HIGH (ref 70–99)
Potassium: 3.4 mmol/L — ABNORMAL LOW (ref 3.5–5.1)
Sodium: 139 mmol/L (ref 135–145)
Total Bilirubin: 0.5 mg/dL (ref 0.3–1.2)
Total Protein: 6.3 g/dL — ABNORMAL LOW (ref 6.5–8.1)

## 2018-12-23 LAB — PROTIME-INR
INR: 1.1 (ref 0.8–1.2)
Prothrombin Time: 13.7 seconds (ref 11.4–15.2)

## 2018-12-23 LAB — CBG MONITORING, ED: Glucose-Capillary: 167 mg/dL — ABNORMAL HIGH (ref 70–99)

## 2018-12-23 LAB — I-STAT CHEM 8, ED
BUN: 35 mg/dL — ABNORMAL HIGH (ref 8–23)
Calcium, Ion: 1.06 mmol/L — ABNORMAL LOW (ref 1.15–1.40)
Chloride: 107 mmol/L (ref 98–111)
Creatinine, Ser: 1.3 mg/dL — ABNORMAL HIGH (ref 0.44–1.00)
Glucose, Bld: 179 mg/dL — ABNORMAL HIGH (ref 70–99)
HCT: 40 % (ref 36.0–46.0)
Hemoglobin: 13.6 g/dL (ref 12.0–15.0)
Potassium: 3.2 mmol/L — ABNORMAL LOW (ref 3.5–5.1)
Sodium: 138 mmol/L (ref 135–145)
TCO2: 22 mmol/L (ref 22–32)

## 2018-12-23 LAB — APTT: aPTT: 29 seconds (ref 24–36)

## 2018-12-23 LAB — TROPONIN I: Troponin I: <0.03 ng/mL (ref <0.03)

## 2018-12-23 LAB — ETHANOL: Alcohol, Ethyl (B): <10 mg/dL (ref <10)

## 2018-12-23 MED ORDER — LORAZEPAM 1 MG PO TABS
0.5000 mg | ORAL_TABLET | Freq: Once | ORAL | Status: AC
  Administered 2018-12-23: 0.5 mg via ORAL
  Filled 2018-12-23: qty 1

## 2018-12-23 NOTE — ED Triage Notes (Signed)
Pt to ED via [facility] with a Code Stroke.  Family reports pt started having left side weakness and aphasia at 19:55.  On arrival to ED pt was alert and oriented x's 3, able to answer questions.

## 2018-12-23 NOTE — ED Notes (Signed)
Blood collected

## 2018-12-23 NOTE — ED Notes (Signed)
Pure Wick placed on pt.  Pt remains alert and oriented x's 3

## 2018-12-23 NOTE — Code Documentation (Signed)
Responded to code stroke called at 2054 for L sided weakness and headache.  Pt arrived at 2129 with improved symptoms.  [id number], NIH-4 (L droop, sensory, and slurred speech), CBG-179. CT head negative.  TPA not administered d/t mild symptoms. Pt will be outside window for TPA at 2325.

## 2018-12-23 NOTE — ED Notes (Signed)
CBG 167 

## 2018-12-23 NOTE — ED Provider Notes (Signed)
[place] EMERGENCY DEPARTMENT Provider Note   CSN: [id number] Arrival date & time: 12/23/18  2129  An emergency department physician performed an initial assessment on this suspected stroke patient at 2130.  History   Chief Complaint Chief Complaint  Patient presents with  . Code Stroke    HPI Lindsay Brooks is a 68 y.o. female. W/ a pmhx of CAD, HTN who presents as a code stroke. Per EMS family endorsed patient was experiencing left sided weakness with confusion. Patient endorses feeling weak and states her family told her to "stick out her tongue and the next thing I knew I was in the ambulance". Currently asymptomatic.      Illness  Severity:  Moderate Onset quality:  Sudden Timing:  Constant Progression:  Unchanged Chronicity:  New Associated symptoms: no abdominal pain, no chest pain, no cough, no ear pain, no fever, no rash, no shortness of breath, no sore throat and no vomiting     Past Medical History:  Diagnosis Date  . CAD (coronary artery disease)    a. s/p 2 CoStar DES to RCA 06/2004. b.  LHC 12/09:  EF 65%, oCFX 40%, oRCA 30%, then 30-40% leading into stented seg that is patent, (10-20% ISR), 40% beyond stent,;  c. EF 81%, poor exercise capacity, hypotensive BP response, + chest pain, normal nuclear images (overall low risk)  . Colon polyps    a. s/p removal 01/2012 - was told one might be cancerous but was not diagnosed with colon cancer - plan is for f/u colonoscopy in 5 yrs.  Marland Kitchen GERD (gastroesophageal reflux disease)   . Hx of echocardiogram    a. Echo 06/2011: EF 55-60%, Gr 1 diast dysfn, mild MR  . Mini stroke ([place])    History of, in 2005 (before she was diagnosed with CAD).  . Other and unspecified hyperlipidemia   . Other isolated or specific phobias   . Peripheral vascular disease, unspecified ([place])    a. s/p L common femoral & left illiac stenting previously (patent by angio in 2007). - ABI 06/2011 normal. b. Carotid US 0-39% BICA  06/2011 (for f/u 2 yrs);   c.  ABIs 9/13:  normal (1.1 bilaterally)  . Personal history of peptic ulcer disease    Remotely.  . Tobacco use disorder   . Type II or unspecified type diabetes mellitus without mention of complication, not stated as uncontrolled   . Unspecified essential hypertension     Patient Active Problem List   Diagnosis Date Noted  . Dyspnea 05/18/2013  . Solitary pulmonary nodule 05/18/2013  . Chest pain 04/09/2012  . CAD (coronary artery disease)   . TRANSIENT ISCHEMIC ATTACK 08/08/2009  . DIABETES MELLITUS, TYPE II 02/01/2009  . Hyperlipidemia 02/01/2009  . CLAUSTROPHOBIA 02/01/2009  . Smoker 02/01/2009  . HYPERTENSION 02/01/2009  . Coronary atherosclerosis 02/01/2009  . PERIPHERAL VASCULAR DISEASE 02/01/2009  . GASTROESOPHAGEAL REFLUX DISEASE 02/01/2009  . PEPTIC ULCER DISEASE, HX OF 02/01/2009    Past Surgical History:  Procedure Laterality Date  . CARDIAC CATHETERIZATION  2009   patent RCA stent. mild CAD otherwise.   . hysterectomy (other)    . left arm surgery    . LEFT HEART CATHETERIZATION WITH CORONARY ANGIOGRAM N/A 01/16/2014   Procedure: LEFT HEART CATHETERIZATION WITH CORONARY ANGIOGRAM;  Surgeon: Blane Ohara, MD;  Location: [facility] CATH LAB;  Service: Cardiovascular;  Laterality: N/A;  . left leg angiography     stenting of the left common iliac artery  .  left rotator cuff repair    . right coronary artery stent       OB History   No obstetric history on file.      Home Medications    Prior to Admission medications   Medication Sig Start Date End Date Taking? Authorizing Provider  ALPRAZolam Duanne Moron) 0.5 MG tablet Take 1-2 tabs, 30-60 min prior to PET scan. 12/20/14   Tanda Rockers, MD  aspirin EC 81 MG tablet Take 81 mg by mouth daily.    [provider]  atenolol (TENORMIN) 50 MG tablet Take 75 mg by mouth 2 (two) times daily.     [provider]  clopidogrel (PLAVIX) 75 MG tablet Take 75 mg by mouth daily at 12  noon.     [provider]  DULoxetine (CYMBALTA) 60 MG capsule Take 120 mg by mouth at bedtime.     [provider]  esomeprazole (NEXIUM) 40 MG capsule Take 40 mg by mouth 2 (two) times daily.     [provider]  fenofibrate (TRICOR) 145 MG tablet Take 145 mg by mouth daily.    [provider]  fexofenadine (ALLEGRA) 180 MG tablet Take 180 mg by mouth daily.    [provider]  fluticasone (FLONASE) 50 MCG/ACT nasal spray Place 2 sprays into the nose daily.    [provider]  gabapentin (NEURONTIN) 600 MG tablet Take 600 mg by mouth 3 (three) times daily.    [provider]  hydrochlorothiazide 25 MG tablet Take 25 mg by mouth daily.      [provider]  LORazepam (ATIVAN) 0.5 MG tablet Take 0.5 mg by mouth daily as needed for anxiety.     [provider]  nitroGLYCERIN (NITROSTAT) 0.4 MG SL tablet Place 0.4 mg under the tongue every 5 (five) minutes as needed. For chest pain    [provider]  potassium chloride (MICRO-K) 10 MEQ CR capsule Take 10 mEq by mouth daily at 12 noon.     [provider]  QUEtiapine (SEROQUEL) 50 MG tablet Take 50 mg by mouth at bedtime.      [provider]  rosuvastatin (CRESTOR) 40 MG tablet Take 1 tablet (40 mg total) by mouth daily. 12/02/15   [name], MD  sitaGLIPtin-metformin (JANUMET) 50-1000 MG per tablet Take 1 tablet by mouth daily.     [provider]  valsartan (DIOVAN) 320 MG tablet Take 320 mg by mouth daily at 12 noon.     [provider]  Vitamin D, Ergocalciferol, (DRISDOL) 50000 UNITS CAPS capsule Take 50,000 Units by mouth every 7 (seven) days. Takes on Wednesdays    [provider]  zolpidem (AMBIEN) 10 MG tablet Take 10 mg by mouth at bedtime as needed for sleep.     [provider]    Family History Family History  Problem Relation Age of Onset  . Heart attack Mother   . Allergies Mother    . Heart attack Father   . Allergies Father   . Coronary artery disease Brother   . Breast cancer Daughter   . Breast cancer Maternal Aunt     Social History Social History   Tobacco Use  . Smoking status: Current Every Day Smoker    Packs/day: 1.00    Years: 44.00    Pack years: 44.00    Types: Cigarettes  . Smokeless tobacco: Never Used  Substance Use Topics  . Alcohol use: No    Comment:  Recovering alcoholic. Sober for >30 years.  . Drug use: No     Allergies   Celecoxib; Insulins; and Vioxx [rofecoxib]   Review of Systems Review of Systems  Constitutional: Negative for chills and fever.  HENT: Negative for ear pain and sore throat.   Eyes: Negative for pain and visual disturbance.  Respiratory: Negative for cough and shortness of breath.   Cardiovascular: Negative for chest pain and palpitations.  Gastrointestinal: Negative for abdominal pain and vomiting.  Genitourinary: Negative for dysuria and hematuria.  Musculoskeletal: Negative for arthralgias and back pain.  Skin: Negative for color change and rash.  Neurological: Positive for weakness. Negative for seizures and syncope.  Psychiatric/Behavioral: Positive for confusion.  All other systems reviewed and are negative.    Physical Exam Updated Vital Signs BP (!) 149/72   Pulse 73   Temp 97.8 F (36.6 C) (Oral)   Resp (!) 26   Wt 65.9 kg   SpO2 93%   BMI 27.45 kg/m   Physical Exam Vitals signs and nursing note reviewed.  Constitutional:      General: She is not in acute distress.    Appearance: Normal appearance. She is well-developed.  HENT:     Head: Normocephalic and atraumatic.  Eyes:     Conjunctiva/sclera: Conjunctivae normal.  Neck:     Musculoskeletal: Neck supple.  Cardiovascular:     Rate and Rhythm: Normal rate and regular rhythm.     Heart sounds: No murmur.  Pulmonary:     Effort: Pulmonary effort is normal. No respiratory distress.     Breath sounds: Normal breath sounds.   Abdominal:     Palpations: Abdomen is soft.     Tenderness: There is no abdominal tenderness.  Skin:    General: Skin is warm and dry.  Neurological:     General: No focal deficit present.     Mental Status: She is alert and oriented to person, place, and time.     Cranial Nerves: No cranial nerve deficit.     Sensory: No sensory deficit.     Motor: No weakness.     Coordination: Coordination normal.      ED Treatments / Results  Labs (all labs ordered are listed, but only abnormal results are displayed) Labs Reviewed  COMPREHENSIVE METABOLIC PANEL - Abnormal; Notable for the following components:      Result Value   Potassium 3.4 (*)    CO2 17 (*)    Glucose, Bld 175 (*)    BUN 36 (*)    Creatinine, Ser 1.22 (*)    Total Protein 6.3 (*)    GFR calc non Af Amer 45 (*)    GFR calc Af Amer 53 (*)    Anion gap 16 (*)    All other components within normal limits  URINALYSIS, ROUTINE W REFLEX MICROSCOPIC - Abnormal; Notable for the following components:   Color, Urine STRAW (*)    All other components within normal limits  I-STAT CHEM 8, ED - Abnormal; Notable for the following components:   Potassium 3.2 (*)    BUN 35 (*)    Creatinine, Ser 1.30 (*)    Glucose, Bld 179 (*)    Calcium, Ion 1.06 (*)    All other components within normal limits  CBG MONITORING, ED - Abnormal; Notable for the following components:   Glucose-Capillary 167 (*)    All other components within normal limits  ETHANOL  PROTIME-INR  APTT  CBC  DIFFERENTIAL  RAPID  URINE DRUG SCREEN, HOSP PERFORMED  TROPONIN I    EKG EKG Interpretation  Date/Time:  Friday Dec 23 2018 21:53:49 EDT Ventricular Rate:  76 PR Interval:    QRS Duration: 142 QT Interval:  428 QTC Calculation: 482 R Axis:   52 Text Interpretation:  Sinus rhythm Right bundle branch block Inferior infarct, age indeterminate When compared with ECG of 04/09/2012, No significant change was found Confirmed by Delora Fuel ([id number]) on  12/24/2018 11:33:18 AM   Radiology Mr Brain Wo Contrast  Result Date: 12/24/2018 CLINICAL DATA:  Left-sided weakness and headache EXAM: MRI HEAD WITHOUT CONTRAST TECHNIQUE: Multiplanar, multiecho pulse sequences of the brain and surrounding structures were obtained without intravenous contrast. COMPARISON:  Head CT 12/23/2018 Brain MRI 11/27/2014 FINDINGS: BRAIN: There is no acute infarct, acute hemorrhage or extra-axial collection. The midline structures are normal. Old right frontal infarct Multifocal white matter hyperintensity, most commonly due to chronic ischemic microangiopathy. The cerebral and cerebellar volume are age-appropriate. No hydrocephalus. Susceptibility-sensitive sequences show no chronic microhemorrhage or superficial siderosis. No mass lesion. VASCULAR: The major intracranial arterial and venous sinus flow voids are normal. SKULL AND UPPER CERVICAL SPINE: Calvarial bone marrow signal is normal. There is no skull base mass. Visualized upper cervical spine and soft tissues are normal. SINUSES/ORBITS: Right mastoid fluid. Paranasal sinuses are clear. The orbits are normal. IMPRESSION: 1. No acute intracranial abnormality. 2. Chronic small vessel ischemia. Electronically Signed   By: Ulyses Jarred M.D.   On: 12/24/2018 01:26   Ct Head Code Stroke Wo Contrast`  Result Date: 12/23/2018 CLINICAL DATA:  Code stroke. Initial evaluation for acute left-sided weakness. EXAM: CT HEAD WITHOUT CONTRAST TECHNIQUE: Contiguous axial images were obtained from the base of the skull through the vertex without intravenous contrast. COMPARISON:  Prior MRI from 11/27/2014. FINDINGS: Brain: Cerebral volume within normal limits. Mild chronic microvascular ischemic disease. Possible small remote lacunar infarct at the right lentiform nucleus. No acute intracranial hemorrhage. No acute large vessel territory infarct. No mass lesion, midline shift or mass effect. No hydrocephalus. No extra-axial fluid collection.  Vascular: No hyperdense vessel. Skull: Negative. Sinuses/Orbits: Globes and orbital soft tissues within normal limits. Paranasal sinuses and mastoids are clear. Other: None. ASPECTS [facility] Stroke Program Early CT Score) - Ganglionic level infarction (caudate, lentiform nuclei, internal capsule, insula, M1-M3 cortex): 7 - Supraganglionic infarction (M4-M6 cortex): 3 Total score (0-10 with 10 being normal): 10 IMPRESSION: 1. No acute intracranial infarct or other abnormality. 2. ASPECTS is 10. 3. Mild chronic microvascular ischemic disease. These results were communicated to Dr. Lorraine Lax at 9:57 pmon 5/15/2020by text page via the [facility] messaging system. Electronically Signed   By: Jeannine Boga M.D.   On: 12/23/2018 21:58    Procedures Procedures (including critical care time)  Medications Ordered in ED Medications  LORazepam (ATIVAN) tablet 0.5 mg (0.5 mg Oral Given 12/23/18 2338)     Initial Impression / Assessment and Plan / ED Course  I have reviewed the triage vital signs and the nursing notes.  Pertinent labs & imaging results that were available during my care of the patient were reviewed by me and considered in my medical decision making (see chart for details).        Lindsay Brooks is a 68 y.o. female who presents to the ED who presents to the ED as a CODE STROKE for neurologic deficits, including left sided weakness. Symptoms started at 830pm. Patient met in the hallway, evaluated by myself and by the stroke team MD.. Patient  to the CT scanner urgently.   At the time of evaluation after CT scan patient with resolution of symptoms. NIH stroke score 0.  CT scan demonstrates no acute abnormalities..   At the time of handoff patient is pending an MRI. Once resulted will follow up with neuro recs.  Patient handed off at 0000 to oncoming team.    Final Clinical Impressions(s) / ED Diagnoses   Final diagnoses:  Weakness  Nonintractable headache, unspecified chronicity  pattern, unspecified headache type  Hypertension not at goal    ED Discharge Orders    None       Doneta Public, MD 12/24/18 [place], Kevin, MD 12/24/18 [phone]

## 2018-12-23 NOTE — ED Notes (Signed)
Daughter, Gabriel [phone]

## 2018-12-24 DIAGNOSIS — R51 Headache: Secondary | ICD-10-CM | POA: Diagnosis not present

## 2018-12-24 DIAGNOSIS — R29818 Other symptoms and signs involving the nervous system: Secondary | ICD-10-CM | POA: Diagnosis not present

## 2018-12-24 DIAGNOSIS — R531 Weakness: Secondary | ICD-10-CM | POA: Diagnosis not present

## 2018-12-24 NOTE — Discharge Instructions (Signed)
Your work-up in the emergency department was reassuring.  We advise follow-up with your primary care doctor for recheck.  You may return to the ED for new or concerning symptoms.

## 2018-12-24 NOTE — Consult Note (Signed)
Requesting Physician: Dr. Dolly Rias    Chief Complaint: Left-sided weakness, nonverbal, HEADACHE  History obtained from: Patient and Chart    HPI:                                                                                                                                       Lindsay Brooks is an 68 y.o. female with past medical history of Coronary artery disease, type 2 diabetes mellitus peripheral neuropathy, COPD, hyperlipidemia, peripheral vascular disease with recent admission in April 2020 at [facility] after presenting with strokelike symptoms in April 2020.  Per EMS patient was herself until around 8:30 PM developed a headache and then had left-sided weakness.  Patient CNA called EMS and on arrival patient was not speaking, has significant left-sided weakness.  She was brought to [facility], ER as a stroke alert.   On arrival, was improving and no longer had left upper extremity drift and was answering questions appropriately.  Pressure on arrival was 173 /87 mmHg.  Stat CT head was obtained which showed no acute findings.  NIH stroke scale was 4 and therefore TPA was not administered as symptoms were mild and nondisabling as well as she was continuing to improve.   Spoke to patient's eldest daughter over the phone- She was prescribed a new medication, complained of headache.  Daughter checked her BP was 196/93 Home health nurse was called and recommend Valsartan.  Daughter also called EMS who noticed patient had left-sided weakness and was not speaking.   Stroke work-up at [facility] 4/13 -4/15 Upon reviewing her records from her admission, she presented there with similar symptoms of slurred speech and left-sided weakness for 3 days.  She underwent extensive stroke work-up including CT angiogram was negative for stenosis as well as MRI brain was negative for stroke.  Echo was performed which had normal LV function.  There impression was patient was taking  gabapentin excessively and they decreased the dose.  Patient also was on aspirin and Plavix which was continued as well as statin.     Date last known well: 5.16.20 Time last known well: 8.30 pm? tPA Given: mild and resolving symptoms NIHSS: 4 Baseline MRS 0     Past Medical History:  Diagnosis Date  . CAD (coronary artery disease)    a. s/p 2 CoStar DES to RCA 06/2004. b.  LHC 12/09:  EF 65%, oCFX 40%, oRCA 30%, then 30-40% leading into stented seg that is patent, (10-20% ISR), 40% beyond stent,;  c. EF 81%, poor exercise capacity, hypotensive BP response, + chest pain, normal nuclear images (overall low risk)  . Colon polyps    a. s/p removal 01/2012 - was told one might be cancerous but was not diagnosed with colon cancer - plan is for f/u colonoscopy in 5 yrs.  Marland Kitchen GERD (gastroesophageal reflux disease)   . Hx of echocardiogram  a. Echo 06/2011: EF 55-60%, Gr 1 diast dysfn, mild MR  . Mini stroke ([place])    History of, in 2005 (before she was diagnosed with CAD).  . Other and unspecified hyperlipidemia   . Other isolated or specific phobias   . Peripheral vascular disease, unspecified ([place])    a. s/p L common femoral & left illiac stenting previously (patent by angio in 2007). - ABI 06/2011 normal. b. Carotid US 0-39% BICA 06/2011 (for f/u 2 yrs);   c.  ABIs 9/13:  normal (1.1 bilaterally)  . Personal history of peptic ulcer disease    Remotely.  . Tobacco use disorder   . Type II or unspecified type diabetes mellitus without mention of complication, not stated as uncontrolled   . Unspecified essential hypertension     Past Surgical History:  Procedure Laterality Date  . CARDIAC CATHETERIZATION  2009   patent RCA stent. mild CAD otherwise.   . hysterectomy (other)    . left arm surgery    . LEFT HEART CATHETERIZATION WITH CORONARY ANGIOGRAM N/A 01/16/2014   Procedure: LEFT HEART CATHETERIZATION WITH CORONARY ANGIOGRAM;  Surgeon: Blane Ohara, MD;  Location: [facility] CATH LAB;   Service: Cardiovascular;  Laterality: N/A;  . left leg angiography     stenting of the left common iliac artery  . left rotator cuff repair    . right coronary artery stent      Family History  Problem Relation Age of Onset  . Heart attack Mother   . Allergies Mother   . Heart attack Father   . Allergies Father   . Coronary artery disease Brother   . Breast cancer Daughter   . Breast cancer Maternal Aunt    Social History:  reports that she has been smoking cigarettes. She has a 44.00 pack-year smoking history. She has never used smokeless tobacco. She reports that she does not drink alcohol or use drugs.  Allergies:  Allergies  Allergen Reactions  . Celecoxib Hives  . Insulins Nausea And Vomiting    Patient unsure of name of insulin, will call us back  . Vioxx [Rofecoxib] Hives    Medications:                                                                                                                        I reviewed home medications  ROS:  14 systems reviewed and negative except above    Examination:                                                                                                      General: Appears well-developed Psych: Affect appropriate to situation Eyes: No scleral injection HENT: No OP obstrucion Head: Normocephalic.  Cardiovascular: Normal rate and regular rhythm.  Respiratory: Effort normal and breath sounds normal to anterior ascultation GI: Soft.  No distension. There is no tenderness.  Skin: WDI    Neurological Examination Mental Status: Alert, oriented, thought content appropriate.  Speech fluent without evidence of aphasia. Able to follow 3 step commands without difficulty. Cranial Nerves: II: Visual fields grossly normal,  III,IV, VI: ptosis not present, extra-ocular motions intact bilaterally,  pupils equal, round, reactive to light and accommodation V,VII: Mild left facial droop, reduced facial sensation to light touch VIII: hearing normal bilaterally IX,X: uvula rises symmetrically XI: bilateral shoulder shrug XII: midline tongue extension Motor: Right : Upper extremity   5/5    Left:     Upper extremity   5/5  Lower extremity   5/5     Lower extremity   4+/5 Tone and bulk:normal tone throughout; no atrophy noted Sensory: Sensation to light touch over left arm and leg,  Deep Tendon Reflexes: 2+ and symmetric throughout Plantars: Right: downgoing   Left: downgoing Cerebellar: normal finger-to-nose bilaterally Gait: not assessed at time of code stroke     Lab Results: Basic Metabolic Panel: Recent Labs  Lab 12/23/18 2139  NA 139  138  K 3.4*  3.2*  CL 106  107  CO2 17*  GLUCOSE 175*  179*  BUN 36*  35*  CREATININE 1.22*  1.30*  CALCIUM 9.4    CBC: Recent Labs  Lab 12/23/18 2139  WBC 9.0  NEUTROABS 5.1  HGB 13.3  13.6  HCT 41.4  40.0  MCV 94.3  PLT 226    Coagulation Studies: Recent Labs    12/23/18 2139  LABPROT 13.7  INR 1.1    Imaging: Ct Head Code Stroke Wo Contrast`  Result Date: 12/23/2018 CLINICAL DATA:  Code stroke. Initial evaluation for acute left-sided weakness. EXAM: CT HEAD WITHOUT CONTRAST TECHNIQUE: Contiguous axial images were obtained from the base of the skull through the vertex without intravenous contrast. COMPARISON:  Prior MRI from 11/27/2014. FINDINGS: Brain: Cerebral volume within normal limits. Mild chronic microvascular ischemic disease. Possible small remote lacunar infarct at the right lentiform nucleus. No acute intracranial hemorrhage. No acute large vessel territory infarct. No mass lesion, midline shift or mass effect. No hydrocephalus. No extra-axial fluid collection. Vascular: No hyperdense vessel. Skull: Negative. Sinuses/Orbits: Globes and orbital soft tissues within normal limits. Paranasal sinuses and  mastoids are clear. Other: None. ASPECTS [facility] Stroke Program Early CT Score) - Ganglionic level infarction (caudate, lentiform nuclei, internal capsule, insula, M1-M3 cortex): 7 - Supraganglionic infarction (M4-M6 cortex): 3 Total score (0-10 with 10 being normal): 10 IMPRESSION: 1. No acute intracranial infarct or other abnormality. 2. ASPECTS is 10. 3. Mild chronic microvascular ischemic disease. These  results were communicated to Dr. Lorraine Lax at 9:57 pmon 5/15/2020by text page via the [facility] messaging system. Electronically Signed   By: Jeannine Boga M.D.   On: 12/23/2018 21:58     ASSESSMENT AND PLAN   Hypertensive emergency VS medication adverse effect/polypharmacy, less likely TIA  MRI brain negative for acute stroke Patient has had TIA work-up at [facility] less then 1 month ago, no need to repeat workup.  Treat hypertension Neurochecks Gait evaluation Continue dual antiplatelet therapy, statin Urine drug screen  IF Patient is back to baseline, can be discharged from ED with outpatient neurology follow up  [place] Pager Number [phone]

## 2018-12-24 NOTE — ED Notes (Signed)
Discharge instructions discussed with daughter and patient

## 2018-12-24 NOTE — ED Notes (Signed)
Pt ambulated with no problems

## 2018-12-24 NOTE — ED Notes (Signed)
Called ptar for transport home

## 2018-12-24 NOTE — ED Notes (Signed)
Updated daughter. 

## 2018-12-24 NOTE — ED Provider Notes (Signed)
2:10 AM Case discussed with Dr. Lorraine Lax who has reviewed the patient's MRI. Imaging today is negative. Hx of recent stroke work up in April. BP has been stable, mildly hypertensive but overall reassuring. The patient is able to ambulate without issue. Is presently at her baseline. Plan for outpatient PCP follow up. Return precautions discussed and provided. Patient discharged in stable condition with no unaddressed concerns.   Results for orders placed or performed during the hospital encounter of 12/23/18  Ethanol  Result Value Ref Range   Alcohol, Ethyl (B) <10 <10 mg/dL  Protime-INR  Result Value Ref Range   Prothrombin Time 13.7 11.4 - 15.2 seconds   INR 1.1 0.8 - 1.2  APTT  Result Value Ref Range   aPTT 29 24 - 36 seconds  CBC  Result Value Ref Range   WBC 9.0 4.0 - 10.5 K/uL   RBC 4.39 3.87 - 5.11 MIL/uL   Hemoglobin 13.3 12.0 - 15.0 g/dL   HCT 41.4 36.0 - 46.0 %   MCV 94.3 80.0 - 100.0 fL   MCH 30.3 26.0 - 34.0 pg   MCHC 32.1 30.0 - 36.0 g/dL   RDW 14.3 11.5 - 15.5 %   Platelets 226 150 - 400 K/uL   nRBC 0.0 0.0 - 0.2 %  Differential  Result Value Ref Range   Neutrophils Relative % 57 %   Neutro Abs 5.1 1.7 - 7.7 K/uL   Lymphocytes Relative 34 %   Lymphs Abs 3.0 0.7 - 4.0 K/uL   Monocytes Relative 4 %   Monocytes Absolute 0.4 0.1 - 1.0 K/uL   Eosinophils Relative 4 %   Eosinophils Absolute 0.4 0.0 - 0.5 K/uL   Basophils Relative 1 %   Basophils Absolute 0.1 0.0 - 0.1 K/uL   Immature Granulocytes 0 %   Abs Immature Granulocytes 0.04 0.00 - 0.07 K/uL  Comprehensive metabolic panel  Result Value Ref Range   Sodium 139 135 - 145 mmol/L   Potassium 3.4 (L) 3.5 - 5.1 mmol/L   Chloride 106 98 - 111 mmol/L   CO2 17 (L) 22 - 32 mmol/L   Glucose, Bld 175 (H) 70 - 99 mg/dL   BUN 36 (H) 8 - 23 mg/dL   Creatinine, Ser 1.22 (H) 0.44 - 1.00 mg/dL   Calcium 9.4 8.9 - 10.3 mg/dL   Total Protein 6.3 (L) 6.5 - 8.1 g/dL   Albumin 3.7 3.5 - 5.0 g/dL   AST 18 15 - 41 U/L   ALT 13  0 - 44 U/L   Alkaline Phosphatase 44 38 - 126 U/L   Total Bilirubin 0.5 0.3 - 1.2 mg/dL   GFR calc non Af Amer 45 (L) >60 mL/min   GFR calc Af Amer 53 (L) >60 mL/min   Anion gap 16 (H) 5 - 15  Urine rapid drug screen (hosp performed)  Result Value Ref Range   Opiates NONE DETECTED NONE DETECTED   Cocaine NONE DETECTED NONE DETECTED   Benzodiazepines NONE DETECTED NONE DETECTED   Amphetamines NONE DETECTED NONE DETECTED   Tetrahydrocannabinol NONE DETECTED NONE DETECTED   Barbiturates NONE DETECTED NONE DETECTED  Urinalysis, Routine w reflex microscopic  Result Value Ref Range   Color, Urine STRAW (A) YELLOW   APPearance CLEAR CLEAR   Specific Gravity, Urine 1.011 1.005 - 1.030   pH 5.0 5.0 - 8.0   Glucose, UA NEGATIVE NEGATIVE mg/dL   Hgb urine dipstick NEGATIVE NEGATIVE   Bilirubin Urine NEGATIVE NEGATIVE   Ketones, ur  NEGATIVE NEGATIVE mg/dL   Protein, ur NEGATIVE NEGATIVE mg/dL   Nitrite NEGATIVE NEGATIVE   Leukocytes,Ua NEGATIVE NEGATIVE  Troponin I - ONCE - STAT  Result Value Ref Range   Troponin I <0.03 <0.03 ng/mL  I-stat chem 8, ED ([facility] and [facility] only)  Result Value Ref Range   Sodium 138 135 - 145 mmol/L   Potassium 3.2 (L) 3.5 - 5.1 mmol/L   Chloride 107 98 - 111 mmol/L   BUN 35 (H) 8 - 23 mg/dL   Creatinine, Ser 1.30 (H) 0.44 - 1.00 mg/dL   Glucose, Bld 179 (H) 70 - 99 mg/dL   Calcium, Ion 1.06 (L) 1.15 - 1.40 mmol/L   TCO2 22 22 - 32 mmol/L   Hemoglobin 13.6 12.0 - 15.0 g/dL   HCT 40.0 36.0 - 46.0 %  CBG monitoring, ED  Result Value Ref Range   Glucose-Capillary 167 (H) 70 - 99 mg/dL   Mr Brain Wo Contrast  Result Date: 12/24/2018 CLINICAL DATA:  Left-sided weakness and headache EXAM: MRI HEAD WITHOUT CONTRAST TECHNIQUE: Multiplanar, multiecho pulse sequences of the brain and surrounding structures were obtained without intravenous contrast. COMPARISON:  Head CT 12/23/2018 Brain MRI 11/27/2014 FINDINGS: BRAIN: There is no acute infarct, acute hemorrhage or  extra-axial collection. The midline structures are normal. Old right frontal infarct Multifocal white matter hyperintensity, most commonly due to chronic ischemic microangiopathy. The cerebral and cerebellar volume are age-appropriate. No hydrocephalus. Susceptibility-sensitive sequences show no chronic microhemorrhage or superficial siderosis. No mass lesion. VASCULAR: The major intracranial arterial and venous sinus flow voids are normal. SKULL AND UPPER CERVICAL SPINE: Calvarial bone marrow signal is normal. There is no skull base mass. Visualized upper cervical spine and soft tissues are normal. SINUSES/ORBITS: Right mastoid fluid. Paranasal sinuses are clear. The orbits are normal. IMPRESSION: 1. No acute intracranial abnormality. 2. Chronic small vessel ischemia. Electronically Signed   By: Ulyses Jarred M.D.   On: 12/24/2018 01:26   Ct Head Code Stroke Wo Contrast`  Result Date: 12/23/2018 CLINICAL DATA:  Code stroke. Initial evaluation for acute left-sided weakness. EXAM: CT HEAD WITHOUT CONTRAST TECHNIQUE: Contiguous axial images were obtained from the base of the skull through the vertex without intravenous contrast. COMPARISON:  Prior MRI from 11/27/2014. FINDINGS: Brain: Cerebral volume within normal limits. Mild chronic microvascular ischemic disease. Possible small remote lacunar infarct at the right lentiform nucleus. No acute intracranial hemorrhage. No acute large vessel territory infarct. No mass lesion, midline shift or mass effect. No hydrocephalus. No extra-axial fluid collection. Vascular: No hyperdense vessel. Skull: Negative. Sinuses/Orbits: Globes and orbital soft tissues within normal limits. Paranasal sinuses and mastoids are clear. Other: None. ASPECTS [facility] Stroke Program Early CT Score) - Ganglionic level infarction (caudate, lentiform nuclei, internal capsule, insula, M1-M3 cortex): 7 - Supraganglionic infarction (M4-M6 cortex): 3 Total score (0-10 with 10 being normal): 10  IMPRESSION: 1. No acute intracranial infarct or other abnormality. 2. ASPECTS is 10. 3. Mild chronic microvascular ischemic disease. These results were communicated to Dr. Lorraine Lax at 9:57 pmon 5/15/2020by text page via the [facility] messaging system. Electronically Signed   By: Jeannine Boga M.D.   On: 12/23/2018 21:58      Antonietta Breach, PA-C 12/24/18 0308    Mesner, Corene Cornea, MD 12/24/18 [phone]

## 2018-12-25 DIAGNOSIS — F17210 Nicotine dependence, cigarettes, uncomplicated: Secondary | ICD-10-CM | POA: Diagnosis not present

## 2018-12-25 DIAGNOSIS — E1142 Type 2 diabetes mellitus with diabetic polyneuropathy: Secondary | ICD-10-CM | POA: Diagnosis not present

## 2018-12-25 DIAGNOSIS — R202 Paresthesia of skin: Secondary | ICD-10-CM | POA: Diagnosis not present

## 2018-12-25 DIAGNOSIS — E785 Hyperlipidemia, unspecified: Secondary | ICD-10-CM | POA: Diagnosis not present

## 2018-12-25 DIAGNOSIS — I70213 Atherosclerosis of native arteries of extremities with intermittent claudication, bilateral legs: Secondary | ICD-10-CM | POA: Diagnosis not present

## 2018-12-25 DIAGNOSIS — J449 Chronic obstructive pulmonary disease, unspecified: Secondary | ICD-10-CM | POA: Diagnosis not present

## 2018-12-25 DIAGNOSIS — H53462 Homonymous bilateral field defects, left side: Secondary | ICD-10-CM | POA: Diagnosis not present

## 2018-12-25 DIAGNOSIS — R471 Dysarthria and anarthria: Secondary | ICD-10-CM | POA: Diagnosis not present

## 2018-12-25 DIAGNOSIS — I10 Essential (primary) hypertension: Secondary | ICD-10-CM | POA: Diagnosis not present

## 2018-12-25 DIAGNOSIS — R531 Weakness: Secondary | ICD-10-CM | POA: Diagnosis not present

## 2018-12-25 DIAGNOSIS — Z7984 Long term (current) use of oral hypoglycemic drugs: Secondary | ICD-10-CM | POA: Diagnosis not present

## 2018-12-25 DIAGNOSIS — R1310 Dysphagia, unspecified: Secondary | ICD-10-CM | POA: Diagnosis not present

## 2018-12-25 DIAGNOSIS — E1151 Type 2 diabetes mellitus with diabetic peripheral angiopathy without gangrene: Secondary | ICD-10-CM | POA: Diagnosis not present

## 2018-12-25 DIAGNOSIS — I2510 Atherosclerotic heart disease of native coronary artery without angina pectoris: Secondary | ICD-10-CM | POA: Diagnosis not present

## 2018-12-27 DIAGNOSIS — E1151 Type 2 diabetes mellitus with diabetic peripheral angiopathy without gangrene: Secondary | ICD-10-CM | POA: Diagnosis not present

## 2018-12-27 DIAGNOSIS — H53462 Homonymous bilateral field defects, left side: Secondary | ICD-10-CM | POA: Diagnosis not present

## 2018-12-27 DIAGNOSIS — R1310 Dysphagia, unspecified: Secondary | ICD-10-CM | POA: Diagnosis not present

## 2018-12-27 DIAGNOSIS — J449 Chronic obstructive pulmonary disease, unspecified: Secondary | ICD-10-CM | POA: Diagnosis not present

## 2018-12-27 DIAGNOSIS — I70213 Atherosclerosis of native arteries of extremities with intermittent claudication, bilateral legs: Secondary | ICD-10-CM | POA: Diagnosis not present

## 2018-12-27 DIAGNOSIS — I2510 Atherosclerotic heart disease of native coronary artery without angina pectoris: Secondary | ICD-10-CM | POA: Diagnosis not present

## 2018-12-28 DIAGNOSIS — R4701 Aphasia: Secondary | ICD-10-CM | POA: Diagnosis not present

## 2018-12-28 DIAGNOSIS — I6522 Occlusion and stenosis of left carotid artery: Secondary | ICD-10-CM | POA: Diagnosis not present

## 2018-12-28 DIAGNOSIS — I119 Hypertensive heart disease without heart failure: Secondary | ICD-10-CM | POA: Diagnosis not present

## 2018-12-28 DIAGNOSIS — I2510 Atherosclerotic heart disease of native coronary artery without angina pectoris: Secondary | ICD-10-CM | POA: Diagnosis not present

## 2018-12-28 DIAGNOSIS — H53462 Homonymous bilateral field defects, left side: Secondary | ICD-10-CM | POA: Diagnosis not present

## 2018-12-28 DIAGNOSIS — I70213 Atherosclerosis of native arteries of extremities with intermittent claudication, bilateral legs: Secondary | ICD-10-CM | POA: Diagnosis not present

## 2018-12-28 DIAGNOSIS — R1310 Dysphagia, unspecified: Secondary | ICD-10-CM | POA: Diagnosis not present

## 2018-12-28 DIAGNOSIS — J449 Chronic obstructive pulmonary disease, unspecified: Secondary | ICD-10-CM | POA: Diagnosis not present

## 2018-12-28 DIAGNOSIS — E1151 Type 2 diabetes mellitus with diabetic peripheral angiopathy without gangrene: Secondary | ICD-10-CM | POA: Diagnosis not present

## 2018-12-29 ENCOUNTER — Telehealth: Payer: Self-pay | Admitting: Diagnostic Neuroimaging

## 2018-12-29 ENCOUNTER — Encounter: Payer: Self-pay | Admitting: Diagnostic Neuroimaging

## 2018-12-29 DIAGNOSIS — E1151 Type 2 diabetes mellitus with diabetic peripheral angiopathy without gangrene: Secondary | ICD-10-CM | POA: Diagnosis not present

## 2018-12-29 DIAGNOSIS — R1310 Dysphagia, unspecified: Secondary | ICD-10-CM | POA: Diagnosis not present

## 2018-12-29 DIAGNOSIS — I2510 Atherosclerotic heart disease of native coronary artery without angina pectoris: Secondary | ICD-10-CM | POA: Diagnosis not present

## 2018-12-29 DIAGNOSIS — I70213 Atherosclerosis of native arteries of extremities with intermittent claudication, bilateral legs: Secondary | ICD-10-CM | POA: Diagnosis not present

## 2018-12-29 DIAGNOSIS — H53462 Homonymous bilateral field defects, left side: Secondary | ICD-10-CM | POA: Diagnosis not present

## 2018-12-29 DIAGNOSIS — J449 Chronic obstructive pulmonary disease, unspecified: Secondary | ICD-10-CM | POA: Diagnosis not present

## 2018-12-29 NOTE — Telephone Encounter (Signed)
Due to current COVID 19 pandemic, our office is severely reducing in office visits until further notice, in order to minimize the risk to our patients and healthcare providers.  Got in touch with patient today to schedule a virtual visit. She gave consent, and requested I call her daughter Lindsay Brooks [phone]) to set this up as she will be available to help. I spoke with Lindsay Brooks and went over the instructions for doxy and she understood. The email she gave me is Lindsay Brooks. Email was sent containing doxy link, appointment information, and instructions.I also informed them the nurse will be giving them a call to update her chart.  Pt understands that although there may be some limitations with this type of visit, we will take all precautions to reduce any security or privacy concerns.  Pt understands that this will be treated like an in office visit and we will file with pt's insurance, and there may be a patient responsible charge related to this service.

## 2018-12-29 NOTE — Telephone Encounter (Addendum)
Called to update EMR. patient gave phone to [place], daughter and we updated EMR. She will assist with video visit.

## 2018-12-29 NOTE — Addendum Note (Signed)
Addended by: Minna Antis on: 12/29/2018 03:23 PM   Modules accepted: Orders

## 2018-12-30 DIAGNOSIS — F17200 Nicotine dependence, unspecified, uncomplicated: Secondary | ICD-10-CM | POA: Diagnosis not present

## 2018-12-30 DIAGNOSIS — G43909 Migraine, unspecified, not intractable, without status migrainosus: Secondary | ICD-10-CM | POA: Diagnosis not present

## 2018-12-30 DIAGNOSIS — Z0001 Encounter for general adult medical examination with abnormal findings: Secondary | ICD-10-CM | POA: Diagnosis not present

## 2018-12-30 DIAGNOSIS — I10 Essential (primary) hypertension: Secondary | ICD-10-CM | POA: Diagnosis not present

## 2019-01-03 ENCOUNTER — Other Ambulatory Visit: Payer: Self-pay

## 2019-01-03 ENCOUNTER — Ambulatory Visit (INDEPENDENT_AMBULATORY_CARE_PROVIDER_SITE_OTHER): Payer: Medicare Other | Admitting: Diagnostic Neuroimaging

## 2019-01-03 ENCOUNTER — Encounter: Payer: Self-pay | Admitting: Diagnostic Neuroimaging

## 2019-01-03 DIAGNOSIS — J449 Chronic obstructive pulmonary disease, unspecified: Secondary | ICD-10-CM | POA: Diagnosis not present

## 2019-01-03 DIAGNOSIS — I70213 Atherosclerosis of native arteries of extremities with intermittent claudication, bilateral legs: Secondary | ICD-10-CM | POA: Diagnosis not present

## 2019-01-03 DIAGNOSIS — R1310 Dysphagia, unspecified: Secondary | ICD-10-CM | POA: Diagnosis not present

## 2019-01-03 DIAGNOSIS — E1151 Type 2 diabetes mellitus with diabetic peripheral angiopathy without gangrene: Secondary | ICD-10-CM | POA: Diagnosis not present

## 2019-01-03 DIAGNOSIS — I2510 Atherosclerotic heart disease of native coronary artery without angina pectoris: Secondary | ICD-10-CM | POA: Diagnosis not present

## 2019-01-03 DIAGNOSIS — I161 Hypertensive emergency: Secondary | ICD-10-CM

## 2019-01-03 DIAGNOSIS — R4781 Slurred speech: Secondary | ICD-10-CM | POA: Diagnosis not present

## 2019-01-03 DIAGNOSIS — H53462 Homonymous bilateral field defects, left side: Secondary | ICD-10-CM | POA: Diagnosis not present

## 2019-01-03 NOTE — Progress Notes (Signed)
[facility]  PATIENT: Lindsay Brooks DOB: 1951/07/29  REFERRING CLINICIAN: K Cox HISTORY FROM: patient and daughter REASON FOR VISIT: new consult    HISTORICAL  CHIEF COMPLAINT:  No chief complaint on file.   HISTORY OF PRESENT ILLNESS:   68 year old female here for evaluation of slurred speech, numbness, strokelike symptoms.  April 2020 patient had onset of slurred speech and trouble talking, was taken to hospital for stroke evaluation.  CT, CTA, MRI were unremarkable.  Patient was diagnosed with possible side effect of gabapentin and discharged home.  May 2020 patient had onset of high blood pressure, was prescribed isosorbide nitrate, but this triggered headache.  Patient was then noted to be unable to protrude her tongue and therefore was taken to the hospital.  Stroke evaluation was performed.  MRI of the brain was negative.  Patient was diagnosed hypertensive emergency versus medication side effect.  Since that time symptoms have resolved.  However blood pressure continues to be quite elevated.  Today blood pressure 178/104.   REVIEW OF SYSTEMS: Full 14 system review of systems performed and negative with exception of: as per HPI  ALLERGIES: Allergies  Allergen Reactions  . Amoxicillin Hives    vomiting  . Celecoxib Hives  . Insulins Nausea And Vomiting    Patient unsure of name of insulin, will call us back  . Vioxx [Rofecoxib] Hives  . Liraglutide Nausea And Vomiting  . Propranolol Nausea And Vomiting    diarrhea diarrhea diarrhea     HOME MEDICATIONS: Outpatient Medications Prior to Visit  Medication Sig Dispense Refill  . aspirin EC 81 MG tablet Take 81 mg by mouth daily.    Marland Kitchen atenolol (TENORMIN) 50 MG tablet Take 75 mg by mouth 2 (two) times daily.     . clopidogrel (PLAVIX) 75 MG tablet Take 75 mg by mouth daily at 12 noon.     . DULoxetine (CYMBALTA) 60 MG capsule Take 120 mg by mouth at bedtime.     . fenofibrate (TRICOR) 145  MG tablet Take 145 mg by mouth daily.    . fexofenadine (ALLEGRA) 180 MG tablet Take 180 mg by mouth daily.    . fluticasone (FLONASE) 50 MCG/ACT nasal spray Place 2 sprays into the nose daily.    . Fluticasone-Salmeterol (WIXELA INHUB) 100-50 MCG/DOSE AEPB Inhale 1 puff into the lungs 2 (two) times daily.    Marland Kitchen gabapentin (NEURONTIN) 600 MG tablet Take 600 mg by mouth 3 (three) times daily.    . hydrALAZINE (APRESOLINE) 50 MG tablet Take 50 mg by mouth 3 (three) times daily.    . Melatonin 1 MG CAPS Take by mouth.    . metoprolol tartrate (LOPRESSOR) 25 MG tablet Take 25 mg by mouth 2 (two) times daily.    . nitroGLYCERIN (NITROSTAT) 0.4 MG SL tablet Place 0.4 mg under the tongue every 5 (five) minutes as needed. For chest pain    . pantoprazole (PROTONIX) 40 MG tablet Take 40 mg by mouth 2 (two) times daily.    . potassium chloride (MICRO-K) 10 MEQ CR capsule Take 10 mEq by mouth daily at 12 noon.     . valsartan-hydrochlorothiazide (DIOVAN-HCT) 320-25 MG tablet TAKE 1 TABLET BY ORAL ROUTE ONCE DAILY FOR HIGH BLOOD PRESSURE    . Vitamin D, Ergocalciferol, (DRISDOL) 50000 UNITS CAPS capsule Take 50,000 Units by mouth every 7 (seven) days. Takes on Wednesdays    . zolpidem (AMBIEN) 10 MG tablet Take 10 mg by mouth at bedtime as  needed for sleep.      No facility-administered medications prior to visit.     PAST MEDICAL HISTORY: Past Medical History:  Diagnosis Date  . CAD (coronary artery disease)    a. s/p 2 CoStar DES to RCA 06/2004. b.  LHC 12/09:  EF 65%, oCFX 40%, oRCA 30%, then 30-40% leading into stented seg that is patent, (10-20% ISR), 40% beyond stent,;  c. EF 81%, poor exercise capacity, hypotensive BP response, + chest pain, normal nuclear images (overall low risk)  . CKD (chronic kidney disease)   . Colon polyps    a. s/p removal 01/2012 - was told one might be cancerous but was not diagnosed with colon cancer - plan is for f/u colonoscopy in 5 yrs.  Marland Kitchen GERD (gastroesophageal  reflux disease)   . Hx of echocardiogram    a. Echo 06/2011: EF 55-60%, Gr 1 diast dysfn, mild MR  . Insomnia   . Mini stroke ([place])    History of, in 2005 (before she was diagnosed with CAD).  . Osteoporosis   . Other and unspecified hyperlipidemia   . Other isolated or specific phobias   . Peripheral vascular disease, unspecified ([place])    a. s/p L common femoral & left illiac stenting previously (patent by angio in 2007). - ABI 06/2011 normal. b. Carotid US 0-39% BICA 06/2011 (for f/u 2 yrs);   c.  ABIs 9/13:  normal (1.1 bilaterally)  . Personal history of peptic ulcer disease    Remotely.  . Tobacco use disorder   . Type II or unspecified type diabetes mellitus without mention of complication, not stated as uncontrolled   . Unspecified essential hypertension     PAST SURGICAL HISTORY: Past Surgical History:  Procedure Laterality Date  . APPENDECTOMY    . CARDIAC CATHETERIZATION  2009   patent RCA stent. mild CAD otherwise.   Marland Kitchen CATARACT EXTRACTION, BILATERAL  2016  . CHOLECYSTECTOMY  2009  . hysterectomy (other)     age 37  . KNEE ARTHROSCOPY Left   . left arm surgery    . LEFT HEART CATHETERIZATION WITH CORONARY ANGIOGRAM N/A 01/16/2014   Procedure: LEFT HEART CATHETERIZATION WITH CORONARY ANGIOGRAM;  Surgeon: Blane Ohara, MD;  Location: [facility] CATH LAB;  Service: Cardiovascular;  Laterality: N/A;  . left leg angiography     stenting of the left common iliac artery  . left rotator cuff repair    . right coronary artery stent      FAMILY HISTORY: Family History  Problem Relation Age of Onset  . Heart attack Mother   . Allergies Mother   . Heart attack Father   . Allergies Father   . Kidney disease Father   . Hypertension Father   . Coronary artery disease Brother   . Breast cancer Daughter   . Breast cancer Maternal Aunt   . Breast cancer Maternal Aunt   . Breast cancer Paternal Aunt     SOCIAL HISTORY: Social History   Socioeconomic History  . Marital  status: Widowed    Spouse name: Not on file  . Number of children: Not on file  . Years of education: Not on file  . Highest education level: 8th grade  Occupational History  . Not on file  Social Needs  . Financial resource strain: Not on file  . Food insecurity:    Worry: Not on file    Inability: Not on file  . Transportation needs:    Medical: Not on file  Non-medical: Not on file  Tobacco Use  . Smoking status: Current Every Day Smoker    Packs/day: 1.00    Years: 44.00    Pack years: 44.00    Types: Cigarettes  . Smokeless tobacco: Never Used  . Tobacco comment: 12/29/18 1-1.5 ppd  Substance and Sexual Activity  . Alcohol use: No    Comment: Recovering alcoholic. Sober for >30 years.  . Drug use: No  . Sexual activity: Not on file  Lifestyle  . Physical activity:    Days per week: Not on file    Minutes per session: Not on file  . Stress: Not on file  Relationships  . Social connections:    Talks on phone: Not on file    Gets together: Not on file    Attends religious service: Not on file    Active member of club or organization: Not on file    Attends meetings of clubs or organizations: Not on file    Relationship status: Not on file  . Intimate partner violence:    Fear of current or ex partner: Not on file    Emotionally abused: Not on file    Physically abused: Not on file    Forced sexual activity: Not on file  Other Topics Concern  . Not on file  Social History Narrative   Lives in [place] with her daughter, and is currently not working outside the home.    Caffeine- coffee 1 cup, mtn dew        PHYSICAL EXAM   VIDEO EXAM  GENERAL EXAM/CONSTITUTIONAL:  Vitals: There were no vitals filed for this visit.  There is no height or weight on file to calculate BMI. Wt Readings from Last 3 Encounters:  12/23/18 145 lb 4.5 oz (65.9 kg)  01/16/14 145 lb (65.8 kg)  01/10/14 145 lb (65.8 kg)     Patient is in no distress; well developed,  nourished and groomed; neck is supple  EDENTULOUS   NEUROLOGIC: MENTAL STATUS:  No flowsheet data found.  awake, alert, oriented to person, place and time  recent and remote memory intact  normal attention and concentration  language fluent, comprehension intact, naming intact  fund of knowledge appropriate  CRANIAL NERVE:   2nd, 3rd, 4th, 6th - visual fields full to confrontation, extraocular muscles intact, no nystagmus  5th - facial sensation symmetric  7th - facial strength symmetric  8th - hearing intact  11th - shoulder shrug symmetric  12th - tongue protrusion midline  MOTOR:   MILD POSTURAL HAND TREMOR; MILD HEAD TREMOR; NO DRIFT IN BUE  SENSORY:   normal and symmetric to light touch  COORDINATION:   fine finger movements normal     DIAGNOSTIC DATA (LABS, IMAGING, TESTING) - I reviewed patient records, labs, notes, testing and imaging myself where available.  Lab Results  Component Value Date   WBC 9.0 12/23/2018   HGB 13.6 12/23/2018   HGB 13.3 12/23/2018   HCT 40.0 12/23/2018   HCT 41.4 12/23/2018   MCV 94.3 12/23/2018   PLT 226 12/23/2018      Component Value Date/Time   NA 138 12/23/2018 2139   NA 139 12/23/2018 2139   K 3.2 (L) 12/23/2018 2139   K 3.4 (L) 12/23/2018 2139   CL 107 12/23/2018 2139   CL 106 12/23/2018 2139   CO2 17 (L) 12/23/2018 2139   GLUCOSE 179 (H) 12/23/2018 2139   GLUCOSE 175 (H) 12/23/2018 2139   BUN 35 (H) 12/23/2018  2139   BUN 36 (H) 12/23/2018 2139   CREATININE 1.30 (H) 12/23/2018 2139   CREATININE 1.22 (H) 12/23/2018 2139   CALCIUM 9.4 12/23/2018 2139   PROT 6.3 (L) 12/23/2018 2139   ALBUMIN 3.7 12/23/2018 2139   AST 18 12/23/2018 2139   ALT 13 12/23/2018 2139   ALKPHOS 44 12/23/2018 2139   BILITOT 0.5 12/23/2018 2139   GFRNONAA 45 (L) 12/23/2018 2139   GFRAA 53 (L) 12/23/2018 2139   Lab Results  Component Value Date   CHOL 121 04/09/2012   HDL 31 (L) 04/09/2012   LDLCALC 52 04/09/2012    TRIG 191 (H) 04/09/2012   CHOLHDL 3.9 04/09/2012   No results found for: HGBA1C No results found for: VITAMINB12 Lab Results  Component Value Date   TSH 2.988 04/08/2012    12/23/18 MRI brain [I reviewed images myself and agree with interpretation. -VRP]  1. No acute intracranial abnormality. 2. Chronic small vessel ischemia.  12/23/18 CT head 1. No acute intracranial infarct or other abnormality. 2. ASPECTS is 10. 3. Mild chronic microvascular ischemic disease.    ASSESSMENT AND PLAN  68 y.o. year old female here with transient slurred speech, numbness, vision change, likely related to hypertensive emergency.   Dx:  1. Hypertensive emergency   2. Slurred speech    Virtual Visit via Video Note  I connected with Lucyann Romano Apo on 01/03/19 at 11:00 AM EDT by a video enabled telemedicine application and verified that I am speaking with the correct person using two identifiers.  Location: Patient: home Provider: office   I discussed the limitations of evaluation and management by telemedicine and the availability of in person appointments. The patient expressed understanding and agreed to proceed.   I discussed the assessment and treatment plan with the patient. The patient was provided an opportunity to ask questions and all were answered. The patient agreed with the plan and demonstrated an understanding of the instructions.   The patient was advised to call back or seek an in-person evaluation if the symptoms worsen or if the condition fails to improve as anticipated.  I provided 30 minutes of non-face-to-face time during this encounter.   PLAN:  - continue BP control per PCP and cardiology  - continue aspirin, plavix, statin  Return for pending if symptoms worsen or fail to improve, return to PCP.    Penni Bombard, MD 3/88/8280, 03:49 AM Certified in Neurology, Neurophysiology and Neuroimaging  [facility] [address], [place]  [place], [place] [address] [phone]

## 2019-01-04 DIAGNOSIS — J449 Chronic obstructive pulmonary disease, unspecified: Secondary | ICD-10-CM | POA: Diagnosis not present

## 2019-01-04 DIAGNOSIS — R1310 Dysphagia, unspecified: Secondary | ICD-10-CM | POA: Diagnosis not present

## 2019-01-04 DIAGNOSIS — H53462 Homonymous bilateral field defects, left side: Secondary | ICD-10-CM | POA: Diagnosis not present

## 2019-01-04 DIAGNOSIS — I70213 Atherosclerosis of native arteries of extremities with intermittent claudication, bilateral legs: Secondary | ICD-10-CM | POA: Diagnosis not present

## 2019-01-04 DIAGNOSIS — I2510 Atherosclerotic heart disease of native coronary artery without angina pectoris: Secondary | ICD-10-CM | POA: Diagnosis not present

## 2019-01-04 DIAGNOSIS — E1151 Type 2 diabetes mellitus with diabetic peripheral angiopathy without gangrene: Secondary | ICD-10-CM | POA: Diagnosis not present

## 2019-01-05 DIAGNOSIS — H53462 Homonymous bilateral field defects, left side: Secondary | ICD-10-CM | POA: Diagnosis not present

## 2019-01-05 DIAGNOSIS — E1151 Type 2 diabetes mellitus with diabetic peripheral angiopathy without gangrene: Secondary | ICD-10-CM | POA: Diagnosis not present

## 2019-01-05 DIAGNOSIS — I70213 Atherosclerosis of native arteries of extremities with intermittent claudication, bilateral legs: Secondary | ICD-10-CM | POA: Diagnosis not present

## 2019-01-05 DIAGNOSIS — J449 Chronic obstructive pulmonary disease, unspecified: Secondary | ICD-10-CM | POA: Diagnosis not present

## 2019-01-05 DIAGNOSIS — R1310 Dysphagia, unspecified: Secondary | ICD-10-CM | POA: Diagnosis not present

## 2019-01-05 DIAGNOSIS — I2510 Atherosclerotic heart disease of native coronary artery without angina pectoris: Secondary | ICD-10-CM | POA: Diagnosis not present

## 2019-01-06 DIAGNOSIS — I10 Essential (primary) hypertension: Secondary | ICD-10-CM | POA: Diagnosis not present

## 2019-01-06 DIAGNOSIS — Z955 Presence of coronary angioplasty implant and graft: Secondary | ICD-10-CM | POA: Diagnosis not present

## 2019-01-06 DIAGNOSIS — J439 Emphysema, unspecified: Secondary | ICD-10-CM | POA: Diagnosis not present

## 2019-01-06 DIAGNOSIS — R002 Palpitations: Secondary | ICD-10-CM | POA: Diagnosis not present

## 2019-01-06 DIAGNOSIS — I2510 Atherosclerotic heart disease of native coronary artery without angina pectoris: Secondary | ICD-10-CM | POA: Diagnosis not present

## 2019-01-06 DIAGNOSIS — I209 Angina pectoris, unspecified: Secondary | ICD-10-CM | POA: Diagnosis not present

## 2019-01-06 DIAGNOSIS — E785 Hyperlipidemia, unspecified: Secondary | ICD-10-CM | POA: Diagnosis not present

## 2019-01-06 DIAGNOSIS — Z720 Tobacco use: Secondary | ICD-10-CM | POA: Diagnosis not present

## 2019-01-10 DIAGNOSIS — E1151 Type 2 diabetes mellitus with diabetic peripheral angiopathy without gangrene: Secondary | ICD-10-CM | POA: Diagnosis not present

## 2019-01-10 DIAGNOSIS — I70213 Atherosclerosis of native arteries of extremities with intermittent claudication, bilateral legs: Secondary | ICD-10-CM | POA: Diagnosis not present

## 2019-01-10 DIAGNOSIS — I2510 Atherosclerotic heart disease of native coronary artery without angina pectoris: Secondary | ICD-10-CM | POA: Diagnosis not present

## 2019-01-10 DIAGNOSIS — H53462 Homonymous bilateral field defects, left side: Secondary | ICD-10-CM | POA: Diagnosis not present

## 2019-01-10 DIAGNOSIS — R1310 Dysphagia, unspecified: Secondary | ICD-10-CM | POA: Diagnosis not present

## 2019-01-10 DIAGNOSIS — J449 Chronic obstructive pulmonary disease, unspecified: Secondary | ICD-10-CM | POA: Diagnosis not present

## 2019-01-20 DIAGNOSIS — R002 Palpitations: Secondary | ICD-10-CM | POA: Diagnosis not present

## 2019-01-23 DIAGNOSIS — G43909 Migraine, unspecified, not intractable, without status migrainosus: Secondary | ICD-10-CM | POA: Diagnosis not present

## 2019-01-23 DIAGNOSIS — I10 Essential (primary) hypertension: Secondary | ICD-10-CM | POA: Diagnosis not present

## 2019-01-27 DIAGNOSIS — R002 Palpitations: Secondary | ICD-10-CM | POA: Diagnosis not present

## 2019-01-31 DIAGNOSIS — E7800 Pure hypercholesterolemia, unspecified: Secondary | ICD-10-CM | POA: Diagnosis not present

## 2019-01-31 DIAGNOSIS — E0789 Other specified disorders of thyroid: Secondary | ICD-10-CM | POA: Diagnosis not present

## 2019-01-31 DIAGNOSIS — I10 Essential (primary) hypertension: Secondary | ICD-10-CM | POA: Diagnosis not present

## 2019-01-31 DIAGNOSIS — E038 Other specified hypothyroidism: Secondary | ICD-10-CM | POA: Diagnosis not present

## 2019-01-31 DIAGNOSIS — E039 Hypothyroidism, unspecified: Secondary | ICD-10-CM | POA: Diagnosis not present

## 2019-01-31 DIAGNOSIS — E119 Type 2 diabetes mellitus without complications: Secondary | ICD-10-CM | POA: Diagnosis not present

## 2019-01-31 DIAGNOSIS — K219 Gastro-esophageal reflux disease without esophagitis: Secondary | ICD-10-CM | POA: Diagnosis not present

## 2019-01-31 DIAGNOSIS — F331 Major depressive disorder, recurrent, moderate: Secondary | ICD-10-CM | POA: Diagnosis not present

## 2019-02-14 DIAGNOSIS — R109 Unspecified abdominal pain: Secondary | ICD-10-CM | POA: Diagnosis not present

## 2019-02-14 DIAGNOSIS — K921 Melena: Secondary | ICD-10-CM | POA: Diagnosis not present

## 2019-02-14 DIAGNOSIS — R197 Diarrhea, unspecified: Secondary | ICD-10-CM | POA: Diagnosis not present

## 2019-02-14 DIAGNOSIS — K5909 Other constipation: Secondary | ICD-10-CM | POA: Diagnosis not present

## 2019-02-17 DIAGNOSIS — Z720 Tobacco use: Secondary | ICD-10-CM | POA: Diagnosis not present

## 2019-02-17 DIAGNOSIS — I10 Essential (primary) hypertension: Secondary | ICD-10-CM | POA: Diagnosis not present

## 2019-02-17 DIAGNOSIS — I2510 Atherosclerotic heart disease of native coronary artery without angina pectoris: Secondary | ICD-10-CM | POA: Diagnosis not present

## 2019-02-17 DIAGNOSIS — R002 Palpitations: Secondary | ICD-10-CM | POA: Diagnosis not present

## 2019-02-17 DIAGNOSIS — R0789 Other chest pain: Secondary | ICD-10-CM | POA: Diagnosis not present

## 2019-02-17 DIAGNOSIS — Z955 Presence of coronary angioplasty implant and graft: Secondary | ICD-10-CM | POA: Diagnosis not present

## 2019-02-17 DIAGNOSIS — J439 Emphysema, unspecified: Secondary | ICD-10-CM | POA: Diagnosis not present

## 2019-02-20 DIAGNOSIS — R195 Other fecal abnormalities: Secondary | ICD-10-CM | POA: Diagnosis not present

## 2019-02-20 DIAGNOSIS — K7689 Other specified diseases of liver: Secondary | ICD-10-CM | POA: Diagnosis not present

## 2019-02-20 DIAGNOSIS — K5900 Constipation, unspecified: Secondary | ICD-10-CM | POA: Diagnosis not present

## 2019-02-20 DIAGNOSIS — R109 Unspecified abdominal pain: Secondary | ICD-10-CM | POA: Diagnosis not present

## 2019-02-21 DIAGNOSIS — R0789 Other chest pain: Secondary | ICD-10-CM | POA: Diagnosis not present

## 2019-02-21 DIAGNOSIS — I2510 Atherosclerotic heart disease of native coronary artery without angina pectoris: Secondary | ICD-10-CM | POA: Diagnosis not present

## 2019-03-06 DIAGNOSIS — I10 Essential (primary) hypertension: Secondary | ICD-10-CM | POA: Diagnosis not present

## 2019-03-20 DIAGNOSIS — G2581 Restless legs syndrome: Secondary | ICD-10-CM | POA: Diagnosis not present

## 2019-03-20 DIAGNOSIS — M79606 Pain in leg, unspecified: Secondary | ICD-10-CM | POA: Diagnosis not present

## 2019-04-14 DIAGNOSIS — Z955 Presence of coronary angioplasty implant and graft: Secondary | ICD-10-CM | POA: Diagnosis not present

## 2019-04-14 DIAGNOSIS — I10 Essential (primary) hypertension: Secondary | ICD-10-CM | POA: Diagnosis not present

## 2019-04-14 DIAGNOSIS — E876 Hypokalemia: Secondary | ICD-10-CM | POA: Diagnosis not present

## 2019-04-14 DIAGNOSIS — E785 Hyperlipidemia, unspecified: Secondary | ICD-10-CM | POA: Diagnosis not present

## 2019-04-14 DIAGNOSIS — G4700 Insomnia, unspecified: Secondary | ICD-10-CM | POA: Diagnosis not present

## 2019-04-14 DIAGNOSIS — H9193 Unspecified hearing loss, bilateral: Secondary | ICD-10-CM | POA: Diagnosis not present

## 2019-04-14 DIAGNOSIS — F325 Major depressive disorder, single episode, in full remission: Secondary | ICD-10-CM | POA: Diagnosis not present

## 2019-04-14 DIAGNOSIS — F419 Anxiety disorder, unspecified: Secondary | ICD-10-CM | POA: Diagnosis not present

## 2019-04-14 DIAGNOSIS — I2510 Atherosclerotic heart disease of native coronary artery without angina pectoris: Secondary | ICD-10-CM | POA: Diagnosis not present

## 2019-04-14 DIAGNOSIS — H547 Unspecified visual loss: Secondary | ICD-10-CM | POA: Diagnosis not present

## 2019-04-20 DIAGNOSIS — Z961 Presence of intraocular lens: Secondary | ICD-10-CM | POA: Diagnosis not present

## 2019-04-20 DIAGNOSIS — H04123 Dry eye syndrome of bilateral lacrimal glands: Secondary | ICD-10-CM | POA: Diagnosis not present

## 2019-04-20 DIAGNOSIS — E119 Type 2 diabetes mellitus without complications: Secondary | ICD-10-CM | POA: Diagnosis not present

## 2019-04-20 DIAGNOSIS — H26492 Other secondary cataract, left eye: Secondary | ICD-10-CM | POA: Diagnosis not present

## 2019-04-26 DIAGNOSIS — E7800 Pure hypercholesterolemia, unspecified: Secondary | ICD-10-CM | POA: Diagnosis not present

## 2019-05-04 DIAGNOSIS — E7800 Pure hypercholesterolemia, unspecified: Secondary | ICD-10-CM | POA: Diagnosis not present

## 2019-05-04 DIAGNOSIS — E782 Mixed hyperlipidemia: Secondary | ICD-10-CM | POA: Diagnosis not present

## 2019-05-04 DIAGNOSIS — R5383 Other fatigue: Secondary | ICD-10-CM | POA: Diagnosis not present

## 2019-05-04 DIAGNOSIS — Z23 Encounter for immunization: Secondary | ICD-10-CM | POA: Diagnosis not present

## 2019-05-04 DIAGNOSIS — R7303 Prediabetes: Secondary | ICD-10-CM | POA: Diagnosis not present

## 2019-05-04 DIAGNOSIS — I10 Essential (primary) hypertension: Secondary | ICD-10-CM | POA: Diagnosis not present

## 2019-06-19 DIAGNOSIS — I4581 Long QT syndrome: Secondary | ICD-10-CM | POA: Diagnosis not present

## 2019-06-19 DIAGNOSIS — I959 Hypotension, unspecified: Secondary | ICD-10-CM | POA: Diagnosis not present

## 2019-06-19 DIAGNOSIS — K760 Fatty (change of) liver, not elsewhere classified: Secondary | ICD-10-CM | POA: Diagnosis not present

## 2019-06-19 DIAGNOSIS — E785 Hyperlipidemia, unspecified: Secondary | ICD-10-CM | POA: Diagnosis not present

## 2019-06-19 DIAGNOSIS — F17210 Nicotine dependence, cigarettes, uncomplicated: Secondary | ICD-10-CM | POA: Diagnosis not present

## 2019-06-19 DIAGNOSIS — R918 Other nonspecific abnormal finding of lung field: Secondary | ICD-10-CM | POA: Diagnosis not present

## 2019-06-19 DIAGNOSIS — I10 Essential (primary) hypertension: Secondary | ICD-10-CM | POA: Diagnosis not present

## 2019-06-19 DIAGNOSIS — E876 Hypokalemia: Secondary | ICD-10-CM | POA: Diagnosis not present

## 2019-06-19 DIAGNOSIS — I4519 Other right bundle-branch block: Secondary | ICD-10-CM | POA: Diagnosis not present

## 2019-06-19 DIAGNOSIS — R0602 Shortness of breath: Secondary | ICD-10-CM | POA: Diagnosis not present

## 2019-06-19 DIAGNOSIS — I4510 Unspecified right bundle-branch block: Secondary | ICD-10-CM | POA: Diagnosis not present

## 2019-06-19 DIAGNOSIS — I252 Old myocardial infarction: Secondary | ICD-10-CM | POA: Diagnosis not present

## 2019-06-19 DIAGNOSIS — R002 Palpitations: Secondary | ICD-10-CM | POA: Diagnosis not present

## 2019-06-19 DIAGNOSIS — I2510 Atherosclerotic heart disease of native coronary artery without angina pectoris: Secondary | ICD-10-CM | POA: Diagnosis not present

## 2019-06-19 DIAGNOSIS — Z720 Tobacco use: Secondary | ICD-10-CM | POA: Diagnosis not present

## 2019-06-19 DIAGNOSIS — R05 Cough: Secondary | ICD-10-CM | POA: Diagnosis not present

## 2019-06-19 DIAGNOSIS — R000 Tachycardia, unspecified: Secondary | ICD-10-CM | POA: Diagnosis not present

## 2019-06-19 DIAGNOSIS — Z955 Presence of coronary angioplasty implant and graft: Secondary | ICD-10-CM | POA: Diagnosis not present

## 2019-06-20 DIAGNOSIS — I4510 Unspecified right bundle-branch block: Secondary | ICD-10-CM | POA: Diagnosis not present

## 2019-06-20 DIAGNOSIS — J9801 Acute bronchospasm: Secondary | ICD-10-CM | POA: Diagnosis not present

## 2019-06-20 DIAGNOSIS — R000 Tachycardia, unspecified: Secondary | ICD-10-CM | POA: Diagnosis not present

## 2019-06-20 DIAGNOSIS — I4581 Long QT syndrome: Secondary | ICD-10-CM | POA: Diagnosis not present

## 2019-06-20 DIAGNOSIS — I4519 Other right bundle-branch block: Secondary | ICD-10-CM | POA: Diagnosis not present

## 2019-06-20 DIAGNOSIS — J449 Chronic obstructive pulmonary disease, unspecified: Secondary | ICD-10-CM | POA: Diagnosis not present

## 2019-06-20 DIAGNOSIS — J209 Acute bronchitis, unspecified: Secondary | ICD-10-CM | POA: Diagnosis not present

## 2019-06-20 DIAGNOSIS — F17200 Nicotine dependence, unspecified, uncomplicated: Secondary | ICD-10-CM | POA: Diagnosis not present

## 2019-06-23 DIAGNOSIS — Z720 Tobacco use: Secondary | ICD-10-CM | POA: Diagnosis not present

## 2019-06-23 DIAGNOSIS — J439 Emphysema, unspecified: Secondary | ICD-10-CM | POA: Diagnosis not present

## 2019-06-23 DIAGNOSIS — I2510 Atherosclerotic heart disease of native coronary artery without angina pectoris: Secondary | ICD-10-CM | POA: Diagnosis not present

## 2019-06-23 DIAGNOSIS — I10 Essential (primary) hypertension: Secondary | ICD-10-CM | POA: Diagnosis not present

## 2019-06-23 DIAGNOSIS — Z955 Presence of coronary angioplasty implant and graft: Secondary | ICD-10-CM | POA: Diagnosis not present

## 2019-06-23 DIAGNOSIS — E785 Hyperlipidemia, unspecified: Secondary | ICD-10-CM | POA: Diagnosis not present

## 2019-06-27 DIAGNOSIS — H26492 Other secondary cataract, left eye: Secondary | ICD-10-CM | POA: Diagnosis not present

## 2019-07-05 DIAGNOSIS — R05 Cough: Secondary | ICD-10-CM | POA: Diagnosis not present

## 2019-07-05 DIAGNOSIS — F17210 Nicotine dependence, cigarettes, uncomplicated: Secondary | ICD-10-CM | POA: Diagnosis not present

## 2019-07-05 DIAGNOSIS — R918 Other nonspecific abnormal finding of lung field: Secondary | ICD-10-CM | POA: Diagnosis not present

## 2019-07-27 DIAGNOSIS — I209 Angina pectoris, unspecified: Secondary | ICD-10-CM | POA: Diagnosis not present

## 2019-07-27 DIAGNOSIS — G43909 Migraine, unspecified, not intractable, without status migrainosus: Secondary | ICD-10-CM | POA: Diagnosis not present

## 2019-10-11 ENCOUNTER — Other Ambulatory Visit: Payer: Self-pay | Admitting: Adult Health

## 2020-08-16 DIAGNOSIS — R69 Illness, unspecified: Secondary | ICD-10-CM | POA: Diagnosis not present

## 2020-08-20 DIAGNOSIS — U071 COVID-19: Secondary | ICD-10-CM | POA: Diagnosis not present

## 2020-08-20 DIAGNOSIS — E860 Dehydration: Secondary | ICD-10-CM | POA: Diagnosis not present

## 2020-08-20 DIAGNOSIS — Z20822 Contact with and (suspected) exposure to covid-19: Secondary | ICD-10-CM | POA: Diagnosis not present

## 2020-08-20 DIAGNOSIS — R112 Nausea with vomiting, unspecified: Secondary | ICD-10-CM | POA: Diagnosis not present

## 2020-08-21 DIAGNOSIS — R112 Nausea with vomiting, unspecified: Secondary | ICD-10-CM | POA: Diagnosis not present

## 2020-08-21 DIAGNOSIS — Z20822 Contact with and (suspected) exposure to covid-19: Secondary | ICD-10-CM | POA: Diagnosis not present

## 2020-08-21 DIAGNOSIS — E860 Dehydration: Secondary | ICD-10-CM | POA: Diagnosis not present

## 2020-08-21 DIAGNOSIS — U071 COVID-19: Secondary | ICD-10-CM | POA: Diagnosis not present

## 2020-08-21 DIAGNOSIS — R0603 Acute respiratory distress: Secondary | ICD-10-CM | POA: Diagnosis not present

## 2020-09-12 DIAGNOSIS — E7800 Pure hypercholesterolemia, unspecified: Secondary | ICD-10-CM | POA: Diagnosis not present

## 2020-09-12 DIAGNOSIS — K219 Gastro-esophageal reflux disease without esophagitis: Secondary | ICD-10-CM | POA: Diagnosis not present

## 2020-09-12 DIAGNOSIS — R69 Illness, unspecified: Secondary | ICD-10-CM | POA: Diagnosis not present

## 2020-09-12 DIAGNOSIS — Z716 Tobacco abuse counseling: Secondary | ICD-10-CM | POA: Diagnosis not present

## 2020-09-12 DIAGNOSIS — G2581 Restless legs syndrome: Secondary | ICD-10-CM | POA: Diagnosis not present

## 2020-09-14 IMAGING — CT CT HEAD CODE STROKE W/O CM
3 series · 15 of 47 positions shown, 18 images · non-contrast
Comparison: Prior MRI from 11/27/2014.

CLINICAL DATA: Code stroke. Initial evaluation for acute left-sided
weakness.

EXAM:
CT HEAD WITHOUT CONTRAST
TECHNIQUE: Contiguous axial images were obtained from the base of the skull
through the vertex without intravenous contrast.

[Series 3: head 5.0 st · axial · 0.43mm/px · z∈[-91,+44]mm · 9 of 33 slices shown, 12 images]
[im 3/33  brain]
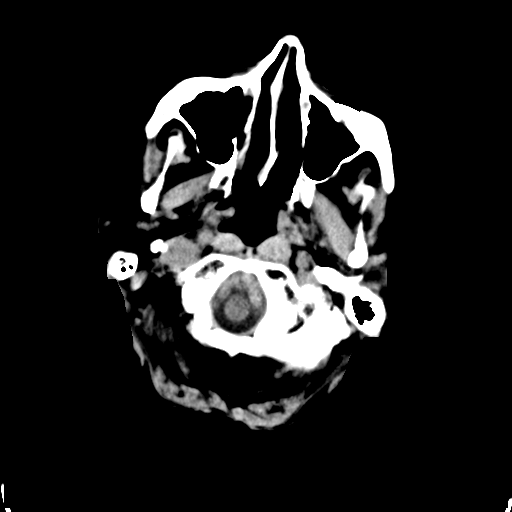
[im 3/33  bone]
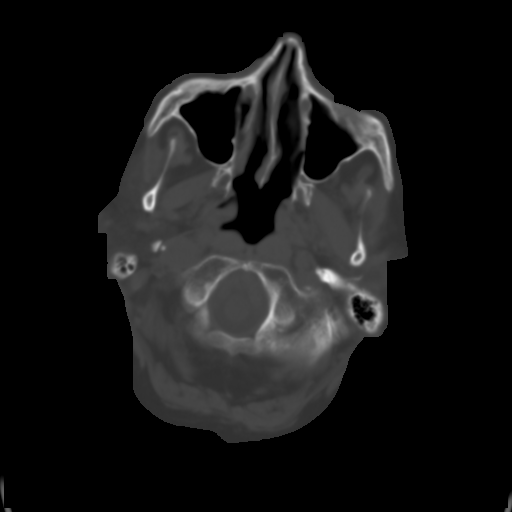
[im 6/33  brain]
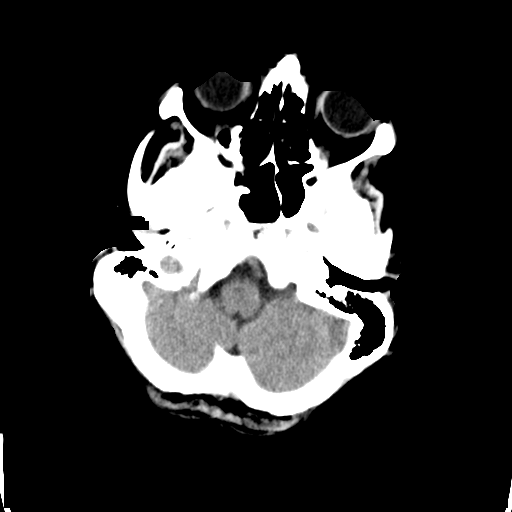
[im 9/33  brain]
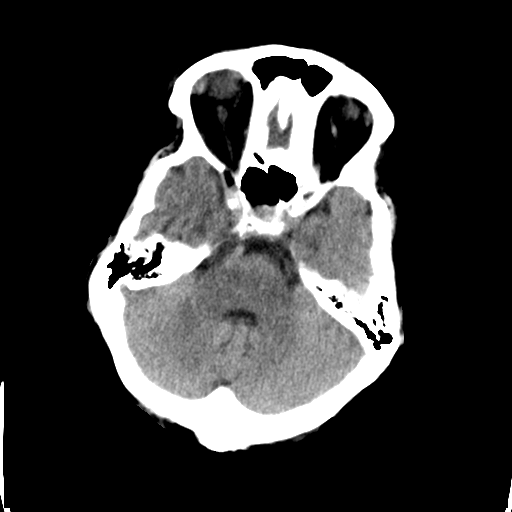
[im 13/33  brain]
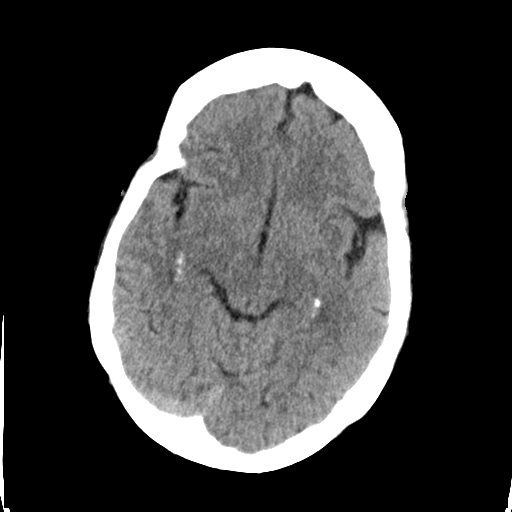
[im 17/33  brain]
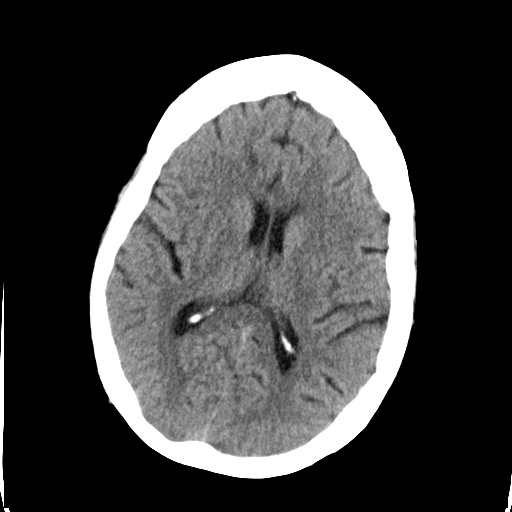
[im 17/33  bone]
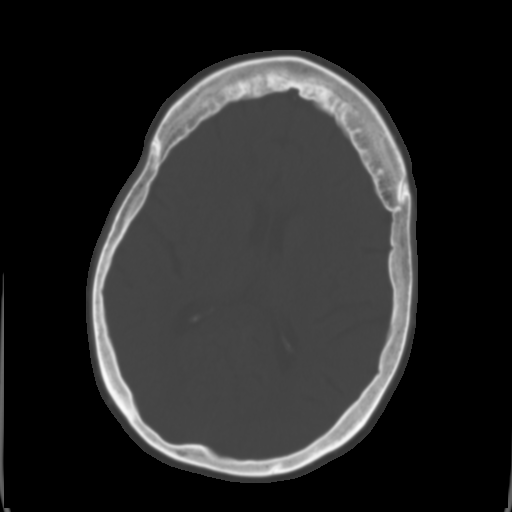
[im 20/33  brain]
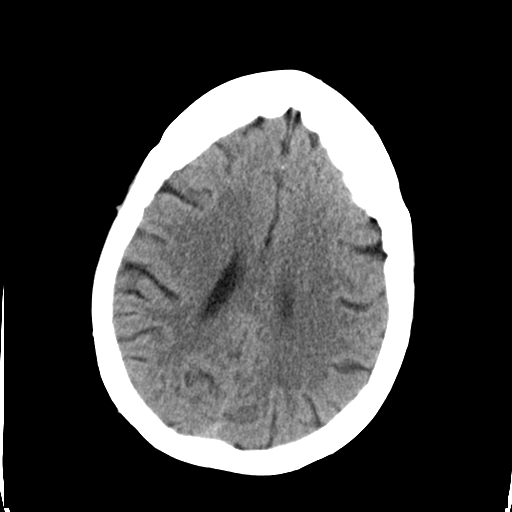
[im 24/33  brain]
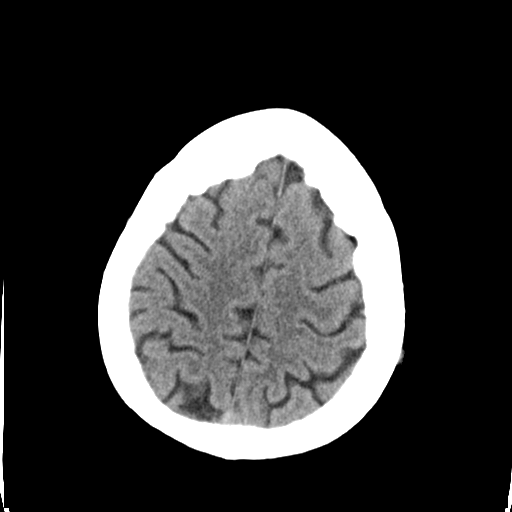
[im 27/33  brain]
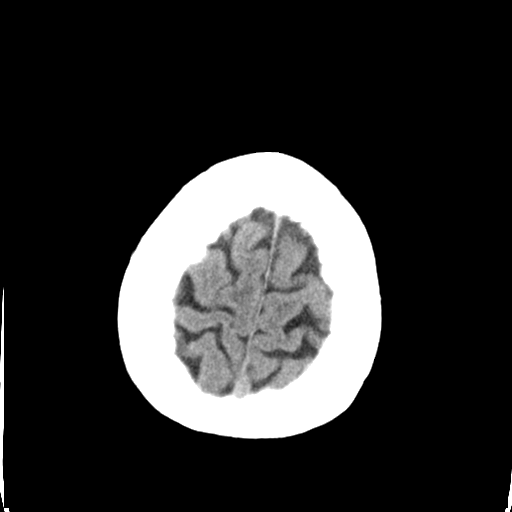
[im 30/33  brain]
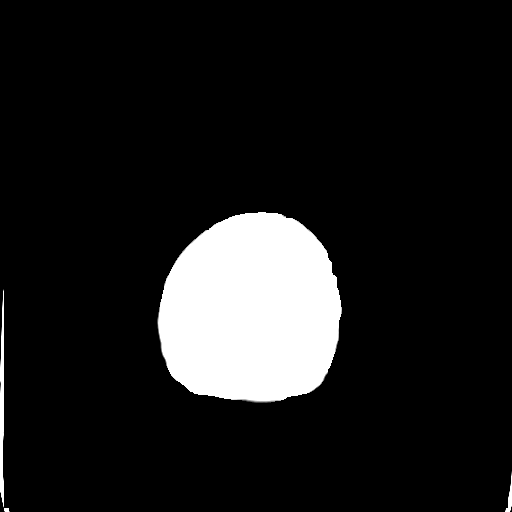
[im 30/33  bone]
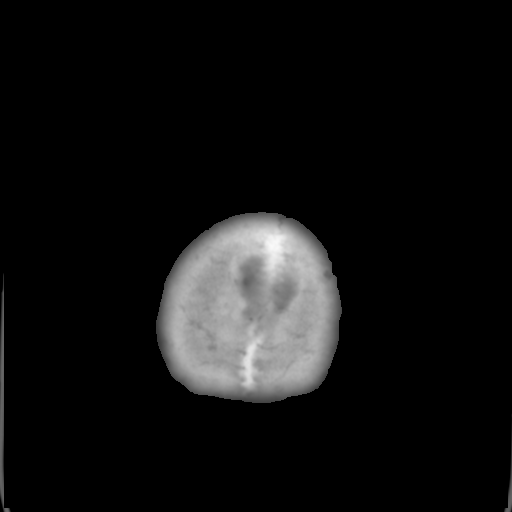

[Series 5: head 3.0 cor st · coronal · 0.32mm/px · 3 of 67 slices shown]
[im 23/67  brain]
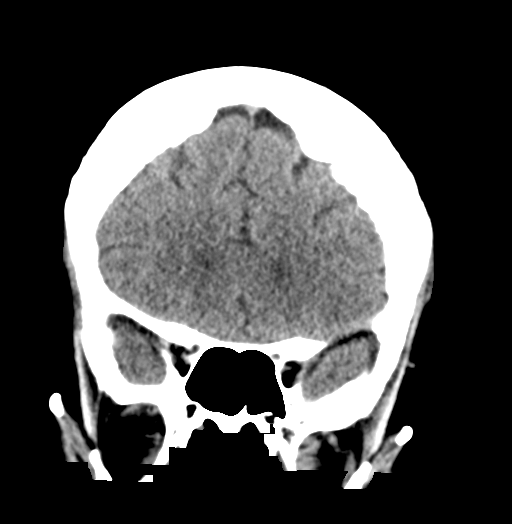
[im 30/67  brain]
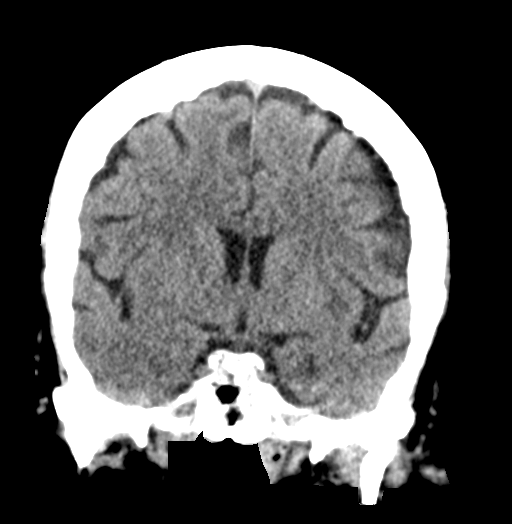
[im 37/67  brain]
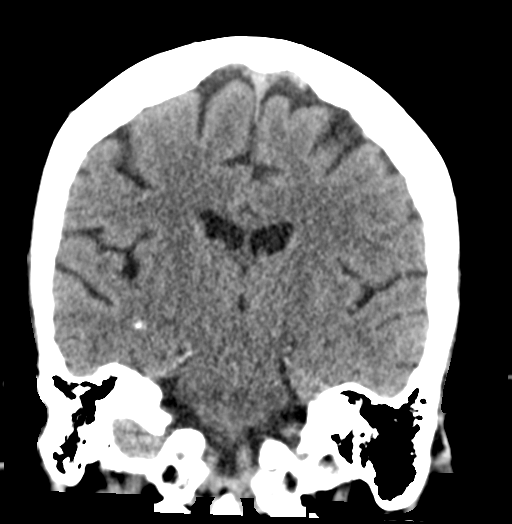

[Series 6: head 3.0 sag st · sagittal · 0.32mm/px · 3 of 54 slices shown]
[im 18/54  brain]
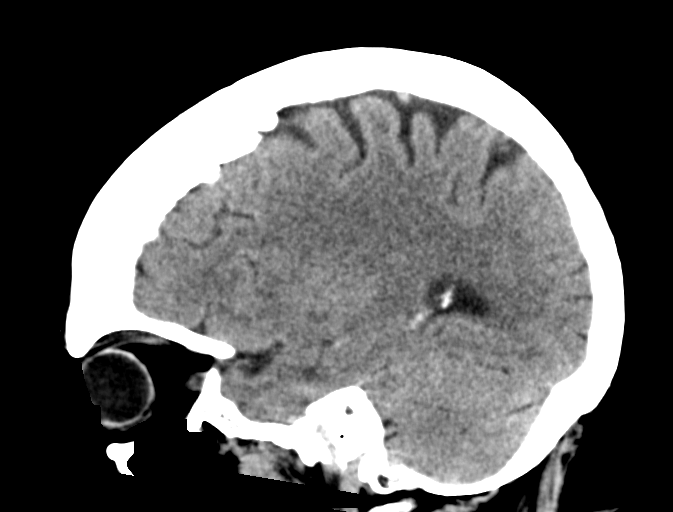
[im 27/54  brain]
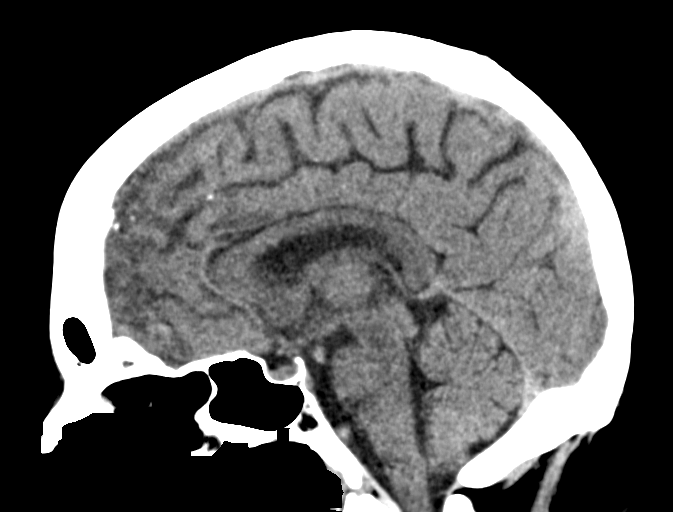
[im 36/54  brain]
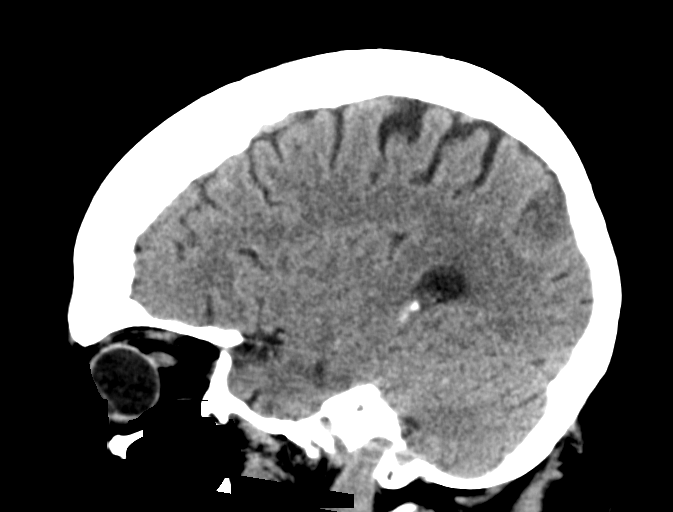

[15 of 47 positions shown; findings below may reference images not displayed]

FINDINGS: Brain: Cerebral volume within normal limits. Mild chronic
microvascular ischemic disease. Possible small remote lacunar
infarct at the right lentiform nucleus. No acute intracranial
hemorrhage. No acute large vessel territory infarct. No mass lesion,
midline shift or mass effect. No hydrocephalus. No extra-axial fluid
collection.

Vascular: No hyperdense vessel.

Skull: Negative.

Sinuses/Orbits: Globes and orbital soft tissues within normal
limits. Paranasal sinuses and mastoids are clear.

Other: None.

ASPECTS ([facility] Stroke Program Early CT Score)

- Ganglionic level infarction (caudate, lentiform nuclei, internal
capsule, insula, M1-M3 cortex): 7

- Supraganglionic infarction (M4-M6 cortex): 3

Total score (0-10 with 10 being normal): 10
IMPRESSION: 1. No acute intracranial infarct or other abnormality.
2. ASPECTS is 10.
3. Mild chronic microvascular ischemic disease.

These results were communicated to Dr. Sacklen at [DATE] pmon
12/23/2018by text page via the [facility] messaging system.

## 2020-09-19 DIAGNOSIS — I25119 Atherosclerotic heart disease of native coronary artery with unspecified angina pectoris: Secondary | ICD-10-CM | POA: Diagnosis not present

## 2020-09-19 DIAGNOSIS — E1165 Type 2 diabetes mellitus with hyperglycemia: Secondary | ICD-10-CM | POA: Diagnosis not present

## 2020-09-19 DIAGNOSIS — E663 Overweight: Secondary | ICD-10-CM | POA: Diagnosis not present

## 2020-09-19 DIAGNOSIS — R69 Illness, unspecified: Secondary | ICD-10-CM | POA: Diagnosis not present

## 2020-09-19 DIAGNOSIS — G43909 Migraine, unspecified, not intractable, without status migrainosus: Secondary | ICD-10-CM | POA: Diagnosis not present

## 2020-09-19 DIAGNOSIS — J449 Chronic obstructive pulmonary disease, unspecified: Secondary | ICD-10-CM | POA: Diagnosis not present

## 2020-09-19 DIAGNOSIS — G2581 Restless legs syndrome: Secondary | ICD-10-CM | POA: Diagnosis not present

## 2020-09-19 DIAGNOSIS — E785 Hyperlipidemia, unspecified: Secondary | ICD-10-CM | POA: Diagnosis not present

## 2020-09-19 DIAGNOSIS — Z008 Encounter for other general examination: Secondary | ICD-10-CM | POA: Diagnosis not present

## 2020-09-21 DIAGNOSIS — R0603 Acute respiratory distress: Secondary | ICD-10-CM | POA: Diagnosis not present

## 2020-09-21 DIAGNOSIS — U071 COVID-19: Secondary | ICD-10-CM | POA: Diagnosis not present

## 2020-10-19 DIAGNOSIS — U071 COVID-19: Secondary | ICD-10-CM | POA: Diagnosis not present

## 2020-10-19 DIAGNOSIS — R0603 Acute respiratory distress: Secondary | ICD-10-CM | POA: Diagnosis not present

## 2020-10-21 DIAGNOSIS — K219 Gastro-esophageal reflux disease without esophagitis: Secondary | ICD-10-CM | POA: Diagnosis not present

## 2020-10-21 DIAGNOSIS — E0789 Other specified disorders of thyroid: Secondary | ICD-10-CM | POA: Diagnosis not present

## 2020-10-21 DIAGNOSIS — E7800 Pure hypercholesterolemia, unspecified: Secondary | ICD-10-CM | POA: Diagnosis not present

## 2020-10-21 DIAGNOSIS — E119 Type 2 diabetes mellitus without complications: Secondary | ICD-10-CM | POA: Diagnosis not present

## 2020-10-21 DIAGNOSIS — R69 Illness, unspecified: Secondary | ICD-10-CM | POA: Diagnosis not present

## 2020-10-21 DIAGNOSIS — E039 Hypothyroidism, unspecified: Secondary | ICD-10-CM | POA: Diagnosis not present

## 2020-10-21 DIAGNOSIS — I10 Essential (primary) hypertension: Secondary | ICD-10-CM | POA: Diagnosis not present

## 2020-10-21 DIAGNOSIS — Z716 Tobacco abuse counseling: Secondary | ICD-10-CM | POA: Diagnosis not present

## 2020-10-21 DIAGNOSIS — E038 Other specified hypothyroidism: Secondary | ICD-10-CM | POA: Diagnosis not present

## 2020-11-19 DIAGNOSIS — R0603 Acute respiratory distress: Secondary | ICD-10-CM | POA: Diagnosis not present

## 2020-11-19 DIAGNOSIS — U071 COVID-19: Secondary | ICD-10-CM | POA: Diagnosis not present

## 2020-12-19 DIAGNOSIS — R0603 Acute respiratory distress: Secondary | ICD-10-CM | POA: Diagnosis not present

## 2020-12-19 DIAGNOSIS — U071 COVID-19: Secondary | ICD-10-CM | POA: Diagnosis not present

## 2021-01-14 DIAGNOSIS — E119 Type 2 diabetes mellitus without complications: Secondary | ICD-10-CM | POA: Diagnosis not present

## 2021-01-19 DIAGNOSIS — R0603 Acute respiratory distress: Secondary | ICD-10-CM | POA: Diagnosis not present

## 2021-01-19 DIAGNOSIS — U071 COVID-19: Secondary | ICD-10-CM | POA: Diagnosis not present

## 2021-02-03 DIAGNOSIS — M1990 Unspecified osteoarthritis, unspecified site: Secondary | ICD-10-CM | POA: Diagnosis not present

## 2021-02-03 DIAGNOSIS — R52 Pain, unspecified: Secondary | ICD-10-CM | POA: Diagnosis not present

## 2021-02-14 DIAGNOSIS — E7800 Pure hypercholesterolemia, unspecified: Secondary | ICD-10-CM | POA: Diagnosis not present

## 2021-02-14 DIAGNOSIS — I10 Essential (primary) hypertension: Secondary | ICD-10-CM | POA: Diagnosis not present

## 2021-02-14 DIAGNOSIS — E119 Type 2 diabetes mellitus without complications: Secondary | ICD-10-CM | POA: Diagnosis not present

## 2021-02-18 DIAGNOSIS — U071 COVID-19: Secondary | ICD-10-CM | POA: Diagnosis not present

## 2021-02-18 DIAGNOSIS — R0603 Acute respiratory distress: Secondary | ICD-10-CM | POA: Diagnosis not present

## 2021-03-21 DIAGNOSIS — R0603 Acute respiratory distress: Secondary | ICD-10-CM | POA: Diagnosis not present

## 2021-03-21 DIAGNOSIS — U071 COVID-19: Secondary | ICD-10-CM | POA: Diagnosis not present

## 2021-04-21 DIAGNOSIS — U071 COVID-19: Secondary | ICD-10-CM | POA: Diagnosis not present

## 2021-04-21 DIAGNOSIS — R69 Illness, unspecified: Secondary | ICD-10-CM | POA: Diagnosis not present

## 2021-04-21 DIAGNOSIS — E7800 Pure hypercholesterolemia, unspecified: Secondary | ICD-10-CM | POA: Diagnosis not present

## 2021-04-21 DIAGNOSIS — Z716 Tobacco abuse counseling: Secondary | ICD-10-CM | POA: Diagnosis not present

## 2021-04-21 DIAGNOSIS — I10 Essential (primary) hypertension: Secondary | ICD-10-CM | POA: Diagnosis not present

## 2021-04-21 DIAGNOSIS — K219 Gastro-esophageal reflux disease without esophagitis: Secondary | ICD-10-CM | POA: Diagnosis not present

## 2021-04-21 DIAGNOSIS — R0603 Acute respiratory distress: Secondary | ICD-10-CM | POA: Diagnosis not present

## 2021-04-21 DIAGNOSIS — Z0000 Encounter for general adult medical examination without abnormal findings: Secondary | ICD-10-CM | POA: Diagnosis not present

## 2021-05-16 DIAGNOSIS — Z0000 Encounter for general adult medical examination without abnormal findings: Secondary | ICD-10-CM | POA: Diagnosis not present

## 2021-05-21 DIAGNOSIS — R0603 Acute respiratory distress: Secondary | ICD-10-CM | POA: Diagnosis not present

## 2021-05-21 DIAGNOSIS — U071 COVID-19: Secondary | ICD-10-CM | POA: Diagnosis not present

## 2021-05-23 DIAGNOSIS — R69 Illness, unspecified: Secondary | ICD-10-CM | POA: Diagnosis not present

## 2021-05-23 DIAGNOSIS — Z23 Encounter for immunization: Secondary | ICD-10-CM | POA: Diagnosis not present

## 2021-05-23 DIAGNOSIS — R42 Dizziness and giddiness: Secondary | ICD-10-CM | POA: Diagnosis not present

## 2021-06-08 DIAGNOSIS — R410 Disorientation, unspecified: Secondary | ICD-10-CM | POA: Diagnosis not present

## 2021-06-08 DIAGNOSIS — R531 Weakness: Secondary | ICD-10-CM | POA: Diagnosis not present

## 2021-06-08 DIAGNOSIS — R404 Transient alteration of awareness: Secondary | ICD-10-CM | POA: Diagnosis not present

## 2021-06-08 DIAGNOSIS — R569 Unspecified convulsions: Secondary | ICD-10-CM | POA: Diagnosis not present

## 2021-06-08 DIAGNOSIS — I6522 Occlusion and stenosis of left carotid artery: Secondary | ICD-10-CM | POA: Diagnosis not present

## 2021-06-08 DIAGNOSIS — R739 Hyperglycemia, unspecified: Secondary | ICD-10-CM | POA: Diagnosis not present

## 2021-06-08 DIAGNOSIS — I6503 Occlusion and stenosis of bilateral vertebral arteries: Secondary | ICD-10-CM | POA: Diagnosis not present

## 2021-06-08 DIAGNOSIS — R0902 Hypoxemia: Secondary | ICD-10-CM | POA: Diagnosis not present

## 2021-06-08 DIAGNOSIS — E1165 Type 2 diabetes mellitus with hyperglycemia: Secondary | ICD-10-CM | POA: Diagnosis not present

## 2021-06-08 DIAGNOSIS — R4781 Slurred speech: Secondary | ICD-10-CM | POA: Diagnosis not present

## 2021-06-09 DIAGNOSIS — Z79899 Other long term (current) drug therapy: Secondary | ICD-10-CM | POA: Diagnosis not present

## 2021-06-09 DIAGNOSIS — I2510 Atherosclerotic heart disease of native coronary artery without angina pectoris: Secondary | ICD-10-CM | POA: Diagnosis not present

## 2021-06-09 DIAGNOSIS — Z7982 Long term (current) use of aspirin: Secondary | ICD-10-CM | POA: Diagnosis not present

## 2021-06-09 DIAGNOSIS — E785 Hyperlipidemia, unspecified: Secondary | ICD-10-CM | POA: Diagnosis not present

## 2021-06-09 DIAGNOSIS — F17210 Nicotine dependence, cigarettes, uncomplicated: Secondary | ICD-10-CM | POA: Diagnosis not present

## 2021-06-09 DIAGNOSIS — I10 Essential (primary) hypertension: Secondary | ICD-10-CM | POA: Diagnosis not present

## 2021-06-09 DIAGNOSIS — J449 Chronic obstructive pulmonary disease, unspecified: Secondary | ICD-10-CM | POA: Diagnosis not present

## 2021-06-09 DIAGNOSIS — I69354 Hemiplegia and hemiparesis following cerebral infarction affecting left non-dominant side: Secondary | ICD-10-CM | POA: Diagnosis not present

## 2021-06-09 DIAGNOSIS — R4781 Slurred speech: Secondary | ICD-10-CM | POA: Diagnosis not present

## 2021-06-09 DIAGNOSIS — R410 Disorientation, unspecified: Secondary | ICD-10-CM | POA: Diagnosis not present

## 2021-06-09 DIAGNOSIS — E1165 Type 2 diabetes mellitus with hyperglycemia: Secondary | ICD-10-CM | POA: Diagnosis not present

## 2021-06-09 DIAGNOSIS — N170 Acute kidney failure with tubular necrosis: Secondary | ICD-10-CM | POA: Diagnosis not present

## 2021-06-09 DIAGNOSIS — R29810 Facial weakness: Secondary | ICD-10-CM | POA: Diagnosis not present

## 2021-06-09 DIAGNOSIS — G9341 Metabolic encephalopathy: Secondary | ICD-10-CM | POA: Diagnosis not present

## 2021-06-09 DIAGNOSIS — M1990 Unspecified osteoarthritis, unspecified site: Secondary | ICD-10-CM | POA: Diagnosis not present

## 2021-06-09 DIAGNOSIS — R569 Unspecified convulsions: Secondary | ICD-10-CM | POA: Diagnosis not present

## 2021-06-09 DIAGNOSIS — E871 Hypo-osmolality and hyponatremia: Secondary | ICD-10-CM | POA: Diagnosis not present

## 2021-06-09 DIAGNOSIS — I6389 Other cerebral infarction: Secondary | ICD-10-CM | POA: Diagnosis not present

## 2021-06-09 DIAGNOSIS — I6503 Occlusion and stenosis of bilateral vertebral arteries: Secondary | ICD-10-CM | POA: Diagnosis not present

## 2021-06-09 DIAGNOSIS — I6522 Occlusion and stenosis of left carotid artery: Secondary | ICD-10-CM | POA: Diagnosis not present

## 2021-06-09 DIAGNOSIS — I252 Old myocardial infarction: Secondary | ICD-10-CM | POA: Diagnosis not present

## 2021-06-09 DIAGNOSIS — Z888 Allergy status to other drugs, medicaments and biological substances status: Secondary | ICD-10-CM | POA: Diagnosis not present

## 2021-06-09 DIAGNOSIS — R531 Weakness: Secondary | ICD-10-CM | POA: Diagnosis not present

## 2021-06-09 DIAGNOSIS — F419 Anxiety disorder, unspecified: Secondary | ICD-10-CM | POA: Diagnosis not present

## 2021-06-09 DIAGNOSIS — R69 Illness, unspecified: Secondary | ICD-10-CM | POA: Diagnosis not present

## 2021-06-09 DIAGNOSIS — I6782 Cerebral ischemia: Secondary | ICD-10-CM | POA: Diagnosis not present

## 2021-06-10 DIAGNOSIS — N170 Acute kidney failure with tubular necrosis: Secondary | ICD-10-CM | POA: Diagnosis not present

## 2021-06-10 DIAGNOSIS — R569 Unspecified convulsions: Secondary | ICD-10-CM | POA: Diagnosis not present

## 2021-06-10 DIAGNOSIS — G9341 Metabolic encephalopathy: Secondary | ICD-10-CM | POA: Diagnosis not present

## 2021-06-11 DIAGNOSIS — N170 Acute kidney failure with tubular necrosis: Secondary | ICD-10-CM | POA: Diagnosis not present

## 2021-06-11 DIAGNOSIS — R569 Unspecified convulsions: Secondary | ICD-10-CM | POA: Diagnosis not present

## 2021-06-11 DIAGNOSIS — G9341 Metabolic encephalopathy: Secondary | ICD-10-CM | POA: Diagnosis not present

## 2021-06-13 DIAGNOSIS — E785 Hyperlipidemia, unspecified: Secondary | ICD-10-CM | POA: Diagnosis not present

## 2021-06-13 DIAGNOSIS — I10 Essential (primary) hypertension: Secondary | ICD-10-CM | POA: Diagnosis not present

## 2021-06-13 DIAGNOSIS — I69354 Hemiplegia and hemiparesis following cerebral infarction affecting left non-dominant side: Secondary | ICD-10-CM | POA: Diagnosis not present

## 2021-06-13 DIAGNOSIS — I69328 Other speech and language deficits following cerebral infarction: Secondary | ICD-10-CM | POA: Diagnosis not present

## 2021-06-13 DIAGNOSIS — I2510 Atherosclerotic heart disease of native coronary artery without angina pectoris: Secondary | ICD-10-CM | POA: Diagnosis not present

## 2021-06-13 DIAGNOSIS — J449 Chronic obstructive pulmonary disease, unspecified: Secondary | ICD-10-CM | POA: Diagnosis not present

## 2021-06-13 DIAGNOSIS — R69 Illness, unspecified: Secondary | ICD-10-CM | POA: Diagnosis not present

## 2021-06-13 DIAGNOSIS — R569 Unspecified convulsions: Secondary | ICD-10-CM | POA: Diagnosis not present

## 2021-06-13 DIAGNOSIS — N170 Acute kidney failure with tubular necrosis: Secondary | ICD-10-CM | POA: Diagnosis not present

## 2021-06-13 DIAGNOSIS — E1165 Type 2 diabetes mellitus with hyperglycemia: Secondary | ICD-10-CM | POA: Diagnosis not present

## 2021-06-17 DIAGNOSIS — N170 Acute kidney failure with tubular necrosis: Secondary | ICD-10-CM | POA: Diagnosis not present

## 2021-06-17 DIAGNOSIS — I69328 Other speech and language deficits following cerebral infarction: Secondary | ICD-10-CM | POA: Diagnosis not present

## 2021-06-17 DIAGNOSIS — R569 Unspecified convulsions: Secondary | ICD-10-CM | POA: Diagnosis not present

## 2021-06-17 DIAGNOSIS — E785 Hyperlipidemia, unspecified: Secondary | ICD-10-CM | POA: Diagnosis not present

## 2021-06-17 DIAGNOSIS — I10 Essential (primary) hypertension: Secondary | ICD-10-CM | POA: Diagnosis not present

## 2021-06-17 DIAGNOSIS — R69 Illness, unspecified: Secondary | ICD-10-CM | POA: Diagnosis not present

## 2021-06-17 DIAGNOSIS — E1165 Type 2 diabetes mellitus with hyperglycemia: Secondary | ICD-10-CM | POA: Diagnosis not present

## 2021-06-17 DIAGNOSIS — J449 Chronic obstructive pulmonary disease, unspecified: Secondary | ICD-10-CM | POA: Diagnosis not present

## 2021-06-17 DIAGNOSIS — I2510 Atherosclerotic heart disease of native coronary artery without angina pectoris: Secondary | ICD-10-CM | POA: Diagnosis not present

## 2021-06-17 DIAGNOSIS — I69354 Hemiplegia and hemiparesis following cerebral infarction affecting left non-dominant side: Secondary | ICD-10-CM | POA: Diagnosis not present

## 2021-06-18 ENCOUNTER — Emergency Department (HOSPITAL_COMMUNITY)
Admission: EM | Admit: 2021-06-18 | Discharge: 2021-06-18 | Disposition: A | Discharge status: Home / Self Care | Payer: Medicare HMO | Attending: Emergency Medicine | Admitting: Emergency Medicine

## 2021-06-18 DIAGNOSIS — R569 Unspecified convulsions: Secondary | ICD-10-CM | POA: Insufficient documentation

## 2021-06-18 DIAGNOSIS — N170 Acute kidney failure with tubular necrosis: Secondary | ICD-10-CM | POA: Diagnosis not present

## 2021-06-18 DIAGNOSIS — I69328 Other speech and language deficits following cerebral infarction: Secondary | ICD-10-CM | POA: Diagnosis not present

## 2021-06-18 DIAGNOSIS — Z5321 Procedure and treatment not carried out due to patient leaving prior to being seen by health care provider: Secondary | ICD-10-CM | POA: Diagnosis not present

## 2021-06-18 DIAGNOSIS — I69354 Hemiplegia and hemiparesis following cerebral infarction affecting left non-dominant side: Secondary | ICD-10-CM | POA: Diagnosis not present

## 2021-06-18 DIAGNOSIS — J449 Chronic obstructive pulmonary disease, unspecified: Secondary | ICD-10-CM | POA: Diagnosis not present

## 2021-06-18 DIAGNOSIS — I2510 Atherosclerotic heart disease of native coronary artery without angina pectoris: Secondary | ICD-10-CM | POA: Diagnosis not present

## 2021-06-18 DIAGNOSIS — E1165 Type 2 diabetes mellitus with hyperglycemia: Secondary | ICD-10-CM | POA: Diagnosis not present

## 2021-06-18 DIAGNOSIS — E785 Hyperlipidemia, unspecified: Secondary | ICD-10-CM | POA: Diagnosis not present

## 2021-06-18 DIAGNOSIS — I10 Essential (primary) hypertension: Secondary | ICD-10-CM | POA: Diagnosis not present

## 2021-06-18 DIAGNOSIS — R69 Illness, unspecified: Secondary | ICD-10-CM | POA: Diagnosis not present

## 2021-06-18 NOTE — ED Notes (Signed)
Pt left AMA due to wait time  

## 2021-06-19 DIAGNOSIS — R569 Unspecified convulsions: Secondary | ICD-10-CM | POA: Diagnosis not present

## 2021-06-19 DIAGNOSIS — E785 Hyperlipidemia, unspecified: Secondary | ICD-10-CM | POA: Diagnosis not present

## 2021-06-19 DIAGNOSIS — E1169 Type 2 diabetes mellitus with other specified complication: Secondary | ICD-10-CM | POA: Diagnosis not present

## 2021-06-19 DIAGNOSIS — R69 Illness, unspecified: Secondary | ICD-10-CM | POA: Diagnosis not present

## 2021-06-21 DIAGNOSIS — U071 COVID-19: Secondary | ICD-10-CM | POA: Diagnosis not present

## 2021-06-21 DIAGNOSIS — R0603 Acute respiratory distress: Secondary | ICD-10-CM | POA: Diagnosis not present

## 2021-06-27 DIAGNOSIS — N170 Acute kidney failure with tubular necrosis: Secondary | ICD-10-CM | POA: Diagnosis not present

## 2021-06-27 DIAGNOSIS — I69328 Other speech and language deficits following cerebral infarction: Secondary | ICD-10-CM | POA: Diagnosis not present

## 2021-06-27 DIAGNOSIS — J449 Chronic obstructive pulmonary disease, unspecified: Secondary | ICD-10-CM | POA: Diagnosis not present

## 2021-06-27 DIAGNOSIS — E1165 Type 2 diabetes mellitus with hyperglycemia: Secondary | ICD-10-CM | POA: Diagnosis not present

## 2021-06-27 DIAGNOSIS — R569 Unspecified convulsions: Secondary | ICD-10-CM | POA: Diagnosis not present

## 2021-06-27 DIAGNOSIS — Z720 Tobacco use: Secondary | ICD-10-CM | POA: Diagnosis not present

## 2021-06-27 DIAGNOSIS — I69354 Hemiplegia and hemiparesis following cerebral infarction affecting left non-dominant side: Secondary | ICD-10-CM | POA: Diagnosis not present

## 2021-06-27 DIAGNOSIS — I2510 Atherosclerotic heart disease of native coronary artery without angina pectoris: Secondary | ICD-10-CM | POA: Diagnosis not present

## 2021-06-27 DIAGNOSIS — I10 Essential (primary) hypertension: Secondary | ICD-10-CM | POA: Diagnosis not present

## 2021-06-27 DIAGNOSIS — E785 Hyperlipidemia, unspecified: Secondary | ICD-10-CM | POA: Diagnosis not present

## 2021-06-27 DIAGNOSIS — R69 Illness, unspecified: Secondary | ICD-10-CM | POA: Diagnosis not present

## 2021-07-01 DIAGNOSIS — I2510 Atherosclerotic heart disease of native coronary artery without angina pectoris: Secondary | ICD-10-CM | POA: Diagnosis not present

## 2021-07-01 DIAGNOSIS — E1165 Type 2 diabetes mellitus with hyperglycemia: Secondary | ICD-10-CM | POA: Diagnosis not present

## 2021-07-01 DIAGNOSIS — I69328 Other speech and language deficits following cerebral infarction: Secondary | ICD-10-CM | POA: Diagnosis not present

## 2021-07-01 DIAGNOSIS — R569 Unspecified convulsions: Secondary | ICD-10-CM | POA: Diagnosis not present

## 2021-07-01 DIAGNOSIS — J449 Chronic obstructive pulmonary disease, unspecified: Secondary | ICD-10-CM | POA: Diagnosis not present

## 2021-07-01 DIAGNOSIS — I69354 Hemiplegia and hemiparesis following cerebral infarction affecting left non-dominant side: Secondary | ICD-10-CM | POA: Diagnosis not present

## 2021-07-01 DIAGNOSIS — I10 Essential (primary) hypertension: Secondary | ICD-10-CM | POA: Diagnosis not present

## 2021-07-01 DIAGNOSIS — E785 Hyperlipidemia, unspecified: Secondary | ICD-10-CM | POA: Diagnosis not present

## 2021-07-01 DIAGNOSIS — N170 Acute kidney failure with tubular necrosis: Secondary | ICD-10-CM | POA: Diagnosis not present

## 2021-07-01 DIAGNOSIS — R69 Illness, unspecified: Secondary | ICD-10-CM | POA: Diagnosis not present

## 2021-07-08 DIAGNOSIS — E1169 Type 2 diabetes mellitus with other specified complication: Secondary | ICD-10-CM | POA: Diagnosis not present

## 2021-07-08 DIAGNOSIS — I10 Essential (primary) hypertension: Secondary | ICD-10-CM | POA: Diagnosis not present

## 2021-07-08 DIAGNOSIS — E785 Hyperlipidemia, unspecified: Secondary | ICD-10-CM | POA: Diagnosis not present

## 2021-07-08 DIAGNOSIS — Z139 Encounter for screening, unspecified: Secondary | ICD-10-CM | POA: Diagnosis not present

## 2021-07-08 DIAGNOSIS — J449 Chronic obstructive pulmonary disease, unspecified: Secondary | ICD-10-CM | POA: Diagnosis not present

## 2021-07-08 DIAGNOSIS — Z136 Encounter for screening for cardiovascular disorders: Secondary | ICD-10-CM | POA: Diagnosis not present

## 2021-07-08 DIAGNOSIS — R69 Illness, unspecified: Secondary | ICD-10-CM | POA: Diagnosis not present

## 2021-07-08 DIAGNOSIS — I69328 Other speech and language deficits following cerebral infarction: Secondary | ICD-10-CM | POA: Diagnosis not present

## 2021-07-08 DIAGNOSIS — F17200 Nicotine dependence, unspecified, uncomplicated: Secondary | ICD-10-CM | POA: Diagnosis not present

## 2021-07-08 DIAGNOSIS — Z1331 Encounter for screening for depression: Secondary | ICD-10-CM | POA: Diagnosis not present

## 2021-07-08 DIAGNOSIS — I2510 Atherosclerotic heart disease of native coronary artery without angina pectoris: Secondary | ICD-10-CM | POA: Diagnosis not present

## 2021-07-08 DIAGNOSIS — E1165 Type 2 diabetes mellitus with hyperglycemia: Secondary | ICD-10-CM | POA: Diagnosis not present

## 2021-07-08 DIAGNOSIS — R569 Unspecified convulsions: Secondary | ICD-10-CM | POA: Diagnosis not present

## 2021-07-08 DIAGNOSIS — I69354 Hemiplegia and hemiparesis following cerebral infarction affecting left non-dominant side: Secondary | ICD-10-CM | POA: Diagnosis not present

## 2021-07-08 DIAGNOSIS — N170 Acute kidney failure with tubular necrosis: Secondary | ICD-10-CM | POA: Diagnosis not present

## 2021-07-08 DIAGNOSIS — Z0000 Encounter for general adult medical examination without abnormal findings: Secondary | ICD-10-CM | POA: Diagnosis not present

## 2021-07-08 DIAGNOSIS — Z789 Other specified health status: Secondary | ICD-10-CM | POA: Diagnosis not present

## 2021-07-08 DIAGNOSIS — F17210 Nicotine dependence, cigarettes, uncomplicated: Secondary | ICD-10-CM | POA: Diagnosis not present

## 2021-07-10 DIAGNOSIS — E1169 Type 2 diabetes mellitus with other specified complication: Secondary | ICD-10-CM | POA: Diagnosis not present

## 2021-07-10 DIAGNOSIS — J449 Chronic obstructive pulmonary disease, unspecified: Secondary | ICD-10-CM | POA: Diagnosis not present

## 2021-07-15 DIAGNOSIS — E1169 Type 2 diabetes mellitus with other specified complication: Secondary | ICD-10-CM | POA: Diagnosis not present

## 2021-07-16 DIAGNOSIS — N170 Acute kidney failure with tubular necrosis: Secondary | ICD-10-CM | POA: Diagnosis not present

## 2021-07-16 DIAGNOSIS — E785 Hyperlipidemia, unspecified: Secondary | ICD-10-CM | POA: Diagnosis not present

## 2021-07-16 DIAGNOSIS — R569 Unspecified convulsions: Secondary | ICD-10-CM | POA: Diagnosis not present

## 2021-07-16 DIAGNOSIS — I69354 Hemiplegia and hemiparesis following cerebral infarction affecting left non-dominant side: Secondary | ICD-10-CM | POA: Diagnosis not present

## 2021-07-16 DIAGNOSIS — I69328 Other speech and language deficits following cerebral infarction: Secondary | ICD-10-CM | POA: Diagnosis not present

## 2021-07-16 DIAGNOSIS — E1165 Type 2 diabetes mellitus with hyperglycemia: Secondary | ICD-10-CM | POA: Diagnosis not present

## 2021-07-16 DIAGNOSIS — I2510 Atherosclerotic heart disease of native coronary artery without angina pectoris: Secondary | ICD-10-CM | POA: Diagnosis not present

## 2021-07-16 DIAGNOSIS — I10 Essential (primary) hypertension: Secondary | ICD-10-CM | POA: Diagnosis not present

## 2021-07-16 DIAGNOSIS — R69 Illness, unspecified: Secondary | ICD-10-CM | POA: Diagnosis not present

## 2021-07-16 DIAGNOSIS — J449 Chronic obstructive pulmonary disease, unspecified: Secondary | ICD-10-CM | POA: Diagnosis not present

## 2021-07-21 DIAGNOSIS — U071 COVID-19: Secondary | ICD-10-CM | POA: Diagnosis not present

## 2021-07-21 DIAGNOSIS — R0603 Acute respiratory distress: Secondary | ICD-10-CM | POA: Diagnosis not present

## 2021-07-23 DIAGNOSIS — E1165 Type 2 diabetes mellitus with hyperglycemia: Secondary | ICD-10-CM | POA: Diagnosis not present

## 2021-07-23 DIAGNOSIS — I2510 Atherosclerotic heart disease of native coronary artery without angina pectoris: Secondary | ICD-10-CM | POA: Diagnosis not present

## 2021-07-23 DIAGNOSIS — I10 Essential (primary) hypertension: Secondary | ICD-10-CM | POA: Diagnosis not present

## 2021-07-23 DIAGNOSIS — R69 Illness, unspecified: Secondary | ICD-10-CM | POA: Diagnosis not present

## 2021-07-23 DIAGNOSIS — N170 Acute kidney failure with tubular necrosis: Secondary | ICD-10-CM | POA: Diagnosis not present

## 2021-07-23 DIAGNOSIS — I69354 Hemiplegia and hemiparesis following cerebral infarction affecting left non-dominant side: Secondary | ICD-10-CM | POA: Diagnosis not present

## 2021-07-23 DIAGNOSIS — E785 Hyperlipidemia, unspecified: Secondary | ICD-10-CM | POA: Diagnosis not present

## 2021-07-23 DIAGNOSIS — R569 Unspecified convulsions: Secondary | ICD-10-CM | POA: Diagnosis not present

## 2021-07-23 DIAGNOSIS — I69328 Other speech and language deficits following cerebral infarction: Secondary | ICD-10-CM | POA: Diagnosis not present

## 2021-07-23 DIAGNOSIS — J449 Chronic obstructive pulmonary disease, unspecified: Secondary | ICD-10-CM | POA: Diagnosis not present

## 2021-07-30 DIAGNOSIS — E1165 Type 2 diabetes mellitus with hyperglycemia: Secondary | ICD-10-CM | POA: Diagnosis not present

## 2021-07-30 DIAGNOSIS — N170 Acute kidney failure with tubular necrosis: Secondary | ICD-10-CM | POA: Diagnosis not present

## 2021-07-30 DIAGNOSIS — I69328 Other speech and language deficits following cerebral infarction: Secondary | ICD-10-CM | POA: Diagnosis not present

## 2021-07-30 DIAGNOSIS — I69354 Hemiplegia and hemiparesis following cerebral infarction affecting left non-dominant side: Secondary | ICD-10-CM | POA: Diagnosis not present

## 2021-07-30 DIAGNOSIS — J449 Chronic obstructive pulmonary disease, unspecified: Secondary | ICD-10-CM | POA: Diagnosis not present

## 2021-07-30 DIAGNOSIS — R69 Illness, unspecified: Secondary | ICD-10-CM | POA: Diagnosis not present

## 2021-07-30 DIAGNOSIS — I2510 Atherosclerotic heart disease of native coronary artery without angina pectoris: Secondary | ICD-10-CM | POA: Diagnosis not present

## 2021-07-30 DIAGNOSIS — R569 Unspecified convulsions: Secondary | ICD-10-CM | POA: Diagnosis not present

## 2021-07-30 DIAGNOSIS — I10 Essential (primary) hypertension: Secondary | ICD-10-CM | POA: Diagnosis not present

## 2021-07-30 DIAGNOSIS — E785 Hyperlipidemia, unspecified: Secondary | ICD-10-CM | POA: Diagnosis not present

## 2021-08-07 DIAGNOSIS — Z87891 Personal history of nicotine dependence: Secondary | ICD-10-CM | POA: Diagnosis not present

## 2021-08-07 DIAGNOSIS — R69 Illness, unspecified: Secondary | ICD-10-CM | POA: Diagnosis not present

## 2021-08-07 DIAGNOSIS — F17210 Nicotine dependence, cigarettes, uncomplicated: Secondary | ICD-10-CM | POA: Diagnosis not present

## 2021-08-07 DIAGNOSIS — F332 Major depressive disorder, recurrent severe without psychotic features: Secondary | ICD-10-CM | POA: Diagnosis not present

## 2021-08-07 DIAGNOSIS — F4310 Post-traumatic stress disorder, unspecified: Secondary | ICD-10-CM | POA: Diagnosis not present

## 2021-08-07 DIAGNOSIS — F411 Generalized anxiety disorder: Secondary | ICD-10-CM | POA: Diagnosis not present

## 2021-08-07 DIAGNOSIS — Z122 Encounter for screening for malignant neoplasm of respiratory organs: Secondary | ICD-10-CM | POA: Diagnosis not present

## 2021-08-10 DIAGNOSIS — E1169 Type 2 diabetes mellitus with other specified complication: Secondary | ICD-10-CM | POA: Diagnosis not present

## 2021-08-10 DIAGNOSIS — J449 Chronic obstructive pulmonary disease, unspecified: Secondary | ICD-10-CM | POA: Diagnosis not present

## 2021-08-14 DIAGNOSIS — R69 Illness, unspecified: Secondary | ICD-10-CM | POA: Diagnosis not present

## 2021-08-14 DIAGNOSIS — F332 Major depressive disorder, recurrent severe without psychotic features: Secondary | ICD-10-CM | POA: Diagnosis not present

## 2021-08-14 DIAGNOSIS — F411 Generalized anxiety disorder: Secondary | ICD-10-CM | POA: Diagnosis not present

## 2021-08-14 DIAGNOSIS — E1165 Type 2 diabetes mellitus with hyperglycemia: Secondary | ICD-10-CM | POA: Diagnosis not present

## 2021-08-14 DIAGNOSIS — Z139 Encounter for screening, unspecified: Secondary | ICD-10-CM | POA: Diagnosis not present

## 2021-08-14 DIAGNOSIS — E1169 Type 2 diabetes mellitus with other specified complication: Secondary | ICD-10-CM | POA: Diagnosis not present

## 2021-08-14 DIAGNOSIS — Z1331 Encounter for screening for depression: Secondary | ICD-10-CM | POA: Diagnosis not present

## 2021-08-14 DIAGNOSIS — J449 Chronic obstructive pulmonary disease, unspecified: Secondary | ICD-10-CM | POA: Diagnosis not present

## 2021-08-14 DIAGNOSIS — E785 Hyperlipidemia, unspecified: Secondary | ICD-10-CM | POA: Diagnosis not present

## 2021-08-14 DIAGNOSIS — F4310 Post-traumatic stress disorder, unspecified: Secondary | ICD-10-CM | POA: Diagnosis not present

## 2021-08-21 DIAGNOSIS — F4310 Post-traumatic stress disorder, unspecified: Secondary | ICD-10-CM | POA: Diagnosis not present

## 2021-08-21 DIAGNOSIS — U071 COVID-19: Secondary | ICD-10-CM | POA: Diagnosis not present

## 2021-08-21 DIAGNOSIS — F411 Generalized anxiety disorder: Secondary | ICD-10-CM | POA: Diagnosis not present

## 2021-08-21 DIAGNOSIS — R0603 Acute respiratory distress: Secondary | ICD-10-CM | POA: Diagnosis not present

## 2021-08-21 DIAGNOSIS — F332 Major depressive disorder, recurrent severe without psychotic features: Secondary | ICD-10-CM | POA: Diagnosis not present

## 2021-08-21 DIAGNOSIS — R69 Illness, unspecified: Secondary | ICD-10-CM | POA: Diagnosis not present

## 2021-08-28 DIAGNOSIS — F411 Generalized anxiety disorder: Secondary | ICD-10-CM | POA: Diagnosis not present

## 2021-08-28 DIAGNOSIS — R69 Illness, unspecified: Secondary | ICD-10-CM | POA: Diagnosis not present

## 2021-08-28 DIAGNOSIS — F332 Major depressive disorder, recurrent severe without psychotic features: Secondary | ICD-10-CM | POA: Diagnosis not present

## 2021-08-28 DIAGNOSIS — F4310 Post-traumatic stress disorder, unspecified: Secondary | ICD-10-CM | POA: Diagnosis not present

## 2021-08-30 DIAGNOSIS — I700 Atherosclerosis of aorta: Secondary | ICD-10-CM | POA: Diagnosis not present

## 2021-08-30 DIAGNOSIS — E1165 Type 2 diabetes mellitus with hyperglycemia: Secondary | ICD-10-CM | POA: Diagnosis not present

## 2021-08-30 DIAGNOSIS — Z794 Long term (current) use of insulin: Secondary | ICD-10-CM | POA: Diagnosis not present

## 2021-08-30 DIAGNOSIS — F17210 Nicotine dependence, cigarettes, uncomplicated: Secondary | ICD-10-CM | POA: Diagnosis not present

## 2021-08-30 DIAGNOSIS — R69 Illness, unspecified: Secondary | ICD-10-CM | POA: Diagnosis not present

## 2021-08-30 DIAGNOSIS — I25119 Atherosclerotic heart disease of native coronary artery with unspecified angina pectoris: Secondary | ICD-10-CM | POA: Diagnosis not present

## 2021-08-30 DIAGNOSIS — I10 Essential (primary) hypertension: Secondary | ICD-10-CM | POA: Diagnosis not present

## 2021-08-30 DIAGNOSIS — G40909 Epilepsy, unspecified, not intractable, without status epilepticus: Secondary | ICD-10-CM | POA: Diagnosis not present

## 2021-08-30 DIAGNOSIS — E1151 Type 2 diabetes mellitus with diabetic peripheral angiopathy without gangrene: Secondary | ICD-10-CM | POA: Diagnosis not present

## 2021-08-30 DIAGNOSIS — J439 Emphysema, unspecified: Secondary | ICD-10-CM | POA: Diagnosis not present

## 2021-08-30 DIAGNOSIS — E785 Hyperlipidemia, unspecified: Secondary | ICD-10-CM | POA: Diagnosis not present

## 2021-08-30 DIAGNOSIS — Z008 Encounter for other general examination: Secondary | ICD-10-CM | POA: Diagnosis not present

## 2021-09-09 DIAGNOSIS — H5203 Hypermetropia, bilateral: Secondary | ICD-10-CM | POA: Diagnosis not present

## 2021-09-09 DIAGNOSIS — Z7984 Long term (current) use of oral hypoglycemic drugs: Secondary | ICD-10-CM | POA: Diagnosis not present

## 2021-09-09 DIAGNOSIS — Z9841 Cataract extraction status, right eye: Secondary | ICD-10-CM | POA: Diagnosis not present

## 2021-09-09 DIAGNOSIS — H52223 Regular astigmatism, bilateral: Secondary | ICD-10-CM | POA: Diagnosis not present

## 2021-09-09 DIAGNOSIS — I10 Essential (primary) hypertension: Secondary | ICD-10-CM | POA: Diagnosis not present

## 2021-09-09 DIAGNOSIS — Z961 Presence of intraocular lens: Secondary | ICD-10-CM | POA: Diagnosis not present

## 2021-09-09 DIAGNOSIS — Z794 Long term (current) use of insulin: Secondary | ICD-10-CM | POA: Diagnosis not present

## 2021-09-09 DIAGNOSIS — H524 Presbyopia: Secondary | ICD-10-CM | POA: Diagnosis not present

## 2021-09-09 DIAGNOSIS — H26492 Other secondary cataract, left eye: Secondary | ICD-10-CM | POA: Diagnosis not present

## 2021-09-09 DIAGNOSIS — E119 Type 2 diabetes mellitus without complications: Secondary | ICD-10-CM | POA: Diagnosis not present

## 2021-09-10 DIAGNOSIS — J449 Chronic obstructive pulmonary disease, unspecified: Secondary | ICD-10-CM | POA: Diagnosis not present

## 2021-09-10 DIAGNOSIS — E1169 Type 2 diabetes mellitus with other specified complication: Secondary | ICD-10-CM | POA: Diagnosis not present

## 2021-09-10 DIAGNOSIS — E785 Hyperlipidemia, unspecified: Secondary | ICD-10-CM | POA: Diagnosis not present

## 2021-09-21 DIAGNOSIS — U071 COVID-19: Secondary | ICD-10-CM | POA: Diagnosis not present

## 2021-09-21 DIAGNOSIS — R0603 Acute respiratory distress: Secondary | ICD-10-CM | POA: Diagnosis not present

## 2021-09-25 DIAGNOSIS — R69 Illness, unspecified: Secondary | ICD-10-CM | POA: Diagnosis not present

## 2021-09-25 DIAGNOSIS — F411 Generalized anxiety disorder: Secondary | ICD-10-CM | POA: Diagnosis not present

## 2021-09-25 DIAGNOSIS — F332 Major depressive disorder, recurrent severe without psychotic features: Secondary | ICD-10-CM | POA: Diagnosis not present

## 2021-09-25 DIAGNOSIS — F4310 Post-traumatic stress disorder, unspecified: Secondary | ICD-10-CM | POA: Diagnosis not present

## 2021-10-08 DIAGNOSIS — E1169 Type 2 diabetes mellitus with other specified complication: Secondary | ICD-10-CM | POA: Diagnosis not present

## 2021-10-08 DIAGNOSIS — J449 Chronic obstructive pulmonary disease, unspecified: Secondary | ICD-10-CM | POA: Diagnosis not present

## 2021-10-19 DIAGNOSIS — R0603 Acute respiratory distress: Secondary | ICD-10-CM | POA: Diagnosis not present

## 2021-10-19 DIAGNOSIS — U071 COVID-19: Secondary | ICD-10-CM | POA: Diagnosis not present

## 2021-11-08 DIAGNOSIS — E1169 Type 2 diabetes mellitus with other specified complication: Secondary | ICD-10-CM | POA: Diagnosis not present

## 2021-11-14 DIAGNOSIS — I10 Essential (primary) hypertension: Secondary | ICD-10-CM | POA: Diagnosis not present

## 2021-11-14 DIAGNOSIS — E1169 Type 2 diabetes mellitus with other specified complication: Secondary | ICD-10-CM | POA: Diagnosis not present

## 2021-11-19 DIAGNOSIS — U071 COVID-19: Secondary | ICD-10-CM | POA: Diagnosis not present

## 2021-11-19 DIAGNOSIS — R0603 Acute respiratory distress: Secondary | ICD-10-CM | POA: Diagnosis not present

## 2021-12-03 DIAGNOSIS — N1831 Chronic kidney disease, stage 3a: Secondary | ICD-10-CM | POA: Diagnosis not present

## 2021-12-03 DIAGNOSIS — E785 Hyperlipidemia, unspecified: Secondary | ICD-10-CM | POA: Diagnosis not present

## 2021-12-03 DIAGNOSIS — E1169 Type 2 diabetes mellitus with other specified complication: Secondary | ICD-10-CM | POA: Diagnosis not present

## 2021-12-03 DIAGNOSIS — I10 Essential (primary) hypertension: Secondary | ICD-10-CM | POA: Diagnosis not present

## 2021-12-03 DIAGNOSIS — Z23 Encounter for immunization: Secondary | ICD-10-CM | POA: Diagnosis not present

## 2021-12-08 DIAGNOSIS — E1169 Type 2 diabetes mellitus with other specified complication: Secondary | ICD-10-CM | POA: Diagnosis not present

## 2021-12-15 DIAGNOSIS — R9389 Abnormal findings on diagnostic imaging of other specified body structures: Secondary | ICD-10-CM | POA: Diagnosis not present

## 2021-12-15 DIAGNOSIS — M542 Cervicalgia: Secondary | ICD-10-CM | POA: Diagnosis not present

## 2021-12-15 DIAGNOSIS — M47812 Spondylosis without myelopathy or radiculopathy, cervical region: Secondary | ICD-10-CM | POA: Diagnosis not present

## 2021-12-15 DIAGNOSIS — R110 Nausea: Secondary | ICD-10-CM | POA: Diagnosis not present

## 2021-12-15 DIAGNOSIS — E785 Hyperlipidemia, unspecified: Secondary | ICD-10-CM | POA: Diagnosis not present

## 2021-12-15 DIAGNOSIS — E1169 Type 2 diabetes mellitus with other specified complication: Secondary | ICD-10-CM | POA: Diagnosis not present

## 2021-12-19 DIAGNOSIS — R0603 Acute respiratory distress: Secondary | ICD-10-CM | POA: Diagnosis not present

## 2021-12-19 DIAGNOSIS — U071 COVID-19: Secondary | ICD-10-CM | POA: Diagnosis not present

## 2021-12-22 DIAGNOSIS — M899 Disorder of bone, unspecified: Secondary | ICD-10-CM | POA: Diagnosis not present

## 2021-12-23 DIAGNOSIS — M899 Disorder of bone, unspecified: Secondary | ICD-10-CM | POA: Diagnosis not present

## 2021-12-29 DIAGNOSIS — G3184 Mild cognitive impairment, so stated: Secondary | ICD-10-CM | POA: Diagnosis not present

## 2021-12-29 DIAGNOSIS — I6782 Cerebral ischemia: Secondary | ICD-10-CM | POA: Diagnosis not present

## 2021-12-29 DIAGNOSIS — I639 Cerebral infarction, unspecified: Secondary | ICD-10-CM | POA: Diagnosis not present

## 2022-01-08 DIAGNOSIS — I10 Essential (primary) hypertension: Secondary | ICD-10-CM | POA: Diagnosis not present

## 2022-01-08 DIAGNOSIS — E1169 Type 2 diabetes mellitus with other specified complication: Secondary | ICD-10-CM | POA: Diagnosis not present

## 2022-01-19 DIAGNOSIS — R0603 Acute respiratory distress: Secondary | ICD-10-CM | POA: Diagnosis not present

## 2022-01-19 DIAGNOSIS — U071 COVID-19: Secondary | ICD-10-CM | POA: Diagnosis not present

## 2022-01-27 DIAGNOSIS — Z1231 Encounter for screening mammogram for malignant neoplasm of breast: Secondary | ICD-10-CM | POA: Diagnosis not present

## 2022-01-27 DIAGNOSIS — M810 Age-related osteoporosis without current pathological fracture: Secondary | ICD-10-CM | POA: Diagnosis not present

## 2022-02-07 DIAGNOSIS — I10 Essential (primary) hypertension: Secondary | ICD-10-CM | POA: Diagnosis not present

## 2022-02-07 DIAGNOSIS — E1169 Type 2 diabetes mellitus with other specified complication: Secondary | ICD-10-CM | POA: Diagnosis not present

## 2022-02-11 DIAGNOSIS — N6322 Unspecified lump in the left breast, upper inner quadrant: Secondary | ICD-10-CM | POA: Diagnosis not present

## 2022-02-11 DIAGNOSIS — R928 Other abnormal and inconclusive findings on diagnostic imaging of breast: Secondary | ICD-10-CM | POA: Diagnosis not present

## 2022-02-12 DIAGNOSIS — R928 Other abnormal and inconclusive findings on diagnostic imaging of breast: Secondary | ICD-10-CM | POA: Diagnosis not present

## 2022-02-12 DIAGNOSIS — E1169 Type 2 diabetes mellitus with other specified complication: Secondary | ICD-10-CM | POA: Diagnosis not present

## 2022-02-12 DIAGNOSIS — N6322 Unspecified lump in the left breast, upper inner quadrant: Secondary | ICD-10-CM | POA: Diagnosis not present

## 2022-02-12 DIAGNOSIS — C50212 Malignant neoplasm of upper-inner quadrant of left female breast: Secondary | ICD-10-CM | POA: Diagnosis not present

## 2022-02-16 NOTE — Progress Notes (Signed)
Initial phone contact with pt's daughter. Pt is hard of hearing so daughter is communicating on her behalf. Family requests Dr Noberto Retort and Hinton Rao. Pt's daughter has had breast cancer in the past and these were her providers. Appt made with [facility] for 02/24/2022 at10:30. Pt aware and records faxed. Encouraged pt's daughter to stop by and pick up a copy of "The Breast Cancer Treatment Handbook" at the [facility]. Encouraged family to call with questions or concerns.

## 2022-02-18 DIAGNOSIS — U071 COVID-19: Secondary | ICD-10-CM | POA: Diagnosis not present

## 2022-02-18 DIAGNOSIS — E1169 Type 2 diabetes mellitus with other specified complication: Secondary | ICD-10-CM | POA: Diagnosis not present

## 2022-02-18 DIAGNOSIS — N1831 Chronic kidney disease, stage 3a: Secondary | ICD-10-CM | POA: Diagnosis not present

## 2022-02-18 DIAGNOSIS — C50912 Malignant neoplasm of unspecified site of left female breast: Secondary | ICD-10-CM | POA: Diagnosis not present

## 2022-02-18 DIAGNOSIS — R0603 Acute respiratory distress: Secondary | ICD-10-CM | POA: Diagnosis not present

## 2022-02-18 DIAGNOSIS — E785 Hyperlipidemia, unspecified: Secondary | ICD-10-CM | POA: Diagnosis not present

## 2022-02-24 DIAGNOSIS — Z171 Estrogen receptor negative status [ER-]: Secondary | ICD-10-CM | POA: Diagnosis not present

## 2022-02-24 DIAGNOSIS — C50212 Malignant neoplasm of upper-inner quadrant of left female breast: Secondary | ICD-10-CM | POA: Diagnosis not present

## 2022-03-10 DIAGNOSIS — E1169 Type 2 diabetes mellitus with other specified complication: Secondary | ICD-10-CM | POA: Diagnosis not present

## 2022-03-10 DIAGNOSIS — I10 Essential (primary) hypertension: Secondary | ICD-10-CM | POA: Diagnosis not present

## 2022-03-18 ENCOUNTER — Encounter: Payer: Self-pay | Admitting: Cardiology

## 2022-03-18 DIAGNOSIS — I498 Other specified cardiac arrhythmias: Secondary | ICD-10-CM | POA: Diagnosis not present

## 2022-03-18 DIAGNOSIS — J449 Chronic obstructive pulmonary disease, unspecified: Secondary | ICD-10-CM | POA: Diagnosis not present

## 2022-03-18 DIAGNOSIS — C50412 Malignant neoplasm of upper-outer quadrant of left female breast: Secondary | ICD-10-CM | POA: Diagnosis not present

## 2022-03-18 DIAGNOSIS — C50912 Malignant neoplasm of unspecified site of left female breast: Secondary | ICD-10-CM | POA: Diagnosis not present

## 2022-03-18 DIAGNOSIS — F17210 Nicotine dependence, cigarettes, uncomplicated: Secondary | ICD-10-CM | POA: Diagnosis not present

## 2022-03-18 DIAGNOSIS — I4510 Unspecified right bundle-branch block: Secondary | ICD-10-CM | POA: Diagnosis not present

## 2022-03-18 DIAGNOSIS — C50212 Malignant neoplasm of upper-inner quadrant of left female breast: Secondary | ICD-10-CM | POA: Diagnosis not present

## 2022-03-18 DIAGNOSIS — I700 Atherosclerosis of aorta: Secondary | ICD-10-CM | POA: Diagnosis not present

## 2022-03-18 DIAGNOSIS — I340 Nonrheumatic mitral (valve) insufficiency: Secondary | ICD-10-CM | POA: Diagnosis not present

## 2022-03-18 DIAGNOSIS — Z452 Encounter for adjustment and management of vascular access device: Secondary | ICD-10-CM | POA: Diagnosis not present

## 2022-03-18 DIAGNOSIS — I959 Hypotension, unspecified: Secondary | ICD-10-CM | POA: Diagnosis not present

## 2022-03-19 ENCOUNTER — Encounter: Payer: Self-pay | Admitting: Cardiology

## 2022-03-19 DIAGNOSIS — J449 Chronic obstructive pulmonary disease, unspecified: Secondary | ICD-10-CM | POA: Diagnosis not present

## 2022-03-19 DIAGNOSIS — F17210 Nicotine dependence, cigarettes, uncomplicated: Secondary | ICD-10-CM | POA: Diagnosis not present

## 2022-03-19 DIAGNOSIS — I4510 Unspecified right bundle-branch block: Secondary | ICD-10-CM | POA: Diagnosis not present

## 2022-03-19 DIAGNOSIS — C50212 Malignant neoplasm of upper-inner quadrant of left female breast: Secondary | ICD-10-CM | POA: Diagnosis not present

## 2022-03-21 DIAGNOSIS — U071 COVID-19: Secondary | ICD-10-CM | POA: Diagnosis not present

## 2022-03-21 DIAGNOSIS — R0603 Acute respiratory distress: Secondary | ICD-10-CM | POA: Diagnosis not present

## 2022-04-10 DIAGNOSIS — E1169 Type 2 diabetes mellitus with other specified complication: Secondary | ICD-10-CM | POA: Diagnosis not present

## 2022-04-10 DIAGNOSIS — I10 Essential (primary) hypertension: Secondary | ICD-10-CM | POA: Diagnosis not present

## 2022-04-21 DIAGNOSIS — U071 COVID-19: Secondary | ICD-10-CM | POA: Diagnosis not present

## 2022-04-21 DIAGNOSIS — R0603 Acute respiratory distress: Secondary | ICD-10-CM | POA: Diagnosis not present

## 2022-04-22 DIAGNOSIS — N390 Urinary tract infection, site not specified: Secondary | ICD-10-CM | POA: Diagnosis not present

## 2022-04-22 DIAGNOSIS — R300 Dysuria: Secondary | ICD-10-CM | POA: Diagnosis not present

## 2022-04-23 NOTE — Progress Notes (Signed)
Lindsay Brooks  [address] [place],  [place]  [address] [phone]  Clinic Day:  04/28/2022  Referring physician: Marylu Lund., MD   ASSESSMENT & PLAN:  Stage IA Left breast cancer This is a grade 3 triple negative 21 mm tumor for a T1cN0M0 with a Ki-67 of 40%. She has had resection with clear margins and negative sentinel node. However, I do recommend adjuvant chemotherapy and she is agreeable. Given her age and comorbidities, I will likely go with docetaxol and cytoxan every 3 weeks for 4 cycles and avoid anthracyclines.  She is likely BRCA1 positive since her daughter is, so she also may be a candidate for olaparib after chemotherapy. I do recommend radiation and she will see Dr.Palermo today.   We will schedule a CT of the chest to follow up on prior lung nodule and bone scan. She already has a port placed and so we can start soon but will plan chemotherapy education first.  She is appropriate for genetic testing, especially of BRCA1. I will therefore refer her to the genetics clinic. We will plan close follow up during chemotherapy. I discussed the assessment and treatment plan with the patient and her daughters.  The patient was provided an opportunity to ask questions and all were answered.  The patient agreed with the plan and demonstrated an understanding of the instructions.  The patient was advised to call back if the symptoms worsen or if the condition fails to improve as anticipated.  Thank you for the opportunity for participating in the care of your patients.  I provided 60 minutes of face-to-face time during this this encounter and > 50% was spent counseling as documented under my assessment and plan.   Derwood Kaplan, MD [facility] AT [facility] [address] [place] [address] Dept: [phone] Dept Fax: [phone]     I have reviewed this report as typed by the  medical scribe, and it is complete and accurate.  Derwood Kaplan, MD   9/23/20231:41 PM  CHIEF COMPLAINT:  CC: Invasive ductal carcinoma  Current Treatment:  chemotherapy   HISTORY OF PRESENT ILLNESS:   Oncology History  Breast cancer of upper-outer quadrant of left female breast ([place])  03/18/2022 Initial Diagnosis   Breast cancer of upper-outer quadrant of left female breast ([place])   04/28/2022 Cancer Staging   Staging form: Breast, AJCC 8th Edition - Clinical stage from 04/28/2022: Stage IB (cT1c, cN0(sn), cM0, G3, ER-, PR-, HER2-) - Signed by Derwood Kaplan, MD on 04/28/2022 Histopathologic type: Infiltrating duct carcinoma, NOS Stage prefix: Initial diagnosis Method of lymph node assessment: Sentinel lymph node biopsy Nuclear grade: G3 Multigene prognostic tests performed: None Histologic grading system: 3 grade system Laterality: Left Tumor size (mm): 20 Lymph-vascular invasion (LVI): LVI not present (absent)/not identified Diagnostic confirmation: Positive histology Specimen type: Excision Staged by: Managing physician Menopausal status: Postmenopausal Ki-67 (%): 40 Stage used in treatment planning: Yes National guidelines used in treatment planning: Yes Type of national guideline used in treatment planning: NCCN   05/07/2022 -  Chemotherapy   Patient is on Treatment Plan : BREAST TC q21d     Lindsay Brooks is a 71 year old woman who had a screening mammogram on 01/27/2022 and was found to have a possible left breast mass. She does get yearly mammograms due to her strong family history. She thought  she could feel a lump after this was found. She had a  diagnostic mammogram on 02/11/2022 with a persistent spiculated hyperdense mass with associated calcifications in the upper inner quadrant. Ultrasound confirmed an irregular hypoechoic mass with peripheral vascularity at 11:30, 3 cm from the nipple. This measures 2.2 cm and the left axilla was negative. She was referred to  [name] after a biopsy was done on 02/17/2022, which confirmed a grade 3 invasive ductal carcinoma which is triple negative with a Ki-67 of 40%. [name] took her to surgery on 03/18/2022 for lumpectomy and sentinel lymph node. The final pathology revealed a 20 mm grade 3 invasive ductal carcinoma with DCIS. The margins were clear and the lymph node was negative for a T1cN0M0, stage IA. She has had a port placed.   INTERVAL HISTORY:  Lindsay Brooks is seen here today for recently diagnosed invasive ductal carcinoma. She does complete annual mammograms every year. She retired from he last job due to an injury which was in her left shoulder down to her forearm. She had a recent fall, with a large bruise on her left knee. She is healing well from her recent surgery. She is going to be started on prolia injections for her osteoporosis. She has pseudoseizures and had one during her examination, when she sat up quickly. We will schedule a CT chest and bone density scan for staging. She denies fever, chills, night sweats, or other signs of infection. She denies cardiorespiratory and gastrointestinal issues. She  denies pain. Her appetite is good. Her weight today is at 140 pounds. Blood counts are normal. Chemistries are normal.   SOCIAL HISTORY She lives with her first daughter, who is present at today's visit.  Her 2 daughters are present. She smokes 1.5 packs a day. Has been smoking since 71 yo. She is not sure about quitting. She did smoke 2 packs per day for over 50 years. She doesn't drink other than rare occasion She is widowed She has been married 4 times She is disabled  Menstrual History 71 years old when she had her first child She has had 3 children 47 years old when starting menstrual period Menopause started at 42 She has had a partial hysterectomy in her 78's and went back for complete hysterectomy/BSO later. No hormone replacement therapy  FAMILY HISTORY Daughter had breast cancer at age  29 Aunt had breast cancer at age 63 Her sister had a brain tumor at age 66   SURGICAL HISTORY She had a partial Hysterectomy in her late 69's, and went back a few years later to have BSO. Tonsillectomy cataract surgery  Stents of the iliac and femoral arteries. Nerve repair of the left upper extremity  PAST MEDICAL HISTORY Osteoporosis Coronary artery disease COPD Diabetes  Migraine headaches Hyperlipidemia Hypertension TIA Anxiety GERD Peripheral vascular disease Hard of hearing    REVIEW OF SYSTEMS:  Review of Systems  Constitutional: Negative.   HENT:  Negative.    Eyes: Negative.   Respiratory: Negative.    Gastrointestinal: Negative.   Endocrine: Negative.   Musculoskeletal: Negative.   Skin: Negative.   Neurological: Negative.        There is a question of possible early dementia since she has memory impairment  Hematological: Negative.   Psychiatric/Behavioral: Negative.      VITALS:  Blood pressure (!) 141/81, pulse (!) 111, temperature 98.1 F (36.7 C), temperature source Oral, resp. rate 18, height 5' 1.7" (1.567 m), weight 140 lb 12.8 oz (63.9 kg), SpO2 95 %.  Wt Readings from Last 3 Encounters:  04/30/22 138 lb 14.4  oz (63 kg)  04/28/22 140 lb 12.8 oz (63.9 kg)  12/23/18 145 lb 4.5 oz (65.9 kg)    Body mass index is 26 kg/m.  Performance status (ECOG): 0 - Asymptomatic  PHYSICAL EXAM:  Physical Exam Constitutional:      General: She is not in acute distress.    Appearance: Normal appearance. She is normal weight. She is not ill-appearing or toxic-appearing.  HENT:     Head: Normocephalic and atraumatic.  Eyes:     General: No scleral icterus.    Extraocular Movements: Extraocular movements intact.     Conjunctiva/sclera: Conjunctivae normal.     Pupils: Pupils are equal, round, and reactive to light.  Cardiovascular:     Rate and Rhythm: Normal rate and regular rhythm.     Pulses: Normal pulses.     Heart sounds: Normal heart sounds.  No murmur heard.    No friction rub. No gallop.  Pulmonary:     Effort: Pulmonary effort is normal. No respiratory distress.     Breath sounds: Normal breath sounds. No wheezing or rales.  Chest:     Chest wall: No mass.  Breasts:    Right: No mass.     Left: No mass.     Comments: Dye injection at 12 and 3 o'clock reaction at the other two die injections at 9 and 6 o'clock incision is healing very well and is slightly in the upper inner quadrant. Another incision in low left axilla which is healing well.  Port site is healing well in the right upper chest. Abdominal:     General: Bowel sounds are normal. There is no distension.     Palpations: Abdomen is soft. There is no hepatomegaly, splenomegaly or mass.     Tenderness: There is no abdominal tenderness.  Musculoskeletal:        General: Normal range of motion.     Cervical back: Normal range of motion and neck supple.     Right lower leg: No edema.     Left lower leg: No edema.  Lymphadenopathy:     Cervical: No cervical adenopathy.  Skin:    General: Skin is warm and dry.  Neurological:     General: No focal deficit present.     Mental Status: She is alert and oriented to person, place, and time. Mental status is at baseline.  Psychiatric:        Mood and Affect: Mood normal.        Behavior: Behavior normal.        Thought Content: Thought content normal.        Judgment: Judgment normal.    LABS:      Latest Ref Rng & Units 04/30/2022   12:00 AM 04/28/2022   12:00 AM 12/23/2018    9:39 PM  CBC  WBC  10.3     8.9     9.0   Hemoglobin 12.0 - 16.0 13.9     13.7     13.6    13.3   Hematocrit 36 - 46 42     41     40.0    41.4   Platelets 150 - 400 K/uL 215     213     226      This result is from an external source.   Multiple values from one day are sorted in reverse-chronological order      Latest Ref Rng & Units 04/30/2022   12:00 AM 04/28/2022  12:00 AM 12/23/2018    9:39 PM  CMP  Glucose 70 - 99  mg/dL 70 - 99 mg/dL   179    175   BUN 4 - $R'21 11     16     'Ww$ 35    36   Creatinine 0.5 - 1.1 1.1     1.1     1.30    1.22   Sodium 137 - 147 134     135     138    139   Potassium 3.5 - 5.1 mEq/L 3.4     3.7     3.2    3.4   Chloride 99 - 108 100     98     107    106   CO2 13 - $Re'22 26     28     17   'WnB$ Calcium 8.7 - 10.7 9.3     9.5     9.4   Total Protein 6.5 - 8.1 g/dL   6.3   Total Bilirubin 0.3 - 1.2 mg/dL   0.5   Alkaline Phos 25 - 125 89     87     44   AST 13 - 35 $Re'21     21     18   'tzf$ ALT 7 - 35 U/L $Remo'16     18     13      'Ppkga$ This result is from an external source.   Multiple values from one day are sorted in reverse-chronological order     No results found for: "CEA1", "CEA" / No results found for: "CEA1", "CEA" No results found for: "PSA1" No results found for: "IWP809" No results found for: "CAN125"  No results found for: "TOTALPROTELP", "ALBUMINELP", "A1GS", "A2GS", "BETS", "BETA2SER", "GAMS", "MSPIKE", "SPEI" No results found for: "TIBC", "FERRITIN", "IRONPCTSAT" No results found for: "LDH"   STUDIES:   EXAM:03/18/2022 REPORT SURGICAL PATHOLOGY DIAGNOSIS Breast, lumpectomy, Left UOQ Invasive ductal carcinoma, grade 3, and ductal carcinoma in situ Ribbon clip and biopsy site present All margins negative for invasive carcinoma and DCIS Fibrocystic changes Lymph node, sentinel, biopsy One lymph node negative for tumor (0/1)  Estrogen receptor: 0% Progesterone receptor: 0% HER2 negative at 1+ Proliferation marker Ki-67: 40%  Bone density scan 01/27/2022 1.  Osteoporosis of the spine with a T score of -2.5, worse 2.  Left femur - osteoporosis with a T score of -2.7, slightly better 3.  Dual femur total mean-osteoporosis with a T score of -2.5, worse   Allergies:  Allergies  Allergen Reactions   Amoxicillin Hives    vomiting   Celecoxib Hives   Insulins Nausea And Vomiting    Patient unsure of name of insulin, will call us back   Vioxx [Rofecoxib] Hives    Liraglutide Nausea And Vomiting   Propranolol Nausea And Vomiting    diarrhea diarrhea diarrhea     Current Medications: Current Outpatient Medications  Medication Sig Dispense Refill   aspirin EC 81 MG tablet Take 81 mg by mouth daily.     atenolol (TENORMIN) 50 MG tablet Take 75 mg by mouth 2 (two) times daily.      clopidogrel (PLAVIX) 75 MG tablet Take 75 mg by mouth daily at 12 noon.      DULoxetine (CYMBALTA) 60 MG capsule Take 120 mg by mouth at bedtime.      fenofibrate (TRICOR) 145 MG tablet Take 145 mg by mouth daily.  fexofenadine (ALLEGRA) 180 MG tablet Take 180 mg by mouth daily.     gabapentin (NEURONTIN) 600 MG tablet Take 600 mg by mouth 3 (three) times daily.     hydrALAZINE (APRESOLINE) 50 MG tablet Take 50 mg by mouth 3 (three) times daily.     Melatonin 1 MG CAPS Take by mouth. (Patient not taking: Reported on 04/30/2022)     metoprolol tartrate (LOPRESSOR) 25 MG tablet Take 25 mg by mouth 2 (two) times daily.     nitroGLYCERIN (NITROSTAT) 0.4 MG SL tablet Place 0.4 mg under the tongue every 5 (five) minutes as needed. For chest pain     pantoprazole (PROTONIX) 40 MG tablet Take 40 mg by mouth 2 (two) times daily.     tirzepatide (MOUNJARO) 2.5 MG/0.5ML Pen INJECT 2.5 MILLIGRAM EVERY WEEK     valsartan-hydrochlorothiazide (DIOVAN-HCT) 320-25 MG tablet TAKE 1 TABLET BY ORAL ROUTE ONCE DAILY FOR HIGH BLOOD PRESSURE     zolpidem (AMBIEN) 10 MG tablet Take 10 mg by mouth at bedtime as needed for sleep.  (Patient not taking: Reported on 04/30/2022)     busPIRone (BUSPAR) 7.5 MG tablet Take 1 tablet by mouth in the morning, at noon, and at bedtime.     dexamethasone (DECADRON) 4 MG tablet Take 2 tabs by mouth 2 times daily starting day before chemo. Then take 2 tabs daily for 2 days starting day after chemo. Take with food. 30 tablet 1   famotidine (PEPCID) 20 MG tablet Take 20 mg by mouth 2 (two) times daily.     LORazepam (ATIVAN) 0.5 MG tablet Take 1 tablet (0.5 mg  total) by mouth at bedtime as needed for sleep. May take one tab prior to imaging studies due to anxiety 30 tablet 0   nicotine (NICODERM CQ - DOSED IN MG/24 HOURS) 21 mg/24hr patch Place 1 patch (21 mg total) onto the skin daily. 28 patch 0   ondansetron (ZOFRAN-ODT) 4 MG disintegrating tablet Take 4 mg by mouth every 6 (six) hours as needed.     prochlorperazine (COMPAZINE) 10 MG tablet Take 1 tablet (10 mg total) by mouth every 6 (six) hours as needed for nausea or vomiting. 30 tablet 1   No current facility-administered medications for this visit.    I,Gabriella Ballesteros,acting as a scribe for Derwood Kaplan, MD.,have documented all relevant documentation on the behalf of Derwood Kaplan, MD,as directed by  Derwood Kaplan, MD while in the presence of Derwood Kaplan, MD.

## 2022-04-27 ENCOUNTER — Other Ambulatory Visit: Payer: Self-pay | Admitting: Oncology

## 2022-04-27 DIAGNOSIS — C50412 Malignant neoplasm of upper-outer quadrant of left female breast: Secondary | ICD-10-CM

## 2022-04-28 ENCOUNTER — Other Ambulatory Visit: Payer: Self-pay | Admitting: Oncology

## 2022-04-28 ENCOUNTER — Encounter: Payer: Self-pay | Admitting: Oncology

## 2022-04-28 ENCOUNTER — Inpatient Hospital Stay: Payer: Medicare Other

## 2022-04-28 ENCOUNTER — Inpatient Hospital Stay: Payer: Medicare Other | Attending: Oncology | Admitting: Oncology

## 2022-04-28 VITALS — BP 141/81 | HR 111 | Temp 98.1°F | Resp 18 | Ht 61.7 in | Wt 140.8 lb

## 2022-04-28 DIAGNOSIS — Z5111 Encounter for antineoplastic chemotherapy: Secondary | ICD-10-CM | POA: Insufficient documentation

## 2022-04-28 DIAGNOSIS — F17210 Nicotine dependence, cigarettes, uncomplicated: Secondary | ICD-10-CM | POA: Insufficient documentation

## 2022-04-28 DIAGNOSIS — Z171 Estrogen receptor negative status [ER-]: Secondary | ICD-10-CM | POA: Diagnosis not present

## 2022-04-28 DIAGNOSIS — Z170 Estrogen receptor positive status [ER+]: Secondary | ICD-10-CM | POA: Insufficient documentation

## 2022-04-28 DIAGNOSIS — Z7952 Long term (current) use of systemic steroids: Secondary | ICD-10-CM | POA: Insufficient documentation

## 2022-04-28 DIAGNOSIS — Z5189 Encounter for other specified aftercare: Secondary | ICD-10-CM | POA: Insufficient documentation

## 2022-04-28 DIAGNOSIS — C50412 Malignant neoplasm of upper-outer quadrant of left female breast: Secondary | ICD-10-CM | POA: Insufficient documentation

## 2022-04-28 DIAGNOSIS — Z79899 Other long term (current) drug therapy: Secondary | ICD-10-CM | POA: Insufficient documentation

## 2022-04-28 DIAGNOSIS — Z7982 Long term (current) use of aspirin: Secondary | ICD-10-CM | POA: Insufficient documentation

## 2022-04-28 DIAGNOSIS — R911 Solitary pulmonary nodule: Secondary | ICD-10-CM

## 2022-04-28 DIAGNOSIS — Z7902 Long term (current) use of antithrombotics/antiplatelets: Secondary | ICD-10-CM | POA: Insufficient documentation

## 2022-04-28 LAB — BASIC METABOLIC PANEL
BUN: 16 (ref 4–21)
CO2: 28 — AB (ref 13–22)
Chloride: 98 — AB (ref 99–108)
Creatinine: 1.1 (ref 0.5–1.1)
Glucose: 130
Potassium: 3.7 mEq/L (ref 3.5–5.1)
Sodium: 135 — AB (ref 137–147)

## 2022-04-28 LAB — CBC: RBC: 4.44 (ref 3.87–5.11)

## 2022-04-28 LAB — CBC AND DIFFERENTIAL
HCT: 41 (ref 36–46)
Hemoglobin: 13.7 (ref 12.0–16.0)
Neutrophils Absolute: 5.07
Platelets: 213 10*3/uL (ref 150–400)
WBC: 8.9

## 2022-04-28 LAB — COMPREHENSIVE METABOLIC PANEL
Albumin: 4.1 (ref 3.5–5.0)
Calcium: 9.5 (ref 8.7–10.7)

## 2022-04-28 LAB — HEPATIC FUNCTION PANEL
ALT: 18 U/L (ref 7–35)
AST: 21 (ref 13–35)
Alkaline Phosphatase: 87 (ref 25–125)
Bilirubin, Total: 0.3

## 2022-04-28 NOTE — Progress Notes (Signed)
START ON PATHWAY REGIMEN - Breast ? ? ?  A cycle is every 21 days: ?    Docetaxel  ?    Cyclophosphamide  ? ?**Always confirm dose/schedule in your pharmacy ordering system** ? ?Patient Characteristics: ?Postoperative without Neoadjuvant Therapy (Pathologic Staging), Invasive Disease, Adjuvant Therapy, HER2 Negative/Unknown/Equivocal, ER Negative/Unknown, Node Negative, pT1a-c, N1mi or pT1c or Higher, pN0 ?Therapeutic Status: Postoperative without Neoadjuvant Therapy (Pathologic Staging) ?AJCC Grade: G3 ?AJCC N Category: pN0 ?AJCC M Category: cM0 ?ER Status: Negative (-) ?AJCC 8 Stage Grouping: IB ?HER2 Status: Negative (-) ?Oncotype Dx Recurrence Score: Not Appropriate ?AJCC T Category: pT1c ?PR Status: Negative (-) ?Adjuvant Therapy Status: No Adjuvant Therapy Received Yet or Changing Initial Adjuvant Regimen due to Tolerance ?Intent of Therapy: ?Curative Intent, Discussed with Patient ?

## 2022-04-28 NOTE — Progress Notes (Signed)
Face to face visit with pt and daughter in [place]. Pt has completed her surgery and said she had no problems. Pt is here today for her Medical Oncology visit to discuss further treatment plans.

## 2022-04-29 ENCOUNTER — Other Ambulatory Visit: Payer: Self-pay

## 2022-04-30 ENCOUNTER — Inpatient Hospital Stay: Payer: Medicare Other

## 2022-04-30 ENCOUNTER — Other Ambulatory Visit: Payer: Self-pay

## 2022-04-30 ENCOUNTER — Encounter: Payer: Self-pay | Admitting: Oncology

## 2022-04-30 ENCOUNTER — Encounter: Payer: Self-pay | Admitting: Hematology and Oncology

## 2022-04-30 ENCOUNTER — Inpatient Hospital Stay (INDEPENDENT_AMBULATORY_CARE_PROVIDER_SITE_OTHER): Payer: Medicare Other | Admitting: Hematology and Oncology

## 2022-04-30 VITALS — BP 114/57 | HR 109 | Temp 98.0°F | Resp 18 | Ht 61.3 in | Wt 138.9 lb

## 2022-04-30 DIAGNOSIS — Z171 Estrogen receptor negative status [ER-]: Secondary | ICD-10-CM

## 2022-04-30 DIAGNOSIS — Z803 Family history of malignant neoplasm of breast: Secondary | ICD-10-CM | POA: Diagnosis not present

## 2022-04-30 DIAGNOSIS — C50412 Malignant neoplasm of upper-outer quadrant of left female breast: Secondary | ICD-10-CM | POA: Diagnosis not present

## 2022-04-30 DIAGNOSIS — D649 Anemia, unspecified: Secondary | ICD-10-CM | POA: Diagnosis not present

## 2022-04-30 LAB — BASIC METABOLIC PANEL
BUN: 11 (ref 4–21)
CO2: 26 — AB (ref 13–22)
Chloride: 100 (ref 99–108)
Creatinine: 1.1 (ref 0.5–1.1)
Glucose: 123
Potassium: 3.4 mEq/L — AB (ref 3.5–5.1)
Sodium: 134 — AB (ref 137–147)

## 2022-04-30 LAB — CBC AND DIFFERENTIAL
HCT: 42 (ref 36–46)
Hemoglobin: 13.9 (ref 12.0–16.0)
Neutrophils Absolute: 6.28
Platelets: 215 10*3/uL (ref 150–400)
WBC: 10.3

## 2022-04-30 LAB — HEPATIC FUNCTION PANEL
ALT: 16 U/L (ref 7–35)
AST: 21 (ref 13–35)
Alkaline Phosphatase: 89 (ref 25–125)
Bilirubin, Total: 0.3

## 2022-04-30 LAB — COMPREHENSIVE METABOLIC PANEL
Albumin: 4 (ref 3.5–5.0)
Calcium: 9.3 (ref 8.7–10.7)

## 2022-04-30 LAB — CBC: RBC: 4.6 (ref 3.87–5.11)

## 2022-04-30 MED ORDER — PROCHLORPERAZINE MALEATE 10 MG PO TABS: 10.0000 mg | ORAL_TABLET | Freq: Four times a day (QID) | ORAL | 1 refills | Status: DC | PRN

## 2022-04-30 MED ORDER — DEXAMETHASONE 4 MG PO TABS: ORAL_TABLET | ORAL | 1 refills | Status: DC

## 2022-04-30 NOTE — Progress Notes (Signed)
[place] [phone]   [phone]   Patient Care Team: Marco Collie, MD as PCP - General (Family Medicine) Laurell Roof, RN as Registered Nurse   Name of the patient: Lindsay Brooks  [id number]  Jun 28, 1951   Date of visit: 04/30/22  Diagnosis- Breast cancer  Chief complaint/Reason for visit- Initial Meeting for [facility], preparing for starting chemotherapy  Heme/Onc history:  Oncology History  Breast cancer of upper-outer quadrant of left female breast ([place])  03/18/2022 Initial Diagnosis   Breast cancer of upper-outer quadrant of left female breast ([place])   04/28/2022 Cancer Staging   Staging form: Breast, AJCC 8th Edition - Clinical stage from 04/28/2022: Stage IB (cT1c, cN0(sn), cM0, G3, ER-, PR-, HER2-) - Signed by Derwood Kaplan, MD on 04/28/2022 Histopathologic type: Infiltrating duct carcinoma, NOS Stage prefix: Initial diagnosis Method of lymph node assessment: Sentinel lymph node biopsy Nuclear grade: G3 Multigene prognostic tests performed: None Histologic grading system: 3 grade system Laterality: Left Tumor size (mm): 20 Lymph-vascular invasion (LVI): LVI not present (absent)/not identified Diagnostic confirmation: Positive histology Specimen type: Excision Staged by: Managing physician Menopausal status: Postmenopausal Ki-67 (%): 40 Stage used in treatment planning: Yes National guidelines used in treatment planning: Yes Type of national guideline used in treatment planning: NCCN   05/07/2022 -  Chemotherapy   Patient is on Treatment Plan : BREAST TC q21d       Interval history-  The patient presents to chemo care clinic today for initial meeting in preparation for starting chemotherapy. I introduced the chemo care clinic and we discussed that the role of the clinic is to assist those who are at an increased risk of emergency room visits and/or complications during  the course of chemotherapy treatment. We discussed that the increased risk takes into account factors such as age, performance status, and co-morbidities. We also discussed that for some, this might include barriers to care such as not having a primary care provider, lack of insurance/transportation, or not being able to afford medications. We discussed that the goal of the program is to help prevent unplanned ER visits and help reduce complications during chemotherapy. We do this by discussing specific risk factors to each individual and identifying ways that we can help improve these risk factors and reduce barriers to care.   Allergies  Allergen Reactions   Amoxicillin Hives    vomiting   Celecoxib Hives   Insulins Nausea And Vomiting    Patient unsure of name of insulin, will call us back   Vioxx [Rofecoxib] Hives   Liraglutide Nausea And Vomiting   Propranolol Nausea And Vomiting    diarrhea diarrhea diarrhea     Past Medical History:  Diagnosis Date   CAD (coronary artery disease)    a. s/p 2 CoStar DES to RCA 06/2004. b.  LHC 12/09:  EF 65%, oCFX 40%, oRCA 30%, then 30-40% leading into stented seg that is patent, (10-20% ISR), 40% beyond stent,;  c. EF 81%, poor exercise capacity, hypotensive BP response, + chest pain, normal nuclear images (overall low risk)   CKD (chronic kidney disease)    Colon polyps    a. s/p removal 01/2012 - was told one might be cancerous but was not diagnosed with colon cancer - plan is for f/u colonoscopy in 5 yrs.   GERD (gastroesophageal reflux disease)    Hx of echocardiogram    a. Echo 06/2011: EF 55-60%, Gr 1 diast  dysfn, mild MR   Insomnia    Mini stroke    History of, in 2005 (before she was diagnosed with CAD).   Osteoporosis    Other and unspecified hyperlipidemia    Other isolated or specific phobias    Peripheral vascular disease, unspecified ([place])    a. s/p L common femoral & left illiac stenting previously (patent by angio in 2007). -  ABI 06/2011 normal. b. Carotid US 0-39% BICA 06/2011 (for f/u 2 yrs);   c.  ABIs 9/13:  normal (1.1 bilaterally)   Personal history of peptic ulcer disease    Remotely.   Psychiatric pseudoseizure    per patient and family   Tobacco use disorder    Type II or unspecified type diabetes mellitus without mention of complication, not stated as uncontrolled    Unspecified essential hypertension     Past Surgical History:  Procedure Laterality Date   APPENDECTOMY     BREAST BIOPSY     CARDIAC CATHETERIZATION  2009   patent RCA stent. mild CAD otherwise.    CATARACT EXTRACTION, BILATERAL  2016   CHOLECYSTECTOMY  2009   hysterectomy (other)     age 60   KNEE ARTHROSCOPY Left    left arm surgery     LEFT HEART CATHETERIZATION WITH CORONARY ANGIOGRAM N/A 01/16/2014   Procedure: LEFT HEART CATHETERIZATION WITH CORONARY ANGIOGRAM;  Surgeon: Blane Ohara, MD;  Location: [facility] CATH LAB;  Service: Cardiovascular;  Laterality: N/A;   left leg angiography     stenting of the left common iliac artery   left rotator cuff repair     right coronary artery stent      Social History   Socioeconomic History   Marital status: Widowed    Spouse name: Not on file   Number of children: 3   Years of education: 9   Highest education level: 9th grade  Occupational History   Occupation: Retired  Tobacco Use   Smoking status: Every Day    Packs/day: 1.50    Years: 44.00    Total pack years: 66.00    Types: Cigarettes   Smokeless tobacco: Never   Tobacco comments:    12/29/18 1.5-2 ppd  Vaping Use   Vaping Use: Not on file  Substance and Sexual Activity   Alcohol use: No    Comment: Recovering alcoholic. Sober for >30 years.   Drug use: No   Sexual activity: Not on file  Other Topics Concern   Not on file  Social History Narrative   Lives in [place] with her daughter, and is currently not working outside the home.    Caffeine- coffee 1 cup, mtn dew      Social Determinants of Health    Financial Resource Strain: Not on file  Food Insecurity: Not on file  Transportation Needs: Not on file  Physical Activity: Not on file  Stress: Not on file  Social Connections: Not on file  Intimate Partner Violence: Not on file    Family History  Problem Relation Age of Onset   Heart attack Mother    Allergies Mother    Heart attack Father    Allergies Father    Kidney disease Father    Hypertension Father    Coronary artery disease Brother    Breast cancer Maternal Aunt    Breast cancer Maternal Aunt    Breast cancer Paternal Aunt    Breast cancer Daughter      Current Outpatient Medications:  aspirin EC 81 MG tablet, Take 81 mg by mouth daily., Disp: , Rfl:    atenolol (TENORMIN) 50 MG tablet, Take 75 mg by mouth 2 (two) times daily. , Disp: , Rfl:    busPIRone (BUSPAR) 7.5 MG tablet, Take 1 tablet by mouth in the morning, at noon, and at bedtime., Disp: , Rfl:    clopidogrel (PLAVIX) 75 MG tablet, Take 75 mg by mouth daily at 12 noon. , Disp: , Rfl:    dexamethasone (DECADRON) 4 MG tablet, Take 2 tabs by mouth 2 times daily starting day before chemo. Then take 2 tabs daily for 2 days starting day after chemo. Take with food., Disp: 30 tablet, Rfl: 1   DULoxetine (CYMBALTA) 60 MG capsule, Take 120 mg by mouth at bedtime. , Disp: , Rfl:    famotidine (PEPCID) 20 MG tablet, Take 20 mg by mouth 2 (two) times daily., Disp: , Rfl:    fenofibrate (TRICOR) 145 MG tablet, Take 145 mg by mouth daily., Disp: , Rfl:    fexofenadine (ALLEGRA) 180 MG tablet, Take 180 mg by mouth daily., Disp: , Rfl:    gabapentin (NEURONTIN) 600 MG tablet, Take 600 mg by mouth 3 (three) times daily., Disp: , Rfl:    hydrALAZINE (APRESOLINE) 50 MG tablet, Take 50 mg by mouth 3 (three) times daily., Disp: , Rfl:    LORazepam (ATIVAN) 1 MG tablet, SMARTSIG:1 Tablet(s) By Mouth (Patient not taking: Reported on 04/30/2022), Disp: , Rfl:    Melatonin 1 MG CAPS, Take by mouth. (Patient not taking: Reported  on 04/30/2022), Disp: , Rfl:    metoprolol tartrate (LOPRESSOR) 25 MG tablet, Take 25 mg by mouth 2 (two) times daily., Disp: , Rfl:    nitroGLYCERIN (NITROSTAT) 0.4 MG SL tablet, Place 0.4 mg under the tongue every 5 (five) minutes as needed. For chest pain, Disp: , Rfl:    ondansetron (ZOFRAN-ODT) 4 MG disintegrating tablet, Take 4 mg by mouth every 6 (six) hours as needed., Disp: , Rfl:    pantoprazole (PROTONIX) 40 MG tablet, Take 40 mg by mouth 2 (two) times daily., Disp: , Rfl:    prochlorperazine (COMPAZINE) 10 MG tablet, Take 1 tablet (10 mg total) by mouth every 6 (six) hours as needed for nausea or vomiting., Disp: 30 tablet, Rfl: 1   tirzepatide (MOUNJARO) 2.5 MG/0.5ML Pen, INJECT 2.5 MILLIGRAM EVERY WEEK, Disp: , Rfl:    valsartan-hydrochlorothiazide (DIOVAN-HCT) 320-25 MG tablet, TAKE 1 TABLET BY ORAL ROUTE ONCE DAILY FOR HIGH BLOOD PRESSURE, Disp: , Rfl:    zolpidem (AMBIEN) 10 MG tablet, Take 10 mg by mouth at bedtime as needed for sleep.  (Patient not taking: Reported on 04/30/2022), Disp: , Rfl:      Latest Ref Rng & Units 04/30/2022   12:00 AM  CMP  BUN 4 - 21 16      Creatinine 0.5 - 1.1 1.1      Sodium 137 - 147 135      Potassium 3.5 - 5.1 mEq/L 3.7      Chloride 99 - 108 98      CO2 13 - 22 28      Calcium 8.7 - 10.7 9.5      Alkaline Phos 25 - 125 87      AST 13 - 35 21      ALT 7 - 35 U/L 18         This result is from an external source.      Latest Ref  Rng & Units 04/30/2022   12:00 AM  CBC  WBC  8.9      Hemoglobin 12.0 - 16.0 13.7      Hematocrit 36 - 46 41      Platelets 150 - 400 K/uL 213         This result is from an external source.    No images are attached to the encounter.  No results found.   Assessment and plan- Patient is a 71 y.o. female who presents to [facility] for initial meeting in preparation for starting chemotherapy for the treatment of triple negative breast cancer.   Chemo Care Clinic/High Risk for ER/Hospitalization  during chemotherapy- We discussed the role of the chemo care clinic and identified patient specific risk factors. I discussed that patient was identified as high risk primarily based on:  Patient has past medical history positive for: Past Medical History:  Diagnosis Date   CAD (coronary artery disease)    a. s/p 2 CoStar DES to RCA 06/2004. b.  LHC 12/09:  EF 65%, oCFX 40%, oRCA 30%, then 30-40% leading into stented seg that is patent, (10-20% ISR), 40% beyond stent,;  c. EF 81%, poor exercise capacity, hypotensive BP response, + chest pain, normal nuclear images (overall low risk)   CKD (chronic kidney disease)    Colon polyps    a. s/p removal 01/2012 - was told one might be cancerous but was not diagnosed with colon cancer - plan is for f/u colonoscopy in 5 yrs.   GERD (gastroesophageal reflux disease)    Hx of echocardiogram    a. Echo 06/2011: EF 55-60%, Gr 1 diast dysfn, mild MR   Insomnia    Mini stroke    History of, in 2005 (before she was diagnosed with CAD).   Osteoporosis    Other and unspecified hyperlipidemia    Other isolated or specific phobias    Peripheral vascular disease, unspecified ([place])    a. s/p L common femoral & left illiac stenting previously (patent by angio in 2007). - ABI 06/2011 normal. b. Carotid US 0-39% BICA 06/2011 (for f/u 2 yrs);   c.  ABIs 9/13:  normal (1.1 bilaterally)   Personal history of peptic ulcer disease    Remotely.   Psychiatric pseudoseizure    per patient and family   Tobacco use disorder    Type II or unspecified type diabetes mellitus without mention of complication, not stated as uncontrolled    Unspecified essential hypertension     Patient has past surgical history positive for: Past Surgical History:  Procedure Laterality Date   APPENDECTOMY     BREAST BIOPSY     CARDIAC CATHETERIZATION  2009   patent RCA stent. mild CAD otherwise.    CATARACT EXTRACTION, BILATERAL  2016   CHOLECYSTECTOMY  2009   hysterectomy (other)      age 44   KNEE ARTHROSCOPY Left    left arm surgery     LEFT HEART CATHETERIZATION WITH CORONARY ANGIOGRAM N/A 01/16/2014   Procedure: LEFT HEART CATHETERIZATION WITH CORONARY ANGIOGRAM;  Surgeon: Blane Ohara, MD;  Location: [facility] CATH LAB;  Service: Cardiovascular;  Laterality: N/A;   left leg angiography     stenting of the left common iliac artery   left rotator cuff repair     right coronary artery stent      Provided general information including the following:  1.  Date of education: 04/30/2022 2.  Physician name: Dr. Hosie Poisson 3.  Diagnosis: Breast Cancer 4.  Stage: IB 5.  Curative 6.  Chemotherapy plan including drugs and how often:  Taxol/Carboplatin every 3 weeks for 4 cycles 7.  Start date: 05/07/2022 8.  Other referrals: None at this time 9.  The patient is to call our office with any questions or concerns.  Our office number [phone], if after hours or on the weekend, call the same number and wait for the answering service.  There is always an oncologist on call. 10.  Medications prescribed:  prochlorperazine, dexamethasone.  The patient has ondansetron at home. 11.  The patient has verbalized understanding of the treatment plan and has no barriers to adherence or understanding.   Obtained signed consent from patient.   Discussed symptoms including:  1.  Low blood counts including red blood cells, white blood cells and platelets. 2. Infection including to avoid large crowds, wash hands frequently, and stay away from people who were sick.  If fever develops of 100.4 or higher, call our office. 3.  Mucositis-given instructions on mouth rinse (baking soda and salt mixture).  Keep mouth clean.  Use soft bristle toothbrush.  If mouth sores develop, call our clinic. 4.  Nausea/vomiting-gave prescriptions for ondansetron 4 mg every 4 hours as needed for nausea, may take around the clock if persistent.  Prochlorperazine 10 mg every 6 hours, may take around the clock if  persistent. 5.  Diarrhea-use over-the-counter Imodium.  Call clinic if not controlled. 6.  Constipation-use senna-S, 1 to 2 tablets twice a day.  If no BM in 2 to 3 days call the clinic. 7.  Loss of appetite-try to eat small meals every 2-3 hours.  Call clinic if not eating or drinking. 8.  Taste changes-zinc 500 mg daily.  If becomes severe call clinic. 9.  Alcoholic beverages. 10.  Drink 2 to 3 quarts of water per day. Call clinic if not able to drink. 11.  Peripheral neuropathy-patient to call if numbness or tingling in hands or feet is persistent 12.  Abnormal bleeding   Dexamethasone 4 mg-the patient will take 2 tablets twice daily the day before chemotherapy and 2 tablets twice daily for 2 days after chemotherapy.  Neulasta-will be given 24 to 48 hours after chemotherapy.  Gave information sheet on bone and joint pain.  Use Claritin or Pepcid.  May use ibuprofen or Aleve.  Call if symptoms persist or are unbearable.   The patient was given written information printed from [employer] patient education on individual chemotherapy agents which includes: Name of medication Approved uses Dose and schedule Storage and handling Handling body fluids and waste Drug and food interactions Possible side effects and management Pregnancy, sexual activity, and contraception Obtaining medication   Gave information on the supportive care team and how to contact them regarding services.  Discussed advanced directives.  The patient does not have their advanced directives.   We discussed that social determinants of health may have significant impacts on health and outcomes for cancer patients.  Today we discussed specific social determinants of performance status, alcohol use, depression, financial needs, food insecurity, housing, interpersonal violence, social connections, stress, tobacco use, and transportation.    After lengthy discussion the following were identified as areas of need:   Outpatient  services: We discussed options including home based and outpatient services, DME, nutrition counseling, and supportive care program. We discussed that patients who participate in regular physical activity report fewer negative impacts of cancer and treatments and report less fatigue.   Financial Concerns:  We discussed that living with cancer can create tremendous financial burden.  We discussed options for assistance. I asked that if assistance is needed in affording medications or paying bills to please let us know so that we can provide assistance. We discussed options for food including social services.  Referral to Social work: Introduced social work services including counseling, as well as support for financial assistance, medication coverage and other social needs.  Support groups: We discussed options for support groups at [facility]. We discussed options for managing stress including healthy eating and exercise, as well as participating in no charge counseling services at the cancer center and support groups.  If these are of interest, patient can notify either myself or primary nursing team.We discussed options for management including medications.  Transportation: We discussed options for transportation.  The patient will contact our office if she requires assistance with transportation.  Palliative care services: We have palliative care services available in the cancer center to discuss goals of care and advanced care planning.  Please let us know if you have any questions or would like to speak to our palliative care practitioner.  Symptom Management Clinic: We discussed our symptom management clinic which is available for acute concerns while receiving treatment such as nausea, vomiting or diarrhea.  We can be reached via telephone at [phone].  We are available for virtual or in person visits on the same day from 9 to 4 PM Monday through Friday.   She denies needing specific  assistance at this time.  She will be followed by Dr. Remi Deter clinical team.   Disposition: RTC on 05/18/2022  Visit Diagnosis 1. Malignant neoplasm of upper-outer quadrant of left breast in female, estrogen receptor negative ([place])     I discussed the assessment and treatment plan with the patient and her daughter.  They were provided an opportunity to ask questions and all were answered. The patient expressed understanding and was in agreement with this plan. She also understands that she can call clinic at any time with any questions, concerns, or complaints.   I provided 35 minutes of  face to face time  during this encounter all of which was spent counseling as documented under my assessment & plan.     Lema Heinkel A. Georgann Housekeeper, [place] [phone]

## 2022-05-01 ENCOUNTER — Other Ambulatory Visit: Payer: Self-pay

## 2022-05-01 ENCOUNTER — Telehealth: Payer: Self-pay

## 2022-05-01 ENCOUNTER — Encounter: Payer: Self-pay | Admitting: Oncology

## 2022-05-01 ENCOUNTER — Other Ambulatory Visit: Payer: Self-pay | Admitting: Hematology and Oncology

## 2022-05-01 ENCOUNTER — Telehealth: Payer: Self-pay | Admitting: Genetic Counselor

## 2022-05-01 DIAGNOSIS — Z171 Estrogen receptor negative status [ER-]: Secondary | ICD-10-CM

## 2022-05-01 MED ORDER — LORAZEPAM 0.5 MG PO TABS: 0.5000 mg | ORAL_TABLET | Freq: Every evening | ORAL | 0 refills | Status: DC | PRN

## 2022-05-01 MED ORDER — NICOTINE 21 MG/24HR TD PT24: 21.0000 mg | MEDICATED_PATCH | Freq: Every day | TRANSDERMAL | 0 refills | Status: DC

## 2022-05-01 NOTE — Telephone Encounter (Signed)
[facility] Common Hereditary Cancers +RNA Panel ordered per request from Rosanne Sack, [place].     The Common Hereditary Cancers + RNA Panel offered by [facility] includes sequencing, deletion/duplication, and RNA testing of the following 47 genes: APC, ATM, AXIN2, BARD1, BMPR1A, BRCA1, BRCA2, BRIP1, CDH1, CDK4*, CDKN2A (p14ARF)*, CDKN2A (p16INK4a)*, CHEK2, CTNNA1, DICER1, EPCAM (Deletion/duplication testing only), GREM1 (promoter region deletion/duplication testing only), KIT, MEN1, MLH1, MSH2, MSH3, MSH6, MUTYH, NBN, NF1, NHTL1, PALB2, PDGFRA*, PMS2, POLD1, POLE, PTEN, RAD50, RAD51C, RAD51D, SDHB, SDHC, SDHD, SMAD4, SMARCA4. STK11, TP53, TSC1, TSC2, and VHL.  The following genes were evaluated for sequence changes only: SDHA and HOXB13 c.251G>A variant only.  RNA analysis is not performed for the * genes.    Daughter has BRCA1 mutation c.4524G>A (p.Trp1508*) by [facility] in 2016. Daughter's BRCA1 positive report provided to lab.

## 2022-05-01 NOTE — Telephone Encounter (Addendum)
Lindsay LundJohnney Killian, RN Sorry, I had to look up dosing instructions. I sent in 21 mg/d patch as chart says she smokes 1.5 packs per day. If she does well with this, we will decrease down to 14 mg/d then 7 mg/d. Thanks   I notified Roanna Epley of below. She asked if [name] could [facility] in script for patches. Medicaid paid for hers in past. ----- Message from Marvia Pickles, PA-C sent at 05/01/2022  1:09 PM EDT ----- Regarding: RE: Smoking cessation Nicotine patches, gum, lozenges. You can tell her about [place] [facility]uit[facility]. Thanks ----- Message ----- From: Dairl Ponder, RN Sent: 05/01/2022  12:42 PM EDT To: Marvia Pickles, PA-C Subject: RE: Smoking cessation                          Any OTC recommendations? ----- Message ----- From: Marvia Pickles, PA-C Sent: 05/01/2022  12:34 PM EDT To: Dairl Ponder, RN Subject: RE: Smoking cessation                          I don't recommend with the amount of meds she is already on ----- Message ----- From: Dairl Ponder, RN Sent: 05/01/2022  12:18 PM EDT To: Marvia Pickles, PA-C Subject: Smoking cessation                              Pt's daughter, Roanna Epley, called to request something to be called in to pharmacy to help her mom stop smoking. Pt has decided to try to stop smoking.

## 2022-05-02 ENCOUNTER — Encounter: Payer: Self-pay | Admitting: Oncology

## 2022-05-05 ENCOUNTER — Telehealth: Payer: Self-pay | Admitting: Licensed Clinical Social Worker

## 2022-05-05 DIAGNOSIS — C50912 Malignant neoplasm of unspecified site of left female breast: Secondary | ICD-10-CM | POA: Diagnosis not present

## 2022-05-05 DIAGNOSIS — R918 Other nonspecific abnormal finding of lung field: Secondary | ICD-10-CM | POA: Diagnosis not present

## 2022-05-05 DIAGNOSIS — M1711 Unilateral primary osteoarthritis, right knee: Secondary | ICD-10-CM | POA: Diagnosis not present

## 2022-05-05 DIAGNOSIS — R937 Abnormal findings on diagnostic imaging of other parts of musculoskeletal system: Secondary | ICD-10-CM | POA: Diagnosis not present

## 2022-05-05 DIAGNOSIS — C50412 Malignant neoplasm of upper-outer quadrant of left female breast: Secondary | ICD-10-CM | POA: Diagnosis not present

## 2022-05-05 DIAGNOSIS — M19032 Primary osteoarthritis, left wrist: Secondary | ICD-10-CM | POA: Diagnosis not present

## 2022-05-05 DIAGNOSIS — R911 Solitary pulmonary nodule: Secondary | ICD-10-CM | POA: Diagnosis not present

## 2022-05-05 DIAGNOSIS — I700 Atherosclerosis of aorta: Secondary | ICD-10-CM | POA: Diagnosis not present

## 2022-05-05 NOTE — Telephone Encounter (Signed)
Scheduled appt per 9/22 referral. Pt's daughter is aware of appt date and time. Pt's daughter is aware to arrive 15 mins prior to appt time and to bring and updated insurance card. Pt's daughter is aware of appt location.

## 2022-05-06 ENCOUNTER — Other Ambulatory Visit: Payer: Self-pay

## 2022-05-06 MED FILL — Cyclophosphamide For Inj 1 GM: INTRAMUSCULAR | Qty: 50 | Status: AC

## 2022-05-06 MED FILL — Dexamethasone Sodium Phosphate Inj 100 MG/10ML: INTRAMUSCULAR | Qty: 1 | Status: AC

## 2022-05-06 MED FILL — Docetaxel Soln for IV Infusion 160 MG/16ML: INTRAVENOUS | Qty: 13 | Status: AC

## 2022-05-07 ENCOUNTER — Inpatient Hospital Stay: Payer: Medicare Other

## 2022-05-07 ENCOUNTER — Telehealth: Payer: Self-pay

## 2022-05-07 ENCOUNTER — Encounter: Payer: Self-pay | Admitting: Oncology

## 2022-05-07 VITALS — BP 129/61 | HR 108 | Temp 97.6°F | Resp 16 | Ht 61.0 in | Wt 138.0 lb

## 2022-05-07 DIAGNOSIS — Z79899 Other long term (current) drug therapy: Secondary | ICD-10-CM | POA: Diagnosis not present

## 2022-05-07 DIAGNOSIS — Z170 Estrogen receptor positive status [ER+]: Secondary | ICD-10-CM | POA: Diagnosis not present

## 2022-05-07 DIAGNOSIS — Z7982 Long term (current) use of aspirin: Secondary | ICD-10-CM | POA: Diagnosis not present

## 2022-05-07 DIAGNOSIS — Z5111 Encounter for antineoplastic chemotherapy: Secondary | ICD-10-CM | POA: Diagnosis not present

## 2022-05-07 DIAGNOSIS — C50412 Malignant neoplasm of upper-outer quadrant of left female breast: Secondary | ICD-10-CM | POA: Diagnosis not present

## 2022-05-07 DIAGNOSIS — Z7952 Long term (current) use of systemic steroids: Secondary | ICD-10-CM | POA: Diagnosis not present

## 2022-05-07 DIAGNOSIS — Z7902 Long term (current) use of antithrombotics/antiplatelets: Secondary | ICD-10-CM | POA: Diagnosis not present

## 2022-05-07 DIAGNOSIS — F17210 Nicotine dependence, cigarettes, uncomplicated: Secondary | ICD-10-CM | POA: Diagnosis not present

## 2022-05-07 DIAGNOSIS — Z5189 Encounter for other specified aftercare: Secondary | ICD-10-CM | POA: Diagnosis not present

## 2022-05-07 DIAGNOSIS — Z171 Estrogen receptor negative status [ER-]: Secondary | ICD-10-CM

## 2022-05-07 MED ORDER — LIDOCAINE-PRILOCAINE 2.5-2.5 % EX CREA: TOPICAL_CREAM | CUTANEOUS | 3 refills | Status: DC

## 2022-05-07 MED ORDER — SODIUM CHLORIDE 0.9 % IV SOLN
75.0000 mg/m2 | Freq: Once | INTRAVENOUS | Status: AC
  Administered 2022-05-07: 130 mg via INTRAVENOUS
  Filled 2022-05-07: qty 13

## 2022-05-07 MED ORDER — SODIUM CHLORIDE 0.9 % IV SOLN
10.0000 mg | Freq: Once | INTRAVENOUS | Status: AC
  Administered 2022-05-07: 10 mg via INTRAVENOUS
  Filled 2022-05-07: qty 10

## 2022-05-07 MED ORDER — HEPARIN SOD (PORK) LOCK FLUSH 100 UNIT/ML IV SOLN
500.0000 [IU] | Freq: Once | INTRAVENOUS | Status: AC | PRN
  Administered 2022-05-07: 500 [IU]

## 2022-05-07 MED ORDER — SODIUM CHLORIDE 0.9% FLUSH
10.0000 mL | INTRAVENOUS | Status: DC | PRN
  Administered 2022-05-07: 10 mL

## 2022-05-07 MED ORDER — SODIUM CHLORIDE 0.9 % IV SOLN
600.0000 mg/m2 | Freq: Once | INTRAVENOUS | Status: AC
  Administered 2022-05-07: 1000 mg via INTRAVENOUS
  Filled 2022-05-07: qty 50

## 2022-05-07 MED ORDER — PALONOSETRON HCL INJECTION 0.25 MG/5ML
0.2500 mg | Freq: Once | INTRAVENOUS | Status: AC
  Administered 2022-05-07: 0.25 mg via INTRAVENOUS
  Filled 2022-05-07: qty 5

## 2022-05-07 MED ORDER — SODIUM CHLORIDE 0.9 % IV SOLN: Freq: Once | INTRAVENOUS | Status: AC

## 2022-05-07 NOTE — Progress Notes (Signed)
Daughter reports that patient forgot to take pre-med Steroids at home. Ulice Dash Pharm D notified and patient and daughter instructed to take steroids for next two days

## 2022-05-07 NOTE — Telephone Encounter (Signed)
Printed

## 2022-05-07 NOTE — Patient Instructions (Addendum)
Docetaxel Injection What is this medication? DOCETAXEL (doe se TAX el) treats some types of cancer. It works by slowing down the growth of cancer cells. This medicine may be used for other purposes; ask your health care provider or pharmacist if you have questions. COMMON BRAND NAME(S): Docefrez, Taxotere What should I tell my care team before I take this medication? They need to know if you have any of these conditions: Kidney disease Liver disease Low white blood cell levels Tingling of the fingers or toes or other nerve disorder An unusual or allergic reaction to docetaxel, polysorbate 80, other medications, foods, dyes, or preservatives Pregnant or trying to get pregnant Breast-feeding How should I use this medication? This medication is injected into a vein. It is given by your care team in a hospital or clinic setting. Talk to your care team about the use of this medication in children. Special care may be needed. Overdosage: If you think you have taken too much of this medicine contact a poison control center or emergency room at once. NOTE: This medicine is only for you. Do not share this medicine with others. What if I miss a dose? Keep appointments for follow-up doses. It is important not to miss your dose. Call your care team if you are unable to keep an appointment. What may interact with this medication? Do not take this medication with any of the following: Live virus vaccines This medication may also interact with the following: Certain antibiotics, such as clarithromycin, telithromycin Certain antivirals for HIV or hepatitis Certain medications for fungal infections, such as itraconazole, ketoconazole, voriconazole Grapefruit juice Nefazodone Supplements, such as St. John's wort This list may not describe all possible interactions. Give your health care provider a list of all the medicines, herbs, non-prescription drugs, or dietary supplements you use. Also tell them if  you smoke, drink alcohol, or use illegal drugs. Some items may interact with your medicine. What should I watch for while using this medication? This medication may make you feel generally unwell. This is not uncommon as chemotherapy can affect healthy cells as well as cancer cells. Report any side effects. Continue your course of treatment even though you feel ill unless your care team tells you to stop. You may need blood work done while you are taking this medication. This medication can cause serious side effects and infusion reactions. To reduce the risk, your care team may give you other medications to take before receiving this one. Be sure to follow the directions from your care team. This medication may increase your risk of getting an infection. Call your care team for advice if you get a fever, chills, sore throat, or other symptoms of a cold or flu. Do not treat yourself. Try to avoid being around people who are sick. Avoid taking medications that contain aspirin, acetaminophen, ibuprofen, naproxen, or ketoprofen unless instructed by your care team. These medications may hide a fever. Be careful brushing or flossing your teeth or using a toothpick because you may get an infection or bleed more easily. If you have any dental work done, tell your dentist you are receiving this medication. Some products may contain alcohol. Ask your care team if this medication contains alcohol. Be sure to tell all care teams you are taking this medicine. Certain medications, like metronidazole and disulfiram, can cause an unpleasant reaction when taken with alcohol. The reaction includes flushing, headache, nausea, vomiting, sweating, and increased thirst. The reaction can last from 30 minutes to several hours.  This medication may affect your coordination, reaction time, or judgement. Do not drive or operate machinery until you know how this medication affects you. Sit up or stand slowly to reduce the risk of  dizzy or fainting spells. Drinking alcohol with this medication can increase the risk of these side effects. Talk to your care team about your risk of cancer. You may be more at risk for certain types of cancer if you take this medication. Talk to your care team if you wish to become pregnant or think you might be pregnant. This medication can cause serious birth defects if taken during pregnancy or if you get pregnant within 2 months after stopping therapy. A negative pregnancy test is required before starting this medication. A reliable form of contraception is recommended while taking this medication and for 2 months after stopping it. Talk to your care team about reliable forms of contraception. Do not breast-feed while taking this medication and for 1 week after stopping therapy. Use a condom during sex and for 4 months after stopping therapy. Tell your care team right away if you think your partner might be pregnant. This medication can cause serious birth defects. This medication may cause infertility. Talk to your care team if you are concerned about your fertility. What side effects may I notice from receiving this medication? Side effects that you should report to your care team as soon as possible: Allergic reactions--skin rash, itching, hives, swelling of the face, lips, tongue, or throat Change in vision such as blurry vision, seeing halos around lights, vision loss Infection--fever, chills, cough, or sore throat Infusion reactions--chest pain, shortness of breath or trouble breathing, feeling faint or lightheaded Low red blood cell level--unusual weakness or fatigue, dizziness, headache, trouble breathing Pain, tingling, or numbness in the hands or feet Painful swelling, warmth, or redness of the skin, blisters or sores at the infusion site Redness, blistering, peeling, or loosening of the skin, including inside the mouth Sudden or severe stomach pain, bloody diarrhea, fever, nausea,  vomiting Swelling of the ankles, hands, or feet Tumor lysis syndrome (TLS)--nausea, vomiting, diarrhea, decrease in the amount of urine, dark urine, unusual weakness or fatigue, confusion, muscle pain or cramps, fast or irregular heartbeat, joint pain Unusual bruising or bleeding Side effects that usually do not require medical attention (report to your care team if they continue or are bothersome): Change in nail shape, thickness, or color Change in taste Hair loss Increased tears This list may not describe all possible side effects. Call your doctor for medical advice about side effects. You may report side effects to FDA at 1-800-FDA-1088. Where should I keep my medication? This medication is given in a hospital or clinic. It will not be stored at home. NOTE: This sheet is a summary. It may not cover all possible information. If you have questions about this medicine, talk to your doctor, pharmacist, or health care provider.  2023 Elsevier/Gold Standard (2021-09-26 00:00:00) Cyclophosphamide Injection What is this medication? CYCLOPHOSPHAMIDE (sye kloe FOSS fa mide) treats some types of cancer. It works by slowing down the growth of cancer cells. This medicine may be used for other purposes; ask your health care provider or pharmacist if you have questions. COMMON BRAND NAME(S): Cyclophosphamide, Cytoxan, Neosar What should I tell my care team before I take this medication? They need to know if you have any of these conditions: Heart disease Irregular heartbeat or rhythm Infection Kidney problems Liver disease Low blood cell levels (white cells, platelets, or red  blood cells) Lung disease Previous radiation Trouble passing urine An unusual or allergic reaction to cyclophosphamide, other medications, foods, dyes, or preservatives Pregnant or trying to get pregnant Breast-feeding How should I use this medication? This medication is injected into a vein. It is given by your care  team in a hospital or clinic setting. Talk to your care team about the use of this medication in children. Special care may be needed. Overdosage: If you think you have taken too much of this medicine contact a poison control center or emergency room at once. NOTE: This medicine is only for you. Do not share this medicine with others. What if I miss a dose? Keep appointments for follow-up doses. It is important not to miss your dose. Call your care team if you are unable to keep an appointment. What may interact with this medication? Amphotericin B Amiodarone Azathioprine Certain antivirals for HIV or hepatitis Certain medications for blood pressure, such as enalapril, lisinopril, quinapril Cyclosporine Diuretics Etanercept Indomethacin Medications that relax muscles Metronidazole Natalizumab Tamoxifen Warfarin This list may not describe all possible interactions. Give your health care provider a list of all the medicines, herbs, non-prescription drugs, or dietary supplements you use. Also tell them if you smoke, drink alcohol, or use illegal drugs. Some items may interact with your medicine. What should I watch for while using this medication? This medication may make you feel generally unwell. This is not uncommon as chemotherapy can affect healthy cells as well as cancer cells. Report any side effects. Continue your course of treatment even though you feel ill unless your care team tells you to stop. You may need blood work while you are taking this medication. This medication may increase your risk of getting an infection. Call your care team for advice if you get a fever, chills, sore throat, or other symptoms of a cold or flu. Do not treat yourself. Try to avoid being around people who are sick. Avoid taking medications that contain aspirin, acetaminophen, ibuprofen, naproxen, or ketoprofen unless instructed by your care team. These medications may hide a fever. Be careful brushing  or flossing your teeth or using a toothpick because you may get an infection or bleed more easily. If you have any dental work done, tell your dentist you are receiving this medication. Drink water or other fluids as directed. Urinate often, even at night. Some products may contain alcohol. Ask your care team if this medication contains alcohol. Be sure to tell all care teams you are taking this medicine. Certain medicines, like metronidazole and disulfiram, can cause an unpleasant reaction when taken with alcohol. The reaction includes flushing, headache, nausea, vomiting, sweating, and increased thirst. The reaction can last from 30 minutes to several hours. Talk to your care team if you wish to become pregnant or think you might be pregnant. This medication can cause serious birth defects if taken during pregnancy and for 1 year after the last dose. A negative pregnancy test is required before starting this medication. A reliable form of contraception is recommended while taking this medication and for 1 year after the last dose. Talk to your care team about reliable forms of contraception. Do not father a child while taking this medication and for 4 months after the last dose. Use a condom during this time period. Do not breast-feed while taking this medication or for 1 week after the last dose. This medication may cause infertility. Talk to your care team if you are concerned about your fertility.  Talk to your care team about your risk of cancer. You may be more at risk for certain types of cancer if you take this medication. What side effects may I notice from receiving this medication? Side effects that you should report to your care team as soon as possible: Allergic reactions--skin rash, itching, hives, swelling of the face, lips, tongue, or throat Dry cough, shortness of breath or trouble breathing Heart failure--shortness of breath, swelling of the ankles, feet, or hands, sudden weight gain,  unusual weakness or fatigue Heart muscle inflammation--unusual weakness or fatigue, shortness of breath, chest pain, fast or irregular heartbeat, dizziness, swelling of the ankles, feet, or hands Heart rhythm changes--fast or irregular heartbeat, dizziness, feeling faint or lightheaded, chest pain, trouble breathing Infection--fever, chills, cough, sore throat, wounds that don't heal, pain or trouble when passing urine, general feeling of discomfort or being unwell Kidney injury--decrease in the amount of urine, swelling of the ankles, hands, or feet Liver injury--right upper belly pain, loss of appetite, nausea, light-colored stool, dark yellow or brown urine, yellowing skin or eyes, unusual weakness or fatigue Low red blood cell level--unusual weakness or fatigue, dizziness, headache, trouble breathing Low sodium level--muscle weakness, fatigue, dizziness, headache, confusion Red or dark brown urine Unusual bruising or bleeding Side effects that usually do not require medical attention (report to your care team if they continue or are bothersome): Hair loss Irregular menstrual cycles or spotting Loss of appetite Nausea Pain, redness, or swelling with sores inside the mouth or throat Vomiting This list may not describe all possible side effects. Call your doctor for medical advice about side effects. You may report side effects to FDA at 1-800-FDA-1088. Where should I keep my medication? This medication is given in a hospital or clinic. It will not be stored at home. NOTE: This sheet is a summary. It may not cover all possible information. If you have questions about this medicine, talk to your doctor, pharmacist, or health care provider.  2023 Elsevier/Gold Standard (2021-11-05 00:00:00)

## 2022-05-07 NOTE — Telephone Encounter (Signed)
-----   Message from Derwood Kaplan, MD sent at 05/06/2022  1:42 PM EDT ----- Regarding: CT Pls print her CT and bone scan from [facility]

## 2022-05-08 ENCOUNTER — Telehealth: Payer: Self-pay

## 2022-05-08 ENCOUNTER — Inpatient Hospital Stay: Payer: Medicare Other

## 2022-05-08 VITALS — BP 128/78 | HR 89 | Temp 98.2°F | Resp 18

## 2022-05-08 DIAGNOSIS — C50412 Malignant neoplasm of upper-outer quadrant of left female breast: Secondary | ICD-10-CM | POA: Diagnosis not present

## 2022-05-08 DIAGNOSIS — Z170 Estrogen receptor positive status [ER+]: Secondary | ICD-10-CM | POA: Diagnosis not present

## 2022-05-08 DIAGNOSIS — Z5189 Encounter for other specified aftercare: Secondary | ICD-10-CM | POA: Diagnosis not present

## 2022-05-08 DIAGNOSIS — Z7952 Long term (current) use of systemic steroids: Secondary | ICD-10-CM | POA: Diagnosis not present

## 2022-05-08 DIAGNOSIS — F17210 Nicotine dependence, cigarettes, uncomplicated: Secondary | ICD-10-CM | POA: Diagnosis not present

## 2022-05-08 DIAGNOSIS — Z7982 Long term (current) use of aspirin: Secondary | ICD-10-CM | POA: Diagnosis not present

## 2022-05-08 DIAGNOSIS — Z79899 Other long term (current) drug therapy: Secondary | ICD-10-CM | POA: Diagnosis not present

## 2022-05-08 DIAGNOSIS — Z5111 Encounter for antineoplastic chemotherapy: Secondary | ICD-10-CM | POA: Diagnosis not present

## 2022-05-08 DIAGNOSIS — Z7902 Long term (current) use of antithrombotics/antiplatelets: Secondary | ICD-10-CM | POA: Diagnosis not present

## 2022-05-08 MED ORDER — PEGFILGRASTIM-CBQV 6 MG/0.6ML ~~LOC~~ SOSY
6.0000 mg | PREFILLED_SYRINGE | Freq: Once | SUBCUTANEOUS | Status: AC
  Administered 2022-05-08: 6 mg via SUBCUTANEOUS
  Filled 2022-05-08: qty 0.6

## 2022-05-08 NOTE — Telephone Encounter (Signed)
I spoke with Roanna Epley, pt's daughter. She said her mom did great. She had a little nausea, but was relieved with antiemetic. Pt denies skin rashes, fever, emesis, & diarrhea. They have an appt this afternoon for the pegfilgrastim injection. I asked if they had been told to take Claritin '10mg'$  po qd today and for 4 days to decrease chance of bone pain. She said 'No, but I remember that when I was taking chemo". I reminded them to call us if she develops temp of 100.4 or higher, day or night. She verbalized understanding.

## 2022-05-10 DIAGNOSIS — I10 Essential (primary) hypertension: Secondary | ICD-10-CM | POA: Diagnosis not present

## 2022-05-10 DIAGNOSIS — E1169 Type 2 diabetes mellitus with other specified complication: Secondary | ICD-10-CM | POA: Diagnosis not present

## 2022-05-11 ENCOUNTER — Other Ambulatory Visit: Payer: Self-pay

## 2022-05-11 ENCOUNTER — Telehealth: Payer: Self-pay

## 2022-05-11 NOTE — Telephone Encounter (Signed)
Frank Pilger,RN: I notified Serena of [place] recommendations below. She verbalized understanding.  Kelli Mosher,PA: Yes, I think she should have dexamethasone 4 mg at home. Take 1 twice daily and let us know if that doesn't help. She can also take Pepcid and Tylenol  Collie Siad Phy,RPH: Should we do a few days of steroids?  Eshaan Titzer,RN: Pt is having a lot of leg pain from the injection. The daughter gave her benadryl, as pt is allergic to Claritin. Doesn't seem to be helping much.

## 2022-05-13 ENCOUNTER — Telehealth: Payer: Self-pay

## 2022-05-13 ENCOUNTER — Other Ambulatory Visit: Payer: Self-pay | Admitting: Hematology and Oncology

## 2022-05-13 ENCOUNTER — Inpatient Hospital Stay: Payer: Medicare Other | Attending: Oncology

## 2022-05-13 DIAGNOSIS — R7989 Other specified abnormal findings of blood chemistry: Secondary | ICD-10-CM

## 2022-05-13 DIAGNOSIS — R040 Epistaxis: Secondary | ICD-10-CM

## 2022-05-13 DIAGNOSIS — K7460 Unspecified cirrhosis of liver: Secondary | ICD-10-CM | POA: Insufficient documentation

## 2022-05-13 DIAGNOSIS — Z803 Family history of malignant neoplasm of breast: Secondary | ICD-10-CM | POA: Insufficient documentation

## 2022-05-13 DIAGNOSIS — E860 Dehydration: Secondary | ICD-10-CM | POA: Insufficient documentation

## 2022-05-13 DIAGNOSIS — D72829 Elevated white blood cell count, unspecified: Secondary | ICD-10-CM | POA: Insufficient documentation

## 2022-05-13 DIAGNOSIS — Z171 Estrogen receptor negative status [ER-]: Secondary | ICD-10-CM

## 2022-05-13 DIAGNOSIS — H6693 Otitis media, unspecified, bilateral: Secondary | ICD-10-CM | POA: Insufficient documentation

## 2022-05-13 DIAGNOSIS — F0390 Unspecified dementia without behavioral disturbance: Secondary | ICD-10-CM | POA: Insufficient documentation

## 2022-05-13 DIAGNOSIS — Z1502 Genetic susceptibility to malignant neoplasm of ovary: Secondary | ICD-10-CM | POA: Insufficient documentation

## 2022-05-13 DIAGNOSIS — G629 Polyneuropathy, unspecified: Secondary | ICD-10-CM | POA: Insufficient documentation

## 2022-05-13 DIAGNOSIS — Z5111 Encounter for antineoplastic chemotherapy: Secondary | ICD-10-CM | POA: Insufficient documentation

## 2022-05-13 DIAGNOSIS — C50412 Malignant neoplasm of upper-outer quadrant of left female breast: Secondary | ICD-10-CM | POA: Diagnosis not present

## 2022-05-13 DIAGNOSIS — H9190 Unspecified hearing loss, unspecified ear: Secondary | ICD-10-CM | POA: Insufficient documentation

## 2022-05-13 DIAGNOSIS — Z23 Encounter for immunization: Secondary | ICD-10-CM | POA: Insufficient documentation

## 2022-05-13 DIAGNOSIS — Z1509 Genetic susceptibility to other malignant neoplasm: Secondary | ICD-10-CM | POA: Insufficient documentation

## 2022-05-13 DIAGNOSIS — Z1501 Genetic susceptibility to malignant neoplasm of breast: Secondary | ICD-10-CM | POA: Insufficient documentation

## 2022-05-13 DIAGNOSIS — R569 Unspecified convulsions: Secondary | ICD-10-CM | POA: Insufficient documentation

## 2022-05-13 DIAGNOSIS — Z5189 Encounter for other specified aftercare: Secondary | ICD-10-CM | POA: Insufficient documentation

## 2022-05-13 DIAGNOSIS — R918 Other nonspecific abnormal finding of lung field: Secondary | ICD-10-CM | POA: Insufficient documentation

## 2022-05-13 DIAGNOSIS — E876 Hypokalemia: Secondary | ICD-10-CM | POA: Insufficient documentation

## 2022-05-13 NOTE — Telephone Encounter (Signed)
Lindsay Mosher,PA: Platelets were 131,000, so asking her to f/u with provider who is prescribing asa and plavix  Lindsay Brooks would like to have pt to come to clinic to check her CBC. I notified Lindsay Brooks they need to be here for labs before 415p. She voiced understanding. I sent message to [facility] in lab, & Pam in registration that appt had been added on.  Hiromi Knodel,RN: I called back and spoke to her daughter, Lindsay Brooks. Pt is not actively bleeding anywhere. In fact, she is loading her dishwasher. She has a small amount of crusty blood on ear.  Lindsay Mosher,PA: If she is actively bleeding maybe she needs er, especially ears bleeding?!  Manuela Schwartz Phy,RPH: last platelets were good on 9/21, but definitely needs a repeat cbc  asap.  on aspirin, plavix and duloxetine which could increase risk for bleeding.  Vida Roller should we refer to ED or bring in for lab check this afternoon and go from there?  Diallo Ponder,RN:  Pt's daughter calls to report that pt is having some bleeding since chemo. Her ears have been bleeding for a couple of days (about a 1 tsp), yesterday she started having blood and mucous in her poop (about 1 Tbsp), and today she had a nose bleed. The nosebleed stopped with pinching of her nose. Pt is having some dizziness/light headiness, & occasional SOB. She also fell yesterday in bathroom - no injury. I sent above message to [facility], and Manuela Schwartz [facility] @ [id number].

## 2022-05-17 NOTE — Progress Notes (Signed)
[facility]  [address] [place],  [place]  [address] [phone]  Clinic Day:  05/18/22  Referring physician: Marco Collie, MD   ASSESSMENT & PLAN:  Stage IA Left breast cancer This is a grade 3 triple negative 21 mm tumor for a T1cN0M0 with a Ki-67 of 40%. She has had resection with clear margins and negative sentinel node. However, I do recommend adjuvant chemotherapy and she is agreeable. Given her age and comorbidities, I will likely go with docetaxol and cytoxan every 3 weeks for 4 cycles and avoid anthracyclines.    Hypokalemia Her potassium has dropped from 3.4 to 3.0 so I will give IV supplement of 40 meq and start her on 20 meq oral daily.  Dehydration She is having watery diarrhea and has become dehydrated. Her BUN is up to 37 from 11, with an elevated creatinine of 1.3.  I will order IV fluids with 1 liter of NS and check her again next week.  Bilateral otitis media She does have a small amount of blood of the left ear canal, as well as report of epistaxis. Her daughter has stopped her Plavix. I will place her on Cipro 500 mg twice daily for 1 week.  Leukocytosis This is from her Neulasta  Her white count is 33,500.  Neuropathy She complains of numbness and pain of both legs that she rates as a 9/10. She also complains of back pain, which could be related to the Neulasta.  Family history of breast cancer and BRCA positivity Yet she is BRCA negative.  Lung nodule This is a ground glass nodule of the right upper lobe which has shown indolent growth from 1.8 cm in April of 2019 to 2.2 cm currently. We will plan to monitor this with repeat imaging periodically.  Liver cirrhosis Liver is decreased in attenuation diffusely with mildly irregular margins. I don't think she was aware of this diagnosis but it may explain her increased toxicities.  Pseudoseizures I witnessed one of these episodes during her initial examination and she  immediately recovered when her daughter prompted her.  Memory impairment This is consistent with early dementia. She also has significant hearing impairment.   We will plan close follow up during chemotherapy due to her poor tolerance.She will receive IV fluids and potassium supplement and I will start her on oral potassium supplement as well. I will prescribe Cipro for her otitis media. We will see her back in 1 week with repeat CBC and CMP.  I discussed the assessment and treatment plan with the patient and her family.  The patient was provided an opportunity to ask questions and all were answered.  The patient agreed with the plan and demonstrated an understanding of the instructions.  The patient was advised to call back if the symptoms worsen or if the condition fails to improve as anticipated.   I provided 30 minutes of face-to-face time during this this encounter and > 50% was spent counseling as documented under my assessment and plan.   Derwood Kaplan, MD [facility] [address] [place] [address] Dept: [phone] Dept Fax: [phone]     CHIEF COMPLAINT:  CC: Invasive ductal carcinoma  Current Treatment:  chemotherapy   HISTORY OF PRESENT ILLNESS:   Oncology History  Breast cancer of upper-outer quadrant of left female breast ([facility])  03/18/2022 Initial Diagnosis   Breast cancer of upper-outer quadrant of left female breast ([facility])  04/28/2022 Cancer Staging   Staging form: Breast, AJCC 8th Edition - Clinical stage from 04/28/2022: Stage IB (cT1c, cN0(sn), cM0, G3, ER-, PR-, HER2-) - Signed by Derwood Kaplan, MD on 04/28/2022 Histopathologic type: Infiltrating duct carcinoma, NOS Stage prefix: Initial diagnosis Method of lymph node assessment: Sentinel lymph node biopsy Nuclear grade: G3 Multigene prognostic tests performed: None Histologic grading system: 3 grade system Laterality:  Left Tumor size (mm): 20 Lymph-vascular invasion (LVI): LVI not present (absent)/not identified Diagnostic confirmation: Positive histology Specimen type: Excision Staged by: Managing physician Menopausal status: Postmenopausal Ki-67 (%): 40 Stage used in treatment planning: Yes National guidelines used in treatment planning: Yes Type of national guideline used in treatment planning: NCCN   05/07/2022 -  Chemotherapy   Patient is on Treatment Plan : BREAST TC q21d     05/19/2022 Genetic Testing   Negative hereditary cancer genetic testing: no pathogenic variant detected in [facility] Common Hereditary Cancers +RNA Panel.  Report date is May 19, 2022.    The Common Hereditary Cancers + RNA Panel offered by [facility] includes sequencing, deletion/duplication, and RNA testing of the following 47 genes: APC, ATM, AXIN2, BARD1, BMPR1A, BRCA1, BRCA2, BRIP1, CDH1, CDK4*, CDKN2A (p14ARF)*, CDKN2A (p16INK4a)*, CHEK2, CTNNA1, DICER1, EPCAM (Deletion/duplication testing only), GREM1 (promoter region deletion/duplication testing only), KIT, MEN1, MLH1, MSH2, MSH3, MSH6, MUTYH, NBN, NF1, NHTL1, PALB2, PDGFRA*, PMS2, POLD1, POLE, PTEN, RAD50, RAD51C, RAD51D, SDHB, SDHC, SDHD, SMAD4, SMARCA4. STK11, TP53, TSC1, TSC2, and VHL.  The following genes were evaluated for sequence changes only: SDHA and HOXB13 c.251G>A variant only.  RNA analysis is not performed for the * genes.     Elayne is a 71 year old woman who had a screening mammogram on 01/27/2022 and was found to have a possible left breast mass. She does get yearly mammograms due to her strong family history. She thought  she could feel a lump after this was found. She had a diagnostic mammogram on 02/11/2022 with a persistent spiculated hyperdense mass with associated calcifications in the upper inner quadrant. Ultrasound confirmed an irregular hypoechoic mass with peripheral vascularity at 11:30, 3 cm from the nipple. This measures 2.2 cm and the left axilla  was negative. She was referred to [name] after a biopsy was done on 02/17/2022, which confirmed a grade 3 invasive ductal carcinoma which is triple negative with a Ki-67 of 40%. [name] took her to surgery on 03/18/2022 for lumpectomy and sentinel lymph node. The final pathology revealed a 20 mm grade 3 invasive ductal carcinoma with DCIS. The margins were clear and the lymph node was negative for a T1cN0M0, stage IA. She has had a port placed.   INTERVAL HISTORY:  Lindsay Brooks is seen here today for recently diagnosed invasive ductal carcinoma, after her first cycle of TC chemotherapy. She has done very poorly. She complains of watery diarrhea and poor appetite. She has had 2 falls due to leg weakness. She complains of pain and numbness of both legs that she rates as a 9/10. She complains of back pain, which may be related to the Neulasta. She notes epistaxis and bleeding from her ears, and her daughter has stopped her Plavix.  I scheduled a CT chest for staging, and this reveals a 2.2 cm ground glass nodule in the right upper lobe, enlarged from 1.8 cm in April of 2019.  A nuclear bone scan for staging appears negative for metastasis. She has a focal abnormal tracer at the left temporo-occipital calvarium which corresponds to a focus of abnormal marrow  signal seen on prior MRI of the head in 2010 and is likely post traumatic. She also has uptake at the left tibial metaphysis medially which is non-specific. She denies fever, chills, night sweats, or other signs of infection. She denies cardiorespiratory and gastrointestinal issues. She  denies pain. Her weight today has decreased by over 5 pounds. CBC reveals a WBC of 33,500, presumably from her Neulasta. Her potassium has decreased from 3.4 to 3.0 and her Creatinine increased from 1.1 to 1.3 with a BUN of 37.  SOCIAL HISTORY She lives with her first daughter, who is present at today's visit. She has  2 daughters. She smokes 1.5 packs a day. Has been smoking  since 71 yo. She is not sure about quitting. She did smoke 2 packs per day for over 50 years. She doesn't drink other than rare occasion She is widowed She has been married 4 times She is disabled  Menstrual History 71 years old when she had her first child She has had 3 children 9 years old when starting menstrual period Menopause started at 65 She has had a partial hysterectomy in her 43's and went back for complete hysterectomy/BSO later. No hormone replacement therapy  FAMILY HISTORY Daughter had breast cancer at age 64 Aunt had breast cancer at age 28 Her sister had a brain tumor at age 4   SURGICAL HISTORY She had a partial Hysterectomy in her late 74's, and went back a few years later to have BSO. Tonsillectomy cataract surgery  Stents of the iliac and femoral arteries. Nerve repair of the left upper extremity  PAST MEDICAL HISTORY Osteoporosis Coronary artery disease COPD Diabetes  Migraine headaches Hyperlipidemia Hypertension TIA Anxiety GERD Peripheral vascular disease Hard of hearing    REVIEW OF SYSTEMS:  Review of Systems  Constitutional: Negative.   HENT:  Negative.    Eyes: Negative.   Respiratory: Negative.    Gastrointestinal: Negative.   Endocrine: Negative.   Musculoskeletal: Negative.   Skin: Negative.   Neurological: Negative.        There is a question of possible early dementia since she has memory impairment  Hematological: Negative.   Psychiatric/Behavioral: Negative.      VITALS:  Blood pressure 108/63, pulse (!) 112, temperature (!) 97.4 F (36.3 C), temperature source Oral, resp. rate 20, height 5' 1" (1.549 m), weight 133 lb 9.6 oz (60.6 kg), SpO2 96 %.  Wt Readings from Last 3 Encounters:  06/03/22 131 lb 1.9 oz (59.5 kg)  06/01/22 134 lb 0.6 oz (60.8 kg)  05/26/22 136 lb 3.2 oz (61.8 kg)    Body mass index is 25.24 kg/m.  Performance status (ECOG): 0 - Asymptomatic  PHYSICAL EXAM:  Physical Exam Constitutional:       General: She is not in acute distress.    Appearance: Normal appearance. She is normal weight. She is not ill-appearing or toxic-appearing.  HENT:     Head: Normocephalic and atraumatic.     Ears:     Comments: Bilateral otitis media with a small amount of blood in the left ear canal    Nose: Nose normal.     Mouth/Throat:     Mouth: Mucous membranes are dry.  Eyes:     General: No scleral icterus.    Extraocular Movements: Extraocular movements intact.     Conjunctiva/sclera: Conjunctivae normal.     Pupils: Pupils are equal, round, and reactive to light.  Cardiovascular:     Rate and Rhythm: Normal rate and regular  rhythm.     Pulses: Normal pulses.     Heart sounds: Normal heart sounds. No murmur heard.    No friction rub. No gallop.  Pulmonary:     Effort: Pulmonary effort is normal. No respiratory distress.     Breath sounds: Normal breath sounds. No wheezing or rales.  Chest:     Chest wall: No mass.  Breasts:    Right: Normal. No mass.     Left: No mass.     Comments: Dye injection reaction at 12 and 3 o'clock at the other two die injections at 9 and 6 o'clock incision is healing very well and is slightly in the upper inner quadrant. Another incision in low left axilla which is healing well.  Port site is healing well in the right upper chest. Abdominal:     General: Bowel sounds are normal. There is no distension.     Palpations: Abdomen is soft. There is no hepatomegaly, splenomegaly or mass.     Tenderness: There is no abdominal tenderness.  Musculoskeletal:        General: Normal range of motion.     Cervical back: Normal range of motion and neck supple.     Right lower leg: No edema.     Left lower leg: No edema.  Lymphadenopathy:     Cervical: No cervical adenopathy.  Skin:    General: Skin is warm and dry.  Neurological:     General: No focal deficit present.     Mental Status: She is alert and oriented to person, place, and time. Mental status is at  baseline.  Psychiatric:        Mood and Affect: Mood normal.        Behavior: Behavior normal.        Thought Content: Thought content normal.        Judgment: Judgment normal.   LABS:      Latest Ref Rng & Units 05/26/2022   12:00 AM 05/18/2022   12:00 AM 04/30/2022   12:00 AM  CBC  WBC  19.1     33.5     10.3      Hemoglobin 12.0 - 16.0 12.2     14.9     13.9      Hematocrit 36 - 46 37     45     42      Platelets 150 - 400 K/uL 238     247     215         This result is from an external source.      Latest Ref Rng & Units 05/26/2022   12:00 AM 05/18/2022   12:00 AM 04/30/2022   12:00 AM  CMP  BUN 4 - 21 15     37     11      Creatinine 0.5 - 1.1 1.0     1.3     1.1      Sodium 137 - 147 136     138     134      Potassium 3.5 - 5.1 mEq/L 3.6     3.0     3.4      Chloride 99 - 108 104     103     100      CO2 13 - _0 Calcium 8.7 - 10.7 8.6  9.0     9.3      Total Protein 6.3 - 8.2 g/dL 6        Alkaline Phos 25 - 125 71     120     89      AST 13 - 35 _0 ALT 7 - 35 U/L _1 This result is from an external source.     No results found for: "CEA1", "CEA" / No results found for: "CEA1", "CEA" No results found for: "PSA1" No results found for: "PJR939" No results found for: "CAN125"  No results found for: "TOTALPROTELP", "ALBUMINELP", "A1GS", "A2GS", "BETS", "BETA2SER", "GAMS", "MSPIKE", "SPEI" No results found for: "TIBC", "FERRITIN", "IRONPCTSAT" No results found for: "LDH"   STUDIES:   EXAM:03/18/2022 REPORT SURGICAL PATHOLOGY DIAGNOSIS Breast, lumpectomy, Left UOQ Invasive ductal carcinoma, grade 3, and ductal carcinoma in situ Ribbon clip and biopsy site present All margins negative for invasive carcinoma and DCIS Fibrocystic changes Lymph node, sentinel, biopsy One lymph node negative for tumor (0/1)  Estrogen receptor: 0% Progesterone receptor: 0% HER2 negative at 1+ Proliferation  marker Ki-67: 40%  Bone density scan 01/27/2022 1.  Osteoporosis of the spine with a T score of -2.5, worse 2.  Left femur - osteoporosis with a T score of -2.7, slightly better 3.  Dual femur total mean-osteoporosis with a T score of -2.5, worse   Allergies:  Allergies  Allergen Reactions   Amoxicillin Hives    vomiting   Celecoxib Hives   Claritin [Loratadine] Other (See Comments)    Pt's daughter notified us of this allergy.   Insulins Nausea And Vomiting    Patient unsure of name of insulin, will call us back   Vioxx [Rofecoxib] Hives   Liraglutide Nausea And Vomiting   Propranolol Nausea And Vomiting    diarrhea diarrhea diarrhea     Current Medications: Current Outpatient Medications  Medication Sig Dispense Refill   aspirin EC 81 MG tablet Take 81 mg by mouth daily.     atenolol (TENORMIN) 50 MG tablet Take 75 mg by mouth 2 (two) times daily.      busPIRone (BUSPAR) 7.5 MG tablet Take 1 tablet by mouth in the morning, at noon, and at bedtime.     dexamethasone (DECADRON) 4 MG tablet Take 2 tabs by mouth 2 times daily starting day before chemo. Then take 2 tabs daily for 2 days starting day after chemo. Take with food. 30 tablet 1   DULoxetine (CYMBALTA) 60 MG capsule Take 120 mg by mouth at bedtime.      famotidine (PEPCID) 20 MG tablet Take 20 mg by mouth 2 (two) times daily.     fenofibrate (TRICOR) 145 MG tablet Take 145 mg by mouth daily.     fexofenadine (ALLEGRA) 180 MG tablet Take 180 mg by mouth daily.     gabapentin (NEURONTIN) 600 MG tablet Take 600 mg by mouth 3 (three) times daily.     hydrALAZINE (APRESOLINE) 50 MG tablet Take 50 mg by mouth 3 (three) times daily.     lidocaine-prilocaine (EMLA) cream Apply to affected area once 30 g 3   LORazepam (ATIVAN) 0.5 MG tablet Take 1 tablet (0.5 mg total) by mouth at bedtime as needed for sleep. May take one tab prior to imaging studies due to anxiety 30  tablet 0   Melatonin 1 MG CAPS Take by mouth. (Patient not  taking: Reported on 04/30/2022)     metoprolol tartrate (LOPRESSOR) 25 MG tablet Take 25 mg by mouth 2 (two) times daily.     nicotine (NICODERM CQ - DOSED IN MG/24 HOURS) 21 mg/24hr patch Place 1 patch (21 mg total) onto the skin daily. 28 patch 0   nitroGLYCERIN (NITROSTAT) 0.4 MG SL tablet Place 0.4 mg under the tongue every 5 (five) minutes as needed. For chest pain     ondansetron (ZOFRAN-ODT) 4 MG disintegrating tablet Take 4 mg by mouth every 6 (six) hours as needed.     oxyCODONE (OXY IR/ROXICODONE) 5 MG immediate release tablet Take 5 mg by mouth every 6 (six) hours as needed.     pantoprazole (PROTONIX) 40 MG tablet Take 40 mg by mouth 2 (two) times daily.     prochlorperazine (COMPAZINE) 10 MG tablet Take 1 tablet (10 mg total) by mouth every 6 (six) hours as needed for nausea or vomiting. 30 tablet 1   tirzepatide (MOUNJARO) 2.5 MG/0.5ML Pen INJECT 2.5 MILLIGRAM EVERY WEEK     TRESIBA FLEXTOUCH 100 UNIT/ML FlexTouch Pen Inject into the skin.     valsartan-hydrochlorothiazide (DIOVAN-HCT) 320-25 MG tablet TAKE 1 TABLET BY ORAL ROUTE ONCE DAILY FOR HIGH BLOOD PRESSURE     zolpidem (AMBIEN) 10 MG tablet Take 10 mg by mouth at bedtime as needed for sleep.  (Patient not taking: Reported on 04/30/2022)     No current facility-administered medications for this visit.    I,Gabriella Ballesteros,acting as a scribe for Derwood Kaplan, MD.,have documented all relevant documentation on the behalf of Derwood Kaplan, MD,as directed by  Derwood Kaplan, MD while in the presence of Derwood Kaplan, MD.

## 2022-05-18 ENCOUNTER — Inpatient Hospital Stay: Payer: Medicare Other

## 2022-05-18 ENCOUNTER — Inpatient Hospital Stay (INDEPENDENT_AMBULATORY_CARE_PROVIDER_SITE_OTHER): Payer: Medicare Other | Admitting: Oncology

## 2022-05-18 ENCOUNTER — Encounter: Payer: Self-pay | Admitting: Oncology

## 2022-05-18 ENCOUNTER — Other Ambulatory Visit: Payer: Self-pay | Admitting: Oncology

## 2022-05-18 VITALS — BP 108/63 | HR 112 | Temp 97.4°F | Resp 20 | Ht 61.0 in | Wt 133.6 lb

## 2022-05-18 DIAGNOSIS — Z171 Estrogen receptor negative status [ER-]: Secondary | ICD-10-CM

## 2022-05-18 DIAGNOSIS — C50412 Malignant neoplasm of upper-outer quadrant of left female breast: Secondary | ICD-10-CM

## 2022-05-18 DIAGNOSIS — R7989 Other specified abnormal findings of blood chemistry: Secondary | ICD-10-CM | POA: Insufficient documentation

## 2022-05-18 DIAGNOSIS — H66003 Acute suppurative otitis media without spontaneous rupture of ear drum, bilateral: Secondary | ICD-10-CM

## 2022-05-18 DIAGNOSIS — E876 Hypokalemia: Secondary | ICD-10-CM | POA: Insufficient documentation

## 2022-05-18 LAB — BASIC METABOLIC PANEL
BUN: 37 — AB (ref 4–21)
CO2: 21 (ref 13–22)
Chloride: 103 (ref 99–108)
Creatinine: 1.3 — AB (ref 0.5–1.1)
Glucose: 119
Potassium: 3 mEq/L — AB (ref 3.5–5.1)
Sodium: 138 (ref 137–147)

## 2022-05-18 LAB — CBC: RBC: 4.97 (ref 3.87–5.11)

## 2022-05-18 LAB — COMPREHENSIVE METABOLIC PANEL
Albumin: 3.9 (ref 3.5–5.0)
Calcium: 9 (ref 8.7–10.7)

## 2022-05-18 LAB — HEPATIC FUNCTION PANEL
ALT: 22 U/L (ref 7–35)
AST: 31 (ref 13–35)
Alkaline Phosphatase: 120 (ref 25–125)
Bilirubin, Total: 0.4

## 2022-05-18 LAB — CBC AND DIFFERENTIAL
HCT: 45 (ref 36–46)
Hemoglobin: 14.9 (ref 12.0–16.0)
Neutrophils Absolute: 22.11
Platelets: 247 10*3/uL (ref 150–400)
WBC: 33.5

## 2022-05-18 MED ORDER — CIPROFLOXACIN HCL 500 MG PO TABS: 500.0000 mg | ORAL_TABLET | Freq: Two times a day (BID) | ORAL | 0 refills | Status: AC

## 2022-05-18 NOTE — Progress Notes (Signed)
Face to face contact with pt in [place]. Pt is accompanied by her boyfriend. Pt c/o weakness today. Pt is here to see Dr. Hinton Rao.

## 2022-05-19 ENCOUNTER — Encounter: Payer: Self-pay | Admitting: Genetic Counselor

## 2022-05-19 DIAGNOSIS — Z1379 Encounter for other screening for genetic and chromosomal anomalies: Secondary | ICD-10-CM | POA: Insufficient documentation

## 2022-05-21 DIAGNOSIS — R0603 Acute respiratory distress: Secondary | ICD-10-CM | POA: Diagnosis not present

## 2022-05-21 DIAGNOSIS — U071 COVID-19: Secondary | ICD-10-CM | POA: Diagnosis not present

## 2022-05-22 ENCOUNTER — Encounter: Payer: Self-pay | Admitting: Oncology

## 2022-05-22 ENCOUNTER — Inpatient Hospital Stay: Payer: Medicare Other

## 2022-05-22 VITALS — BP 116/94 | HR 120 | Temp 98.3°F | Resp 18 | Ht 61.0 in | Wt 135.4 lb

## 2022-05-22 DIAGNOSIS — Z5111 Encounter for antineoplastic chemotherapy: Secondary | ICD-10-CM | POA: Diagnosis not present

## 2022-05-22 DIAGNOSIS — Z171 Estrogen receptor negative status [ER-]: Secondary | ICD-10-CM | POA: Diagnosis not present

## 2022-05-22 DIAGNOSIS — Z1502 Genetic susceptibility to malignant neoplasm of ovary: Secondary | ICD-10-CM | POA: Diagnosis not present

## 2022-05-22 DIAGNOSIS — Z5189 Encounter for other specified aftercare: Secondary | ICD-10-CM | POA: Diagnosis not present

## 2022-05-22 DIAGNOSIS — E860 Dehydration: Secondary | ICD-10-CM | POA: Diagnosis not present

## 2022-05-22 DIAGNOSIS — H9190 Unspecified hearing loss, unspecified ear: Secondary | ICD-10-CM | POA: Diagnosis not present

## 2022-05-22 DIAGNOSIS — R7989 Other specified abnormal findings of blood chemistry: Secondary | ICD-10-CM

## 2022-05-22 DIAGNOSIS — Z803 Family history of malignant neoplasm of breast: Secondary | ICD-10-CM | POA: Diagnosis not present

## 2022-05-22 DIAGNOSIS — D72829 Elevated white blood cell count, unspecified: Secondary | ICD-10-CM | POA: Diagnosis not present

## 2022-05-22 DIAGNOSIS — Z23 Encounter for immunization: Secondary | ICD-10-CM | POA: Diagnosis not present

## 2022-05-22 DIAGNOSIS — K7460 Unspecified cirrhosis of liver: Secondary | ICD-10-CM | POA: Diagnosis not present

## 2022-05-22 DIAGNOSIS — G629 Polyneuropathy, unspecified: Secondary | ICD-10-CM | POA: Diagnosis not present

## 2022-05-22 DIAGNOSIS — E876 Hypokalemia: Secondary | ICD-10-CM

## 2022-05-22 DIAGNOSIS — Z1501 Genetic susceptibility to malignant neoplasm of breast: Secondary | ICD-10-CM | POA: Diagnosis not present

## 2022-05-22 DIAGNOSIS — R918 Other nonspecific abnormal finding of lung field: Secondary | ICD-10-CM | POA: Diagnosis not present

## 2022-05-22 DIAGNOSIS — F0390 Unspecified dementia without behavioral disturbance: Secondary | ICD-10-CM | POA: Diagnosis not present

## 2022-05-22 DIAGNOSIS — C50412 Malignant neoplasm of upper-outer quadrant of left female breast: Secondary | ICD-10-CM | POA: Diagnosis not present

## 2022-05-22 DIAGNOSIS — H6693 Otitis media, unspecified, bilateral: Secondary | ICD-10-CM | POA: Diagnosis not present

## 2022-05-22 DIAGNOSIS — Z1509 Genetic susceptibility to other malignant neoplasm: Secondary | ICD-10-CM | POA: Diagnosis not present

## 2022-05-22 DIAGNOSIS — R569 Unspecified convulsions: Secondary | ICD-10-CM | POA: Diagnosis not present

## 2022-05-22 MED ORDER — INFLUENZA VAC SPLIT QUAD 0.5 ML IM SUSY
0.5000 mL | PREFILLED_SYRINGE | Freq: Once | INTRAMUSCULAR | Status: AC
  Administered 2022-05-22: 0.5 mL via INTRAMUSCULAR
  Filled 2022-05-22: qty 0.5

## 2022-05-22 MED ORDER — SODIUM CHLORIDE 0.9 % IV SOLN: Freq: Once | INTRAVENOUS | Status: AC

## 2022-05-22 MED ORDER — HEPARIN SOD (PORK) LOCK FLUSH 100 UNIT/ML IV SOLN
500.0000 [IU] | Freq: Once | INTRAVENOUS | Status: AC | PRN
  Administered 2022-05-22: 500 [IU]

## 2022-05-22 MED ORDER — POTASSIUM CHLORIDE 10 MEQ/100ML IV SOLN
10.0000 meq | INTRAVENOUS | Status: AC
  Administered 2022-05-22 (×4): 10 meq via INTRAVENOUS
  Filled 2022-05-22 (×4): qty 100

## 2022-05-22 MED ORDER — ALTEPLASE 2 MG IJ SOLR: 2.0000 mg | Freq: Once | INTRAMUSCULAR | Status: DC | PRN

## 2022-05-22 NOTE — Patient Instructions (Signed)
Hypokalemia Hypokalemia means that the amount of potassium in the blood is lower than normal. Potassium is a mineral (electrolyte) that helps regulate the amount of fluid in the body. It also stimulates muscle tightening (contraction) and helps nerves work properly. Normally, most of the body's potassium is inside cells, and only a very small amount is in the blood. Because the amount in the blood is so small, minor changes to potassium levels in the blood can be life-threatening. What are the causes? This condition may be caused by: Antibiotic medicine. Diarrhea or vomiting. Taking too much of a medicine that helps you have a bowel movement (laxative) can cause diarrhea and lead to hypokalemia. Chronic kidney disease (CKD). Medicines that help the body get rid of excess fluid (diuretics). Eating disorders, such as anorexia or bulimia. Low magnesium levels in the body. Sweating a lot. What are the signs or symptoms? Symptoms of this condition include: Weakness. Constipation. Fatigue. Muscle cramps. Mental confusion. Skipped heartbeats or irregular heartbeat (palpitations). Tingling or numbness. How is this diagnosed? This condition is diagnosed with a blood test. How is this treated? This condition may be treated by: Taking potassium supplements. Adjusting the medicines that you take. Eating more foods that contain a lot of potassium. If your potassium level is very low, you may need to get potassium through an IV and be monitored in the hospital. Follow these instructions at home: Eating and drinking  Eat a healthy diet. A healthy diet includes fresh fruits and vegetables, whole grains, healthy fats, and lean proteins. If told, eat more foods that contain a lot of potassium. These include: Nuts, such as peanuts and pistachios. Seeds, such as sunflower seeds and pumpkin seeds. Peas, lentils, and lima beans. Whole grain and bran cereals and breads. Fresh fruits and vegetables,  such as apricots, avocado, bananas, cantaloupe, kiwi, oranges, tomatoes, asparagus, and potatoes. Juices, such as orange, tomato, and prune. Lean meats, including fish. Milk and milk products, such as yogurt. General instructions Take over-the-counter and prescription medicines only as told by your health care provider. This includes vitamins, natural food products, and supplements. Keep all follow-up visits. This is important. Contact a health care provider if: You have weakness that gets worse. You feel your heart pounding or racing. You vomit. You have diarrhea. You have diabetes and you have trouble keeping your blood sugar in your target range. Get help right away if: You have chest pain. You have shortness of breath. You have vomiting or diarrhea that lasts for more than 2 days. You faint. These symptoms may be an emergency. Get help right away. Call 911. Do not wait to see if the symptoms will go away. Do not drive yourself to the hospital. Summary Hypokalemia means that the amount of potassium in the blood is lower than normal. This condition is diagnosed with a blood test. Hypokalemia may be treated by taking potassium supplements, adjusting the medicines that you take, or eating more foods that are high in potassium. If your potassium level is very low, you may need to get potassium through an IV and be monitored in the hospital. This information is not intended to replace advice given to you by your health care provider. Make sure you discuss any questions you have with your health care provider. Document Revised: 04/10/2021 Document Reviewed: 04/10/2021 Elsevier Patient Education  2023 [employer] Inc[employer]  

## 2022-05-26 ENCOUNTER — Ambulatory Visit: Admitting: Dietician

## 2022-05-26 ENCOUNTER — Encounter: Payer: Self-pay | Admitting: Hematology and Oncology

## 2022-05-26 ENCOUNTER — Inpatient Hospital Stay (INDEPENDENT_AMBULATORY_CARE_PROVIDER_SITE_OTHER): Payer: Medicare Other | Admitting: Hematology and Oncology

## 2022-05-26 ENCOUNTER — Inpatient Hospital Stay: Payer: Medicare Other

## 2022-05-26 DIAGNOSIS — D649 Anemia, unspecified: Secondary | ICD-10-CM | POA: Diagnosis not present

## 2022-05-26 DIAGNOSIS — Z171 Estrogen receptor negative status [ER-]: Secondary | ICD-10-CM | POA: Diagnosis not present

## 2022-05-26 DIAGNOSIS — C50412 Malignant neoplasm of upper-outer quadrant of left female breast: Secondary | ICD-10-CM

## 2022-05-26 LAB — BASIC METABOLIC PANEL
BUN: 15 (ref 4–21)
CO2: 27 — AB (ref 13–22)
Chloride: 104 (ref 99–108)
Creatinine: 1 (ref 0.5–1.1)
Glucose: 255
Potassium: 3.6 mEq/L (ref 3.5–5.1)
Sodium: 136 — AB (ref 137–147)

## 2022-05-26 LAB — CBC AND DIFFERENTIAL
HCT: 37 (ref 36–46)
Hemoglobin: 12.2 (ref 12.0–16.0)
Neutrophils Absolute: 14.52
Platelets: 238 10*3/uL (ref 150–400)
WBC: 19.1

## 2022-05-26 LAB — HEPATIC FUNCTION PANEL
ALT: 18 U/L (ref 7–35)
AST: 20 (ref 13–35)
Alkaline Phosphatase: 71 (ref 25–125)
Bilirubin, Total: 0.4

## 2022-05-26 LAB — COMPREHENSIVE METABOLIC PANEL
Albumin: 3.3 — AB (ref 3.5–5.0)
Calcium: 8.6 — AB (ref 8.7–10.7)

## 2022-05-26 LAB — CBC: RBC: 3.98 (ref 3.87–5.11)

## 2022-05-26 NOTE — Assessment & Plan Note (Signed)
Stage IB triple negative breast cancer.  She tolerated her first cycle of docetaxel/cyclophosphamide poorly.  She required IV fluids and potassium and was treated for possible ear infection.  She had a deal of leg pain which could have been done to docetaxel or pegfilgrastim.  This was treated with additional days of dexamethasone.  The patient is unable to take Claritin for the bone pain.   The patient is willing to proceed with a another cycle of chemotherapy at a reduced dose.  Per Dr. Hinton Rao, we will do an a 20% dose reduction of her chemotherapy.  She still may have difficulty with pain due to PEG filgrastim, but we feel that she needs growth factor.  We will plan to proceed with treatment next week and see her back about 10 days later for continued supportive care.

## 2022-05-26 NOTE — Progress Notes (Signed)
[facility]  [address] [place],  [place]  [id number] [phone]  Clinic Day:  05/26/2022  Referring physician: Marco Collie, MD  ASSESSMENT & PLAN:   Assessment & Plan: Breast cancer of upper-outer quadrant of left female breast ([place]) Stage IB triple negative breast cancer.  She tolerated her first cycle of docetaxel/cyclophosphamide poorly.  She required IV fluids and potassium and was treated for possible ear infection.  She had a deal of leg pain which could have been done to docetaxel or pegfilgrastim.  This was treated with additional days of dexamethasone.  The patient is unable to take Claritin for the bone pain.   The patient is willing to proceed with a another cycle of chemotherapy at a reduced dose.  Per Dr. Hinton Rao, we will do an a 20% dose reduction of her chemotherapy.  She still may have difficulty with pain due to PEG filgrastim, but we feel that she needs growth factor.  We will plan to proceed with treatment next week and see her back about 10 days later for continued supportive care.   The patient understands the plans discussed today and is in agreement with them.  She knows to contact our office if she develops concerns prior to her next appointment.   I provided 20 minutes of face-to-face time during this encounter and > 50% was spent counseling as documented under my assessment and plan.    Lindsay Pickles, PA-C  [facility] AT [facility] [address] [place] [address] Dept: [phone] Dept Fax: [phone]   Orders Placed This Encounter  Procedures   CBC and differential    This external order was created through the Results Console.   CBC    This external order was created through the Results Console.   Basic metabolic panel    This external order was created through the Results Console.   Comprehensive metabolic panel    This external order was created  through the Results Console.   Hepatic function panel    This external order was created through the Results Console.   Protein, total    This order was created through External Result Entry   Corrected Calcium    This order was created through External Result Entry      CHIEF COMPLAINT:  CC: Stage IB triple negative breast cancer  Current Treatment: Docetaxel/cyclophosphamide every 3 weeks  HISTORY OF PRESENT ILLNESS:   Oncology History  Breast cancer of upper-outer quadrant of left female breast ([place])  03/18/2022 Initial Diagnosis   Breast cancer of upper-outer quadrant of left female breast ([place])   04/28/2022 Cancer Staging   Staging form: Breast, AJCC 8th Edition - Clinical stage from 04/28/2022: Stage IB (cT1c, cN0(sn), cM0, G3, ER-, PR-, HER2-) - Signed by Derwood Kaplan, MD on 04/28/2022 Histopathologic type: Infiltrating duct carcinoma, NOS Stage prefix: Initial diagnosis Method of lymph node assessment: Sentinel lymph node biopsy Nuclear grade: G3 Multigene prognostic tests performed: None Histologic grading system: 3 grade system Laterality: Left Tumor size (mm): 20 Lymph-vascular invasion (LVI): LVI not present (absent)/not identified Diagnostic confirmation: Positive histology Specimen type: Excision Staged by: Managing physician Menopausal status: Postmenopausal Ki-67 (%): 40 Stage used in treatment planning: Yes National guidelines used in treatment planning: Yes Type of national guideline used in treatment planning: NCCN   05/07/2022 -  Chemotherapy   Patient is on Treatment Plan : BREAST TC q21d     05/19/2022 Genetic  Testing   Negative hereditary cancer genetic testing: no pathogenic variant detected in [facility] Common Hereditary Cancers +RNA Panel.  Report date is May 19, 2022.    The Common Hereditary Cancers + RNA Panel offered by [facility] includes sequencing, deletion/duplication, and RNA testing of the following 47 genes: APC, ATM, AXIN2,  BARD1, BMPR1A, BRCA1, BRCA2, BRIP1, CDH1, CDK4*, CDKN2A (p14ARF)*, CDKN2A (p16INK4a)*, CHEK2, CTNNA1, DICER1, EPCAM (Deletion/duplication testing only), GREM1 (promoter region deletion/duplication testing only), KIT, MEN1, MLH1, MSH2, MSH3, MSH6, MUTYH, NBN, NF1, NHTL1, PALB2, PDGFRA*, PMS2, POLD1, POLE, PTEN, RAD50, RAD51C, RAD51D, SDHB, SDHC, SDHD, SMAD4, SMARCA4. STK11, TP53, TSC1, TSC2, and VHL.  The following genes were evaluated for sequence changes only: SDHA and HOXB13 c.251G>A variant only.  RNA analysis is not performed for the * genes.         INTERVAL HISTORY:  Lindsay Brooks is here today for repeat clinical assessment prior to potential second cycle of docetaxel/cyclophosphamide.  She had much difficulty tolerating her first cycle.  She had anorexia, diarrhea, hypokalemia and bleeding from her ears.  She received IV fluids and potassium she had to severe leg pain.  She was given extra dexamethasone to treat this.  She states the bone pain has improved, but reports persistent muscle pain.  She was treated for ear infection.  She uses hearing aids.  She denies further bleeding.  She reports 1 episode of nausea and vomiting since her last visit.  She denies fevers or chills. She denies pain. Her appetite is good. Her weight has been stable.  REVIEW OF SYSTEMS:  Review of Systems  Constitutional:  Negative for appetite change, chills, fatigue, fever and unexpected weight change.  HENT:   Negative for lump/mass, mouth sores and sore throat.   Respiratory:  Negative for cough and shortness of breath.   Cardiovascular:  Negative for chest pain and leg swelling.  Gastrointestinal:  Positive for nausea and vomiting. Negative for abdominal pain, constipation and diarrhea.  Endocrine: Negative for hot flashes.  Genitourinary:  Negative for difficulty urinating, dysuria, frequency and hematuria.   Musculoskeletal:  Positive for myalgias. Negative for arthralgias and back pain.  Skin:  Negative for rash.   Neurological:  Negative for dizziness and headaches.  Hematological:  Negative for adenopathy. Does not bruise/bleed easily.  Psychiatric/Behavioral:  Negative for depression and sleep disturbance. The patient is not nervous/anxious.      VITALS:  Blood pressure 111/60, pulse (!) 110, temperature 98.5 F (36.9 C), temperature source Oral, resp. rate 20, height _0  (1.549 m), weight 136 lb 3.2 oz (61.8 kg), SpO2 99 %.  Wt Readings from Last 3 Encounters:  05/26/22 136 lb 3.2 oz (61.8 kg)  05/22/22 135 lb 6.4 oz (61.4 kg)  05/18/22 133 lb 9.6 oz (60.6 kg)    Body mass index is 25.73 kg/m.  Performance status (ECOG): 2 - Symptomatic, <50% confined to bed  PHYSICAL EXAM:  Physical Exam Vitals and nursing note reviewed.  Constitutional:      General: She is not in acute distress.    Appearance: Normal appearance.  HENT:     Head: Normocephalic and atraumatic.     Mouth/Throat:     Mouth: Mucous membranes are moist.     Pharynx: Oropharynx is clear. No oropharyngeal exudate or posterior oropharyngeal erythema.  Eyes:     General: No scleral icterus.    Extraocular Movements: Extraocular movements intact.     Conjunctiva/sclera: Conjunctivae normal.     Pupils: Pupils are equal, round, and reactive to  light.  Cardiovascular:     Rate and Rhythm: Normal rate and regular rhythm.     Heart sounds: Normal heart sounds. No murmur heard.    No friction rub. No gallop.  Pulmonary:     Effort: Pulmonary effort is normal.     Breath sounds: Normal breath sounds. No wheezing, rhonchi or rales.  Abdominal:     General: There is no distension.     Palpations: Abdomen is soft. There is no hepatomegaly, splenomegaly or mass.     Tenderness: There is no abdominal tenderness.  Musculoskeletal:        General: Normal range of motion.     Cervical back: Normal range of motion and neck supple. No tenderness.     Right lower leg: No edema.     Left lower leg: No edema.  Lymphadenopathy:      Cervical: No cervical adenopathy.     Upper Body:     Right upper body: No supraclavicular or axillary adenopathy.     Left upper body: No supraclavicular or axillary adenopathy.     Lower Body: No right inguinal adenopathy. No left inguinal adenopathy.  Skin:    General: Skin is warm and dry.     Coloration: Skin is not jaundiced.     Findings: No rash.  Neurological:     Mental Status: She is alert and oriented to person, place, and time.     Cranial Nerves: No cranial nerve deficit.  Psychiatric:        Mood and Affect: Mood normal.        Behavior: Behavior normal.        Thought Content: Thought content normal.    LABS:      Latest Ref Rng & Units 05/26/2022   12:00 AM 05/18/2022   12:00 AM 04/30/2022   12:00 AM  CBC  WBC  19.1     33.5     10.3      Hemoglobin 12.0 - 16.0 12.2     14.9     13.9      Hematocrit 36 - 46 37     45     42      Platelets 150 - 400 K/uL 238     247     215         This result is from an external source.      Latest Ref Rng & Units 05/26/2022   12:00 AM 05/18/2022   12:00 AM 04/30/2022   12:00 AM  CMP  BUN 4 - 21 15     37     11      Creatinine 0.5 - 1.1 1.0     1.3     1.1      Sodium 137 - 147 136     138     134      Potassium 3.5 - 5.1 mEq/L 3.6     3.0     3.4      Chloride 99 - 108 104     103     100      CO2 13 - _0 Calcium 8.7 - 10.7 8.6     9.0     9.3      Total Protein 6.3 - 8.2 g/dL 6        Alkaline Phos 25 - 125 71  120     89      AST 13 - 35 _0 ALT 7 - 35 U/L _1 This result is from an external source.     No results found for: "CEA1", "CEA" / No results found for: "CEA1", "CEA" No results found for: "PSA1" No results found for: "FKC127" No results found for: "CAN125"  No results found for: "TOTALPROTELP", "ALBUMINELP", "A1GS", "A2GS", "BETS", "BETA2SER", "GAMS", "MSPIKE", "SPEI" No results found for: "TIBC", "FERRITIN", "IRONPCTSAT" No results  found for: "LDH"  STUDIES:  No results found.    HISTORY:   Past Medical History:  Diagnosis Date   CAD (coronary artery disease)    a. s/p 2 CoStar DES to RCA 06/2004. b.  LHC 12/09:  EF 65%, oCFX 40%, oRCA 30%, then 30-40% leading into stented seg that is patent, (10-20% ISR), 40% beyond stent,;  c. EF 81%, poor exercise capacity, hypotensive BP response, + chest pain, normal nuclear images (overall low risk)   CKD (chronic kidney disease)    Colon polyps    a. s/p removal 01/2012 - was told one might be cancerous but was not diagnosed with colon cancer - plan is for f/u colonoscopy in 5 yrs.   GERD (gastroesophageal reflux disease)    Hx of echocardiogram    a. Echo 06/2011: EF 55-60%, Gr 1 diast dysfn, mild MR   Insomnia    Mini stroke    History of, in 2005 (before she was diagnosed with CAD).   Osteoporosis    Other and unspecified hyperlipidemia    Other isolated or specific phobias    Peripheral vascular disease, unspecified ([place])    a. s/p L common femoral & left illiac stenting previously (patent by angio in 2007). - ABI 06/2011 normal. b. Carotid US 0-39% BICA 06/2011 (for f/u 2 yrs);   c.  ABIs 9/13:  normal (1.1 bilaterally)   Personal history of peptic ulcer disease    Remotely.   Psychiatric pseudoseizure    per patient and family   Tobacco use disorder    Type II or unspecified type diabetes mellitus without mention of complication, not stated as uncontrolled    Unspecified essential hypertension     Past Surgical History:  Procedure Laterality Date   APPENDECTOMY     BREAST BIOPSY     CARDIAC CATHETERIZATION  2009   patent RCA stent. mild CAD otherwise.    CATARACT EXTRACTION, BILATERAL  2016   CHOLECYSTECTOMY  2009   hysterectomy (other)     age 29   KNEE ARTHROSCOPY Left    left arm surgery     LEFT HEART CATHETERIZATION WITH CORONARY ANGIOGRAM N/A 01/16/2014   Procedure: LEFT HEART CATHETERIZATION WITH CORONARY ANGIOGRAM;  Surgeon: Blane Ohara,  MD;  Location[facility] CATH LAB;  Service: Cardiovascular;  Laterality: N/A;   left leg angiography     stenting of the left common iliac artery   left rotator cuff repair     right coronary artery stent      Family History  Problem Relation Age of Onset   Heart attack Mother    Allergies Mother    Heart attack Father    Allergies Father    Kidney disease Father    Hypertension Father    Coronary artery disease Brother  Breast cancer Maternal Aunt    Breast cancer Maternal Aunt    Breast cancer Paternal Aunt    Breast cancer Daughter     Social History:  reports that she has been smoking cigarettes. She has a 66.00 pack-year smoking history. She has never used smokeless tobacco. She reports that she does not drink alcohol and does not use drugs.The patient is accompanied by her daughter today.  Allergies:  Allergies  Allergen Reactions   Amoxicillin Hives    vomiting   Celecoxib Hives   Claritin [Loratadine] Other (See Comments)    Pt's daughter notified us of this allergy.   Insulins Nausea And Vomiting    Patient unsure of name of insulin, will call us back   Vioxx [Rofecoxib] Hives   Liraglutide Nausea And Vomiting   Propranolol Nausea And Vomiting    diarrhea diarrhea diarrhea     Current Medications: Current Outpatient Medications  Medication Sig Dispense Refill   aspirin EC 81 MG tablet Take 81 mg by mouth daily.     atenolol (TENORMIN) 50 MG tablet Take 75 mg by mouth 2 (two) times daily.      busPIRone (BUSPAR) 7.5 MG tablet Take 1 tablet by mouth in the morning, at noon, and at bedtime.     dexamethasone (DECADRON) 4 MG tablet Take 2 tabs by mouth 2 times daily starting day before chemo. Then take 2 tabs daily for 2 days starting day after chemo. Take with food. 30 tablet 1   DULoxetine (CYMBALTA) 60 MG capsule Take 120 mg by mouth at bedtime.      famotidine (PEPCID) 20 MG tablet Take 20 mg by mouth 2 (two) times daily.     fenofibrate (TRICOR) 145 MG tablet  Take 145 mg by mouth daily.     fexofenadine (ALLEGRA) 180 MG tablet Take 180 mg by mouth daily.     gabapentin (NEURONTIN) 600 MG tablet Take 600 mg by mouth 3 (three) times daily.     hydrALAZINE (APRESOLINE) 50 MG tablet Take 50 mg by mouth 3 (three) times daily.     lidocaine-prilocaine (EMLA) cream Apply to affected area once 30 g 3   LORazepam (ATIVAN) 0.5 MG tablet Take 1 tablet (0.5 mg total) by mouth at bedtime as needed for sleep. May take one tab prior to imaging studies due to anxiety 30 tablet 0   Melatonin 1 MG CAPS Take by mouth. (Patient not taking: Reported on 04/30/2022)     metoprolol tartrate (LOPRESSOR) 25 MG tablet Take 25 mg by mouth 2 (two) times daily.     nicotine (NICODERM CQ - DOSED IN MG/24 HOURS) 21 mg/24hr patch Place 1 patch (21 mg total) onto the skin daily. 28 patch 0   nitroGLYCERIN (NITROSTAT) 0.4 MG SL tablet Place 0.4 mg under the tongue every 5 (five) minutes as needed. For chest pain     ondansetron (ZOFRAN-ODT) 4 MG disintegrating tablet Take 4 mg by mouth every 6 (six) hours as needed.     oxyCODONE (OXY IR/ROXICODONE) 5 MG immediate release tablet Take 5 mg by mouth every 6 (six) hours as needed.     pantoprazole (PROTONIX) 40 MG tablet Take 40 mg by mouth 2 (two) times daily.     prochlorperazine (COMPAZINE) 10 MG tablet Take 1 tablet (10 mg total) by mouth every 6 (six) hours as needed for nausea or vomiting. 30 tablet 1   tirzepatide (MOUNJARO) 2.5 MG/0.5ML Pen INJECT 2.5 MILLIGRAM EVERY WEEK     TRESIBA  FLEXTOUCH 100 UNIT/ML FlexTouch Pen Inject into the skin.     valsartan-hydrochlorothiazide (DIOVAN-HCT) 320-25 MG tablet TAKE 1 TABLET BY ORAL ROUTE ONCE DAILY FOR HIGH BLOOD PRESSURE     zolpidem (AMBIEN) 10 MG tablet Take 10 mg by mouth at bedtime as needed for sleep.  (Patient not taking: Reported on 04/30/2022)     No current facility-administered medications for this visit.

## 2022-05-26 NOTE — Progress Notes (Signed)
Nutrition Assessment   Reason for Assessment: MST screen for weight loss.    ASSESSMENT: Patient is 71 year old female with Stage IB triple negative breast cancer. She has PMHx that includes DM2, HTN, GERD, CAD, TIA. She is followed by Dr. Hinton Rao.  Met with patient and daughter who report her appetite has improved but she is struggling with taste changes.  She eats 2 meals per day brunch and dinner and snack in-between.  She has been having chronic loose stools and 3 episodes of diarrhea daily for "quite a while."  Usual intake: Brunch: Bagel, or egg sandwich or salad  Snacks: Strawberries, chips, candy corn , will eat some peanut butter and crackers Dinner:  Chicken pie or cube steaks & gravy (meat and 2 veg) daughter usually cooks  Fluids: coffee (powdered creamer),  MT Dew 3-4 per day,, 2 bottles water,  CIB QD(choc and Ensure QD        Labs: 05/18/22  K+ 3.0, Creat 1.3   Anthropometrics:   Height: 61" Weight:  05/26/24  136.3# 04/28/22   140.8# UBW: she had been losing weight to help control blood sugars years ago, been 140-145 several years  BMI: 25.73  INTERVENTION:   Educated on fibers to help with diarrhea Encouraged small frequent feeds and trying to eat 6 small meals Encouraged adding 2 servings banana, applesauce with meal, diluted OJ for K+, switching to diet Gingerale flat over ice for nausea and to reduce caffeine and sugar from regular MT. Dew  Suggested oral nutrition supplement-- told her to get sample of Glucerna and trial before she buys  Gave Nutrition Tip sheet  for  Nutrition during cancer treatment. High calorie snacking, samples of Pedialyte, and Ensure and Glucerna coupons  Contact information provided   MONITORING, EVALUATION, GOAL: weight, PO intake, Nutrition Impact Symptoms, labs Goal is weight maintenance  Next Visit: Remote next month after MD follow up  April Manson, RDN, LDN Registered Dietitian, [place] Part Time  Remote (Usual office hours: Tuesday-Thursday) Cell: [phone]

## 2022-05-27 ENCOUNTER — Encounter: Payer: Self-pay | Admitting: Hematology and Oncology

## 2022-05-28 ENCOUNTER — Ambulatory Visit

## 2022-05-28 ENCOUNTER — Telehealth: Payer: Self-pay | Admitting: Genetic Counselor

## 2022-05-28 ENCOUNTER — Encounter: Payer: Self-pay | Admitting: Oncology

## 2022-05-28 ENCOUNTER — Other Ambulatory Visit: Payer: Self-pay | Admitting: Oncology

## 2022-05-28 LAB — PROTEIN, TOTAL: Total Protein: 6 g/dL — AB (ref 6.3–8.2)

## 2022-05-28 LAB — CORRECTED CALCIUM (CC13): Calcium, Corrected: 9.3

## 2022-05-28 MED ORDER — DEXAMETHASONE 4 MG PO TABS: ORAL_TABLET | ORAL | 1 refills | Status: DC

## 2022-05-28 NOTE — Telephone Encounter (Signed)
Follow-up with [facility] regarding re-review of data.  Lindsay Brooks has family history of known BRCA1 mutation in her daughter (report provided to lab) and a personal history of TNBC.  Lindsay Brooks Common Hereditary Cancers +RNA Panel through [facility] was negative.   Per [facility]--  1) The familial BRCA1, c.4524G>A (p.[id number]*) variant falls within our guaranteed covered region, therefore it would have been detected/reported by our analysis pipeline if present, and we confirmed it was not reported in either the clinical or supplemental report.  2) This sample's data has no gaps, warnings, or flags, for BRCA1.  3) This sample's data passed all of quality control thresholds, including complete coverage of all guaranteed regions. For all regions within our guaranteed reportable range, [facility]'s NGS assay provides an average coverage depth of at least 350x.  4) A sample audit was completed, and no evidence of sample swap at [facility] was found.

## 2022-05-29 ENCOUNTER — Other Ambulatory Visit: Payer: Self-pay | Admitting: Pharmacist

## 2022-05-29 ENCOUNTER — Ambulatory Visit

## 2022-05-29 ENCOUNTER — Other Ambulatory Visit: Payer: Self-pay | Admitting: Oncology

## 2022-05-29 DIAGNOSIS — C50412 Malignant neoplasm of upper-outer quadrant of left female breast: Secondary | ICD-10-CM

## 2022-05-29 MED FILL — Docetaxel Soln for IV Infusion 160 MG/16ML: INTRAVENOUS | Qty: 10 | Status: AC

## 2022-05-29 MED FILL — Dexamethasone Sodium Phosphate Inj 100 MG/10ML: INTRAMUSCULAR | Qty: 1 | Status: AC

## 2022-05-29 MED FILL — Cyclophosphamide For Inj 1 GM: INTRAMUSCULAR | Qty: 40 | Status: AC

## 2022-06-01 ENCOUNTER — Inpatient Hospital Stay: Payer: Medicare Other

## 2022-06-01 VITALS — BP 114/58 | HR 118 | Temp 98.1°F | Resp 22 | Ht 61.0 in | Wt 134.0 lb

## 2022-06-01 DIAGNOSIS — Z5189 Encounter for other specified aftercare: Secondary | ICD-10-CM | POA: Diagnosis not present

## 2022-06-01 DIAGNOSIS — D72829 Elevated white blood cell count, unspecified: Secondary | ICD-10-CM | POA: Diagnosis not present

## 2022-06-01 DIAGNOSIS — Z5111 Encounter for antineoplastic chemotherapy: Secondary | ICD-10-CM | POA: Diagnosis not present

## 2022-06-01 DIAGNOSIS — Z803 Family history of malignant neoplasm of breast: Secondary | ICD-10-CM | POA: Diagnosis not present

## 2022-06-01 DIAGNOSIS — C50412 Malignant neoplasm of upper-outer quadrant of left female breast: Secondary | ICD-10-CM | POA: Diagnosis not present

## 2022-06-01 DIAGNOSIS — H6693 Otitis media, unspecified, bilateral: Secondary | ICD-10-CM | POA: Diagnosis not present

## 2022-06-01 DIAGNOSIS — E876 Hypokalemia: Secondary | ICD-10-CM | POA: Diagnosis not present

## 2022-06-01 DIAGNOSIS — E860 Dehydration: Secondary | ICD-10-CM | POA: Diagnosis not present

## 2022-06-01 DIAGNOSIS — Z23 Encounter for immunization: Secondary | ICD-10-CM | POA: Diagnosis not present

## 2022-06-01 DIAGNOSIS — Z171 Estrogen receptor negative status [ER-]: Secondary | ICD-10-CM | POA: Diagnosis not present

## 2022-06-01 DIAGNOSIS — K7460 Unspecified cirrhosis of liver: Secondary | ICD-10-CM | POA: Diagnosis not present

## 2022-06-01 DIAGNOSIS — G629 Polyneuropathy, unspecified: Secondary | ICD-10-CM | POA: Diagnosis not present

## 2022-06-01 DIAGNOSIS — R918 Other nonspecific abnormal finding of lung field: Secondary | ICD-10-CM | POA: Diagnosis not present

## 2022-06-01 DIAGNOSIS — H9190 Unspecified hearing loss, unspecified ear: Secondary | ICD-10-CM | POA: Diagnosis not present

## 2022-06-01 DIAGNOSIS — R569 Unspecified convulsions: Secondary | ICD-10-CM | POA: Diagnosis not present

## 2022-06-01 MED ORDER — HEPARIN SOD (PORK) LOCK FLUSH 100 UNIT/ML IV SOLN
500.0000 [IU] | Freq: Once | INTRAVENOUS | Status: AC | PRN
  Administered 2022-06-01: 500 [IU]

## 2022-06-01 MED ORDER — SODIUM CHLORIDE 0.9% FLUSH
10.0000 mL | INTRAVENOUS | Status: DC | PRN
  Administered 2022-06-01: 10 mL

## 2022-06-01 MED ORDER — PALONOSETRON HCL INJECTION 0.25 MG/5ML
0.2500 mg | Freq: Once | INTRAVENOUS | Status: AC
  Administered 2022-06-01: 0.25 mg via INTRAVENOUS
  Filled 2022-06-01: qty 5

## 2022-06-01 MED ORDER — SODIUM CHLORIDE 0.9 % IV SOLN
10.0000 mg | Freq: Once | INTRAVENOUS | Status: AC
  Administered 2022-06-01: 10 mg via INTRAVENOUS
  Filled 2022-06-01: qty 10

## 2022-06-01 MED ORDER — SODIUM CHLORIDE 0.9 % IV SOLN: Freq: Once | INTRAVENOUS | Status: AC

## 2022-06-01 MED ORDER — SODIUM CHLORIDE 0.9 % IV SOLN
60.0000 mg/m2 | Freq: Once | INTRAVENOUS | Status: AC
  Administered 2022-06-01: 100 mg via INTRAVENOUS
  Filled 2022-06-01: qty 10

## 2022-06-01 MED ORDER — SODIUM CHLORIDE 0.9 % IV SOLN
480.0000 mg/m2 | Freq: Once | INTRAVENOUS | Status: AC
  Administered 2022-06-01: 800 mg via INTRAVENOUS
  Filled 2022-06-01: qty 40

## 2022-06-01 NOTE — Patient Instructions (Signed)
Cyclophosphamide Injection What is this medication? CYCLOPHOSPHAMIDE (sye kloe FOSS fa mide) treats some types of cancer. It works by slowing down the growth of cancer cells. This medicine may be used for other purposes; ask your health care provider or pharmacist if you have questions. COMMON BRAND NAME(S): Cyclophosphamide, Cytoxan, Neosar What should I tell my care team before I take this medication? They need to know if you have any of these conditions: Heart disease Irregular heartbeat or rhythm Infection Kidney problems Liver disease Low blood cell levels (white cells, platelets, or red blood cells) Lung disease Previous radiation Trouble passing urine An unusual or allergic reaction to cyclophosphamide, other medications, foods, dyes, or preservatives Pregnant or trying to get pregnant Breast-feeding How should I use this medication? This medication is injected into a vein. It is given by your care team in a hospital or clinic setting. Talk to your care team about the use of this medication in children. Special care may be needed. Overdosage: If you think you have taken too much of this medicine contact a poison control center or emergency room at once. NOTE: This medicine is only for you. Do not share this medicine with others. What if I miss a dose? Keep appointments for follow-up doses. It is important not to miss your dose. Call your care team if you are unable to keep an appointment. What may interact with this medication? Amphotericin B Amiodarone Azathioprine Certain antivirals for HIV or hepatitis Certain medications for blood pressure, such as enalapril, lisinopril, quinapril Cyclosporine Diuretics Etanercept Indomethacin Medications that relax muscles Metronidazole Natalizumab Tamoxifen Warfarin This list may not describe all possible interactions. Give your health care provider a list of all the medicines, herbs, non-prescription drugs, or dietary  supplements you use. Also tell them if you smoke, drink alcohol, or use illegal drugs. Some items may interact with your medicine. What should I watch for while using this medication? This medication may make you feel generally unwell. This is not uncommon as chemotherapy can affect healthy cells as well as cancer cells. Report any side effects. Continue your course of treatment even though you feel ill unless your care team tells you to stop. You may need blood work while you are taking this medication. This medication may increase your risk of getting an infection. Call your care team for advice if you get a fever, chills, sore throat, or other symptoms of a cold or flu. Do not treat yourself. Try to avoid being around people who are sick. Avoid taking medications that contain aspirin, acetaminophen, ibuprofen, naproxen, or ketoprofen unless instructed by your care team. These medications may hide a fever. Be careful brushing or flossing your teeth or using a toothpick because you may get an infection or bleed more easily. If you have any dental work done, tell your dentist you are receiving this medication. Drink water or other fluids as directed. Urinate often, even at night. Some products may contain alcohol. Ask your care team if this medication contains alcohol. Be sure to tell all care teams you are taking this medicine. Certain medicines, like metronidazole and disulfiram, can cause an unpleasant reaction when taken with alcohol. The reaction includes flushing, headache, nausea, vomiting, sweating, and increased thirst. The reaction can last from 30 minutes to several hours. Talk to your care team if you wish to become pregnant or think you might be pregnant. This medication can cause serious birth defects if taken during pregnancy and for 1 year after the last dose. A  negative pregnancy test is required before starting this medication. A reliable form of contraception is recommended while taking  this medication and for 1 year after the last dose. Talk to your care team about reliable forms of contraception. Do not father a child while taking this medication and for 4 months after the last dose. Use a condom during this time period. Do not breast-feed while taking this medication or for 1 week after the last dose. This medication may cause infertility. Talk to your care team if you are concerned about your fertility. Talk to your care team about your risk of cancer. You may be more at risk for certain types of cancer if you take this medication. What side effects may I notice from receiving this medication? Side effects that you should report to your care team as soon as possible: Allergic reactions--skin rash, itching, hives, swelling of the face, lips, tongue, or throat Dry cough, shortness of breath or trouble breathing Heart failure--shortness of breath, swelling of the ankles, feet, or hands, sudden weight gain, unusual weakness or fatigue Heart muscle inflammation--unusual weakness or fatigue, shortness of breath, chest pain, fast or irregular heartbeat, dizziness, swelling of the ankles, feet, or hands Heart rhythm changes--fast or irregular heartbeat, dizziness, feeling faint or lightheaded, chest pain, trouble breathing Infection--fever, chills, cough, sore throat, wounds that don't heal, pain or trouble when passing urine, general feeling of discomfort or being unwell Kidney injury--decrease in the amount of urine, swelling of the ankles, hands, or feet Liver injury--right upper belly pain, loss of appetite, nausea, light-colored stool, dark yellow or brown urine, yellowing skin or eyes, unusual weakness or fatigue Low red blood cell level--unusual weakness or fatigue, dizziness, headache, trouble breathing Low sodium level--muscle weakness, fatigue, dizziness, headache, confusion Red or dark brown urine Unusual bruising or bleeding Side effects that usually do not require medical  attention (report to your care team if they continue or are bothersome): Hair loss Irregular menstrual cycles or spotting Loss of appetite Nausea Pain, redness, or swelling with sores inside the mouth or throat Vomiting This list may not describe all possible side effects. Call your doctor for medical advice about side effects. You may report side effects to FDA at 1-800-FDA-1088. Where should I keep my medication? This medication is given in a hospital or clinic. It will not be stored at home. NOTE: This sheet is a summary. It may not cover all possible information. If you have questions about this medicine, talk to your doctor, pharmacist, or health care provider.  2023 Elsevier/Gold Standard (2021-11-05 00:00:00) Docetaxel Injection What is this medication? DOCETAXEL (doe se TAX el) treats some types of cancer. It works by slowing down the growth of cancer cells. This medicine may be used for other purposes; ask your health care provider or pharmacist if you have questions. COMMON BRAND NAME(S): Docefrez, Taxotere What should I tell my care team before I take this medication? They need to know if you have any of these conditions: Kidney disease Liver disease Low white blood cell levels Tingling of the fingers or toes or other nerve disorder An unusual or allergic reaction to docetaxel, polysorbate 80, other medications, foods, dyes, or preservatives Pregnant or trying to get pregnant Breast-feeding How should I use this medication? This medication is injected into a vein. It is given by your care team in a hospital or clinic setting. Talk to your care team about the use of this medication in children. Special care may be needed. Overdosage: If you  think you have taken too much of this medicine contact a poison control center or emergency room at once. NOTE: This medicine is only for you. Do not share this medicine with others. What if I miss a dose? Keep appointments for follow-up  doses. It is important not to miss your dose. Call your care team if you are unable to keep an appointment. What may interact with this medication? Do not take this medication with any of the following: Live virus vaccines This medication may also interact with the following: Certain antibiotics, such as clarithromycin, telithromycin Certain antivirals for HIV or hepatitis Certain medications for fungal infections, such as itraconazole, ketoconazole, voriconazole Grapefruit juice Nefazodone Supplements, such as St. John's wort This list may not describe all possible interactions. Give your health care provider a list of all the medicines, herbs, non-prescription drugs, or dietary supplements you use. Also tell them if you smoke, drink alcohol, or use illegal drugs. Some items may interact with your medicine. What should I watch for while using this medication? This medication may make you feel generally unwell. This is not uncommon as chemotherapy can affect healthy cells as well as cancer cells. Report any side effects. Continue your course of treatment even though you feel ill unless your care team tells you to stop. You may need blood work done while you are taking this medication. This medication can cause serious side effects and infusion reactions. To reduce the risk, your care team may give you other medications to take before receiving this one. Be sure to follow the directions from your care team. This medication may increase your risk of getting an infection. Call your care team for advice if you get a fever, chills, sore throat, or other symptoms of a cold or flu. Do not treat yourself. Try to avoid being around people who are sick. Avoid taking medications that contain aspirin, acetaminophen, ibuprofen, naproxen, or ketoprofen unless instructed by your care team. These medications may hide a fever. Be careful brushing or flossing your teeth or using a toothpick because you may get an  infection or bleed more easily. If you have any dental work done, tell your dentist you are receiving this medication. Some products may contain alcohol. Ask your care team if this medication contains alcohol. Be sure to tell all care teams you are taking this medicine. Certain medications, like metronidazole and disulfiram, can cause an unpleasant reaction when taken with alcohol. The reaction includes flushing, headache, nausea, vomiting, sweating, and increased thirst. The reaction can last from 30 minutes to several hours. This medication may affect your coordination, reaction time, or judgement. Do not drive or operate machinery until you know how this medication affects you. Sit up or stand slowly to reduce the risk of dizzy or fainting spells. Drinking alcohol with this medication can increase the risk of these side effects. Talk to your care team about your risk of cancer. You may be more at risk for certain types of cancer if you take this medication. Talk to your care team if you wish to become pregnant or think you might be pregnant. This medication can cause serious birth defects if taken during pregnancy or if you get pregnant within 2 months after stopping therapy. A negative pregnancy test is required before starting this medication. A reliable form of contraception is recommended while taking this medication and for 2 months after stopping it. Talk to your care team about reliable forms of contraception. Do not breast-feed while taking this  medication and for 1 week after stopping therapy. Use a condom during sex and for 4 months after stopping therapy. Tell your care team right away if you think your partner might be pregnant. This medication can cause serious birth defects. This medication may cause infertility. Talk to your care team if you are concerned about your fertility. What side effects may I notice from receiving this medication? Side effects that you should report to your care  team as soon as possible: Allergic reactions--skin rash, itching, hives, swelling of the face, lips, tongue, or throat Change in vision such as blurry vision, seeing halos around lights, vision loss Infection--fever, chills, cough, or sore throat Infusion reactions--chest pain, shortness of breath or trouble breathing, feeling faint or lightheaded Low red blood cell level--unusual weakness or fatigue, dizziness, headache, trouble breathing Pain, tingling, or numbness in the hands or feet Painful swelling, warmth, or redness of the skin, blisters or sores at the infusion site Redness, blistering, peeling, or loosening of the skin, including inside the mouth Sudden or severe stomach pain, bloody diarrhea, fever, nausea, vomiting Swelling of the ankles, hands, or feet Tumor lysis syndrome (TLS)--nausea, vomiting, diarrhea, decrease in the amount of urine, dark urine, unusual weakness or fatigue, confusion, muscle pain or cramps, fast or irregular heartbeat, joint pain Unusual bruising or bleeding Side effects that usually do not require medical attention (report to your care team if they continue or are bothersome): Change in nail shape, thickness, or color Change in taste Hair loss Increased tears This list may not describe all possible side effects. Call your doctor for medical advice about side effects. You may report side effects to FDA at 1-800-FDA-1088. Where should I keep my medication? This medication is given in a hospital or clinic. It will not be stored at home. NOTE: This sheet is a summary. It may not cover all possible information. If you have questions about this medicine, talk to your doctor, pharmacist, or health care provider.  2023 [employer]/Gold Standard (2021-09-26 00:00:00)

## 2022-06-03 ENCOUNTER — Inpatient Hospital Stay: Payer: Medicare Other

## 2022-06-03 VITALS — BP 110/68 | HR 112 | Temp 97.7°F | Resp 20 | Wt 131.1 lb

## 2022-06-03 DIAGNOSIS — H9190 Unspecified hearing loss, unspecified ear: Secondary | ICD-10-CM | POA: Diagnosis not present

## 2022-06-03 DIAGNOSIS — Z171 Estrogen receptor negative status [ER-]: Secondary | ICD-10-CM

## 2022-06-03 DIAGNOSIS — E876 Hypokalemia: Secondary | ICD-10-CM | POA: Diagnosis not present

## 2022-06-03 DIAGNOSIS — Z5111 Encounter for antineoplastic chemotherapy: Secondary | ICD-10-CM | POA: Diagnosis not present

## 2022-06-03 DIAGNOSIS — H6693 Otitis media, unspecified, bilateral: Secondary | ICD-10-CM | POA: Diagnosis not present

## 2022-06-03 DIAGNOSIS — Z23 Encounter for immunization: Secondary | ICD-10-CM | POA: Diagnosis not present

## 2022-06-03 DIAGNOSIS — E860 Dehydration: Secondary | ICD-10-CM | POA: Diagnosis not present

## 2022-06-03 DIAGNOSIS — R569 Unspecified convulsions: Secondary | ICD-10-CM | POA: Diagnosis not present

## 2022-06-03 DIAGNOSIS — K7460 Unspecified cirrhosis of liver: Secondary | ICD-10-CM | POA: Diagnosis not present

## 2022-06-03 DIAGNOSIS — C50412 Malignant neoplasm of upper-outer quadrant of left female breast: Secondary | ICD-10-CM | POA: Diagnosis not present

## 2022-06-03 DIAGNOSIS — Z803 Family history of malignant neoplasm of breast: Secondary | ICD-10-CM | POA: Diagnosis not present

## 2022-06-03 DIAGNOSIS — Z5189 Encounter for other specified aftercare: Secondary | ICD-10-CM | POA: Diagnosis not present

## 2022-06-03 DIAGNOSIS — R918 Other nonspecific abnormal finding of lung field: Secondary | ICD-10-CM | POA: Diagnosis not present

## 2022-06-03 DIAGNOSIS — G629 Polyneuropathy, unspecified: Secondary | ICD-10-CM | POA: Diagnosis not present

## 2022-06-03 DIAGNOSIS — D72829 Elevated white blood cell count, unspecified: Secondary | ICD-10-CM | POA: Diagnosis not present

## 2022-06-03 MED ORDER — PEGFILGRASTIM-CBQV 6 MG/0.6ML ~~LOC~~ SOSY
6.0000 mg | PREFILLED_SYRINGE | Freq: Once | SUBCUTANEOUS | Status: AC
  Administered 2022-06-03: 6 mg via SUBCUTANEOUS
  Filled 2022-06-03: qty 0.6

## 2022-06-03 NOTE — Patient Instructions (Signed)

## 2022-06-08 ENCOUNTER — Encounter: Payer: Self-pay | Admitting: Oncology

## 2022-06-10 DIAGNOSIS — E1169 Type 2 diabetes mellitus with other specified complication: Secondary | ICD-10-CM | POA: Diagnosis not present

## 2022-06-10 DIAGNOSIS — I10 Essential (primary) hypertension: Secondary | ICD-10-CM | POA: Diagnosis not present

## 2022-06-16 NOTE — Progress Notes (Deleted)
[facility]  [address] [place],  [place]  [address] [phone]  Clinic Day: 06/18/2022  Referring physician: Marco Collie, MD   ASSESSMENT & PLAN:  Stage IA Left breast cancer This is a grade 3 triple negative 21 mm tumor for a T1cN0M0 with a Ki-67 of 40%. She has had resection with clear margins and negative sentinel node. However, I do recommend adjuvant chemotherapy and she is agreeable. Given her age and co-morbidities, she is on docetaxol and cytoxan every 3 weeks for 4 cycles to avoid anthracyclines.    Hypokalemia Her potassium has dropped from 3.4 to 3.0 so I will give IV supplement of 40 meq and start her on 20 meq oral daily.  Leukocytosis This is from her Neulasta  Her white count is 33,500.  Neuropathy She complains of numbness and pain of both legs that she rates as a 9/10. She also complains of back pain, which could be related to the Neulasta.  Family history of breast cancer and BRCA positivity Yet she is BRCA negative.  Lung nodule This is a ground glass nodule of the right upper lobe which has shown indolent growth from 1.8 cm in April of 2019 to 2.2 cm currently. We will plan to monitor this with repeat imaging periodically.  Liver cirrhosis Liver is decreased in attenuation diffusely with mildly irregular margins. I don't think she was aware of this diagnosis but it may explain her increased toxicities.  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  Memory impairment This is consistent with early dementia. She also has significant hearing impairment.   Plan: We will see her back in 3 weeks with repeat CBC and CMP for her fourth and final cycle. I discussed the assessment and treatment plan with the patient and her family.  The patient was provided an opportunity to ask questions and all were answered.  The patient agreed  with the plan and demonstrated an understanding of the instructions.  The patient was advised to call back if the symptoms worsen or if the condition fails to improve as anticipated.   I provided 20 minutes of face-to-face time during this this encounter and > 50% was spent counseling as documented under my assessment and plan.   Derwood Kaplan, MD [facility] [address] [place] [address] Dept: [phone] Dept Fax: [phone]     CHIEF COMPLAINT:  CC: Invasive ductal carcinoma  Current Treatment:  chemotherapy   HISTORY OF PRESENT ILLNESS:   Oncology History  Breast cancer of upper-outer quadrant of left female breast ([facility])  03/18/2022 Initial Diagnosis   Breast cancer of upper-outer quadrant of left female breast ([place])   04/28/2022 Cancer Staging   Staging form: Breast, AJCC 8th Edition - Clinical stage from 04/28/2022: Stage IB (cT1c, cN0(sn), cM0, G3, ER-, PR-, HER2-) - Signed by Derwood Kaplan, MD on 04/28/2022 Histopathologic type: Infiltrating duct carcinoma, NOS Stage prefix: Initial diagnosis Method of lymph node assessment: Sentinel lymph node biopsy Nuclear grade: G3 Multigene prognostic tests performed: None Histologic grading system: 3 grade system Laterality: Left Tumor size (mm): 20 Lymph-vascular invasion (LVI): LVI not present (absent)/not identified Diagnostic confirmation: Positive histology Specimen type: Excision Staged by: Managing physician Menopausal status: Postmenopausal Ki-67 (%): 40 Stage used in treatment planning: Yes National guidelines used in treatment planning: Yes Type of national guideline used in treatment  planning: NCCN   05/07/2022 -  Chemotherapy   Patient is on Treatment Plan : BREAST TC q21d     05/19/2022 Genetic Testing   Negative hereditary cancer genetic testing: no pathogenic variant detected in [facility] Common Hereditary Cancers +RNA Panel.   Report date is May 19, 2022.    The Common Hereditary Cancers + RNA Panel offered by [facility] includes sequencing, deletion/duplication, and RNA testing of the following 47 genes: APC, ATM, AXIN2, BARD1, BMPR1A, BRCA1, BRCA2, BRIP1, CDH1, CDK4*, CDKN2A (p14ARF)*, CDKN2A (p16INK4a)*, CHEK2, CTNNA1, DICER1, EPCAM (Deletion/duplication testing only), GREM1 (promoter region deletion/duplication testing only), KIT, MEN1, MLH1, MSH2, MSH3, MSH6, MUTYH, NBN, NF1, NHTL1, PALB2, PDGFRA*, PMS2, POLD1, POLE, PTEN, RAD50, RAD51C, RAD51D, SDHB, SDHC, SDHD, SMAD4, SMARCA4. STK11, TP53, TSC1, TSC2, and VHL.  The following genes were evaluated for sequence changes only: SDHA and HOXB13 c.251G>A variant only.  RNA analysis is not performed for the * genes.     Lindsay Brooks is a 71 year old woman who had a screening mammogram on 01/27/2022 and was found to have a possible left breast mass. She does get yearly mammograms due to her strong family history. She thought  she could feel a lump after this was found. She had a diagnostic mammogram on 02/11/2022 with a persistent spiculated hyperdense mass with associated calcifications in the upper inner quadrant. Ultrasound confirmed an irregular hypoechoic mass with peripheral vascularity at 11:30, 3 cm from the nipple. This measures 2.2 cm and the left axilla was negative. She was referred to [name] after a biopsy was done on 02/17/2022, which confirmed a grade 3 invasive ductal carcinoma which is triple negative with a Ki-67 of 40%. [name] took her to surgery on 03/18/2022 for lumpectomy and sentinel lymph node. The final pathology revealed a 20 mm grade 3 invasive ductal carcinoma with DCIS. The margins were clear and the lymph node was negative for a T1cN0M0, stage IA. She has had a port placed.   INTERVAL HISTORY:  Lindsay Brooks is seen here today for recently diagnosed invasive ductal carcinoma, after her second cycle of TC chemotherapy. She did much better with the second cycle.  She will receive her third cycle on Monday. She states her knees have been weak describes it as buckling so she has been having to hold on to things to keep stable. She states her left knee hurts the most.  Appetite is not good, nothing tastes right. Her weight today has increased 6 pounds since her last visit. She denies fever, chills, night sweats, or other signs of infection. She denies cardiorespiratory and gastrointestinal issues. She  denies any other pain.    SOCIAL HISTORY She lives with her first daughter, who is present at today's visit. She has  2 daughters. She smokes 1.5 packs a day. Has been smoking since 71 yo. She is not sure about quitting. She did smoke 2 packs per day for over 50 years. She doesn't drink other than rare occasion She is widowed She has been married 4 times She is disabled  Menstrual History 71 years old when she had her first child She has had 3 children 39 years old when starting menstrual period Menopause started at 89 She has had a partial hysterectomy in her 38's and went back for complete hysterectomy/BSO later. No hormone replacement therapy  FAMILY HISTORY Daughter had breast cancer at age 40 Aunt had breast cancer at age 64 Her sister had a brain tumor at age 58   SURGICAL HISTORY She had a partial  Hysterectomy in her late 30's, and went back a few years later to have BSO. Tonsillectomy cataract surgery  Stents of the iliac and femoral arteries. Nerve repair of the left upper extremity  PAST MEDICAL HISTORY Osteoporosis Coronary artery disease COPD Diabetes  Migraine headaches Hyperlipidemia Hypertension TIA Anxiety GERD Peripheral vascular disease Hard of hearing    REVIEW OF SYSTEMS:  Review of Systems  Constitutional: Negative.   HENT:  Negative.    Eyes: Negative.   Respiratory: Negative.    Gastrointestinal: Negative.   Endocrine: Negative.   Musculoskeletal: Negative.   Skin: Negative.   Neurological: Negative.         There is a question of possible early dementia since she has memory impairment  Hematological: Negative.   Psychiatric/Behavioral: Negative.      VITALS:  Blood pressure (!) 112/55, pulse (!) 119, temperature 97.8 F (36.6 C), temperature source Oral, resp. rate 20, height _0  (1.549 m), weight 137 lb 1.6 oz (62.2 kg), SpO2 96 %.  Wt Readings from Last 3 Encounters:  06/24/22 140 lb (63.5 kg)  06/22/22 132 lb 1.3 oz (59.9 kg)  06/18/22 137 lb 1.6 oz (62.2 kg)    Body mass index is 25.9 kg/m.  Performance status (ECOG): 0 - Asymptomatic  PHYSICAL EXAM:  Physical Exam Constitutional:      General: She is not in acute distress.    Appearance: Normal appearance. She is normal weight. She is not ill-appearing or toxic-appearing.  HENT:     Head: Normocephalic and atraumatic.     Ears:     Comments: Bilateral otitis media with a small amount of blood in the left ear canal    Nose: Nose normal.     Mouth/Throat:     Mouth: Mucous membranes are dry.  Eyes:     General: No scleral icterus.    Extraocular Movements: Extraocular movements intact.     Conjunctiva/sclera: Conjunctivae normal.     Pupils: Pupils are equal, round, and reactive to light.  Cardiovascular:     Rate and Rhythm: Normal rate and regular rhythm.     Pulses: Normal pulses.     Heart sounds: Normal heart sounds. No murmur heard.    No friction rub. No gallop.  Pulmonary:     Effort: Pulmonary effort is normal. No respiratory distress.     Breath sounds: Normal breath sounds. No wheezing or rales.  Chest:     Chest wall: No mass.  Breasts:    Right: Normal. No mass.     Left: No mass.     Comments: She has several scars around the nipple areolar complex from the dye injections. She has a healing scar in the superior left breast. Healing left scar of the left axilla. No masses in either breast. Abdominal:     General: Bowel sounds are normal. There is no distension.     Palpations: Abdomen is soft.  There is no hepatomegaly, splenomegaly or mass.     Tenderness: There is no abdominal tenderness.  Musculoskeletal:        General: Normal range of motion.     Cervical back: Normal range of motion and neck supple.     Right lower leg: No edema.     Left lower leg: No edema.  Lymphadenopathy:     Cervical: No cervical adenopathy.  Skin:    General: Skin is warm and dry.  Neurological:     General: No focal deficit present.  Mental Status: She is alert and oriented to person, place, and time. Mental status is at baseline.  Psychiatric:        Mood and Affect: Mood normal.        Behavior: Behavior normal.        Thought Content: Thought content normal.        Judgment: Judgment normal.    LABS:      Latest Ref Rng & Units 06/18/2022   12:00 AM 05/26/2022   12:00 AM 05/18/2022   12:00 AM  CBC  WBC  11.5     19.1     33.5      Hemoglobin 12.0 - 16.0 12.5     12.2     14.9      Hematocrit 36 - 46 38     37     45      Platelets 150 - 400 K/uL 234     238     247         This result is from an external source.      Latest Ref Rng & Units 06/18/2022   12:00 AM 05/26/2022   12:00 AM 05/18/2022   12:00 AM  CMP  BUN 4 - _0 37      Creatinine 0.5 - 1.1 0.8     1.0     1.3      Sodium 137 - 147 133     136     138      Potassium 3.5 - 5.1 mEq/L 3.3     3.6     3.0      Chloride 99 - 108 98     104     103      CO2 13 - _1 Calcium 8.7 - 10.7 9.0     8.6     9.0      Total Protein 6.3 - 8.2 g/dL  6       Alkaline Phos 25 - 125 105     71     120      AST 13 - 35 _2 ALT 7 - 35 U/L _3 This result is from an external source.     No results found for: "CEA1", "CEA" / No results found for: "CEA1", "CEA" No results found for: "PSA1" No results found for: "WCH364" No results found for: "CAN125"  No results found for: "TOTALPROTELP", "ALBUMINELP", "A1GS", "A2GS", "BETS", "BETA2SER", "GAMS", "MSPIKE",  "SPEI" No results found for: "TIBC", "FERRITIN", "IRONPCTSAT" No results found for: "LDH"   STUDIES:  EXAM:05/05/22 NUCLEAR MEDICINE WHOLE BODY BONE SCAN IMPRESSION: Uptake at LEFT tibial metaphysis especially medially, nonspecific; radiographic correlation recommended to exclude metastatic focus.  Focal abnormal tracer uptake at the LEFT temporo-occipital calvarium, corresponding to and focus of abnormal marrow signal on prior MR head exams back to 2010, nonspecific but likely benign.  No additional scintigraphic findings to suggest osseous metastatic disease.  Diffuse calvarial uptake, favoring metabolic bone disease.   EXAM:05/05/22 CT CHEST WITH CONTRAST IMPRESSION: Ground glass nodule in the right upper lobe with a possible developing internal solid component. Lesion has shown indolent  growth from 11/11/2017, at which time it measured approcimately 1.2 x 1.8 cm. Findings are worrisome for adenocarcinoma.  No definitive evidence of metastatic breast cancer. Steatotic cirrhotic liver  EXAM:03/18/2022 REPORT SURGICAL PATHOLOGY DIAGNOSIS Breast, lumpectomy, Left UOQ Invasive ductal carcinoma, grade 3, and ductal carcinoma in situ Ribbon clip and biopsy site present All margins negative for invasive carcinoma and DCIS Fibrocystic changes Lymph node, sentinel, biopsy One lymph node negative for tumor (0/1)  Estrogen receptor: 0% Progesterone receptor: 0% HER2 negative at 1+ Proliferation marker Ki-67: 40%  Bone density scan 01/27/2022 1.  Osteoporosis of the spine with a T score of -2.5, worse 2.  Left femur - osteoporosis with a T score of -2.7, slightly better 3.  Dual femur total mean-osteoporosis with a T score of -2.5, worse   Allergies:  Allergies  Allergen Reactions   Amoxicillin Hives    vomiting   Celecoxib Hives   Claritin [Loratadine] Other (See Comments)    Pt's daughter notified us of this allergy.   Insulins Nausea And Vomiting    Patient unsure of name  of insulin, will call us back   Vioxx [Rofecoxib] Hives   Liraglutide Nausea And Vomiting   Propranolol Nausea And Vomiting    diarrhea diarrhea diarrhea     Current Medications: Current Outpatient Medications  Medication Sig Dispense Refill   aspirin EC 81 MG tablet Take 81 mg by mouth daily.     atenolol (TENORMIN) 50 MG tablet Take 75 mg by mouth 2 (two) times daily.      busPIRone (BUSPAR) 7.5 MG tablet Take 1 tablet by mouth in the morning, at noon, and at bedtime.     dexamethasone (DECADRON) 4 MG tablet Take 2 tabs by mouth 2 times daily starting day before chemo. Then take 2 tabs daily for 2 days starting day after chemo. Take with food. 30 tablet 1   DULoxetine (CYMBALTA) 60 MG capsule Take 120 mg by mouth at bedtime.      famotidine (PEPCID) 20 MG tablet Take 20 mg by mouth 2 (two) times daily.     fenofibrate (TRICOR) 145 MG tablet Take 145 mg by mouth daily.     fexofenadine (ALLEGRA) 180 MG tablet Take 180 mg by mouth daily.     gabapentin (NEURONTIN) 600 MG tablet Take 600 mg by mouth 3 (three) times daily.     hydrALAZINE (APRESOLINE) 50 MG tablet Take 50 mg by mouth 3 (three) times daily.     lidocaine-prilocaine (EMLA) cream Apply to affected area once 30 g 3   LORazepam (ATIVAN) 0.5 MG tablet Take 1 tablet (0.5 mg total) by mouth at bedtime as needed for sleep. May take one tab prior to imaging studies due to anxiety 30 tablet 0   Melatonin 1 MG CAPS Take by mouth. (Patient not taking: Reported on 04/30/2022)     metoprolol tartrate (LOPRESSOR) 25 MG tablet Take 25 mg by mouth 2 (two) times daily.     nicotine (NICODERM CQ - DOSED IN MG/24 HOURS) 21 mg/24hr patch Place 1 patch (21 mg total) onto the skin daily. 28 patch 0   nitroGLYCERIN (NITROSTAT) 0.4 MG SL tablet Place 0.4 mg under the tongue every 5 (five) minutes as needed. For chest pain     ondansetron (ZOFRAN-ODT) 4 MG disintegrating tablet Take 4 mg by mouth every 6 (six) hours as needed.     oxyCODONE (OXY  IR/ROXICODONE) 5 MG immediate release tablet Take 5 mg by mouth every 6 (six) hours  as needed.     pantoprazole (PROTONIX) 40 MG tablet Take 40 mg by mouth 2 (two) times daily.     potassium chloride (KLOR-CON M) 10 MEQ tablet Take 1 tablet (10 mEq total) by mouth daily. 30 tablet 5   prochlorperazine (COMPAZINE) 10 MG tablet Take 1 tablet (10 mg total) by mouth every 6 (six) hours as needed for nausea or vomiting. 30 tablet 1   tirzepatide (MOUNJARO) 2.5 MG/0.5ML Pen INJECT 2.5 MILLIGRAM EVERY WEEK     TRESIBA FLEXTOUCH 100 UNIT/ML FlexTouch Pen Inject into the skin.     valsartan-hydrochlorothiazide (DIOVAN-HCT) 320-25 MG tablet TAKE 1 TABLET BY ORAL ROUTE ONCE DAILY FOR HIGH BLOOD PRESSURE     zolpidem (AMBIEN) 10 MG tablet Take 10 mg by mouth at bedtime as needed for sleep.  (Patient not taking: Reported on 04/30/2022)     No current facility-administered medications for this visit.    I,Gabriella Ballesteros,acting as a scribe for Derwood Kaplan, MD.,have documented all relevant documentation on the behalf of Derwood Kaplan, MD,as directed by  Derwood Kaplan, MD while in the presence of Derwood Kaplan, MD.

## 2022-06-17 ENCOUNTER — Other Ambulatory Visit

## 2022-06-17 ENCOUNTER — Ambulatory Visit: Admitting: Oncology

## 2022-06-18 ENCOUNTER — Inpatient Hospital Stay: Payer: Medicare Other | Attending: Oncology

## 2022-06-18 ENCOUNTER — Ambulatory Visit

## 2022-06-18 ENCOUNTER — Inpatient Hospital Stay (INDEPENDENT_AMBULATORY_CARE_PROVIDER_SITE_OTHER): Payer: Medicare Other | Admitting: Oncology

## 2022-06-18 ENCOUNTER — Encounter: Payer: Self-pay | Admitting: Oncology

## 2022-06-18 VITALS — BP 112/55 | HR 119 | Temp 97.8°F | Resp 20 | Ht 61.0 in | Wt 137.1 lb

## 2022-06-18 DIAGNOSIS — E785 Hyperlipidemia, unspecified: Secondary | ICD-10-CM | POA: Insufficient documentation

## 2022-06-18 DIAGNOSIS — J449 Chronic obstructive pulmonary disease, unspecified: Secondary | ICD-10-CM | POA: Insufficient documentation

## 2022-06-18 DIAGNOSIS — R569 Unspecified convulsions: Secondary | ICD-10-CM | POA: Insufficient documentation

## 2022-06-18 DIAGNOSIS — F17210 Nicotine dependence, cigarettes, uncomplicated: Secondary | ICD-10-CM | POA: Insufficient documentation

## 2022-06-18 DIAGNOSIS — E876 Hypokalemia: Secondary | ICD-10-CM | POA: Insufficient documentation

## 2022-06-18 DIAGNOSIS — Z794 Long term (current) use of insulin: Secondary | ICD-10-CM | POA: Insufficient documentation

## 2022-06-18 DIAGNOSIS — K7460 Unspecified cirrhosis of liver: Secondary | ICD-10-CM | POA: Insufficient documentation

## 2022-06-18 DIAGNOSIS — Z803 Family history of malignant neoplasm of breast: Secondary | ICD-10-CM | POA: Insufficient documentation

## 2022-06-18 DIAGNOSIS — Z7952 Long term (current) use of systemic steroids: Secondary | ICD-10-CM | POA: Insufficient documentation

## 2022-06-18 DIAGNOSIS — Z171 Estrogen receptor negative status [ER-]: Secondary | ICD-10-CM

## 2022-06-18 DIAGNOSIS — Z7982 Long term (current) use of aspirin: Secondary | ICD-10-CM | POA: Insufficient documentation

## 2022-06-18 DIAGNOSIS — C50412 Malignant neoplasm of upper-outer quadrant of left female breast: Secondary | ICD-10-CM

## 2022-06-18 DIAGNOSIS — E119 Type 2 diabetes mellitus without complications: Secondary | ICD-10-CM | POA: Insufficient documentation

## 2022-06-18 DIAGNOSIS — Z5189 Encounter for other specified aftercare: Secondary | ICD-10-CM | POA: Insufficient documentation

## 2022-06-18 DIAGNOSIS — Z5111 Encounter for antineoplastic chemotherapy: Secondary | ICD-10-CM | POA: Insufficient documentation

## 2022-06-18 DIAGNOSIS — H9190 Unspecified hearing loss, unspecified ear: Secondary | ICD-10-CM | POA: Insufficient documentation

## 2022-06-18 DIAGNOSIS — I2510 Atherosclerotic heart disease of native coronary artery without angina pectoris: Secondary | ICD-10-CM | POA: Insufficient documentation

## 2022-06-18 DIAGNOSIS — Z79899 Other long term (current) drug therapy: Secondary | ICD-10-CM | POA: Insufficient documentation

## 2022-06-18 DIAGNOSIS — R911 Solitary pulmonary nodule: Secondary | ICD-10-CM | POA: Insufficient documentation

## 2022-06-18 DIAGNOSIS — G629 Polyneuropathy, unspecified: Secondary | ICD-10-CM | POA: Insufficient documentation

## 2022-06-18 DIAGNOSIS — D72829 Elevated white blood cell count, unspecified: Secondary | ICD-10-CM | POA: Insufficient documentation

## 2022-06-18 DIAGNOSIS — I10 Essential (primary) hypertension: Secondary | ICD-10-CM | POA: Insufficient documentation

## 2022-06-18 LAB — BASIC METABOLIC PANEL
BUN: 7 (ref 4–21)
CO2: 23 — AB (ref 13–22)
Chloride: 98 — AB (ref 99–108)
Creatinine: 0.8 (ref 0.5–1.1)
Glucose: 275
Potassium: 3.3 mEq/L — AB (ref 3.5–5.1)
Sodium: 133 — AB (ref 137–147)

## 2022-06-18 LAB — HEPATIC FUNCTION PANEL
ALT: 18 U/L (ref 7–35)
AST: 24 (ref 13–35)
Alkaline Phosphatase: 105 (ref 25–125)
Bilirubin, Total: 0.3

## 2022-06-18 LAB — CBC AND DIFFERENTIAL
HCT: 38 (ref 36–46)
Hemoglobin: 12.5 (ref 12.0–16.0)
Neutrophils Absolute: 8.97
Platelets: 234 10*3/uL (ref 150–400)
WBC: 11.5

## 2022-06-18 LAB — CBC: RBC: 4.16 (ref 3.87–5.11)

## 2022-06-18 LAB — COMPREHENSIVE METABOLIC PANEL
Albumin: 3.7 (ref 3.5–5.0)
Calcium: 9 (ref 8.7–10.7)

## 2022-06-18 NOTE — Progress Notes (Signed)
Face to face with pt and boyfriend in [employer]. Pt is here for Med Onc follow up. Pt reports that she she feels pretty weak right after treatments but bounces back. No complaints at present time.

## 2022-06-19 ENCOUNTER — Other Ambulatory Visit: Payer: Self-pay

## 2022-06-19 ENCOUNTER — Ambulatory Visit

## 2022-06-19 ENCOUNTER — Encounter: Payer: Self-pay | Admitting: Oncology

## 2022-06-19 ENCOUNTER — Telehealth: Payer: Self-pay

## 2022-06-19 ENCOUNTER — Other Ambulatory Visit: Payer: Self-pay | Admitting: Oncology

## 2022-06-19 DIAGNOSIS — E876 Hypokalemia: Secondary | ICD-10-CM

## 2022-06-19 MED ORDER — POTASSIUM CHLORIDE CRYS ER 10 MEQ PO TBCR: 10.0000 meq | EXTENDED_RELEASE_TABLET | Freq: Every day | ORAL | 5 refills | Status: DC

## 2022-06-19 MED FILL — Cyclophosphamide For Inj 1 GM: INTRAMUSCULAR | Qty: 40 | Status: AC

## 2022-06-19 MED FILL — Dexamethasone Sodium Phosphate Inj 100 MG/10ML: INTRAMUSCULAR | Qty: 1 | Status: AC

## 2022-06-19 MED FILL — Docetaxel Soln for IV Infusion 160 MG/16ML: INTRAVENOUS | Qty: 10 | Status: AC

## 2022-06-19 NOTE — Telephone Encounter (Signed)
-----   Message from Derwood Kaplan, MD sent at 06/19/2022  9:28 AM EST ----- Regarding: K Chemo Monday and blood counts okay, this is #3. But K keeps dropping, likely due to N/V and diarrhea intermittently.  Can you add KCL 20 meq IV Monday or will it be too long? At least 12mq?  [place] tell her I am ordering K pills, just 1 daily, I better just do 10 meq and not sure she will be compliant but we will try.

## 2022-06-19 NOTE — Telephone Encounter (Signed)
Informed daughter [name] that patient is to start taking a potassium supplement once a day. Dr. Hinton Rao has already sent to pharmacy.

## 2022-06-21 DIAGNOSIS — U071 COVID-19: Secondary | ICD-10-CM | POA: Diagnosis not present

## 2022-06-21 DIAGNOSIS — R0603 Acute respiratory distress: Secondary | ICD-10-CM | POA: Diagnosis not present

## 2022-06-22 ENCOUNTER — Inpatient Hospital Stay: Payer: Medicare Other

## 2022-06-22 VITALS — BP 112/65 | HR 105 | Temp 97.6°F | Resp 20 | Ht 61.0 in | Wt 132.1 lb

## 2022-06-22 DIAGNOSIS — I10 Essential (primary) hypertension: Secondary | ICD-10-CM | POA: Diagnosis not present

## 2022-06-22 DIAGNOSIS — Z794 Long term (current) use of insulin: Secondary | ICD-10-CM | POA: Diagnosis not present

## 2022-06-22 DIAGNOSIS — E876 Hypokalemia: Secondary | ICD-10-CM | POA: Diagnosis not present

## 2022-06-22 DIAGNOSIS — Z7952 Long term (current) use of systemic steroids: Secondary | ICD-10-CM | POA: Diagnosis not present

## 2022-06-22 DIAGNOSIS — E119 Type 2 diabetes mellitus without complications: Secondary | ICD-10-CM | POA: Diagnosis not present

## 2022-06-22 DIAGNOSIS — F17210 Nicotine dependence, cigarettes, uncomplicated: Secondary | ICD-10-CM | POA: Diagnosis not present

## 2022-06-22 DIAGNOSIS — Z803 Family history of malignant neoplasm of breast: Secondary | ICD-10-CM | POA: Diagnosis not present

## 2022-06-22 DIAGNOSIS — Z5111 Encounter for antineoplastic chemotherapy: Secondary | ICD-10-CM | POA: Diagnosis not present

## 2022-06-22 DIAGNOSIS — Z79899 Other long term (current) drug therapy: Secondary | ICD-10-CM | POA: Diagnosis not present

## 2022-06-22 DIAGNOSIS — R911 Solitary pulmonary nodule: Secondary | ICD-10-CM | POA: Diagnosis not present

## 2022-06-22 DIAGNOSIS — C50412 Malignant neoplasm of upper-outer quadrant of left female breast: Secondary | ICD-10-CM | POA: Diagnosis not present

## 2022-06-22 DIAGNOSIS — H9190 Unspecified hearing loss, unspecified ear: Secondary | ICD-10-CM | POA: Diagnosis not present

## 2022-06-22 DIAGNOSIS — E785 Hyperlipidemia, unspecified: Secondary | ICD-10-CM | POA: Diagnosis not present

## 2022-06-22 DIAGNOSIS — Z7982 Long term (current) use of aspirin: Secondary | ICD-10-CM | POA: Diagnosis not present

## 2022-06-22 DIAGNOSIS — K7460 Unspecified cirrhosis of liver: Secondary | ICD-10-CM | POA: Diagnosis not present

## 2022-06-22 DIAGNOSIS — R569 Unspecified convulsions: Secondary | ICD-10-CM | POA: Diagnosis not present

## 2022-06-22 DIAGNOSIS — G629 Polyneuropathy, unspecified: Secondary | ICD-10-CM | POA: Diagnosis not present

## 2022-06-22 DIAGNOSIS — J449 Chronic obstructive pulmonary disease, unspecified: Secondary | ICD-10-CM | POA: Diagnosis not present

## 2022-06-22 DIAGNOSIS — I2510 Atherosclerotic heart disease of native coronary artery without angina pectoris: Secondary | ICD-10-CM | POA: Diagnosis not present

## 2022-06-22 DIAGNOSIS — D72829 Elevated white blood cell count, unspecified: Secondary | ICD-10-CM | POA: Diagnosis not present

## 2022-06-22 DIAGNOSIS — Z5189 Encounter for other specified aftercare: Secondary | ICD-10-CM | POA: Diagnosis not present

## 2022-06-22 MED ORDER — PALONOSETRON HCL INJECTION 0.25 MG/5ML
0.2500 mg | Freq: Once | INTRAVENOUS | Status: AC
  Administered 2022-06-22: 0.25 mg via INTRAVENOUS
  Filled 2022-06-22: qty 5

## 2022-06-22 MED ORDER — SODIUM CHLORIDE 0.9 % IV SOLN
60.0000 mg/m2 | Freq: Once | INTRAVENOUS | Status: AC
  Administered 2022-06-22: 100 mg via INTRAVENOUS
  Filled 2022-06-22: qty 10

## 2022-06-22 MED ORDER — SODIUM CHLORIDE 0.9 % IV SOLN: Freq: Once | INTRAVENOUS | Status: AC

## 2022-06-22 MED ORDER — SODIUM CHLORIDE 0.9 % IV SOLN
480.0000 mg/m2 | Freq: Once | INTRAVENOUS | Status: AC
  Administered 2022-06-22: 800 mg via INTRAVENOUS
  Filled 2022-06-22: qty 30

## 2022-06-22 MED ORDER — HEPARIN SOD (PORK) LOCK FLUSH 100 UNIT/ML IV SOLN
500.0000 [IU] | Freq: Once | INTRAVENOUS | Status: AC | PRN
  Administered 2022-06-22: 500 [IU]

## 2022-06-22 MED ORDER — SODIUM CHLORIDE 0.9 % IV SOLN
10.0000 mg | Freq: Once | INTRAVENOUS | Status: AC
  Administered 2022-06-22: 10 mg via INTRAVENOUS
  Filled 2022-06-22: qty 10

## 2022-06-22 MED ORDER — SODIUM CHLORIDE 0.9% FLUSH
10.0000 mL | INTRAVENOUS | Status: DC | PRN
  Administered 2022-06-22: 10 mL

## 2022-06-22 NOTE — Patient Instructions (Addendum)
Cyclophosphamide Injection What is this medication? CYCLOPHOSPHAMIDE (sye kloe FOSS fa mide) treats some types of cancer. It works by slowing down the growth of cancer cells. This medicine may be used for other purposes; ask your health care provider or pharmacist if you have questions. COMMON BRAND NAME(S): Cyclophosphamide, Cytoxan, Neosar What should I tell my care team before I take this medication? They need to know if you have any of these conditions: Heart disease Irregular heartbeat or rhythm Infection Kidney problems Liver disease Low blood cell levels (white cells, platelets, or red blood cells) Lung disease Previous radiation Trouble passing urine An unusual or allergic reaction to cyclophosphamide, other medications, foods, dyes, or preservatives Pregnant or trying to get pregnant Breast-feeding How should I use this medication? This medication is injected into a vein. It is given by your care team in a hospital or clinic setting. Talk to your care team about the use of this medication in children. Special care may be needed. Overdosage: If you think you have taken too much of this medicine contact a poison control center or emergency room at once. NOTE: This medicine is only for you. Do not share this medicine with others. What if I miss a dose? Keep appointments for follow-up doses. It is important not to miss your dose. Call your care team if you are unable to keep an appointment. What may interact with this medication? Amphotericin B Amiodarone Azathioprine Certain antivirals for HIV or hepatitis Certain medications for blood pressure, such as enalapril, lisinopril, quinapril Cyclosporine Diuretics Etanercept Indomethacin Medications that relax muscles Metronidazole Natalizumab Tamoxifen Warfarin This list may not describe all possible interactions. Give your health care provider a list of all the medicines, herbs, non-prescription drugs, or dietary  supplements you use. Also tell them if you smoke, drink alcohol, or use illegal drugs. Some items may interact with your medicine. What should I watch for while using this medication? This medication may make you feel generally unwell. This is not uncommon as chemotherapy can affect healthy cells as well as cancer cells. Report any side effects. Continue your course of treatment even though you feel ill unless your care team tells you to stop. You may need blood work while you are taking this medication. This medication may increase your risk of getting an infection. Call your care team for advice if you get a fever, chills, sore throat, or other symptoms of a cold or flu. Do not treat yourself. Try to avoid being around people who are sick. Avoid taking medications that contain aspirin, acetaminophen, ibuprofen, naproxen, or ketoprofen unless instructed by your care team. These medications may hide a fever. Be careful brushing or flossing your teeth or using a toothpick because you may get an infection or bleed more easily. If you have any dental work done, tell your dentist you are receiving this medication. Drink water or other fluids as directed. Urinate often, even at night. Some products may contain alcohol. Ask your care team if this medication contains alcohol. Be sure to tell all care teams you are taking this medicine. Certain medicines, like metronidazole and disulfiram, can cause an unpleasant reaction when taken with alcohol. The reaction includes flushing, headache, nausea, vomiting, sweating, and increased thirst. The reaction can last from 30 minutes to several hours. Talk to your care team if you wish to become pregnant or think you might be pregnant. This medication can cause serious birth defects if taken during pregnancy and for 1 year after the last dose. A  negative pregnancy test is required before starting this medication. A reliable form of contraception is recommended while taking  this medication and for 1 year after the last dose. Talk to your care team about reliable forms of contraception. Do not father a child while taking this medication and for 4 months after the last dose. Use a condom during this time period. Do not breast-feed while taking this medication or for 1 week after the last dose. This medication may cause infertility. Talk to your care team if you are concerned about your fertility. Talk to your care team about your risk of cancer. You may be more at risk for certain types of cancer if you take this medication. What side effects may I notice from receiving this medication? Side effects that you should report to your care team as soon as possible: Allergic reactions--skin rash, itching, hives, swelling of the face, lips, tongue, or throat Dry cough, shortness of breath or trouble breathing Heart failure--shortness of breath, swelling of the ankles, feet, or hands, sudden weight gain, unusual weakness or fatigue Heart muscle inflammation--unusual weakness or fatigue, shortness of breath, chest pain, fast or irregular heartbeat, dizziness, swelling of the ankles, feet, or hands Heart rhythm changes--fast or irregular heartbeat, dizziness, feeling faint or lightheaded, chest pain, trouble breathing Infection--fever, chills, cough, sore throat, wounds that don't heal, pain or trouble when passing urine, general feeling of discomfort or being unwell Kidney injury--decrease in the amount of urine, swelling of the ankles, hands, or feet Liver injury--right upper belly pain, loss of appetite, nausea, light-colored stool, dark yellow or brown urine, yellowing skin or eyes, unusual weakness or fatigue Low red blood cell level--unusual weakness or fatigue, dizziness, headache, trouble breathing Low sodium level--muscle weakness, fatigue, dizziness, headache, confusion Red or dark brown urine Unusual bruising or bleeding Side effects that usually do not require medical  attention (report to your care team if they continue or are bothersome): Hair loss Irregular menstrual cycles or spotting Loss of appetite Nausea Pain, redness, or swelling with sores inside the mouth or throat Vomiting This list may not describe all possible side effects. Call your doctor for medical advice about side effects. You may report side effects to FDA at 1-800-FDA-1088. Where should I keep my medication? This medication is given in a hospital or clinic. It will not be stored at home. NOTE: This sheet is a summary. It may not cover all possible information. If you have questions about this medicine, talk to your doctor, pharmacist, or health care provider.  2023 Elsevier/Gold Standard (2021-09-16 00:00:00)  Docetaxel Injection What is this medication? DOCETAXEL (doe se TAX el) treats some types of cancer. It works by slowing down the growth of cancer cells. This medicine may be used for other purposes; ask your health care provider or pharmacist if you have questions. COMMON BRAND NAME(S): Docefrez, Taxotere What should I tell my care team before I take this medication? They need to know if you have any of these conditions: Kidney disease Liver disease Low white blood cell levels Tingling of the fingers or toes or other nerve disorder An unusual or allergic reaction to docetaxel, polysorbate 80, other medications, foods, dyes, or preservatives Pregnant or trying to get pregnant Breast-feeding How should I use this medication? This medication is injected into a vein. It is given by your care team in a hospital or clinic setting. Talk to your care team about the use of this medication in children. Special care may be needed. Overdosage: If  you think you have taken too much of this medicine contact a poison control center or emergency room at once. NOTE: This medicine is only for you. Do not share this medicine with others. What if I miss a dose? Keep appointments for  follow-up doses. It is important not to miss your dose. Call your care team if you are unable to keep an appointment. What may interact with this medication? Do not take this medication with any of the following: Live virus vaccines This medication may also interact with the following: Certain antibiotics, such as clarithromycin, telithromycin Certain antivirals for HIV or hepatitis Certain medications for fungal infections, such as itraconazole, ketoconazole, voriconazole Grapefruit juice Nefazodone Supplements, such as St. John's wort This list may not describe all possible interactions. Give your health care provider a list of all the medicines, herbs, non-prescription drugs, or dietary supplements you use. Also tell them if you smoke, drink alcohol, or use illegal drugs. Some items may interact with your medicine. What should I watch for while using this medication? This medication may make you feel generally unwell. This is not uncommon as chemotherapy can affect healthy cells as well as cancer cells. Report any side effects. Continue your course of treatment even though you feel ill unless your care team tells you to stop. You may need blood work done while you are taking this medication. This medication can cause serious side effects and infusion reactions. To reduce the risk, your care team may give you other medications to take before receiving this one. Be sure to follow the directions from your care team. This medication may increase your risk of getting an infection. Call your care team for advice if you get a fever, chills, sore throat, or other symptoms of a cold or flu. Do not treat yourself. Try to avoid being around people who are sick. Avoid taking medications that contain aspirin, acetaminophen, ibuprofen, naproxen, or ketoprofen unless instructed by your care team. These medications may hide a fever. Be careful brushing or flossing your teeth or using a toothpick because you may  get an infection or bleed more easily. If you have any dental work done, tell your dentist you are receiving this medication. Some products may contain alcohol. Ask your care team if this medication contains alcohol. Be sure to tell all care teams you are taking this medicine. Certain medications, like metronidazole and disulfiram, can cause an unpleasant reaction when taken with alcohol. The reaction includes flushing, headache, nausea, vomiting, sweating, and increased thirst. The reaction can last from 30 minutes to several hours. This medication may affect your coordination, reaction time, or judgement. Do not drive or operate machinery until you know how this medication affects you. Sit up or stand slowly to reduce the risk of dizzy or fainting spells. Drinking alcohol with this medication can increase the risk of these side effects. Talk to your care team about your risk of cancer. You may be more at risk for certain types of cancer if you take this medication. Talk to your care team if you wish to become pregnant or think you might be pregnant. This medication can cause serious birth defects if taken during pregnancy or if you get pregnant within 2 months after stopping therapy. A negative pregnancy test is required before starting this medication. A reliable form of contraception is recommended while taking this medication and for 2 months after stopping it. Talk to your care team about reliable forms of contraception. Do not breast-feed while taking  this medication and for 1 week after stopping therapy. Use a condom during sex and for 4 months after stopping therapy. Tell your care team right away if you think your partner might be pregnant. This medication can cause serious birth defects. This medication may cause infertility. Talk to your care team if you are concerned about your fertility. What side effects may I notice from receiving this medication? Side effects that you should report to your  care team as soon as possible: Allergic reactions--skin rash, itching, hives, swelling of the face, lips, tongue, or throat Change in vision such as blurry vision, seeing halos around lights, vision loss Infection--fever, chills, cough, or sore throat Infusion reactions--chest pain, shortness of breath or trouble breathing, feeling faint or lightheaded Low red blood cell level--unusual weakness or fatigue, dizziness, headache, trouble breathing Pain, tingling, or numbness in the hands or feet Painful swelling, warmth, or redness of the skin, blisters or sores at the infusion site Redness, blistering, peeling, or loosening of the skin, including inside the mouth Sudden or severe stomach pain, bloody diarrhea, fever, nausea, vomiting Swelling of the ankles, hands, or feet Tumor lysis syndrome (TLS)--nausea, vomiting, diarrhea, decrease in the amount of urine, dark urine, unusual weakness or fatigue, confusion, muscle pain or cramps, fast or irregular heartbeat, joint pain Unusual bruising or bleeding Side effects that usually do not require medical attention (report to your care team if they continue or are bothersome): Change in nail shape, thickness, or color Change in taste Hair loss Increased tears This list may not describe all possible side effects. Call your doctor for medical advice about side effects. You may report side effects to FDA at 1-800-FDA-1088. Where should I keep my medication? This medication is given in a hospital or clinic. It will not be stored at home. NOTE: This sheet is a summary. It may not cover all possible information. If you have questions about this medicine, talk to your doctor, pharmacist, or health care provider.  2023 Elsevier/Gold Standard (2007-09-17 00:00:00)  [facility]  Discharge Instructions: Thank you for choosing [facility] to provide your oncology and hematology care.  If you have a lab appointment with  the [facility], please go directly to the [facility] and check in at the registration area.   Wear comfortable clothing and clothing appropriate for easy access to any Portacath or PICC line.   We strive to give you quality time with your provider. You may need to reschedule your appointment if you arrive late (15 or more minutes).  Arriving late affects you and other patients whose appointments are after yours.  Also, if you miss three or more appointments without notifying the office, you may be dismissed from the clinic at the provider's discretion.      For prescription refill requests, have your pharmacy contact our office and allow 72 hours for refills to be completed.    Today you received the following chemotherapy and/or immunotherapy agents DOCETAXEL, CYCLOPHOSPHAMIDE      To help prevent nausea and vomiting after your treatment, we encourage you to take your nausea medication as directed.  BELOW ARE SYMPTOMS THAT SHOULD BE REPORTED IMMEDIATELY: *FEVER GREATER THAN 100.4 F (38 C) OR HIGHER *CHILLS OR SWEATING *NAUSEA AND VOMITING THAT IS NOT CONTROLLED WITH YOUR NAUSEA MEDICATION *UNUSUAL SHORTNESS OF BREATH *UNUSUAL BRUISING OR BLEEDING *URINARY PROBLEMS (pain or burning when urinating, or frequent urination) *BOWEL PROBLEMS (unusual diarrhea, constipation, pain near the anus) TENDERNESS IN  MOUTH AND THROAT WITH OR WITHOUT PRESENCE OF ULCERS (sore throat, sores in mouth, or a toothache) UNUSUAL RASH, SWELLING OR PAIN  UNUSUAL VAGINAL DISCHARGE OR ITCHING   Items with * indicate a potential emergency and should be followed up as soon as possible or go to the Emergency Department if any problems should occur.  Please show the CHEMOTHERAPY ALERT CARD or IMMUNOTHERAPY ALERT CARD at check-in to the Emergency Department and triage nurse.  Should you have questions after your visit or need to cancel or reschedule your appointment, please contact [facility]  Dept: [phone]  and follow the prompts.  Office hours are 8:00 a.m. to 4:30 p.m. Monday - Friday. Please note that voicemails left after 4:00 p.m. may not be returned until the following business day.  We are closed weekends and major holidays. You have access to a nurse at all times for urgent questions. Please call the main number to the clinic Dept: [phone] and follow the prompts.  For any non-urgent questions, you may also contact your provider using MyChart. We now offer e-Visits for anyone 33 and older to request care online for non-urgent symptoms. For details visit [email or web address].   Also download the MyChart app! Go to the app store, search "MyChart", open the app, select [facility], and log in with your MyChart username and password.  Masks are optional in the cancer centers. If you would like for your care team to wear a mask while they are taking care of you, please let them know. You may have one support person who is at least 71 years old accompany you for your appointments.

## 2022-06-23 ENCOUNTER — Ambulatory Visit: Admitting: Dietician

## 2022-06-23 NOTE — Progress Notes (Signed)
Nutrition Follow-up:  Called patient at home/mobile telephone and spoke with patient at daughter [name].  She reports feeling better appetite fine, no nausea, bowels are moving 1-2 times a day but no longer having diarrhea.  She has not yet tried or bought any Glucerna.  Still using CIB 1-2 times a day.    Bruch Ham sandwich  Supper: daughter cooks for her varied meats and veg (loves beets and carrots, can't tolerate tomatoes--break her out)  Snacks:Pop corn, Pringle, strawberries, like cottage cheese and peaches and pears, bought bananas got too ripe ate up in banana pudding  Fluids: Water with SF powder flavoring, has decreased Mt Dew, not electrolyte replacements   Medications: Decadron, KCl  Labs: 06/18/22  Na 133, K 3.3, Glucose 275  Anthropometrics: weight has fluctuated however patient feels good at present weight   Height: 61" Weight:  06/22/22  132# 06/18/22  137# 06/03/22  131# 05/26/24  136.3# 04/28/22   140.8# DBW: 130-135#   BMI: 25.73   INTERVENTION:  Encouraged reducing high carbohydrate fluids.  Encouraged switch to SF CIB at [facility].  Encouraged nutrient dense high protein foods and snacks.    Encouraged high potassium choices as tolerated.  MONITORING, EVALUATION, GOAL: weight, PO intake, Nutrition Impact Symptoms, labs Goal is weight maintenance   Next Visit: PRN at patient or provider request   April Manson, RDN, LDN Registered Dietitian, [facility] Part Time Remote (Usual office hours: Tuesday-Thursday) Cell: [phone]

## 2022-06-24 ENCOUNTER — Inpatient Hospital Stay: Payer: Medicare Other

## 2022-06-24 VITALS — BP 109/61 | HR 120 | Temp 97.9°F | Resp 18 | Ht 61.0 in | Wt 140.0 lb

## 2022-06-24 DIAGNOSIS — Z5111 Encounter for antineoplastic chemotherapy: Secondary | ICD-10-CM | POA: Diagnosis not present

## 2022-06-24 DIAGNOSIS — R569 Unspecified convulsions: Secondary | ICD-10-CM | POA: Diagnosis not present

## 2022-06-24 DIAGNOSIS — E119 Type 2 diabetes mellitus without complications: Secondary | ICD-10-CM | POA: Diagnosis not present

## 2022-06-24 DIAGNOSIS — Z5189 Encounter for other specified aftercare: Secondary | ICD-10-CM | POA: Diagnosis not present

## 2022-06-24 DIAGNOSIS — Z794 Long term (current) use of insulin: Secondary | ICD-10-CM | POA: Diagnosis not present

## 2022-06-24 DIAGNOSIS — J449 Chronic obstructive pulmonary disease, unspecified: Secondary | ICD-10-CM | POA: Diagnosis not present

## 2022-06-24 DIAGNOSIS — Z7952 Long term (current) use of systemic steroids: Secondary | ICD-10-CM | POA: Diagnosis not present

## 2022-06-24 DIAGNOSIS — Z79899 Other long term (current) drug therapy: Secondary | ICD-10-CM | POA: Diagnosis not present

## 2022-06-24 DIAGNOSIS — I2510 Atherosclerotic heart disease of native coronary artery without angina pectoris: Secondary | ICD-10-CM | POA: Diagnosis not present

## 2022-06-24 DIAGNOSIS — Z803 Family history of malignant neoplasm of breast: Secondary | ICD-10-CM | POA: Diagnosis not present

## 2022-06-24 DIAGNOSIS — E876 Hypokalemia: Secondary | ICD-10-CM | POA: Diagnosis not present

## 2022-06-24 DIAGNOSIS — H9190 Unspecified hearing loss, unspecified ear: Secondary | ICD-10-CM | POA: Diagnosis not present

## 2022-06-24 DIAGNOSIS — R911 Solitary pulmonary nodule: Secondary | ICD-10-CM | POA: Diagnosis not present

## 2022-06-24 DIAGNOSIS — E785 Hyperlipidemia, unspecified: Secondary | ICD-10-CM | POA: Diagnosis not present

## 2022-06-24 DIAGNOSIS — G629 Polyneuropathy, unspecified: Secondary | ICD-10-CM | POA: Diagnosis not present

## 2022-06-24 DIAGNOSIS — F17210 Nicotine dependence, cigarettes, uncomplicated: Secondary | ICD-10-CM | POA: Diagnosis not present

## 2022-06-24 DIAGNOSIS — Z7982 Long term (current) use of aspirin: Secondary | ICD-10-CM | POA: Diagnosis not present

## 2022-06-24 DIAGNOSIS — I10 Essential (primary) hypertension: Secondary | ICD-10-CM | POA: Diagnosis not present

## 2022-06-24 DIAGNOSIS — K7460 Unspecified cirrhosis of liver: Secondary | ICD-10-CM | POA: Diagnosis not present

## 2022-06-24 DIAGNOSIS — D72829 Elevated white blood cell count, unspecified: Secondary | ICD-10-CM | POA: Diagnosis not present

## 2022-06-24 DIAGNOSIS — Z171 Estrogen receptor negative status [ER-]: Secondary | ICD-10-CM

## 2022-06-24 DIAGNOSIS — C50412 Malignant neoplasm of upper-outer quadrant of left female breast: Secondary | ICD-10-CM | POA: Diagnosis not present

## 2022-06-24 MED ORDER — PEGFILGRASTIM-CBQV 6 MG/0.6ML ~~LOC~~ SOSY
6.0000 mg | PREFILLED_SYRINGE | Freq: Once | SUBCUTANEOUS | Status: AC
  Administered 2022-06-24: 6 mg via SUBCUTANEOUS
  Filled 2022-06-24: qty 0.6

## 2022-06-24 NOTE — Patient Instructions (Signed)

## 2022-06-24 NOTE — Progress Notes (Signed)
1328 PATIENT C/O HER HANDS PEELING IN CERTAIN PLACES. INFORMED PATIENT TO TRY AQUAFOR. PATIENT VERBALIZED UNDERSTANDING.

## 2022-06-29 DIAGNOSIS — Z955 Presence of coronary angioplasty implant and graft: Secondary | ICD-10-CM | POA: Diagnosis not present

## 2022-06-29 DIAGNOSIS — I4510 Unspecified right bundle-branch block: Secondary | ICD-10-CM | POA: Diagnosis not present

## 2022-06-29 DIAGNOSIS — I2510 Atherosclerotic heart disease of native coronary artery without angina pectoris: Secondary | ICD-10-CM | POA: Diagnosis not present

## 2022-06-29 DIAGNOSIS — R000 Tachycardia, unspecified: Secondary | ICD-10-CM | POA: Diagnosis not present

## 2022-06-29 DIAGNOSIS — I739 Peripheral vascular disease, unspecified: Secondary | ICD-10-CM | POA: Diagnosis not present

## 2022-06-29 DIAGNOSIS — I10 Essential (primary) hypertension: Secondary | ICD-10-CM | POA: Diagnosis not present

## 2022-06-29 DIAGNOSIS — E785 Hyperlipidemia, unspecified: Secondary | ICD-10-CM | POA: Diagnosis not present

## 2022-06-30 ENCOUNTER — Ambulatory Visit: Payer: Self-pay | Admitting: Licensed Clinical Social Worker

## 2022-06-30 ENCOUNTER — Encounter: Payer: Self-pay | Admitting: Licensed Clinical Social Worker

## 2022-06-30 DIAGNOSIS — Z803 Family history of malignant neoplasm of breast: Secondary | ICD-10-CM

## 2022-06-30 DIAGNOSIS — C50412 Malignant neoplasm of upper-outer quadrant of left female breast: Secondary | ICD-10-CM

## 2022-06-30 DIAGNOSIS — Z1379 Encounter for other screening for genetic and chromosomal anomalies: Secondary | ICD-10-CM

## 2022-06-30 DIAGNOSIS — Z800 Family history of malignant neoplasm of digestive organs: Secondary | ICD-10-CM

## 2022-06-30 NOTE — Progress Notes (Signed)
REFERRING PROVIDER: Dr. Hinton Rao   PRIMARY PROVIDER:  Marco Collie, MD  PRIMARY REASON FOR VISIT:  1. Genetic testing   2. Malignant neoplasm of upper-outer quadrant of left breast in female, estrogen receptor negative ([place])   3. Family history of breast cancer   4. Family history of pancreatic cancer      HISTORY OF PRESENT ILLNESS:   Ms. Duncombe, a 71 y.o. female, was seen for a [facility] cancer genetics consultation at the request of Dr. Hinton Rao due to a personal and family history of cancer.  Ms. Vought presents to clinic today to discuss the possibility of a hereditary predisposition to cancer, genetic testing, and to further clarify her future cancer risks, as well as potential cancer risks for family members.   In 2023, at the age of 27, Ms. Fargo was diagnosed with left breast cancer, triple negative.  CANCER HISTORY:  Oncology History  Breast cancer of upper-outer quadrant of left female breast ([place])  03/18/2022 Initial Diagnosis   Breast cancer of upper-outer quadrant of left female breast ([place])   04/28/2022 Cancer Staging   Staging form: Breast, AJCC 8th Edition - Clinical stage from 04/28/2022: Stage IB (cT1c, cN0(sn), cM0, G3, ER-, PR-, HER2-) - Signed by Derwood Kaplan, MD on 04/28/2022 Histopathologic type: Infiltrating duct carcinoma, NOS Stage prefix: Initial diagnosis Method of lymph node assessment: Sentinel lymph node biopsy Nuclear grade: G3 Multigene prognostic tests performed: None Histologic grading system: 3 grade system Laterality: Left Tumor size (mm): 20 Lymph-vascular invasion (LVI): LVI not present (absent)/not identified Diagnostic confirmation: Positive histology Specimen type: Excision Staged by: Managing physician Menopausal status: Postmenopausal Ki-67 (%): 40 Stage used in treatment planning: Yes National guidelines used in treatment planning: Yes Type of national guideline used in treatment planning: NCCN   05/07/2022 -  Chemotherapy    Patient is on Treatment Plan : BREAST TC q21d     05/19/2022 Genetic Testing   Negative hereditary cancer genetic testing: no pathogenic variant detected in [facility] Common Hereditary Cancers +RNA Panel.  Report date is May 19, 2022.    The Common Hereditary Cancers + RNA Panel offered by [facility] includes sequencing, deletion/duplication, and RNA testing of the following 47 genes: APC, ATM, AXIN2, BARD1, BMPR1A, BRCA1, BRCA2, BRIP1, CDH1, CDK4*, CDKN2A (p14ARF)*, CDKN2A (p16INK4a)*, CHEK2, CTNNA1, DICER1, EPCAM (Deletion/duplication testing only), GREM1 (promoter region deletion/duplication testing only), KIT, MEN1, MLH1, MSH2, MSH3, MSH6, MUTYH, NBN, NF1, NHTL1, PALB2, PDGFRA*, PMS2, POLD1, POLE, PTEN, RAD50, RAD51C, RAD51D, SDHB, SDHC, SDHD, SMAD4, SMARCA4. STK11, TP53, TSC1, TSC2, and VHL.  The following genes were evaluated for sequence changes only: SDHA and HOXB13 c.251G>A variant only.  RNA analysis is not performed for the * genes.      Past Medical History:  Diagnosis Date   CAD (coronary artery disease)    a. s/p 2 CoStar DES to RCA 06/2004. b.  LHC 12/09:  EF 65%, oCFX 40%, oRCA 30%, then 30-40% leading into stented seg that is patent, (10-20% ISR), 40% beyond stent,;  c. EF 81%, poor exercise capacity, hypotensive BP response, + chest pain, normal nuclear images (overall low risk)   CKD (chronic kidney disease)    Colon polyps    a. s/p removal 01/2012 - was told one might be cancerous but was not diagnosed with colon cancer - plan is for f/u colonoscopy in 5 yrs.   GERD (gastroesophageal reflux disease)    Hx of echocardiogram    a. Echo 06/2011: EF 55-60%, Gr 1 diast dysfn, mild  MR   Insomnia    Mini stroke    History of, in 2005 (before she was diagnosed with CAD).   Osteoporosis    Other and unspecified hyperlipidemia    Other isolated or specific phobias    Peripheral vascular disease, unspecified ([place])    a. s/p L common femoral & left illiac stenting previously  (patent by angio in 2007). - ABI 06/2011 normal. b. Carotid US 0-39% BICA 06/2011 (for f/u 2 yrs);   c.  ABIs 9/13:  normal (1.1 bilaterally)   Personal history of peptic ulcer disease    Remotely.   Psychiatric pseudoseizure    per patient and family   Tobacco use disorder    Type II or unspecified type diabetes mellitus without mention of complication, not stated as uncontrolled    Unspecified essential hypertension     Past Surgical History:  Procedure Laterality Date   APPENDECTOMY     BREAST BIOPSY     CARDIAC CATHETERIZATION  2009   patent RCA stent. mild CAD otherwise.    CATARACT EXTRACTION, BILATERAL  2016   CHOLECYSTECTOMY  2009   hysterectomy (other)     age 81   KNEE ARTHROSCOPY Left    left arm surgery     LEFT HEART CATHETERIZATION WITH CORONARY ANGIOGRAM N/A 01/16/2014   Procedure: LEFT HEART CATHETERIZATION WITH CORONARY ANGIOGRAM;  Surgeon: Blane Ohara, MD;  Location: [facility] CATH LAB;  Service: Cardiovascular;  Laterality: N/A;   left leg angiography     stenting of the left common iliac artery   left rotator cuff repair     right coronary artery stent      FAMILY HISTORY:  We obtained a detailed, 4-generation family history.  Significant diagnoses are listed below: Family History  Problem Relation Age of Onset   Heart attack Mother    Allergies Mother    Heart attack Father    Allergies Father    Kidney disease Father    Hypertension Father    Melanoma Sister    Coronary artery disease Brother    Breast cancer Maternal Aunt    Breast cancer Maternal Aunt    Breast cancer Paternal Aunt        d. under 70   Pancreatic cancer Maternal Grandmother    Breast cancer Daughter        BRCA1+   Ms. Scaduto has 2 daughters and 1 son. One of her daughters had breast cancer and tested positive for a BRCA1 mutation. Ms. Abraha has 5 siblings. One sister has had melanoma and a brain tumor.  Ms. Morency mother passed at 34. Patient had 2 maternal aunts that had  breast cancer. Her maternal grandmother had pancreatic cancer.   Ms. Quirk father passed of heart issues. A paternal aunt died under age 23 of breast cancer.  Ms. Molesky is aware of previous family history of genetic testing for hereditary cancer risks. There is no reported Ashkenazi Jewish ancestry. There is no known consanguinity.    GENETIC COUNSELING ASSESSMENT: Ms. Carranco is a 71 y.o. female with a personal and family history of breast cancer which is somewhat suggestive of a hereditary cancer syndrome and predisposition to cancer. We, therefore, discussed and recommended the following at today's visit.   DISCUSSION: We discussed that approximately 10% of breast cancer is hereditary. Most cases of hereditary breast cancer are associated with BRCA1/BRCA2 genes, although there are other genes associated with hereditary  cancer as well. Cancers and risks are gene specific.  Since her daughter has a BRCA1 mutation, there was a 50% chance she had it as well. We discussed that testing is beneficial for several reasons including knowing about cancer risks, identifying potential screening and risk-reduction options that may be appropriate, and to understand if other family members could be at risk for cancer and allow them to undergo genetic testing.   We reviewed the characteristics, features and inheritance patterns of hereditary cancer syndromes. We also discussed genetic testing, including the appropriate family members to test, the process of testing, insurance coverage and turn-around-time for results. We discussed the implications of a negative, positive and/or variant of uncertain significant result. We recommended Ms. Guida pursue genetic testing for the [facility] Common Hereditary Cancers+RNA gene panel which was ordered 04/30/2022.  GENETIC TEST RESULTS:  The [facility] Common Hereditary Cancers+RNA Panel found no pathogenic mutations.   The Multi-Cancer + RNA Panel offered by [facility] includes  sequencing and/or deletion/duplication analysis of the following 70 genes:  AIP*, ALK, APC*, ATM*, AXIN2*, BAP1*, BARD1*, BLM*, BMPR1A*, BRCA1*, BRCA2*, BRIP1*, CDC73*, CDH1*, CDK4, CDKN1B*, CDKN2A, CHEK2*, CTNNA1*, DICER1*, EPCAM, EGFR, FH*, FLCN*, GREM1, HOXB13, KIT, LZTR1, MAX*, MBD4, MEN1*, MET, MITF, MLH1*, MSH2*, MSH3*, MSH6*, MUTYH*, NF1*, NF2*, NTHL1*, PALB2*, PDGFRA, PMS2*, POLD1*, POLE*, POT1*, PRKAR1A*, PTCH1*, PTEN*, RAD51C*, RAD51D*, RB1*, RET, SDHA*, SDHAF2*, SDHB*, SDHC*, SDHD*, SMAD4*, SMARCA4*, SMARCB1*, SMARCE1*, STK11*, SUFU*, TMEM127*, TP53*, TSC1*, TSC2*, VHL*. RNA analysis is performed for * genes.   The test report has been scanned into EPIC and is located under the Molecular Pathology section of the Results Review tab.  A portion of the result report is included below for reference. Genetic testing reported out on 05/19/2022.      Even though a pathogenic variant was not identified, possible explanations for the cancer in the family may include: There may be no hereditary risk for cancer in the family. The cancers in Ms. Schellenberg and/or her family may be sporadic/familial or due to other genetic and environmental factors. There may be a gene mutation in one of these genes that current testing methods cannot detect but that chance is small. There could be another gene that has not yet been discovered, or that we have not yet tested, that is responsible for the cancer diagnoses in the family.  It is also possible there is a hereditary cause for the cancer in the family that Ms. Livolsi did not inherit.  Therefore, it is important to remain in touch with cancer genetics in the future so that we can continue to offer Ms. Steel the most up to date genetic testing.   ADDITIONAL GENETIC TESTING:  We discussed with Ms. Redford that her genetic testing was fairly extensive.  If there are additional relevant genes identified to increase cancer risk that can be analyzed in the future, we  would be happy to discuss and coordinate this testing at that time.    CANCER SCREENING RECOMMENDATIONS:  Ms. Slone test result is considered negative (normal).  This means that we have not identified a hereditary cause for her personal history of cancer at this time. We specifically did not find the known familial variant in BRCA1 that is in her daughter.  An individual's cancer risk and medical management are not determined by genetic test results alone. Overall cancer risk assessment incorporates additional factors, including personal medical history, family history, and any available genetic information that may result in a personalized plan for cancer prevention and surveillance. Therefore, it is recommended she continue to follow the cancer management  and screening guidelines provided by her oncology and primary healthcare provider.  RECOMMENDATIONS FOR FAMILY MEMBERS:   Since she did not inherit a identifiable mutation in a cancer predisposition gene included on this panel, her children could not have inherited a known mutation from her in one of these genes. Individuals in this family might be at some increased risk of developing cancer, over the general population risk, due to the family history of cancer.  Individuals in the family should notify their providers of the family history of cancer. We recommend women in this family have a yearly mammogram beginning at age 22, or 31 years younger than the earliest onset of cancer, an annual clinical breast exam, and perform monthly breast self-exams.  Family members should have colonoscopies by at age 76, or earlier, as recommended by their providers. Other members of the family may still carry a pathogenic variant in one of these genes that Ms. Wilden did not inherit. Based on the family history, we recommend her daughter's relatives have genetic counseling and testing for the BRCA1 mutation found in her. Ms. Tieszen will let us know if we can be of  any assistance in coordinating genetic counseling and/or testing for this family member.     FOLLOW-UP:  Lastly, we discussed with Ms. Im that cancer genetics is a rapidly advancing field and it is possible that new genetic tests will be appropriate for her and/or her family members in the future. We encouraged her to remain in contact with cancer genetics on an annual basis so we can update her personal and family histories and let her know of advances in cancer genetics that may benefit this family.   Our contact number was provided. Ms. Koehn questions were answered to her satisfaction, and she knows she is welcome to call us at anytime with additional questions or concerns.    Faith Rogue, MS, [facility] Genetic Counselor [email or web address] Phone: [phone]

## 2022-07-06 ENCOUNTER — Telehealth: Payer: Self-pay

## 2022-07-06 ENCOUNTER — Other Ambulatory Visit

## 2022-07-06 ENCOUNTER — Encounter: Admitting: Licensed Clinical Social Worker

## 2022-07-06 NOTE — Telephone Encounter (Addendum)
I notified pt's dtr, Roanna Epley, of Dr Remi Deter response below. She verbalized understanding. ----- Message from Derwood Kaplan, MD sent at 07/06/2022 11:25 AM EST ----- Regarding: RE: red spots on Right big toe and ankle Contact: [phone] It sounds like it can wait until her appt this week unless worsens ----- Message ----- From: Dairl Ponder, RN Sent: 07/06/2022   9:51 AM EST To: Derwood Kaplan, MD; Marvia Pickles, PA-C Subject: red spots on Right big toe and ankle           Received call from pt. She reports that she has red spots on her big toe and ankles on the right side. They do not itch, and not painful. Her feet are not numb, swollen, or warm to touch. No areas on left foot/ankle. She mentions her feet do hurt when she walks "when pressure is applied". Afebrile. Please advise.

## 2022-07-08 ENCOUNTER — Encounter: Payer: Self-pay | Admitting: Oncology

## 2022-07-08 DIAGNOSIS — E1169 Type 2 diabetes mellitus with other specified complication: Secondary | ICD-10-CM | POA: Diagnosis not present

## 2022-07-08 DIAGNOSIS — E1165 Type 2 diabetes mellitus with hyperglycemia: Secondary | ICD-10-CM | POA: Diagnosis not present

## 2022-07-08 NOTE — Progress Notes (Signed)
Expand All Collapse All  [facility]  [address] [place],  [place]  [address] [phone]   Clinic Day: 06/18/2022   Referring physician: Marco Collie, MD     ASSESSMENT & PLAN:  Stage IA Left breast cancer This is a grade 3 triple negative 21 mm tumor for a T1cN0M0 with a Ki-67 of 40%. She has had resection with clear margins and negative sentinel node. However, I do recommend adjuvant chemotherapy and she is agreeable. Given her age and co-morbidities, she is on docetaxol and cytoxan every 3 weeks for 4 cycles to avoid anthracyclines.     Hypokalemia Her potassium has dropped from 3.4 to 3.0 so I will give IV supplement of 40 meq and start her on 20 meq oral daily.   Leukocytosis This is from her Neulasta  Her white count is 33,500.   Neuropathy She complains of numbness and pain of both legs that she rates as a 9/10. She also complains of back pain, which could be related to the Neulasta.   Family history of breast cancer and BRCA positivity Yet she is BRCA negative.   Lung nodule This is a ground glass nodule of the right upper lobe which has shown indolent growth from 1.8 cm in April of 2019 to 2.2 cm currently. We will plan to monitor this with repeat imaging periodically.   Liver cirrhosis Liver is decreased in attenuation diffusely with mildly irregular margins. I don't think she was aware of this diagnosis but it may explain her increased toxicities.   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   Memory impairment This is consistent with early dementia. She also has significant hearing impairment.     Plan: We will see her back in 3 weeks with repeat CBC and CMP for her fourth and final cycle. I discussed the assessment and treatment plan with the patient and her family.  The patient was provided an opportunity to ask questions and  all were answered.  The patient agreed with the plan and demonstrated an understanding of the instructions.  The patient was advised to call back if the symptoms worsen or if the condition fails to improve as anticipated.     I provided 20 minutes of face-to-face time during this this encounter and > 50% was spent counseling as documented under my assessment and plan.    Derwood Kaplan, MD [place] [address] [place] [address] Dept: [phone] Dept Fax: [phone]        CHIEF COMPLAINT:  CC: Invasive ductal carcinoma   Current Treatment:  chemotherapy     HISTORY OF PRESENT ILLNESS:       Oncology History  Breast cancer of upper-outer quadrant of left female breast ([facility])  03/18/2022 Initial Diagnosis    Breast cancer of upper-outer quadrant of left female breast ([facility])    04/28/2022 Cancer Staging    Staging form: Breast, AJCC 8th Edition - Clinical stage from 04/28/2022: Stage IB (cT1c, cN0(sn), cM0, G3, ER-, PR-, HER2-) - Signed by Derwood Kaplan, MD on 04/28/2022 Histopathologic type: Infiltrating duct carcinoma, NOS Stage prefix: Initial diagnosis Method of lymph node assessment: Sentinel lymph node biopsy Nuclear grade: G3 Multigene prognostic tests performed: None Histologic grading system: 3 grade system Laterality: Left Tumor size (mm): 20 Lymph-vascular invasion (LVI): LVI not present (absent)/not identified Diagnostic confirmation:  Positive histology Specimen type: Excision Staged by: Managing physician Menopausal status: Postmenopausal Ki-67 (%): 40 Stage used in treatment planning: Yes National guidelines used in treatment planning: Yes Type of national guideline used in treatment planning: NCCN    05/07/2022 -  Chemotherapy    Patient is on Treatment Plan : BREAST TC q21d     05/19/2022 Genetic Testing    Negative hereditary cancer genetic testing: no pathogenic  variant detected in [facility] Common Hereditary Cancers +RNA Panel.  Report date is May 19, 2022.     The Common Hereditary Cancers + RNA Panel offered by [facility] includes sequencing, deletion/duplication, and RNA testing of the following 47 genes: APC, ATM, AXIN2, BARD1, BMPR1A, BRCA1, BRCA2, BRIP1, CDH1, CDK4*, CDKN2A (p14ARF)*, CDKN2A (p16INK4a)*, CHEK2, CTNNA1, DICER1, EPCAM (Deletion/duplication testing only), GREM1 (promoter region deletion/duplication testing only), KIT, MEN1, MLH1, MSH2, MSH3, MSH6, MUTYH, NBN, NF1, NHTL1, PALB2, PDGFRA*, PMS2, POLD1, POLE, PTEN, RAD50, RAD51C, RAD51D, SDHB, SDHC, SDHD, SMAD4, SMARCA4. STK11, TP53, TSC1, TSC2, and VHL.  The following genes were evaluated for sequence changes only: SDHA and HOXB13 c.251G>A variant only.  RNA analysis is not performed for the * genes.      Ailen is a 71 year old woman who had a screening mammogram on 01/27/2022 and was found to have a possible left breast mass. She does get yearly mammograms due to her strong family history. She thought  she could feel a lump after this was found. She had a diagnostic mammogram on 02/11/2022 with a persistent spiculated hyperdense mass with associated calcifications in the upper inner quadrant. Ultrasound confirmed an irregular hypoechoic mass with peripheral vascularity at 11:30, 3 cm from the nipple. This measures 2.2 cm and the left axilla was negative. She was referred to [name] after a biopsy was done on 02/17/2022, which confirmed a grade 3 invasive ductal carcinoma which is triple negative with a Ki-67 of 40%. [name] took her to surgery on 03/18/2022 for lumpectomy and sentinel lymph node. The final pathology revealed a 20 mm grade 3 invasive ductal carcinoma with DCIS. The margins were clear and the lymph node was negative for a T1cN0M0, stage IA. She has had a port placed.     INTERVAL HISTORY:  Shanera is seen here today for recently diagnosed invasive ductal carcinoma, after her second  cycle of TC chemotherapy. She did much better with the second cycle. She will receive her third cycle on Monday. She states her knees have been weak describes it as buckling so she has been having to hold on to things to keep stable. She states her left knee hurts the most.  Appetite is not good, nothing tastes right. Her weight today has increased 6 pounds since her last visit. She denies fever, chills, night sweats, or other signs of infection. She denies cardiorespiratory and gastrointestinal issues. She  denies any other pain.      SOCIAL HISTORY She lives with her first daughter, who is present at today's visit. She has  2 daughters. She smokes 1.5 packs a day. Has been smoking since 71 yo. She is not sure about quitting. She did smoke 2 packs per day for over 50 years. She doesn't drink other than rare occasion She is widowed She has been married 4 times She is disabled   Menstrual History 71 years old when she had her first child She has had 3 children 36 years old when starting menstrual period Menopause started at 25 She has had a partial hysterectomy in her 59's  and went back for complete hysterectomy/BSO later. No hormone replacement therapy   FAMILY HISTORY Daughter had breast cancer at age 22 Aunt had breast cancer at age 17 Her sister had a brain tumor at age 80     SURGICAL HISTORY She had a partial Hysterectomy in her late 21's, and went back a few years later to have BSO. Tonsillectomy cataract surgery  Stents of the iliac and femoral arteries. Nerve repair of the left upper extremity   PAST MEDICAL HISTORY Osteoporosis Coronary artery disease COPD Diabetes  Migraine headaches Hyperlipidemia Hypertension TIA Anxiety GERD Peripheral vascular disease Hard of hearing       REVIEW OF SYSTEMS:  Review of Systems  Constitutional: Negative.   HENT:  Negative.    Eyes: Negative.   Respiratory: Negative.    Gastrointestinal: Negative.   Endocrine:  Negative.   Musculoskeletal: Negative.   Skin: Negative.   Neurological: Negative.        There is a question of possible early dementia since she has memory impairment  Hematological: Negative.   Psychiatric/Behavioral: Negative.      VITALS:  Blood pressure (!) 112/55, pulse (!) 119, temperature 97.8 F (36.6 C), temperature source Oral, resp. rate 20, height _0  (1.549 m), weight 137 lb 1.6 oz (62.2 kg), SpO2 96 %.     Wt Readings from Last 3 Encounters:  06/24/22 140 lb (63.5 kg)  06/22/22 132 lb 1.3 oz (59.9 kg)  06/18/22 137 lb 1.6 oz (62.2 kg)    Body mass index is 25.9 kg/m.   Performance status (ECOG): 0 - Asymptomatic   PHYSICAL EXAM:  Physical Exam Constitutional:      General: She is not in acute distress.    Appearance: Normal appearance. She is normal weight. She is not ill-appearing or toxic-appearing.  HENT:     Head: Normocephalic and atraumatic.     Ears:     Comments: Bilateral otitis media with a small amount of blood in the left ear canal    Nose: Nose normal.     Mouth/Throat:     Mouth: Mucous membranes are dry.  Eyes:     General: No scleral icterus.    Extraocular Movements: Extraocular movements intact.     Conjunctiva/sclera: Conjunctivae normal.     Pupils: Pupils are equal, round, and reactive to light.  Cardiovascular:     Rate and Rhythm: Normal rate and regular rhythm.     Pulses: Normal pulses.     Heart sounds: Normal heart sounds. No murmur heard.    No friction rub. No gallop.  Pulmonary:     Effort: Pulmonary effort is normal. No respiratory distress.     Breath sounds: Normal breath sounds. No wheezing or rales.  Chest:     Chest wall: No mass.  Breasts:    Right: Normal. No mass.     Left: No mass.     Comments: She has several scars around the nipple areolar complex from the dye injections. She has a healing scar in the superior left breast. Healing left scar of the left axilla. No masses in either breast. Abdominal:      General: Bowel sounds are normal. There is no distension.     Palpations: Abdomen is soft. There is no hepatomegaly, splenomegaly or mass.     Tenderness: There is no abdominal tenderness.  Musculoskeletal:        General: Normal range of motion.     Cervical back: Normal range of motion  and neck supple.     Right lower leg: No edema.     Left lower leg: No edema.  Lymphadenopathy:     Cervical: No cervical adenopathy.  Skin:    General: Skin is warm and dry.  Neurological:     General: No focal deficit present.     Mental Status: She is alert and oriented to person, place, and time. Mental status is at baseline.  Psychiatric:        Mood and Affect: Mood normal.        Behavior: Behavior normal.        Thought Content: Thought content normal.        Judgment: Judgment normal.      LABS:        Latest Ref Rng & Units 06/18/2022   12:00 AM 05/26/2022   12:00 AM 05/18/2022   12:00 AM  CBC  WBC   11.5     19.1     33.5      Hemoglobin 12.0 - 16.0 12.5     12.2     14.9      Hematocrit 36 - 46 38     37     45      Platelets 150 - 400 K/uL 234     238     247         This result is from an external source.        Latest Ref Rng & Units 06/18/2022   12:00 AM 05/26/2022   12:00 AM 05/18/2022   12:00 AM  CMP  BUN 4 - _0 37      Creatinine 0.5 - 1.1 0.8     1.0     1.3      Sodium 137 - 147 133     136     138      Potassium 3.5 - 5.1 mEq/L 3.3     3.6     3.0      Chloride 99 - 108 98     104     103      CO2 13 - _1 Calcium 8.7 - 10.7 9.0     8.6     9.0      Total Protein 6.3 - 8.2 g/dL   6        Alkaline Phos 25 - 125 105     71     120      AST 13 - 35 _2 ALT 7 - 35 U/L _3 This result is from an external source.        Recent Labs  No results found for: "CEA1", "CEA"   /  Last Labs  No results found for: "CEA1", "CEA"   Recent Labs  No results found for: "PSA1"   Recent Labs  No  results found for: "MVV612"   Recent Labs  No results found for: "CAN125"    Recent Labs  No results found for: "TOTALPROTELP", "ALBUMINELP", "A1GS", "A2GS", "BETS", "BETA2SER", "GAMS", "MSPIKE", "SPEI"   Recent Labs  No results found for: "TIBC", "FERRITIN", "IRONPCTSAT"   Recent Labs  No results found for: "LDH"       STUDIES:  EXAM:05/05/22 NUCLEAR MEDICINE WHOLE BODY BONE SCAN IMPRESSION: Uptake at LEFT tibial metaphysis especially medially, nonspecific; radiographic correlation recommended to exclude metastatic focus.   Focal abnormal tracer uptake at the LEFT temporo-occipital calvarium, corresponding to and focus of abnormal marrow signal on prior MR head exams back to 2010, nonspecific but likely benign.   No additional scintigraphic findings to suggest osseous metastatic disease.   Diffuse calvarial uptake, favoring metabolic bone disease.    EXAM:05/05/22 CT CHEST WITH CONTRAST IMPRESSION: Ground glass nodule in the right upper lobe with a possible developing internal solid component. Lesion has shown indolent growth from 11/11/2017, at which time it measured approcimately 1.2 x 1.8 cm. Findings are worrisome for adenocarcinoma.  No definitive evidence of metastatic breast cancer. Steatotic cirrhotic liver   EXAM:03/18/2022 REPORT SURGICAL PATHOLOGY DIAGNOSIS Breast, lumpectomy, Left UOQ Invasive ductal carcinoma, grade 3, and ductal carcinoma in situ Ribbon clip and biopsy site present All margins negative for invasive carcinoma and DCIS Fibrocystic changes Lymph node, sentinel, biopsy One lymph node negative for tumor (0/1)   Estrogen receptor: 0% Progesterone receptor: 0% HER2 negative at 1+ Proliferation marker Ki-67: 40%   Bone density scan 01/27/2022 1.  Osteoporosis of the spine with a T score of -2.5, worse 2.  Left femur - osteoporosis with a T score of -2.7, slightly better 3.  Dual femur total mean-osteoporosis with a T score of -2.5, worse      Allergies:       Allergies  Allergen Reactions   Amoxicillin Hives      vomiting   Celecoxib Hives   Claritin [Loratadine] Other (See Comments)      Pt's daughter notified us of this allergy.   Insulins Nausea And Vomiting      Patient unsure of name of insulin, will call us back   Vioxx [Rofecoxib] Hives   Liraglutide Nausea And Vomiting   Propranolol Nausea And Vomiting      diarrhea diarrhea diarrhea        Current Medications:       Current Outpatient Medications  Medication Sig Dispense Refill   aspirin EC 81 MG tablet Take 81 mg by mouth daily.       atenolol (TENORMIN) 50 MG tablet Take 75 mg by mouth 2 (two) times daily.        busPIRone (BUSPAR) 7.5 MG tablet Take 1 tablet by mouth in the morning, at noon, and at bedtime.       dexamethasone (DECADRON) 4 MG tablet Take 2 tabs by mouth 2 times daily starting day before chemo. Then take 2 tabs daily for 2 days starting day after chemo. Take with food. 30 tablet 1   DULoxetine (CYMBALTA) 60 MG capsule Take 120 mg by mouth at bedtime.        famotidine (PEPCID) 20 MG tablet Take 20 mg by mouth 2 (two) times daily.       fenofibrate (TRICOR) 145 MG tablet Take 145 mg by mouth daily.       fexofenadine (ALLEGRA) 180 MG tablet Take 180 mg by mouth daily.       gabapentin (NEURONTIN) 600 MG tablet Take 600 mg by mouth 3 (three) times daily.       hydrALAZINE (APRESOLINE) 50 MG tablet Take 50 mg by mouth 3 (three) times daily.       lidocaine-prilocaine (EMLA) cream Apply to affected area once 30 g 3   LORazepam (  ATIVAN) 0.5 MG tablet Take 1 tablet (0.5 mg total) by mouth at bedtime as needed for sleep. May take one tab prior to imaging studies due to anxiety 30 tablet 0   Melatonin 1 MG CAPS Take by mouth. (Patient not taking: Reported on 04/30/2022)       metoprolol tartrate (LOPRESSOR) 25 MG tablet Take 25 mg by mouth 2 (two) times daily.       nicotine (NICODERM CQ - DOSED IN MG/24 HOURS) 21 mg/24hr patch Place 1 patch (21  mg total) onto the skin daily. 28 patch 0   nitroGLYCERIN (NITROSTAT) 0.4 MG SL tablet Place 0.4 mg under the tongue every 5 (five) minutes as needed. For chest pain       ondansetron (ZOFRAN-ODT) 4 MG disintegrating tablet Take 4 mg by mouth every 6 (six) hours as needed.       oxyCODONE (OXY IR/ROXICODONE) 5 MG immediate release tablet Take 5 mg by mouth every 6 (six) hours as needed.       pantoprazole (PROTONIX) 40 MG tablet Take 40 mg by mouth 2 (two) times daily.       potassium chloride (KLOR-CON M) 10 MEQ tablet Take 1 tablet (10 mEq total) by mouth daily. 30 tablet 5   prochlorperazine (COMPAZINE) 10 MG tablet Take 1 tablet (10 mg total) by mouth every 6 (six) hours as needed for nausea or vomiting. 30 tablet 1   tirzepatide (MOUNJARO) 2.5 MG/0.5ML Pen INJECT 2.5 MILLIGRAM EVERY WEEK       TRESIBA FLEXTOUCH 100 UNIT/ML FlexTouch Pen Inject into the skin.       valsartan-hydrochlorothiazide (DIOVAN-HCT) 320-25 MG tablet TAKE 1 TABLET BY ORAL ROUTE ONCE DAILY FOR HIGH BLOOD PRESSURE       zolpidem (AMBIEN) 10 MG tablet Take 10 mg by mouth at bedtime as needed for sleep.  (Patient not taking: Reported on 04/30/2022)        No current facility-administered medications for this visit.      I,Gabriella Ballesteros,acting as a scribe for Derwood Kaplan, MD.,have documented all relevant documentation on the behalf of Derwood Kaplan, MD,as directed by  Derwood Kaplan, MD while in the presence of Derwood Kaplan, MD.

## 2022-07-09 ENCOUNTER — Encounter: Payer: Self-pay | Admitting: Oncology

## 2022-07-09 ENCOUNTER — Other Ambulatory Visit: Payer: Self-pay | Admitting: Oncology

## 2022-07-09 ENCOUNTER — Inpatient Hospital Stay (INDEPENDENT_AMBULATORY_CARE_PROVIDER_SITE_OTHER): Payer: Medicare Other | Admitting: Oncology

## 2022-07-09 ENCOUNTER — Inpatient Hospital Stay: Payer: Medicare Other

## 2022-07-09 VITALS — BP 118/78 | HR 115 | Temp 97.5°F | Resp 18 | Ht 61.0 in | Wt 142.8 lb

## 2022-07-09 DIAGNOSIS — G620 Drug-induced polyneuropathy: Secondary | ICD-10-CM | POA: Diagnosis not present

## 2022-07-09 DIAGNOSIS — H9190 Unspecified hearing loss, unspecified ear: Secondary | ICD-10-CM | POA: Diagnosis not present

## 2022-07-09 DIAGNOSIS — T451X5A Adverse effect of antineoplastic and immunosuppressive drugs, initial encounter: Secondary | ICD-10-CM

## 2022-07-09 DIAGNOSIS — Z5111 Encounter for antineoplastic chemotherapy: Secondary | ICD-10-CM | POA: Diagnosis not present

## 2022-07-09 DIAGNOSIS — Z7952 Long term (current) use of systemic steroids: Secondary | ICD-10-CM | POA: Diagnosis not present

## 2022-07-09 DIAGNOSIS — G629 Polyneuropathy, unspecified: Secondary | ICD-10-CM | POA: Diagnosis not present

## 2022-07-09 DIAGNOSIS — R911 Solitary pulmonary nodule: Secondary | ICD-10-CM | POA: Diagnosis not present

## 2022-07-09 DIAGNOSIS — Z171 Estrogen receptor negative status [ER-]: Secondary | ICD-10-CM

## 2022-07-09 DIAGNOSIS — R569 Unspecified convulsions: Secondary | ICD-10-CM | POA: Diagnosis not present

## 2022-07-09 DIAGNOSIS — F17210 Nicotine dependence, cigarettes, uncomplicated: Secondary | ICD-10-CM | POA: Diagnosis not present

## 2022-07-09 DIAGNOSIS — D72829 Elevated white blood cell count, unspecified: Secondary | ICD-10-CM | POA: Diagnosis not present

## 2022-07-09 DIAGNOSIS — I10 Essential (primary) hypertension: Secondary | ICD-10-CM | POA: Diagnosis not present

## 2022-07-09 DIAGNOSIS — D649 Anemia, unspecified: Secondary | ICD-10-CM | POA: Diagnosis not present

## 2022-07-09 DIAGNOSIS — E876 Hypokalemia: Secondary | ICD-10-CM | POA: Diagnosis not present

## 2022-07-09 DIAGNOSIS — K7460 Unspecified cirrhosis of liver: Secondary | ICD-10-CM | POA: Diagnosis not present

## 2022-07-09 DIAGNOSIS — Z7982 Long term (current) use of aspirin: Secondary | ICD-10-CM | POA: Diagnosis not present

## 2022-07-09 DIAGNOSIS — E119 Type 2 diabetes mellitus without complications: Secondary | ICD-10-CM | POA: Diagnosis not present

## 2022-07-09 DIAGNOSIS — Z803 Family history of malignant neoplasm of breast: Secondary | ICD-10-CM | POA: Diagnosis not present

## 2022-07-09 DIAGNOSIS — Z79899 Other long term (current) drug therapy: Secondary | ICD-10-CM | POA: Diagnosis not present

## 2022-07-09 DIAGNOSIS — J449 Chronic obstructive pulmonary disease, unspecified: Secondary | ICD-10-CM | POA: Diagnosis not present

## 2022-07-09 DIAGNOSIS — C50412 Malignant neoplasm of upper-outer quadrant of left female breast: Secondary | ICD-10-CM

## 2022-07-09 DIAGNOSIS — Z794 Long term (current) use of insulin: Secondary | ICD-10-CM | POA: Diagnosis not present

## 2022-07-09 DIAGNOSIS — I2510 Atherosclerotic heart disease of native coronary artery without angina pectoris: Secondary | ICD-10-CM | POA: Diagnosis not present

## 2022-07-09 DIAGNOSIS — J0100 Acute maxillary sinusitis, unspecified: Secondary | ICD-10-CM

## 2022-07-09 DIAGNOSIS — E785 Hyperlipidemia, unspecified: Secondary | ICD-10-CM | POA: Diagnosis not present

## 2022-07-09 DIAGNOSIS — Z5189 Encounter for other specified aftercare: Secondary | ICD-10-CM | POA: Diagnosis not present

## 2022-07-09 LAB — CBC AND DIFFERENTIAL
HCT: 31 — AB (ref 36–46)
Hemoglobin: 10.4 — AB (ref 12.0–16.0)
Neutrophils Absolute: 9.83
Platelets: 239 10*3/uL (ref 150–400)
WBC: 12.6

## 2022-07-09 LAB — CMP (CANCER CENTER ONLY)
ALT: 8 U/L (ref 0–44)
AST: 15 U/L (ref 15–41)
Albumin: 2.8 g/dL — ABNORMAL LOW (ref 3.5–5.0)
Alkaline Phosphatase: 58 U/L (ref 38–126)
Anion gap: 8 (ref 5–15)
BUN: 11 mg/dL (ref 8–23)
CO2: 25 mmol/L (ref 22–32)
Calcium: 8.6 mg/dL — ABNORMAL LOW (ref 8.9–10.3)
Chloride: 108 mmol/L (ref 98–111)
Creatinine: 0.9 mg/dL (ref 0.44–1.00)
GFR, Estimated: >60 mL/min (ref >60)
Glucose, Bld: 184 mg/dL — ABNORMAL HIGH (ref 70–99)
Potassium: 4.4 mmol/L (ref 3.5–5.1)
Sodium: 141 mmol/L (ref 135–145)
Total Bilirubin: 0.5 mg/dL (ref 0.3–1.2)
Total Protein: 5.4 g/dL — ABNORMAL LOW (ref 6.5–8.1)

## 2022-07-09 LAB — CBC: RBC: 3.37 — AB (ref 3.87–5.11)

## 2022-07-09 LAB — CBC W DIFFERENTIAL (~~LOC~~ CC SCANNED REPORT)

## 2022-07-09 MED ORDER — AZITHROMYCIN 250 MG PO TABS: ORAL_TABLET | ORAL | 0 refills | Status: DC

## 2022-07-09 MED ORDER — HYDROCODONE-ACETAMINOPHEN 5-325 MG PO TABS: 1.0000 | ORAL_TABLET | ORAL | 0 refills | Status: DC | PRN

## 2022-07-09 NOTE — Progress Notes (Deleted)
Expand All Collapse All  [facility]  [address] [place],  [place]  [address] [phone]   Clinic Day: 07/09/22    Referring physician: Marco Collie, MD     ASSESSMENT & PLAN:  Stage IA Left breast cancer This is a grade 3 triple negative 21 mm tumor for a T1cN0M0 with a Ki-67 of 40%. She has had resection with clear margins and negative sentinel node. However, I recommended adjuvant chemotherapy with docetaxol and cytoxan every 3 weeks for 4 cycles to avoid anthracyclines.     Hypokalemia Her potassium had dropped so I gave IV supplement and started her on 20 meq oral daily.   Leukocytosis This is from her Neulasta  Her white count is 12,600 today, much lower.   Neuropathy She complains of numbness and pain of both legs that she rates as a 9/10. She also complains of back pain, which could be related to the Neulasta. Neuropathy of her feet is considerably worse and I will reduce her chemotherapy dose by another 15%.   Family history of breast cancer and BRCA positivity Yet she is BRCA negative.   Lung nodule This is a ground glass nodule of the right upper lobe which has shown indolent growth from 1.8 cm in April of 2019 to 2.2 cm currently. We will plan to monitor this with repeat imaging periodically.   Liver cirrhosis Liver is decreased in attenuation diffusely with mildly irregular margins. I don't think she was aware of this diagnosis but it may explain her increased toxicities.   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   Memory impairment This is consistent with early dementia. She also has significant hearing impairment.  ACUTE SINUSITIS She describes yellow drainage with blood. I will prescribe Zithromax.    Plan: She will receive her fourth and final cycle of chemotherapy 07/13/22 I will get her sinusitis treated now and she  will receive 1 liter of NS tomorrow. We will see her back in 1 month with repeat CBC and CMP for her. We will then discuss referral for radiation.  I discussed the assessment and treatment plan with the patient and her family.  The patient was provided an opportunity to ask questions and all were answered.  The patient agreed with the plan and demonstrated an understanding of the instructions.  The patient was advised to call back if the symptoms worsen or if the condition fails to improve as anticipated.  Her worsening neuropathy and degenerative disease of the spine have caused her severe inability to ambulate and she requires a wheelchair. Her mobility limitation cannot be sufficiently resolved with the use of a cane or walker. The use of a wheelchair will significantly improve her ability to participate in activities of daily living and she will require the use on a regular basis in the home.     I provided 20 minutes of face-to-face time during this this encounter and > 50% was spent counseling as documented under my assessment and plan.    Derwood Kaplan, MD [facility] [address] [place] [address] Dept: [phone] Dept Fax: [phone]        CHIEF COMPLAINT:  CC: Invasive ductal carcinoma   Current Treatment:  chemotherapy     HISTORY OF PRESENT ILLNESS:       Oncology History  Breast cancer of  upper-outer quadrant of left female breast ([facility])  03/18/2022 Initial Diagnosis    Breast cancer of upper-outer quadrant of left female breast ([facility])    04/28/2022 Cancer Staging    Staging form: Breast, AJCC 8th Edition - Clinical stage from 04/28/2022: Stage IB (cT1c, cN0(sn), cM0, G3, ER-, PR-, HER2-) - Signed by Derwood Kaplan, MD on 04/28/2022 Histopathologic type: Infiltrating duct carcinoma, NOS Stage prefix: Initial diagnosis Method of lymph node assessment: Sentinel lymph node biopsy Nuclear  grade: G3 Multigene prognostic tests performed: None Histologic grading system: 3 grade system Laterality: Left Tumor size (mm): 20 Lymph-vascular invasion (LVI): LVI not present (absent)/not identified Diagnostic confirmation: Positive histology Specimen type: Excision Staged by: Managing physician Menopausal status: Postmenopausal Ki-67 (%): 40 Stage used in treatment planning: Yes National guidelines used in treatment planning: Yes Type of national guideline used in treatment planning: NCCN    05/07/2022 -  Chemotherapy    Patient is on Treatment Plan : BREAST TC q21d     05/19/2022 Genetic Testing    Negative hereditary cancer genetic testing: no pathogenic variant detected in [facility] Common Hereditary Cancers +RNA Panel.  Report date is May 19, 2022.     The Common Hereditary Cancers + RNA Panel offered by [facility] includes sequencing, deletion/duplication, and RNA testing of the following 47 genes: APC, ATM, AXIN2, BARD1, BMPR1A, BRCA1, BRCA2, BRIP1, CDH1, CDK4*, CDKN2A (p14ARF)*, CDKN2A (p16INK4a)*, CHEK2, CTNNA1, DICER1, EPCAM (Deletion/duplication testing only), GREM1 (promoter region deletion/duplication testing only), KIT, MEN1, MLH1, MSH2, MSH3, MSH6, MUTYH, NBN, NF1, NHTL1, PALB2, PDGFRA*, PMS2, POLD1, POLE, PTEN, RAD50, RAD51C, RAD51D, SDHB, SDHC, SDHD, SMAD4, SMARCA4. STK11, TP53, TSC1, TSC2, and VHL.  The following genes were evaluated for sequence changes only: SDHA and HOXB13 c.251G>A variant only.  RNA analysis is not performed for the * genes.      Lindsay Brooks is a 71 year old woman who had a screening mammogram on 01/27/2022 and was found to have a possible left breast mass. She does get yearly mammograms due to her strong family history. She thought  she could feel a lump after this was found. She had a diagnostic mammogram on 02/11/2022 with a persistent spiculated hyperdense mass with associated calcifications in the upper inner quadrant. Ultrasound confirmed an irregular  hypoechoic mass with peripheral vascularity at 11:30, 3 cm from the nipple. This measures 2.2 cm and the left axilla was negative. She was referred to [name] after a biopsy was done on 02/17/2022, which confirmed a grade 3 invasive ductal carcinoma which is triple negative with a Ki-67 of 40%. [name] took her to surgery on 03/18/2022 for lumpectomy and sentinel lymph node. The final pathology revealed a 20 mm grade 3 invasive ductal carcinoma with DCIS. The margins were clear and the lymph node was negative for a T1cN0M0, stage IA. She has had a port placed.     INTERVAL HISTORY:  Kizzy is seen here today for invasive ductal carcinoma, after her third cycle of TC chemotherapy. She has trace edema of the lower extremities but her blood counts are not normal her hemoglobin is 10.4 and white count 12.6. She has tender nodules on the bottom of her feet that causes her pain with pressure and difficulty with ambulating. She has worsening neuropathy. A walker and cane are not sufficient for her mobility limitation. Use of a manual wheelchair will significantly improve the beneficiary's ability to participate in MRADLs and the beneficiary will use it on a regular basis. She does also suffer from COPD and is  experiencing shortness of breath when walking. However, putting arms over her head does trigger pseudoseizures. Patient is dehydrated and will receive 1 liter of NS on 11/31/23. She is scheduled for her next chemo appointment on 07/13/22. I prescribed hydrocodone 5 mg for neuropathy pain; she can take whole or half. She is already on Gabapentin and Cymbalta. She has a sinus infection with yellow drainage and I prescribed Zithromax. Her appetite is fair and her weight today has increased 5 pounds since her last visit.  She denies fever, chills, night sweats, or other signs of infection. She denies cardiorespiratory and gastrointestinal issues.  She denies any other pain.     SOCIAL HISTORY She lives with  her first daughter, who is present at today's visit. She has 2 daughters. She smokes 1.5 packs a day, and has been smoking since 70 yo. She is not sure about quitting. She did smoke 2 packs per day for over 50 years. She doesn't drink other than rare occasion. She is widowed. She has been married 4 times. She is disabled.   Menstrual History 71 years old when she had her first child She has had 3 children 4 years old when starting menstrual period Menopause started at 40 She has had a partial hysterectomy in her 46's and went back for complete hysterectomy/BSO later. No hormone replacement therapy   FAMILY HISTORY Daughter had breast cancer at age 69 Aunt had breast cancer at age 62 Her sister had a brain tumor at age 68     SURGICAL HISTORY She had a partial Hysterectomy in her late 56's, and went back a few years later to have BSO. Tonsillectomy cataract surgery  Stents of the iliac and femoral arteries. Nerve repair of the left upper extremity   PAST MEDICAL HISTORY Osteoporosis Coronary artery disease COPD Diabetes  Migraine headaches Hyperlipidemia Hypertension TIA Anxiety GERD Peripheral vascular disease Hard of hearing       REVIEW OF SYSTEMS:  Review of Systems  Constitutional: Negative.   HENT:  Negative.    Eyes: Negative.   Respiratory: Negative.    Gastrointestinal: Negative.   Endocrine: Negative.   Musculoskeletal: Negative.   Skin: Negative.   Neurological: Negative.        There is a question of possible early dementia since she has memory impairment  Hematological: Negative.   Psychiatric/Behavioral: Negative.      VITALS:  Blood pressure (!) 112/55, pulse (!) 119, temperature 97.8 F (36.6 C), temperature source Oral, resp. rate 20, height 5' 1" (1.549 m), weight 137 lb 1.6 oz (62.2 kg), SpO2 96 %.     Wt Readings from Last 3 Encounters:  06/24/22 140 lb (63.5 kg)  06/22/22 132 lb 1.3 oz (59.9 kg)  06/18/22 137 lb 1.6 oz (62.2 kg)     Body mass index is 25.9 kg/m.   Performance status (ECOG): 0 - Asymptomatic   PHYSICAL EXAM:  Physical Exam Constitutional:      General: She is not in acute distress.    Appearance: Normal appearance. She is normal weight. She is not ill-appearing.  HENT:     Right Ear: Tympanic membrane normal. There is no impacted cerumen.     Left Ear: Tympanic membrane normal. There is no impacted cerumen.     Nose: Nose normal.     Mouth/Throat:     Mouth: Mucous membranes are moist.     Pharynx: Oropharynx is clear.  Eyes:     General: No scleral icterus.  Extraocular Movements: Extraocular movements intact.     Pupils: Pupils are equal, round, and reactive to light.  Cardiovascular:     Rate and Rhythm: Normal rate and regular rhythm.     Pulses: Normal pulses.     Heart sounds: Normal heart sounds. No murmur heard.    No gallop.  Pulmonary:     Effort: Pulmonary effort is normal. No respiratory distress.     Breath sounds: Normal breath sounds. No wheezing or rales.  Chest:     Comments: She has several scars around the nipple areolar complex from the dye injections. She has a healing scar in the superior left breast. Healing left scar of the left axilla. No masses in either breast. Abdominal:     General: Bowel sounds are normal. There is no distension.     Palpations: There is no mass.     Tenderness: There is no abdominal tenderness.  Musculoskeletal:        General: Normal range of motion.     Cervical back: Normal range of motion and neck supple.     Right lower leg: Edema present.     Left lower leg: Edema present.     Comments: Trace Edema on her lower extremities.   Lymphadenopathy:     Cervical: No cervical adenopathy.  Skin:    Comments: She has some small red, raised nodules on her right foot which are tender with palpation.   Neurological:     Mental Status: She is alert and oriented to person, place, and time.  Psychiatric:        Mood and Affect: Mood normal.         Thought Content: Thought content normal.        Judgment: Judgment normal.       LABS:        Latest Ref Rng & Units 06/18/2022   12:00 AM 05/26/2022   12:00 AM 05/18/2022   12:00 AM  CBC  WBC   11.5     19.1     33.5      Hemoglobin 12.0 - 16.0 12.5     12.2     14.9      Hematocrit 36 - 46 38     37     45      Platelets 150 - 400 K/uL 234     238     247         This result is from an external source.        Latest Ref Rng & Units 06/18/2022   12:00 AM 05/26/2022   12:00 AM 05/18/2022   12:00 AM  CMP  BUN 4 - _0 37      Creatinine 0.5 - 1.1 0.8     1.0     1.3      Sodium 137 - 147 133     136     138      Potassium 3.5 - 5.1 mEq/L 3.3     3.6     3.0      Chloride 99 - 108 98     104     103      CO2 13 - _1 Calcium 8.7 - 10.7 9.0     8.6     9.0  Total Protein 6.3 - 8.2 g/dL   6        Alkaline Phos 25 - 125 105     71     120      AST 13 - 35 _0 ALT 7 - 35 U/L _1 This result is from an external source.        Recent Labs  No results found for: "CEA1", "CEA"   /  Last Labs  No results found for: "CEA1", "CEA"   Recent Labs  No results found for: "PSA1"   Recent Labs  No results found for: "OJZ530"   Recent Labs  No results found for: "CAN125"    Recent Labs  No results found for: "TOTALPROTELP", "ALBUMINELP", "A1GS", "A2GS", "BETS", "BETA2SER", "GAMS", "MSPIKE", "SPEI"   Recent Labs  No results found for: "TIBC", "FERRITIN", "IRONPCTSAT"   Recent Labs  No results found for: "LDH"       STUDIES:  EXAM:05/05/22 NUCLEAR MEDICINE WHOLE BODY BONE SCAN IMPRESSION: Uptake at LEFT tibial metaphysis especially medially, nonspecific; radiographic correlation recommended to exclude metastatic focus.   Focal abnormal tracer uptake at the LEFT temporo-occipital calvarium, corresponding to and focus of abnormal marrow signal on prior MR head exams back to 2010,  nonspecific but likely benign.   No additional scintigraphic findings to suggest osseous metastatic disease.   Diffuse calvarial uptake, favoring metabolic bone disease.    EXAM:05/05/22 CT CHEST WITH CONTRAST IMPRESSION: Ground glass nodule in the right upper lobe with a possible developing internal solid component. Lesion has shown indolent growth from 11/11/2017, at which time it measured approcimately 1.2 x 1.8 cm. Findings are worrisome for adenocarcinoma.  No definitive evidence of metastatic breast cancer. Steatotic cirrhotic liver   EXAM:03/18/2022 REPORT SURGICAL PATHOLOGY DIAGNOSIS Breast, lumpectomy, Left UOQ Invasive ductal carcinoma, grade 3, and ductal carcinoma in situ Ribbon clip and biopsy site present All margins negative for invasive carcinoma and DCIS Fibrocystic changes Lymph node, sentinel, biopsy One lymph node negative for tumor (0/1)   Estrogen receptor: 0% Progesterone receptor: 0% HER2 negative at 1+ Proliferation marker Ki-67: 40%   Bone density scan 01/27/2022 1.  Osteoporosis of the spine with a T score of -2.5, worse 2.  Left femur - osteoporosis with a T score of -2.7, slightly better 3.  Dual femur total mean-osteoporosis with a T score of -2.5, worse     Allergies:       Allergies  Allergen Reactions   Amoxicillin Hives      vomiting   Celecoxib Hives   Claritin [Loratadine] Other (See Comments)      Pt's daughter notified us of this allergy.   Insulins Nausea And Vomiting      Patient unsure of name of insulin, will call us back   Vioxx [Rofecoxib] Hives   Liraglutide Nausea And Vomiting   Propranolol Nausea And Vomiting      diarrhea diarrhea diarrhea        Current Medications:       Current Outpatient Medications  Medication Sig Dispense Refill   aspirin EC 81 MG tablet Take 81 mg by mouth daily.       atenolol (TENORMIN) 50 MG tablet Take 75 mg by mouth 2 (two) times daily.        busPIRone (BUSPAR) 7.5 MG tablet  Take 1  tablet by mouth in the morning, at noon, and at bedtime.       dexamethasone (DECADRON) 4 MG tablet Take 2 tabs by mouth 2 times daily starting day before chemo. Then take 2 tabs daily for 2 days starting day after chemo. Take with food. 30 tablet 1   DULoxetine (CYMBALTA) 60 MG capsule Take 120 mg by mouth at bedtime.        famotidine (PEPCID) 20 MG tablet Take 20 mg by mouth 2 (two) times daily.       fenofibrate (TRICOR) 145 MG tablet Take 145 mg by mouth daily.       fexofenadine (ALLEGRA) 180 MG tablet Take 180 mg by mouth daily.       gabapentin (NEURONTIN) 600 MG tablet Take 600 mg by mouth 3 (three) times daily.       hydrALAZINE (APRESOLINE) 50 MG tablet Take 50 mg by mouth 3 (three) times daily.       lidocaine-prilocaine (EMLA) cream Apply to affected area once 30 g 3   LORazepam (ATIVAN) 0.5 MG tablet Take 1 tablet (0.5 mg total) by mouth at bedtime as needed for sleep. May take one tab prior to imaging studies due to anxiety 30 tablet 0   Melatonin 1 MG CAPS Take by mouth. (Patient not taking: Reported on 04/30/2022)       metoprolol tartrate (LOPRESSOR) 25 MG tablet Take 25 mg by mouth 2 (two) times daily.       nicotine (NICODERM CQ - DOSED IN MG/24 HOURS) 21 mg/24hr patch Place 1 patch (21 mg total) onto the skin daily. 28 patch 0   nitroGLYCERIN (NITROSTAT) 0.4 MG SL tablet Place 0.4 mg under the tongue every 5 (five) minutes as needed. For chest pain       ondansetron (ZOFRAN-ODT) 4 MG disintegrating tablet Take 4 mg by mouth every 6 (six) hours as needed.       oxyCODONE (OXY IR/ROXICODONE) 5 MG immediate release tablet Take 5 mg by mouth every 6 (six) hours as needed.       pantoprazole (PROTONIX) 40 MG tablet Take 40 mg by mouth 2 (two) times daily.       potassium chloride (KLOR-CON M) 10 MEQ tablet Take 1 tablet (10 mEq total) by mouth daily. 30 tablet 5   prochlorperazine (COMPAZINE) 10 MG tablet Take 1 tablet (10 mg total) by mouth every 6 (six) hours as needed for nausea  or vomiting. 30 tablet 1   tirzepatide (MOUNJARO) 2.5 MG/0.5ML Pen INJECT 2.5 MILLIGRAM EVERY WEEK       TRESIBA FLEXTOUCH 100 UNIT/ML FlexTouch Pen Inject into the skin.       valsartan-hydrochlorothiazide (DIOVAN-HCT) 320-25 MG tablet TAKE 1 TABLET BY ORAL ROUTE ONCE DAILY FOR HIGH BLOOD PRESSURE       zolpidem (AMBIEN) 10 MG tablet Take 10 mg by mouth at bedtime as needed for sleep.  (Patient not taking: Reported on 04/30/2022)        No current facility-administered medications for this visit.      I,Gabriella Ballesteros,acting as a scribe for Derwood Kaplan, MD.,have documented all relevant documentation on the behalf of Derwood Kaplan, MD,as directed by  Derwood Kaplan, MD while in the presence of Derwood Kaplan, MD.

## 2022-07-10 ENCOUNTER — Inpatient Hospital Stay: Payer: Medicare Other | Attending: Oncology

## 2022-07-10 ENCOUNTER — Other Ambulatory Visit: Payer: Self-pay | Admitting: Hematology and Oncology

## 2022-07-10 ENCOUNTER — Other Ambulatory Visit: Payer: Self-pay

## 2022-07-10 VITALS — BP 112/63 | HR 114 | Temp 97.9°F | Resp 18

## 2022-07-10 DIAGNOSIS — Z5189 Encounter for other specified aftercare: Secondary | ICD-10-CM | POA: Diagnosis not present

## 2022-07-10 DIAGNOSIS — R7989 Other specified abnormal findings of blood chemistry: Secondary | ICD-10-CM

## 2022-07-10 DIAGNOSIS — C50412 Malignant neoplasm of upper-outer quadrant of left female breast: Secondary | ICD-10-CM | POA: Insufficient documentation

## 2022-07-10 DIAGNOSIS — E1169 Type 2 diabetes mellitus with other specified complication: Secondary | ICD-10-CM | POA: Diagnosis not present

## 2022-07-10 DIAGNOSIS — Z5111 Encounter for antineoplastic chemotherapy: Secondary | ICD-10-CM | POA: Insufficient documentation

## 2022-07-10 DIAGNOSIS — Z171 Estrogen receptor negative status [ER-]: Secondary | ICD-10-CM | POA: Diagnosis not present

## 2022-07-10 DIAGNOSIS — E876 Hypokalemia: Secondary | ICD-10-CM

## 2022-07-10 DIAGNOSIS — E860 Dehydration: Secondary | ICD-10-CM | POA: Insufficient documentation

## 2022-07-10 DIAGNOSIS — I10 Essential (primary) hypertension: Secondary | ICD-10-CM | POA: Diagnosis not present

## 2022-07-10 MED ORDER — SODIUM CHLORIDE 0.9 % IV SOLN: Freq: Once | INTRAVENOUS | Status: AC

## 2022-07-10 MED ORDER — HEPARIN SOD (PORK) LOCK FLUSH 100 UNIT/ML IV SOLN
250.0000 [IU] | Freq: Once | INTRAVENOUS | Status: AC | PRN
  Administered 2022-07-10: 500 [IU]

## 2022-07-10 MED ORDER — SODIUM CHLORIDE 0.9% FLUSH
10.0000 mL | Freq: Once | INTRAVENOUS | Status: AC | PRN
  Administered 2022-07-10: 10 mL

## 2022-07-10 MED FILL — Cyclophosphamide For Inj 1 GM: INTRAMUSCULAR | Qty: 40 | Status: AC

## 2022-07-10 MED FILL — Dexamethasone Sodium Phosphate Inj 100 MG/10ML: INTRAMUSCULAR | Qty: 1 | Status: AC

## 2022-07-13 ENCOUNTER — Inpatient Hospital Stay: Payer: Medicare Other

## 2022-07-13 ENCOUNTER — Other Ambulatory Visit: Payer: Self-pay | Admitting: Oncology

## 2022-07-13 ENCOUNTER — Telehealth: Payer: Self-pay

## 2022-07-13 ENCOUNTER — Other Ambulatory Visit: Payer: Self-pay

## 2022-07-13 VITALS — BP 145/67 | HR 120 | Temp 98.4°F | Resp 18 | Ht 61.0 in | Wt 143.0 lb

## 2022-07-13 DIAGNOSIS — M79605 Pain in left leg: Secondary | ICD-10-CM | POA: Diagnosis not present

## 2022-07-13 DIAGNOSIS — R600 Localized edema: Secondary | ICD-10-CM

## 2022-07-13 DIAGNOSIS — Z5111 Encounter for antineoplastic chemotherapy: Secondary | ICD-10-CM | POA: Diagnosis not present

## 2022-07-13 DIAGNOSIS — M7989 Other specified soft tissue disorders: Secondary | ICD-10-CM | POA: Diagnosis not present

## 2022-07-13 DIAGNOSIS — C50412 Malignant neoplasm of upper-outer quadrant of left female breast: Secondary | ICD-10-CM

## 2022-07-13 MED ORDER — HEPARIN SOD (PORK) LOCK FLUSH 100 UNIT/ML IV SOLN
500.0000 [IU] | Freq: Once | INTRAVENOUS | Status: AC | PRN
  Administered 2022-07-13: 500 [IU]

## 2022-07-13 MED ORDER — SODIUM CHLORIDE 0.9 % IV SOLN
50.0000 mg/m2 | Freq: Once | INTRAVENOUS | Status: AC
  Administered 2022-07-13: 80 mg via INTRAVENOUS
  Filled 2022-07-13: qty 8

## 2022-07-13 MED ORDER — SODIUM CHLORIDE 0.9 % IV SOLN: Freq: Once | INTRAVENOUS | Status: AC

## 2022-07-13 MED ORDER — SODIUM CHLORIDE 0.9 % IV SOLN
10.0000 mg | Freq: Once | INTRAVENOUS | Status: AC
  Administered 2022-07-13: 10 mg via INTRAVENOUS
  Filled 2022-07-13: qty 10

## 2022-07-13 MED ORDER — SODIUM CHLORIDE 0.9% FLUSH
10.0000 mL | INTRAVENOUS | Status: DC | PRN
  Administered 2022-07-13: 10 mL

## 2022-07-13 MED ORDER — SODIUM CHLORIDE 0.9 % IV SOLN
480.0000 mg/m2 | Freq: Once | INTRAVENOUS | Status: AC
  Administered 2022-07-13: 800 mg via INTRAVENOUS
  Filled 2022-07-13: qty 40

## 2022-07-13 MED ORDER — PALONOSETRON HCL INJECTION 0.25 MG/5ML
0.2500 mg | Freq: Once | INTRAVENOUS | Status: AC
  Administered 2022-07-13: 0.25 mg via INTRAVENOUS
  Filled 2022-07-13: qty 5

## 2022-07-13 NOTE — Patient Instructions (Signed)
[facility]  Discharge Instructions: Thank you for choosing [facility] to provide your oncology and hematology care.  If you have a lab appointment with the [facility], please go directly to the [facility] and check in at the registration area.   Wear comfortable clothing and clothing appropriate for easy access to any Portacath or PICC line.   We strive to give you quality time with your provider. You may need to reschedule your appointment if you arrive late (15 or more minutes).  Arriving late affects you and other patients whose appointments are after yours.  Also, if you miss three or more appointments without notifying the office, you may be dismissed from the clinic at the provider's discretion.      For prescription refill requests, have your pharmacy contact our office and allow 72 hours for refills to be completed.    Today you received the following chemotherapy and/or immunotherapy agents    To help prevent nausea and vomiting after your treatment, we encourage you to take your nausea medication as directed.  BELOW ARE SYMPTOMS THAT SHOULD BE REPORTED IMMEDIATELY: *FEVER GREATER THAN 100.4 F (38 C) OR HIGHER *CHILLS OR SWEATING *NAUSEA AND VOMITING THAT IS NOT CONTROLLED WITH YOUR NAUSEA MEDICATION *UNUSUAL SHORTNESS OF BREATH *UNUSUAL BRUISING OR BLEEDING *URINARY PROBLEMS (pain or burning when urinating, or frequent urination) *BOWEL PROBLEMS (unusual diarrhea, constipation, pain near the anus) TENDERNESS IN MOUTH AND THROAT WITH OR WITHOUT PRESENCE OF ULCERS (sore throat, sores in mouth, or a toothache) UNUSUAL RASH, SWELLING OR PAIN  UNUSUAL VAGINAL DISCHARGE OR ITCHING   Items with * indicate a potential emergency and should be followed up as soon as possible or go to the Emergency Department if any problems should occur.  Please show the CHEMOTHERAPY ALERT CARD or IMMUNOTHERAPY ALERT CARD at check-in to the Emergency  Department and triage nurse.  Should you have questions after your visit or need to cancel or reschedule your appointment, please contact [facility]  Dept: [phone]  and follow the prompts.  Office hours are 8:00 a.m. to 4:30 p.m. Monday - Friday. Please note that voicemails left after 4:00 p.m. may not be returned until the following business day.  We are closed weekends and major holidays. You have access to a nurse at all times for urgent questions. Please call the main number to the clinic Dept: [phone] and follow the prompts.  For any non-urgent questions, you may also contact your provider using MyChart. We now offer e-Visits for anyone 7 and older to request care online for non-urgent symptoms. For details visit [email or web address].   Also download the MyChart app! Go to the app store, search "MyChart", open the app, select [facility], and log in with your MyChart username and password.  Masks are optional in the cancer centers. If you would like for your care team to wear a mask while they are taking care of you, please let them know. You may have one support person who is at least 71 years old accompany you for your appointments. Cyclophosphamide Injection What is this medication? CYCLOPHOSPHAMIDE (sye kloe FOSS fa mide) treats some types of cancer. It works by slowing down the growth of cancer cells. This medicine may be used for other purposes; ask your health care provider or pharmacist if you have questions. COMMON BRAND NAME(S): Cyclophosphamide, Cytoxan, Neosar What should I tell my care team before I take this medication? They need to  know if you have any of these conditions: Heart disease Irregular heartbeat or rhythm Infection Kidney problems Liver disease Low blood cell levels (white cells, platelets, or red blood cells) Lung disease Previous radiation Trouble passing urine An unusual or allergic reaction to cyclophosphamide, other  medications, foods, dyes, or preservatives Pregnant or trying to get pregnant Breast-feeding How should I use this medication? This medication is injected into a vein. It is given by your care team in a hospital or clinic setting. Talk to your care team about the use of this medication in children. Special care may be needed. Overdosage: If you think you have taken too much of this medicine contact a poison control center or emergency room at once. NOTE: This medicine is only for you. Do not share this medicine with others. What if I miss a dose? Keep appointments for follow-up doses. It is important not to miss your dose. Call your care team if you are unable to keep an appointment. What may interact with this medication? Amphotericin B Amiodarone Azathioprine Certain antivirals for HIV or hepatitis Certain medications for blood pressure, such as enalapril, lisinopril, quinapril Cyclosporine Diuretics Etanercept Indomethacin Medications that relax muscles Metronidazole Natalizumab Tamoxifen Warfarin This list may not describe all possible interactions. Give your health care provider a list of all the medicines, herbs, non-prescription drugs, or dietary supplements you use. Also tell them if you smoke, drink alcohol, or use illegal drugs. Some items may interact with your medicine. What should I watch for while using this medication? This medication may make you feel generally unwell. This is not uncommon as chemotherapy can affect healthy cells as well as cancer cells. Report any side effects. Continue your course of treatment even though you feel ill unless your care team tells you to stop. You may need blood work while you are taking this medication. This medication may increase your risk of getting an infection. Call your care team for advice if you get a fever, chills, sore throat, or other symptoms of a cold or flu. Do not treat yourself. Try to avoid being around people who are  sick. Avoid taking medications that contain aspirin, acetaminophen, ibuprofen, naproxen, or ketoprofen unless instructed by your care team. These medications may hide a fever. Be careful brushing or flossing your teeth or using a toothpick because you may get an infection or bleed more easily. If you have any dental work done, tell your dentist you are receiving this medication. Drink water or other fluids as directed. Urinate often, even at night. Some products may contain alcohol. Ask your care team if this medication contains alcohol. Be sure to tell all care teams you are taking this medicine. Certain medicines, like metronidazole and disulfiram, can cause an unpleasant reaction when taken with alcohol. The reaction includes flushing, headache, nausea, vomiting, sweating, and increased thirst. The reaction can last from 30 minutes to several hours. Talk to your care team if you wish to become pregnant or think you might be pregnant. This medication can cause serious birth defects if taken during pregnancy and for 1 year after the last dose. A negative pregnancy test is required before starting this medication. A reliable form of contraception is recommended while taking this medication and for 1 year after the last dose. Talk to your care team about reliable forms of contraception. Do not father a child while taking this medication and for 4 months after the last dose. Use a condom during this time period. Do not breast-feed while  taking this medication or for 1 week after the last dose. This medication may cause infertility. Talk to your care team if you are concerned about your fertility. Talk to your care team about your risk of cancer. You may be more at risk for certain types of cancer if you take this medication. What side effects may I notice from receiving this medication? Side effects that you should report to your care team as soon as possible: Allergic reactions--skin rash, itching, hives,  swelling of the face, lips, tongue, or throat Dry cough, shortness of breath or trouble breathing Heart failure--shortness of breath, swelling of the ankles, feet, or hands, sudden weight gain, unusual weakness or fatigue Heart muscle inflammation--unusual weakness or fatigue, shortness of breath, chest pain, fast or irregular heartbeat, dizziness, swelling of the ankles, feet, or hands Heart rhythm changes--fast or irregular heartbeat, dizziness, feeling faint or lightheaded, chest pain, trouble breathing Infection--fever, chills, cough, sore throat, wounds that don't heal, pain or trouble when passing urine, general feeling of discomfort or being unwell Kidney injury--decrease in the amount of urine, swelling of the ankles, hands, or feet Liver injury--right upper belly pain, loss of appetite, nausea, light-colored stool, dark yellow or brown urine, yellowing skin or eyes, unusual weakness or fatigue Low red blood cell level--unusual weakness or fatigue, dizziness, headache, trouble breathing Low sodium level--muscle weakness, fatigue, dizziness, headache, confusion Red or dark brown urine Unusual bruising or bleeding Side effects that usually do not require medical attention (report to your care team if they continue or are bothersome): Hair loss Irregular menstrual cycles or spotting Loss of appetite Nausea Pain, redness, or swelling with sores inside the mouth or throat Vomiting This list may not describe all possible side effects. Call your doctor for medical advice about side effects. You may report side effects to FDA at 1-800-FDA-1088. Where should I keep my medication? This medication is given in a hospital or clinic. It will not be stored at home. NOTE: This sheet is a summary. It may not cover all possible information. If you have questions about this medicine, talk to your doctor, pharmacist, or health care provider.  2023 Elsevier/Gold Standard (2021-09-16 00:00:00) Docetaxel  Injection What is this medication? DOCETAXEL (doe se TAX el) treats some types of cancer. It works by slowing down the growth of cancer cells. This medicine may be used for other purposes; ask your health care provider or pharmacist if you have questions. COMMON BRAND NAME(S): Docefrez, Taxotere What should I tell my care team before I take this medication? They need to know if you have any of these conditions: Kidney disease Liver disease Low white blood cell levels Tingling of the fingers or toes or other nerve disorder An unusual or allergic reaction to docetaxel, polysorbate 80, other medications, foods, dyes, or preservatives Pregnant or trying to get pregnant Breast-feeding How should I use this medication? This medication is injected into a vein. It is given by your care team in a hospital or clinic setting. Talk to your care team about the use of this medication in children. Special care may be needed. Overdosage: If you think you have taken too much of this medicine contact a poison control center or emergency room at once. NOTE: This medicine is only for you. Do not share this medicine with others. What if I miss a dose? Keep appointments for follow-up doses. It is important not to miss your dose. Call your care team if you are unable to keep an appointment. What may  interact with this medication? Do not take this medication with any of the following: Live virus vaccines This medication may also interact with the following: Certain antibiotics, such as clarithromycin, telithromycin Certain antivirals for HIV or hepatitis Certain medications for fungal infections, such as itraconazole, ketoconazole, voriconazole Grapefruit juice Nefazodone Supplements, such as St. John's wort This list may not describe all possible interactions. Give your health care provider a list of all the medicines, herbs, non-prescription drugs, or dietary supplements you use. Also tell them if you  smoke, drink alcohol, or use illegal drugs. Some items may interact with your medicine. What should I watch for while using this medication? This medication may make you feel generally unwell. This is not uncommon as chemotherapy can affect healthy cells as well as cancer cells. Report any side effects. Continue your course of treatment even though you feel ill unless your care team tells you to stop. You may need blood work done while you are taking this medication. This medication can cause serious side effects and infusion reactions. To reduce the risk, your care team may give you other medications to take before receiving this one. Be sure to follow the directions from your care team. This medication may increase your risk of getting an infection. Call your care team for advice if you get a fever, chills, sore throat, or other symptoms of a cold or flu. Do not treat yourself. Try to avoid being around people who are sick. Avoid taking medications that contain aspirin, acetaminophen, ibuprofen, naproxen, or ketoprofen unless instructed by your care team. These medications may hide a fever. Be careful brushing or flossing your teeth or using a toothpick because you may get an infection or bleed more easily. If you have any dental work done, tell your dentist you are receiving this medication. Some products may contain alcohol. Ask your care team if this medication contains alcohol. Be sure to tell all care teams you are taking this medicine. Certain medications, like metronidazole and disulfiram, can cause an unpleasant reaction when taken with alcohol. The reaction includes flushing, headache, nausea, vomiting, sweating, and increased thirst. The reaction can last from 30 minutes to several hours. This medication may affect your coordination, reaction time, or judgement. Do not drive or operate machinery until you know how this medication affects you. Sit up or stand slowly to reduce the risk of dizzy or  fainting spells. Drinking alcohol with this medication can increase the risk of these side effects. Talk to your care team about your risk of cancer. You may be more at risk for certain types of cancer if you take this medication. Talk to your care team if you wish to become pregnant or think you might be pregnant. This medication can cause serious birth defects if taken during pregnancy or if you get pregnant within 2 months after stopping therapy. A negative pregnancy test is required before starting this medication. A reliable form of contraception is recommended while taking this medication and for 2 months after stopping it. Talk to your care team about reliable forms of contraception. Do not breast-feed while taking this medication and for 1 week after stopping therapy. Use a condom during sex and for 4 months after stopping therapy. Tell your care team right away if you think your partner might be pregnant. This medication can cause serious birth defects. This medication may cause infertility. Talk to your care team if you are concerned about your fertility. What side effects may I notice from receiving  this medication? Side effects that you should report to your care team as soon as possible: Allergic reactions--skin rash, itching, hives, swelling of the face, lips, tongue, or throat Change in vision such as blurry vision, seeing halos around lights, vision loss Infection--fever, chills, cough, or sore throat Infusion reactions--chest pain, shortness of breath or trouble breathing, feeling faint or lightheaded Low red blood cell level--unusual weakness or fatigue, dizziness, headache, trouble breathing Pain, tingling, or numbness in the hands or feet Painful swelling, warmth, or redness of the skin, blisters or sores at the infusion site Redness, blistering, peeling, or loosening of the skin, including inside the mouth Sudden or severe stomach pain, bloody diarrhea, fever, nausea,  vomiting Swelling of the ankles, hands, or feet Tumor lysis syndrome (TLS)--nausea, vomiting, diarrhea, decrease in the amount of urine, dark urine, unusual weakness or fatigue, confusion, muscle pain or cramps, fast or irregular heartbeat, joint pain Unusual bruising or bleeding Side effects that usually do not require medical attention (report to your care team if they continue or are bothersome): Change in nail shape, thickness, or color Change in taste Hair loss Increased tears This list may not describe all possible side effects. Call your doctor for medical advice about side effects. You may report side effects to FDA at 1-800-FDA-1088. Where should I keep my medication? This medication is given in a hospital or clinic. It will not be stored at home. NOTE: This sheet is a summary. It may not cover all possible information. If you have questions about this medicine, talk to your doctor, pharmacist, or health care provider.  2023 Elsevier/Gold Standard (2007-09-17 00:00:00)

## 2022-07-13 NOTE — Telephone Encounter (Signed)
Patients daughter Roanna Epley notified of Ultrasound results. Ultrasound negative for blood clots per Dr. Hinton Rao

## 2022-07-14 ENCOUNTER — Inpatient Hospital Stay: Payer: Medicare Other

## 2022-07-14 VITALS — BP 136/58 | HR 120 | Temp 97.9°F | Resp 18 | Ht 61.0 in | Wt 143.0 lb

## 2022-07-14 DIAGNOSIS — E1169 Type 2 diabetes mellitus with other specified complication: Secondary | ICD-10-CM | POA: Diagnosis not present

## 2022-07-14 DIAGNOSIS — F17210 Nicotine dependence, cigarettes, uncomplicated: Secondary | ICD-10-CM | POA: Diagnosis not present

## 2022-07-14 DIAGNOSIS — N1831 Chronic kidney disease, stage 3a: Secondary | ICD-10-CM | POA: Diagnosis not present

## 2022-07-14 DIAGNOSIS — Z0189 Encounter for other specified special examinations: Secondary | ICD-10-CM | POA: Diagnosis not present

## 2022-07-14 DIAGNOSIS — C50412 Malignant neoplasm of upper-outer quadrant of left female breast: Secondary | ICD-10-CM

## 2022-07-14 DIAGNOSIS — I10 Essential (primary) hypertension: Secondary | ICD-10-CM | POA: Diagnosis not present

## 2022-07-14 DIAGNOSIS — Z720 Tobacco use: Secondary | ICD-10-CM | POA: Diagnosis not present

## 2022-07-14 DIAGNOSIS — Z139 Encounter for screening, unspecified: Secondary | ICD-10-CM | POA: Diagnosis not present

## 2022-07-14 DIAGNOSIS — Z0000 Encounter for general adult medical examination without abnormal findings: Secondary | ICD-10-CM | POA: Diagnosis not present

## 2022-07-14 DIAGNOSIS — Z5111 Encounter for antineoplastic chemotherapy: Secondary | ICD-10-CM | POA: Diagnosis not present

## 2022-07-14 DIAGNOSIS — E785 Hyperlipidemia, unspecified: Secondary | ICD-10-CM | POA: Diagnosis not present

## 2022-07-14 MED ORDER — PEGFILGRASTIM-CBQV 6 MG/0.6ML ~~LOC~~ SOSY
6.0000 mg | PREFILLED_SYRINGE | Freq: Once | SUBCUTANEOUS | Status: AC
  Administered 2022-07-14: 6 mg via SUBCUTANEOUS
  Filled 2022-07-14: qty 0.6

## 2022-07-14 NOTE — Patient Instructions (Signed)

## 2022-07-15 ENCOUNTER — Inpatient Hospital Stay: Payer: Medicare Other

## 2022-07-17 ENCOUNTER — Other Ambulatory Visit: Payer: Self-pay | Admitting: Oncology

## 2022-07-21 DIAGNOSIS — R0603 Acute respiratory distress: Secondary | ICD-10-CM | POA: Diagnosis not present

## 2022-07-21 DIAGNOSIS — U071 COVID-19: Secondary | ICD-10-CM | POA: Diagnosis not present

## 2022-07-23 ENCOUNTER — Encounter: Payer: Self-pay | Admitting: Oncology

## 2022-07-28 DIAGNOSIS — G620 Drug-induced polyneuropathy: Secondary | ICD-10-CM | POA: Diagnosis not present

## 2022-07-28 DIAGNOSIS — C50412 Malignant neoplasm of upper-outer quadrant of left female breast: Secondary | ICD-10-CM | POA: Diagnosis not present

## 2022-08-04 NOTE — Progress Notes (Signed)
Expand All Collapse All  Lindsay Brooks  [address] [place],  [place]  [address] [phone]   Clinic Day: 07/09/22    Referring physician: Marco Collie, MD     ASSESSMENT & PLAN:  Stage IA Left breast cancer This is a grade 3 triple negative 21 mm tumor for a T1cN0M0 with a Ki-67 of 40%. She has had resection with clear margins and negative sentinel node. However, I recommended adjuvant chemotherapy with docetaxol and cytoxan every 3 weeks for 4 cycles to avoid anthracyclines.     Hypokalemia Her potassium had dropped so I gave IV supplement and started her on 20 meq oral daily.   Leukocytosis This is from her Neulasta  Her white count is 12,600 today, much lower.   Neuropathy She complains of numbness and pain of both legs that she rates as a 9/10. She also complains of back pain, which could be related to the Neulasta. Neuropathy of her feet is considerably worse and I will reduce her chemotherapy dose by another 15%.   Family history of breast cancer and BRCA positivity Yet she is BRCA negative.   Lung nodule This is a ground glass nodule of the right upper lobe which has shown indolent growth from 1.8 cm in April of 2019 to 2.2 cm currently. We will plan to monitor this with repeat imaging periodically.   Liver cirrhosis Liver is decreased in attenuation diffusely with mildly irregular margins. I don't think she was aware of this diagnosis but it may explain her increased toxicities.   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   Memory impairment This is consistent with early dementia. She also has significant hearing impairment.  ACUTE SINUSITIS She describes yellow drainage with blood. I will prescribe Zithromax.    Plan: She will receive her fourth and final cycle of chemotherapy 07/13/22 I will get her sinusitis treated now and she  will receive 1 liter of NS tomorrow. We will see her back in 1 month with repeat CBC and CMP for her. We will then discuss referral for radiation.  I discussed the assessment and treatment plan with the patient and her family.  The patient was provided an opportunity to ask questions and all were answered.  The patient agreed with the plan and demonstrated an understanding of the instructions.  The patient was advised to call back if the symptoms worsen or if the condition fails to improve as anticipated.  Her worsening neuropathy and degenerative disease of the spine have caused her severe inability to ambulate and she requires a wheelchair. Her mobility limitation cannot be sufficiently resolved with the use of a cane or walker. The use of a wheelchair will significantly improve her ability to participate in activities of daily living and she will require the use on a regular basis in the home.     I provided 20 minutes of face-to-face time during this this encounter and > 50% was spent counseling as documented under my assessment and plan.    Derwood Kaplan, MD [place] [address] [place] [address] Dept: [phone] Dept Fax: [phone]        CHIEF COMPLAINT:  CC: Invasive ductal carcinoma   Current Treatment:  chemotherapy     HISTORY OF PRESENT ILLNESS:       Oncology History  Breast cancer of  upper-outer quadrant of left female breast ([facility])  03/18/2022 Initial Diagnosis    Breast cancer of upper-outer quadrant of left female breast ([facility])    04/28/2022 Cancer Staging    Staging form: Breast, AJCC 8th Edition - Clinical stage from 04/28/2022: Stage IB (cT1c, cN0(sn), cM0, G3, ER-, PR-, HER2-) - Signed by Derwood Kaplan, MD on 04/28/2022 Histopathologic type: Infiltrating duct carcinoma, NOS Stage prefix: Initial diagnosis Method of lymph node assessment: Sentinel lymph node biopsy Nuclear  grade: G3 Multigene prognostic tests performed: None Histologic grading system: 3 grade system Laterality: Left Tumor size (mm): 20 Lymph-vascular invasion (LVI): LVI not present (absent)/not identified Diagnostic confirmation: Positive histology Specimen type: Excision Staged by: Managing physician Menopausal status: Postmenopausal Ki-67 (%): 40 Stage used in treatment planning: Yes National guidelines used in treatment planning: Yes Type of national guideline used in treatment planning: NCCN    05/07/2022 -  Chemotherapy    Patient is on Treatment Plan : BREAST TC q21d     05/19/2022 Genetic Testing    Negative hereditary cancer genetic testing: no pathogenic variant detected in [facility] Common Hereditary Cancers +RNA Panel.  Report date is May 19, 2022.     The Common Hereditary Cancers + RNA Panel offered by [facility] includes sequencing, deletion/duplication, and RNA testing of the following 47 genes: APC, ATM, AXIN2, BARD1, BMPR1A, BRCA1, BRCA2, BRIP1, CDH1, CDK4*, CDKN2A (p14ARF)*, CDKN2A (p16INK4a)*, CHEK2, CTNNA1, DICER1, EPCAM (Deletion/duplication testing only), GREM1 (promoter region deletion/duplication testing only), KIT, MEN1, MLH1, MSH2, MSH3, MSH6, MUTYH, NBN, NF1, NHTL1, PALB2, PDGFRA*, PMS2, POLD1, POLE, PTEN, RAD50, RAD51C, RAD51D, SDHB, SDHC, SDHD, SMAD4, SMARCA4. STK11, TP53, TSC1, TSC2, and VHL.  The following genes were evaluated for sequence changes only: SDHA and HOXB13 c.251G>A variant only.  RNA analysis is not performed for the * genes.      Lindsay Brooks is a 71 year old woman who had a screening mammogram on 01/27/2022 and was found to have a possible left breast mass. She does get yearly mammograms due to her strong family history. She thought  she could feel a lump after this was found. She had a diagnostic mammogram on 02/11/2022 with a persistent spiculated hyperdense mass with associated calcifications in the upper inner quadrant. Ultrasound confirmed an irregular  hypoechoic mass with peripheral vascularity at 11:30, 3 cm from the nipple. This measures 2.2 cm and the left axilla was negative. She was referred to [name] after a biopsy was done on 02/17/2022, which confirmed a grade 3 invasive ductal carcinoma which is triple negative with a Ki-67 of 40%. [name] took her to surgery on 03/18/2022 for lumpectomy and sentinel lymph node. The final pathology revealed a 20 mm grade 3 invasive ductal carcinoma with DCIS. The margins were clear and the lymph node was negative for a T1cN0M0, stage IA. She has had a port placed.     INTERVAL HISTORY:  Lindsay Brooks is seen here today for invasive ductal carcinoma, after her third cycle of TC chemotherapy. She has trace edema of the lower extremities but her blood counts are not normal her hemoglobin is 10.4 and white count 12.6. She has tender nodules on the bottom of her feet that causes her pain with pressure and difficulty with ambulating. She has worsening neuropathy. A walker and cane are not sufficient for her mobility limitation. Use of a manual wheelchair will significantly improve the beneficiary's ability to participate in MRADLs and the beneficiary will use it on a regular basis. She does also suffer from COPD and is  experiencing shortness of breath when walking. However, putting arms over her head does trigger pseudoseizures. Patient is dehydrated and will receive 1 liter of NS on 11/31/23. She is scheduled for her next chemo appointment on 07/13/22. I prescribed hydrocodone 5 mg for neuropathy pain; she can take whole or half. She is already on Gabapentin and Cymbalta. She has a sinus infection with yellow drainage and I prescribed Zithromax. Her appetite is fair and her weight today has increased 5 pounds since her last visit.  She denies fever, chills, night sweats, or other signs of infection. She denies cardiorespiratory and gastrointestinal issues.  She denies any other pain.     SOCIAL HISTORY She lives with  her first daughter, who is present at today's visit. She has 2 daughters. She smokes 1.5 packs a day, and has been smoking since 70 yo. She is not sure about quitting. She did smoke 2 packs per day for over 50 years. She doesn't drink other than rare occasion. She is widowed. She has been married 4 times. She is disabled.   Menstrual History 71 years old when she had her first child She has had 3 children 4 years old when starting menstrual period Menopause started at 40 She has had a partial hysterectomy in her 46's and went back for complete hysterectomy/BSO later. No hormone replacement therapy   FAMILY HISTORY Daughter had breast cancer at age 69 Aunt had breast cancer at age 62 Her sister had a brain tumor at age 68     SURGICAL HISTORY She had a partial Hysterectomy in her late 56's, and went back a few years later to have BSO. Tonsillectomy cataract surgery  Stents of the iliac and femoral arteries. Nerve repair of the left upper extremity   PAST MEDICAL HISTORY Osteoporosis Coronary artery disease COPD Diabetes  Migraine headaches Hyperlipidemia Hypertension TIA Anxiety GERD Peripheral vascular disease Hard of hearing       REVIEW OF SYSTEMS:  Review of Systems  Constitutional: Negative.   HENT:  Negative.    Eyes: Negative.   Respiratory: Negative.    Gastrointestinal: Negative.   Endocrine: Negative.   Musculoskeletal: Negative.   Skin: Negative.   Neurological: Negative.        There is a question of possible early dementia since she has memory impairment  Hematological: Negative.   Psychiatric/Behavioral: Negative.      VITALS:  Blood pressure (!) 112/55, pulse (!) 119, temperature 97.8 F (36.6 C), temperature source Oral, resp. rate 20, height 5' 1" (1.549 m), weight 137 lb 1.6 oz (62.2 kg), SpO2 96 %.     Wt Readings from Last 3 Encounters:  06/24/22 140 lb (63.5 kg)  06/22/22 132 lb 1.3 oz (59.9 kg)  06/18/22 137 lb 1.6 oz (62.2 kg)     Body mass index is 25.9 kg/m.   Performance status (ECOG): 0 - Asymptomatic   PHYSICAL EXAM:  Physical Exam Constitutional:      General: She is not in acute distress.    Appearance: Normal appearance. She is normal weight. She is not ill-appearing.  HENT:     Right Ear: Tympanic membrane normal. There is no impacted cerumen.     Left Ear: Tympanic membrane normal. There is no impacted cerumen.     Nose: Nose normal.     Mouth/Throat:     Mouth: Mucous membranes are moist.     Pharynx: Oropharynx is clear.  Eyes:     General: No scleral icterus.  Extraocular Movements: Extraocular movements intact.     Pupils: Pupils are equal, round, and reactive to light.  Cardiovascular:     Rate and Rhythm: Normal rate and regular rhythm.     Pulses: Normal pulses.     Heart sounds: Normal heart sounds. No murmur heard.    No gallop.  Pulmonary:     Effort: Pulmonary effort is normal. No respiratory distress.     Breath sounds: Normal breath sounds. No wheezing or rales.  Chest:     Comments: She has several scars around the nipple areolar complex from the dye injections. She has a healing scar in the superior left breast. Healing left scar of the left axilla. No masses in either breast. Abdominal:     General: Bowel sounds are normal. There is no distension.     Palpations: There is no mass.     Tenderness: There is no abdominal tenderness.  Musculoskeletal:        General: Normal range of motion.     Cervical back: Normal range of motion and neck supple.     Right lower leg: Edema present.     Left lower leg: Edema present.     Comments: Trace Edema on her lower extremities.   Lymphadenopathy:     Cervical: No cervical adenopathy.  Skin:    Comments: She has some small red, raised nodules on her right foot which are tender with palpation.   Neurological:     Mental Status: She is alert and oriented to person, place, and time.  Psychiatric:        Mood and Affect: Mood normal.         Thought Content: Thought content normal.        Judgment: Judgment normal.       LABS:        Latest Ref Rng & Units 06/18/2022   12:00 AM 05/26/2022   12:00 AM 05/18/2022   12:00 AM  CBC  WBC   11.5     19.1     33.5      Hemoglobin 12.0 - 16.0 12.5     12.2     14.9      Hematocrit 36 - 46 38     37     45      Platelets 150 - 400 K/uL 234     238     247         This result is from an external source.        Latest Ref Rng & Units 06/18/2022   12:00 AM 05/26/2022   12:00 AM 05/18/2022   12:00 AM  CMP  BUN 4 - _0 37      Creatinine 0.5 - 1.1 0.8     1.0     1.3      Sodium 137 - 147 133     136     138      Potassium 3.5 - 5.1 mEq/L 3.3     3.6     3.0      Chloride 99 - 108 98     104     103      CO2 13 - _1 Calcium 8.7 - 10.7 9.0     8.6     9.0  Total Protein 6.3 - 8.2 g/dL   6        Alkaline Phos 25 - 125 105     71     120      AST 13 - 35 _0 ALT 7 - 35 U/L _1 This result is from an external source.        Recent Labs  No results found for: "CEA1", "CEA"   /  Last Labs  No results found for: "CEA1", "CEA"   Recent Labs  No results found for: "PSA1"   Recent Labs  No results found for: "OJZ530"   Recent Labs  No results found for: "CAN125"    Recent Labs  No results found for: "TOTALPROTELP", "ALBUMINELP", "A1GS", "A2GS", "BETS", "BETA2SER", "GAMS", "MSPIKE", "SPEI"   Recent Labs  No results found for: "TIBC", "FERRITIN", "IRONPCTSAT"   Recent Labs  No results found for: "LDH"       STUDIES:  EXAM:05/05/22 NUCLEAR MEDICINE WHOLE BODY BONE SCAN IMPRESSION: Uptake at LEFT tibial metaphysis especially medially, nonspecific; radiographic correlation recommended to exclude metastatic focus.   Focal abnormal tracer uptake at the LEFT temporo-occipital calvarium, corresponding to and focus of abnormal marrow signal on prior MR head exams back to 2010,  nonspecific but likely benign.   No additional scintigraphic findings to suggest osseous metastatic disease.   Diffuse calvarial uptake, favoring metabolic bone disease.    EXAM:05/05/22 CT CHEST WITH CONTRAST IMPRESSION: Ground glass nodule in the right upper lobe with a possible developing internal solid component. Lesion has shown indolent growth from 11/11/2017, at which time it measured approcimately 1.2 x 1.8 cm. Findings are worrisome for adenocarcinoma.  No definitive evidence of metastatic breast cancer. Steatotic cirrhotic liver   EXAM:03/18/2022 REPORT SURGICAL PATHOLOGY DIAGNOSIS Breast, lumpectomy, Left UOQ Invasive ductal carcinoma, grade 3, and ductal carcinoma in situ Ribbon clip and biopsy site present All margins negative for invasive carcinoma and DCIS Fibrocystic changes Lymph node, sentinel, biopsy One lymph node negative for tumor (0/1)   Estrogen receptor: 0% Progesterone receptor: 0% HER2 negative at 1+ Proliferation marker Ki-67: 40%   Bone density scan 01/27/2022 1.  Osteoporosis of the spine with a T score of -2.5, worse 2.  Left femur - osteoporosis with a T score of -2.7, slightly better 3.  Dual femur total mean-osteoporosis with a T score of -2.5, worse     Allergies:       Allergies  Allergen Reactions   Amoxicillin Hives      vomiting   Celecoxib Hives   Claritin [Loratadine] Other (See Comments)      Pt's daughter notified us of this allergy.   Insulins Nausea And Vomiting      Patient unsure of name of insulin, will call us back   Vioxx [Rofecoxib] Hives   Liraglutide Nausea And Vomiting   Propranolol Nausea And Vomiting      diarrhea diarrhea diarrhea        Current Medications:       Current Outpatient Medications  Medication Sig Dispense Refill   aspirin EC 81 MG tablet Take 81 mg by mouth daily.       atenolol (TENORMIN) 50 MG tablet Take 75 mg by mouth 2 (two) times daily.        busPIRone (BUSPAR) 7.5 MG tablet  Take 1  tablet by mouth in the morning, at noon, and at bedtime.       dexamethasone (DECADRON) 4 MG tablet Take 2 tabs by mouth 2 times daily starting day before chemo. Then take 2 tabs daily for 2 days starting day after chemo. Take with food. 30 tablet 1   DULoxetine (CYMBALTA) 60 MG capsule Take 120 mg by mouth at bedtime.        famotidine (PEPCID) 20 MG tablet Take 20 mg by mouth 2 (two) times daily.       fenofibrate (TRICOR) 145 MG tablet Take 145 mg by mouth daily.       fexofenadine (ALLEGRA) 180 MG tablet Take 180 mg by mouth daily.       gabapentin (NEURONTIN) 600 MG tablet Take 600 mg by mouth 3 (three) times daily.       hydrALAZINE (APRESOLINE) 50 MG tablet Take 50 mg by mouth 3 (three) times daily.       lidocaine-prilocaine (EMLA) cream Apply to affected area once 30 g 3   LORazepam (ATIVAN) 0.5 MG tablet Take 1 tablet (0.5 mg total) by mouth at bedtime as needed for sleep. May take one tab prior to imaging studies due to anxiety 30 tablet 0   Melatonin 1 MG CAPS Take by mouth. (Patient not taking: Reported on 04/30/2022)       metoprolol tartrate (LOPRESSOR) 25 MG tablet Take 25 mg by mouth 2 (two) times daily.       nicotine (NICODERM CQ - DOSED IN MG/24 HOURS) 21 mg/24hr patch Place 1 patch (21 mg total) onto the skin daily. 28 patch 0   nitroGLYCERIN (NITROSTAT) 0.4 MG SL tablet Place 0.4 mg under the tongue every 5 (five) minutes as needed. For chest pain       ondansetron (ZOFRAN-ODT) 4 MG disintegrating tablet Take 4 mg by mouth every 6 (six) hours as needed.       oxyCODONE (OXY IR/ROXICODONE) 5 MG immediate release tablet Take 5 mg by mouth every 6 (six) hours as needed.       pantoprazole (PROTONIX) 40 MG tablet Take 40 mg by mouth 2 (two) times daily.       potassium chloride (KLOR-CON M) 10 MEQ tablet Take 1 tablet (10 mEq total) by mouth daily. 30 tablet 5   prochlorperazine (COMPAZINE) 10 MG tablet Take 1 tablet (10 mg total) by mouth every 6 (six) hours as needed for nausea  or vomiting. 30 tablet 1   tirzepatide (MOUNJARO) 2.5 MG/0.5ML Pen INJECT 2.5 MILLIGRAM EVERY WEEK       TRESIBA FLEXTOUCH 100 UNIT/ML FlexTouch Pen Inject into the skin.       valsartan-hydrochlorothiazide (DIOVAN-HCT) 320-25 MG tablet TAKE 1 TABLET BY ORAL ROUTE ONCE DAILY FOR HIGH BLOOD PRESSURE       zolpidem (AMBIEN) 10 MG tablet Take 10 mg by mouth at bedtime as needed for sleep.  (Patient not taking: Reported on 04/30/2022)        No current facility-administered medications for this visit.      I,Gabriella Ballesteros,acting as a scribe for Derwood Kaplan, MD.,have documented all relevant documentation on the behalf of Derwood Kaplan, MD,as directed by  Derwood Kaplan, MD while in the presence of Derwood Kaplan, MD.

## 2022-08-07 ENCOUNTER — Inpatient Hospital Stay (INDEPENDENT_AMBULATORY_CARE_PROVIDER_SITE_OTHER): Payer: Medicare Other | Admitting: Oncology

## 2022-08-07 ENCOUNTER — Other Ambulatory Visit: Payer: Self-pay | Admitting: Oncology

## 2022-08-07 ENCOUNTER — Inpatient Hospital Stay: Payer: Medicare Other

## 2022-08-07 ENCOUNTER — Encounter: Payer: Self-pay | Admitting: Oncology

## 2022-08-07 VITALS — BP 110/61 | HR 106 | Temp 97.9°F | Resp 18 | Ht 61.0 in | Wt 132.2 lb

## 2022-08-07 DIAGNOSIS — E860 Dehydration: Secondary | ICD-10-CM | POA: Diagnosis not present

## 2022-08-07 DIAGNOSIS — Z171 Estrogen receptor negative status [ER-]: Secondary | ICD-10-CM

## 2022-08-07 DIAGNOSIS — G620 Drug-induced polyneuropathy: Secondary | ICD-10-CM

## 2022-08-07 DIAGNOSIS — T451X5A Adverse effect of antineoplastic and immunosuppressive drugs, initial encounter: Secondary | ICD-10-CM | POA: Diagnosis not present

## 2022-08-07 DIAGNOSIS — C50412 Malignant neoplasm of upper-outer quadrant of left female breast: Secondary | ICD-10-CM

## 2022-08-07 DIAGNOSIS — Z5111 Encounter for antineoplastic chemotherapy: Secondary | ICD-10-CM | POA: Diagnosis not present

## 2022-08-07 DIAGNOSIS — D649 Anemia, unspecified: Secondary | ICD-10-CM | POA: Diagnosis not present

## 2022-08-07 DIAGNOSIS — Z170 Estrogen receptor positive status [ER+]: Secondary | ICD-10-CM | POA: Diagnosis not present

## 2022-08-07 LAB — BASIC METABOLIC PANEL
BUN: 18 (ref 4–21)
CO2: 18 (ref 13–22)
Chloride: 105 (ref 99–108)
Creatinine: 1.2 — AB (ref 0.5–1.1)
Glucose: 153
Potassium: 3 mEq/L — AB (ref 3.5–5.1)
Sodium: 139 (ref 137–147)

## 2022-08-07 LAB — COMPREHENSIVE METABOLIC PANEL WITH GFR
Albumin: 4 (ref 3.5–5.0)
Calcium: 9.4 (ref 8.7–10.7)

## 2022-08-07 LAB — CBC AND DIFFERENTIAL
HCT: 40 (ref 36–46)
Hemoglobin: 13.1 (ref 12.0–16.0)
Neutrophils Absolute: 8.32
Platelets: 205 K/uL (ref 150–400)
WBC: 10.4

## 2022-08-07 LAB — HEPATIC FUNCTION PANEL
ALT: 13 U/L (ref 7–35)
AST: 20 (ref 13–35)
Alkaline Phosphatase: 94 (ref 25–125)
Bilirubin, Total: 0.6

## 2022-08-07 LAB — CBC: RBC: 4.29 (ref 3.87–5.11)

## 2022-08-07 MED ORDER — SODIUM CHLORIDE 0.9% FLUSH
10.0000 mL | INTRAVENOUS | Status: DC | PRN
  Administered 2022-08-07: 10 mL

## 2022-08-07 MED ORDER — HEPARIN SOD (PORK) LOCK FLUSH 100 UNIT/ML IV SOLN
500.0000 [IU] | Freq: Once | INTRAVENOUS | Status: AC | PRN
  Administered 2022-08-07: 500 [IU]

## 2022-08-07 MED ORDER — HYDROCODONE-ACETAMINOPHEN 5-325 MG PO TABS: 1.0000 | ORAL_TABLET | ORAL | 0 refills | Status: DC | PRN

## 2022-08-07 MED ORDER — SODIUM CHLORIDE 0.9 % IV SOLN: Freq: Once | INTRAVENOUS | Status: AC

## 2022-08-07 NOTE — Progress Notes (Addendum)
Lindsay KitchenADDENDUM: Her potassium is only 3.0 so I will have her increase her oral potassium supplement to QID of the 10 meq strength. She will likely need IV supplement next time.  [facility]  [address] [place],    [address] [phone]   Clinic Day: 08/07/22    Referring physician: Marco Collie, MD     ASSESSMENT & PLAN:  Stage IA Left breast cancer This is a grade 3 triple negative 21 mm tumor for a T1cN0M0 with a Ki-67 of 40%. She has had resection with clear margins and negative sentinel node. However, I recommended adjuvant chemotherapy with docetaxol and cytoxan every 3 weeks for 4 cycles to avoid anthracyclines.  She has now completed this 07/13/22.    Neuropathy She complains of numbness and pain of both legs that she rates as a 9/10. She also complains of back pain, which could be related to the Neulasta. Neuropathy of her feet is considerably worse and I will reduce her chemotherapy dose by another 15%.   Family history of breast cancer and BRCA positivity Yet she is BRCA negative.   Lung nodule This is a ground glass nodule of the right upper lobe which has shown indolent growth from 1.8 cm in April of 2019 to 2.2 cm currently. We will plan to monitor this with repeat imaging periodically.   Liver cirrhosis Liver is decreased in attenuation diffusely with mildly irregular margins. I don't think she was aware of this diagnosis but it may explain her increased toxicities.   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   Memory impairment This is consistent with early dementia. She also has significant hearing impairment.    Plan:  She is currently very dehydrated and hypotensive so we will administer 1 liter of IV normal saline and encourage her to drink more fluids. Her daughter and I have recommended that she wait a little longer  before having the port removed in case she needs it again. We want her to stay off the Ambien and Ativan and I suggested melatonin for sleep. I did agree to refill a small amount of hydrocodone to help with her neuropathy and recommended she take 1 half pill at a time. Normally I would recommend post operative radiation after chemotherapy but I am not sure she can tolerate this with her weakness and comorbidities. I reassured her that these toxicities will go away, and I find no sign of cancer. I will see her back in 2 weeks with CBC and CMP and we can discuss further regarding the possibility of radiation. She would be too frail to tolerate it at present.   The patient agreed with the plan and demonstrated an understanding of the instructions.  The patient was advised to call back if the symptoms worsen or if the condition fails to improve as anticipated.     I provided 20 minutes of face-to-face time during this this encounter and > 50% was spent counseling as documented under my assessment and plan.    Derwood Kaplan, MD [facility] [address] [place] [address] Dept: [phone] Dept Fax: [phone]        CHIEF COMPLAINT:  CC: Invasive ductal carcinoma   Current Treatment:  chemotherapy     HISTORY OF PRESENT ILLNESS:       Oncology History  Breast cancer of  upper-outer quadrant of left female breast ([facility])  03/18/2022 Initial Diagnosis    Breast cancer of upper-outer quadrant of left female breast ([facility])    04/28/2022 Cancer Staging    Staging form: Breast, AJCC 8th Edition - Clinical stage from 04/28/2022: Stage IB (cT1c, cN0(sn), cM0, G3, ER-, PR-, HER2-) - Signed by Derwood Kaplan, MD on 04/28/2022 Histopathologic type: Infiltrating duct carcinoma, NOS Stage prefix: Initial diagnosis Method of lymph node assessment: Sentinel lymph node biopsy Nuclear grade: G3 Multigene prognostic tests  performed: None Histologic grading system: 3 grade system Laterality: Left Tumor size (mm): 20 Lymph-vascular invasion (LVI): LVI not present (absent)/not identified Diagnostic confirmation: Positive histology Specimen type: Excision Staged by: Managing physician Menopausal status: Postmenopausal Ki-67 (%): 40 Stage used in treatment planning: Yes National guidelines used in treatment planning: Yes Type of national guideline used in treatment planning: NCCN    05/07/2022 -  Chemotherapy    Patient is on Treatment Plan : BREAST TC q21d     05/19/2022 Genetic Testing    Negative hereditary cancer genetic testing: no pathogenic variant detected in [facility] Common Hereditary Cancers +RNA Panel.  Report date is May 19, 2022.     The Common Hereditary Cancers + RNA Panel offered by [facility] includes sequencing, deletion/duplication, and RNA testing of the following 47 genes: APC, ATM, AXIN2, BARD1, BMPR1A, BRCA1, BRCA2, BRIP1, CDH1, CDK4*, CDKN2A (p14ARF)*, CDKN2A (p16INK4a)*, CHEK2, CTNNA1, DICER1, EPCAM (Deletion/duplication testing only), GREM1 (promoter region deletion/duplication testing only), KIT, MEN1, MLH1, MSH2, MSH3, MSH6, MUTYH, NBN, NF1, NHTL1, PALB2, PDGFRA*, PMS2, POLD1, POLE, PTEN, RAD50, RAD51C, RAD51D, SDHB, SDHC, SDHD, SMAD4, SMARCA4. STK11, TP53, TSC1, TSC2, and VHL.  The following genes were evaluated for sequence changes only: SDHA and HOXB13 c.251G>A variant only.  RNA analysis is not performed for the * genes.      Lindsay Brooks is a 71 year old woman who had a screening mammogram on 01/27/2022 and was found to have a possible left breast mass. She does get yearly mammograms due to her strong family history. She thought  she could feel a lump after this was found. She had a diagnostic mammogram on 02/11/2022 with a persistent spiculated hyperdense mass with associated calcifications in the upper inner quadrant. Ultrasound confirmed an irregular hypoechoic mass with peripheral  vascularity at 11:30, 3 cm from the nipple. This measures 2.2 cm and the left axilla was negative. She was referred to [name] after a biopsy was done on 02/17/2022, which confirmed a grade 3 invasive ductal carcinoma which is triple negative with a Ki-67 of 40%. [name] took her to surgery on 03/18/2022 for lumpectomy and sentinel lymph node. The final pathology revealed a 20 mm grade 3 invasive ductal carcinoma with DCIS. The margins were clear and the lymph node was negative for a T1cN0M0, stage IA. She has had a port placed.     INTERVAL HISTORY:  Robinn is seen here today for invasive ductal carcinoma, after she completed 4 cycles of  TC chemotherapy.   She states that she is not feeling well, can't sleep, has body shakes, and that her blood pressure was low at 97/66. I suggested that she resume taking melatonin to help with her sleep. She states that she has congestion and her phlegm is white at the moment. I am refilling her hydrocodone and removing her Ambien and Ativan from her med list. I recommend she stay off these medications. Skin turgor is severely decreased and she will receive 1 liter of IV normal saline. She has  peeling of the skin of her feet. There is a question of possible early dementia since she has memory impairment. I will see her back in 2 weeks with CBC and CMP. She wishes to have her port removed and will see Dr. Noberto Retort next month. Her daughter and I discussed the fact that she may occasionally need it for IV fluids until she is more stable. She denies signs of infection such as sore throat, sinus drainage, cough, or urinary symptoms.  She denies fevers or recurrent chills. She denies pain. She denies nausea, vomiting, chest pain, dyspnea or cough. Her weight has decreased 11 pounds over last month .   SOCIAL HISTORY She lives with her first daughter, who is present at today's visit. She has 2 daughters. She smokes 1.5 packs a day, and has been smoking since 71 yo. She is  not sure about quitting. She did smoke 2 packs per day for over 50 years. She doesn't drink other than rare occasion. She is widowed. She has been married 4 times. She is disabled.   Menstrual History 71 years old when she had her first child She has had 3 children 46 years old when starting menstrual period Menopause started at 67 She has had a partial hysterectomy in her 49's and went back for complete hysterectomy/BSO later. No hormone replacement therapy   FAMILY HISTORY Daughter had breast cancer at age 38 Aunt had breast cancer at age 22 Her sister had a brain tumor at age 75     SURGICAL HISTORY She had a partial Hysterectomy in her late 68's, and went back a few years later to have BSO. Tonsillectomy cataract surgery  Stents of the iliac and femoral arteries. Nerve repair of the left upper extremity   PAST MEDICAL HISTORY Osteoporosis Coronary artery disease COPD Diabetes  Migraine headaches Hyperlipidemia Hypertension TIA Anxiety GERD Peripheral vascular disease Hard of hearing     REVIEW OF SYSTEMS:  Review of Systems  Constitutional:  Positive for unexpected weight change. Negative for appetite change, chills, diaphoresis and fever.  HENT:   Positive for hearing loss. Negative for lump/mass, mouth sores, nosebleeds, sore throat, tinnitus, trouble swallowing and voice change.   Eyes: Negative.  Negative for eye problems and icterus.  Respiratory: Negative.  Negative for chest tightness, cough, hemoptysis, shortness of breath and wheezing.   Cardiovascular:  Positive for chest pain. Negative for leg swelling and palpitations.  Gastrointestinal:  Positive for nausea. Negative for abdominal distention, abdominal pain, blood in stool, constipation, diarrhea, rectal pain and vomiting.  Endocrine: Negative.  Negative for hot flashes.  Genitourinary: Negative.  Negative for difficulty urinating, dyspareunia, dysuria, frequency, hematuria, menstrual problem, vaginal  bleeding and vaginal discharge.   Musculoskeletal:  Positive for back pain and neck pain. Negative for arthralgias, flank pain and gait problem.       Pain in the legs she rates 10/10.  Skin: Negative.  Negative for itching, rash and wound.       Feet are peeling   Neurological: Negative.  Negative for dizziness, extremity weakness, gait problem, headaches, light-headedness, numbness, seizures and speech difficulty.  Hematological: Negative.  Negative for adenopathy. Does not bruise/bleed easily.  Psychiatric/Behavioral:  Positive for sleep disturbance. Negative for depression. The patient is not nervous/anxious.    Lindsay Brooks     VITALS:  Blood pressure (!) 112/55, pulse (!) 119, temperature 97.8 F (36.6 C), temperature source Oral, resp. rate 20, height _0  (1.549 m), weight 137 lb 1.6 oz (62.2 kg), SpO2  96 %.     Wt Readings from Last 3 Encounters:  06/24/22 140 lb (63.5 kg)  06/22/22 132 lb 1.3 oz (59.9 kg)  06/18/22 137 lb 1.6 oz (62.2 kg)    Body mass index is 25.9 kg/m.   Performance status (ECOG): 0 - Asymptomatic   PHYSICAL EXAM:  Physical Exam Vitals reviewed. Exam conducted with a chaperone present.  Constitutional:      General: She is not in acute distress.    Appearance: Normal appearance. She is well-developed and normal weight.  HENT:     Head: Normocephalic and atraumatic.     Right Ear: Ear canal and external ear normal. There is no impacted cerumen.     Left Ear: Tympanic membrane, ear canal and external ear normal. There is no impacted cerumen.     Nose: Nose normal. No congestion or rhinorrhea.     Mouth/Throat:     Mouth: Mucous membranes are moist.     Pharynx: Oropharynx is clear. No oropharyngeal exudate or posterior oropharyngeal erythema.  Eyes:     General: Lids are normal. Vision grossly intact. No scleral icterus.       Right eye: No discharge.        Left eye: No discharge.     Extraocular Movements: Extraocular movements intact.      Conjunctiva/sclera: Conjunctivae normal.     Pupils: Pupils are equal, round, and reactive to light.  Neck:     Vascular: No carotid bruit.  Cardiovascular:     Rate and Rhythm: Normal rate and regular rhythm.     Pulses: Normal pulses.     Heart sounds: Normal heart sounds. No murmur heard.    No friction rub. No gallop.  Pulmonary:     Effort: Pulmonary effort is normal. No respiratory distress.     Breath sounds: Normal breath sounds.  Chest:  Breasts:    Right: Normal.     Left: Normal.     Comments: She has a healing scar in the lateral left breast Abdominal:     General: Bowel sounds are normal. There is no distension.     Palpations: Abdomen is soft. There is no hepatomegaly, splenomegaly or mass.     Tenderness: There is abdominal tenderness. There is no right CVA tenderness, left CVA tenderness or rebound.     Hernia: No hernia is present.  Musculoskeletal:        General: No swelling, tenderness, deformity or signs of injury. Normal range of motion.     Cervical back: Normal range of motion and neck supple. No rigidity or tenderness.     Right lower leg: No edema.     Left lower leg: No edema.  Lymphadenopathy:     Cervical: No cervical adenopathy.     Right cervical: No superficial, deep or posterior cervical adenopathy.    Left cervical: No superficial, deep or posterior cervical adenopathy.     Upper Body:     Right upper body: No supraclavicular, axillary or pectoral adenopathy.     Left upper body: No supraclavicular, axillary or pectoral adenopathy.  Skin:    General: Skin is warm and dry.     Coloration: Skin is not jaundiced.     Findings: No bruising, erythema or lesion.     Comments: Decreased trugor  Neurological:     General: No focal deficit present.     Mental Status: She is alert and oriented to person, place, and time. Mental status is at baseline.  Psychiatric:        Mood and Affect: Mood normal.        Behavior: Behavior normal. Behavior is  cooperative.        Thought Content: Thought content normal.        Judgment: Judgment normal.         LABS:        Latest Ref Rng & Units 06/18/2022   12:00 AM 05/26/2022   12:00 AM 05/18/2022   12:00 AM  CBC  WBC   11.5     19.1     33.5      Hemoglobin 12.0 - 16.0 12.5     12.2     14.9      Hematocrit 36 - 46 38     37     45      Platelets 150 - 400 K/uL 234     238     247        CBC        Component Ref Range & Units 4 wk ago (07/09/22) 1 mo ago (06/18/22) 2 mo ago (05/26/22) 2 mo ago (05/18/22) 3 mo ago (04/30/22)  Hemoglobin 12.0 - 16.0 10.4 Abnormal  12.5 12.2 14.9 13.9  HCT 36 - 46 31 Abnormal  38 37 45 42  Neutrophils Absolute  9.83 8.97 14.52 22.11 6.28  Platelets 150 - 400 K/uL 239 234 238 247 215  WBC  12.6 11.5       This result is from an external source.        Latest Ref Rng & Units 06/18/2022   12:00 AM 05/26/2022   12:00 AM 05/18/2022   12:00 AM  CMP  BUN 4 - _0 37      Creatinine 0.5 - 1.1 0.8     1.0     1.3      Sodium 137 - 147 133     136     138      Potassium 3.5 - 5.1 mEq/L 3.3     3.6     3.0      Chloride 99 - 108 98     104     103      CO2 13 - _1 Calcium 8.7 - 10.7 9.0     8.6     9.0      Total Protein 6.3 - 8.2 g/dL   6        Alkaline Phos 25 - 125 105     71     120      AST 13 - 35 _2 ALT 7 - 35 U/L _3 This result is from an external source.    CMP    Component Ref Range & Units 4 wk ago (07/09/22)  Sodium 135 - 145 mmol/L 141  Potassium 3.5 - 5.1 mmol/L 4.4  Chloride 98 - 111 mmol/L 108  CO2 22 - 32 mmol/L 25  Glucose, Bld 70 - 99 mg/dL 184 High     BUN 8 - 23 mg/dL 11  Creatinine 0.44 - 1.00 mg/dL 0.90  Calcium 8.9 - 10.3 mg/dL  8.6 Low   Total Protein 6.5 - 8.1 g/dL 5.4 Low   Albumin 3.5 - 5.0 g/dL 2.8 Low   AST 15 - 41 U/L 15  ALT 0 - 44 U/L 8         Recent Labs  No results found for: "CEA1", "CEA"   /  Last Labs  No results  found for: "CEA1", "CEA"   Recent Labs  No results found for: "PSA1"   Recent Labs  No results found for: "ZYS063"   Recent Labs  No results found for: "CAN125"    Recent Labs  No results found for: "TOTALPROTELP", "ALBUMINELP", "A1GS", "A2GS", "BETS", "BETA2SER", "GAMS", "MSPIKE", "SPEI"   Recent Labs  No results found for: "TIBC", "FERRITIN", "IRONPCTSAT"   Recent Labs  No results found for: "LDH"       STUDIES:  EXAM:05/05/22 NUCLEAR MEDICINE WHOLE BODY BONE SCAN IMPRESSION: Uptake at LEFT tibial metaphysis especially medially, nonspecific; radiographic correlation recommended to exclude metastatic focus.   Focal abnormal tracer uptake at the LEFT temporo-occipital calvarium, corresponding to and focus of abnormal marrow signal on prior MR head exams back to 2010, nonspecific but likely benign.   No additional scintigraphic findings to suggest osseous metastatic disease.   Diffuse calvarial uptake, favoring metabolic bone disease.    EXAM:05/05/22 CT CHEST WITH CONTRAST IMPRESSION: Ground glass nodule in the right upper lobe with a possible developing internal solid component. Lesion has shown indolent growth from 11/11/2017, at which time it measured approcimately 1.2 x 1.8 cm. Findings are worrisome for adenocarcinoma.  No definitive evidence of metastatic breast cancer. Steatotic cirrhotic liver   EXAM:03/18/2022 REPORT SURGICAL PATHOLOGY DIAGNOSIS Breast, lumpectomy, Left UOQ Invasive ductal carcinoma, grade 3, and ductal carcinoma in situ Ribbon clip and biopsy site present All margins negative for invasive carcinoma and DCIS Fibrocystic changes Lymph node, sentinel, biopsy One lymph node negative for tumor (0/1)   Estrogen receptor: 0% Progesterone receptor: 0% HER2 negative at 1+ Proliferation marker Ki-67: 40%   Bone density scan 01/27/2022 1.  Osteoporosis of the spine with a T score of -2.5, worse 2.  Left femur - osteoporosis with a T score of -2.7,  slightly better 3.  Dual femur total mean-osteoporosis with a T score of -2.5, worse     Allergies:       Allergies  Allergen Reactions   Amoxicillin Hives      vomiting   Celecoxib Hives   Claritin [Loratadine] Other (See Comments)      Pt's daughter notified us of this allergy.   Insulins Nausea And Vomiting      Patient unsure of name of insulin, will call us back   Vioxx [Rofecoxib] Hives   Liraglutide Nausea And Vomiting   Propranolol Nausea And Vomiting      diarrhea diarrhea diarrhea        Current Medications:       Current Outpatient Medications  Medication Sig Dispense Refill   aspirin EC 81 MG tablet Take 81 mg by mouth daily.       atenolol (TENORMIN) 50 MG tablet Take 75 mg by mouth 2 (two) times daily.        busPIRone (BUSPAR) 7.5 MG tablet Take 1 tablet by mouth in the morning, at noon, and at bedtime.       dexamethasone (DECADRON) 4 MG tablet Take 2 tabs by mouth 2 times daily starting day before chemo. Then take 2 tabs daily for 2 days starting day after chemo. Take with  food. 30 tablet 1   DULoxetine (CYMBALTA) 60 MG capsule Take 120 mg by mouth at bedtime.        famotidine (PEPCID) 20 MG tablet Take 20 mg by mouth 2 (two) times daily.       fenofibrate (TRICOR) 145 MG tablet Take 145 mg by mouth daily.       fexofenadine (ALLEGRA) 180 MG tablet Take 180 mg by mouth daily.       gabapentin (NEURONTIN) 600 MG tablet Take 600 mg by mouth 3 (three) times daily.       hydrALAZINE (APRESOLINE) 50 MG tablet Take 50 mg by mouth 3 (three) times daily.       lidocaine-prilocaine (EMLA) cream Apply to affected area once 30 g 3   LORazepam (ATIVAN) 0.5 MG tablet Take 1 tablet (0.5 mg total) by mouth at bedtime as needed for sleep. May take one tab prior to imaging studies due to anxiety 30 tablet 0   Melatonin 1 MG CAPS Take by mouth. (Patient not taking: Reported on 04/30/2022)       metoprolol tartrate (LOPRESSOR) 25 MG tablet Take 25 mg by mouth 2 (two) times  daily.       nicotine (NICODERM CQ - DOSED IN MG/24 HOURS) 21 mg/24hr patch Place 1 patch (21 mg total) onto the skin daily. 28 patch 0   nitroGLYCERIN (NITROSTAT) 0.4 MG SL tablet Place 0.4 mg under the tongue every 5 (five) minutes as needed. For chest pain       ondansetron (ZOFRAN-ODT) 4 MG disintegrating tablet Take 4 mg by mouth every 6 (six) hours as needed.       oxyCODONE (OXY IR/ROXICODONE) 5 MG immediate release tablet Take 5 mg by mouth every 6 (six) hours as needed.       pantoprazole (PROTONIX) 40 MG tablet Take 40 mg by mouth 2 (two) times daily.       potassium chloride (KLOR-CON M) 10 MEQ tablet Take 1 tablet (10 mEq total) by mouth daily. 30 tablet 5   prochlorperazine (COMPAZINE) 10 MG tablet Take 1 tablet (10 mg total) by mouth every 6 (six) hours as needed for nausea or vomiting. 30 tablet 1   tirzepatide (MOUNJARO) 2.5 MG/0.5ML Pen INJECT 2.5 MILLIGRAM EVERY WEEK       TRESIBA FLEXTOUCH 100 UNIT/ML FlexTouch Pen Inject into the skin.       valsartan-hydrochlorothiazide (DIOVAN-HCT) 320-25 MG tablet TAKE 1 TABLET BY ORAL ROUTE ONCE DAILY FOR HIGH BLOOD PRESSURE       zolpidem (AMBIEN) 10 MG tablet Take 10 mg by mouth at bedtime as needed for sleep.  (Patient not taking: Reported on 04/30/2022)        No current facility-administered medications for this visit.         I,Jasmine M Lassiter,acting as a scribe for Derwood Kaplan, MD.,have documented all relevant documentation on the behalf of Derwood Kaplan, MD,as directed by  Derwood Kaplan, MD while in the presence of Derwood Kaplan, MD.

## 2022-08-07 NOTE — Progress Notes (Signed)
Face to face visit with pt and daughter in [place]. Pt has completed her chemotherapy and expects that radiation will be her next step. Pt reports that she still has problems with neuropathy from the chemo.

## 2022-08-10 DIAGNOSIS — E1169 Type 2 diabetes mellitus with other specified complication: Secondary | ICD-10-CM | POA: Diagnosis not present

## 2022-08-10 DIAGNOSIS — E785 Hyperlipidemia, unspecified: Secondary | ICD-10-CM | POA: Diagnosis not present

## 2022-08-10 DIAGNOSIS — I10 Essential (primary) hypertension: Secondary | ICD-10-CM | POA: Diagnosis not present

## 2022-08-15 ENCOUNTER — Encounter: Payer: Self-pay | Admitting: Oncology

## 2022-08-17 NOTE — Assessment & Plan Note (Deleted)
Stage IB triple negative breast cancer. She had difficulty tolerating chemotherapy with docetaxel/cyclophosphamide.  We reduced her doses by 20% with her second cycle due to poor tolerance.  We decreased her doses by additional 15% with her third cycle due to significant neuropathy.  She is still recovering and received IV fluids again last week.  We will have her see Dr. Orlene Erm to get set up for adjuvant radiation therapy.

## 2022-08-17 NOTE — Assessment & Plan Note (Deleted)
Peripheral neuropathy, worsened with chemotherapy.  She is on gabapentin 600 milligrams 3 times daily, as well as hydrocodone/APAP 5/325, one half a tablet every 4 hours as needed.

## 2022-08-21 ENCOUNTER — Inpatient Hospital Stay: Payer: 59

## 2022-08-21 ENCOUNTER — Encounter: Payer: Self-pay | Admitting: Hematology and Oncology

## 2022-08-21 ENCOUNTER — Inpatient Hospital Stay: Payer: 59 | Attending: Hematology and Oncology

## 2022-08-21 ENCOUNTER — Other Ambulatory Visit

## 2022-08-21 ENCOUNTER — Ambulatory Visit: Admitting: Hematology and Oncology

## 2022-08-21 ENCOUNTER — Inpatient Hospital Stay (INDEPENDENT_AMBULATORY_CARE_PROVIDER_SITE_OTHER): Payer: 59 | Admitting: Hematology and Oncology

## 2022-08-21 VITALS — BP 130/59 | HR 99 | Temp 97.8°F | Resp 18 | Ht 61.0 in | Wt 132.0 lb

## 2022-08-21 VITALS — BP 102/70 | HR 108 | Temp 98.2°F | Resp 18 | Ht 61.0 in | Wt 131.5 lb

## 2022-08-21 DIAGNOSIS — I129 Hypertensive chronic kidney disease with stage 1 through stage 4 chronic kidney disease, or unspecified chronic kidney disease: Secondary | ICD-10-CM | POA: Insufficient documentation

## 2022-08-21 DIAGNOSIS — E1122 Type 2 diabetes mellitus with diabetic chronic kidney disease: Secondary | ICD-10-CM | POA: Diagnosis not present

## 2022-08-21 DIAGNOSIS — G620 Drug-induced polyneuropathy: Secondary | ICD-10-CM | POA: Diagnosis not present

## 2022-08-21 DIAGNOSIS — Z90710 Acquired absence of both cervix and uterus: Secondary | ICD-10-CM | POA: Diagnosis not present

## 2022-08-21 DIAGNOSIS — E860 Dehydration: Secondary | ICD-10-CM | POA: Diagnosis not present

## 2022-08-21 DIAGNOSIS — Z171 Estrogen receptor negative status [ER-]: Secondary | ICD-10-CM | POA: Diagnosis not present

## 2022-08-21 DIAGNOSIS — T451X5A Adverse effect of antineoplastic and immunosuppressive drugs, initial encounter: Secondary | ICD-10-CM

## 2022-08-21 DIAGNOSIS — Z794 Long term (current) use of insulin: Secondary | ICD-10-CM | POA: Insufficient documentation

## 2022-08-21 DIAGNOSIS — N189 Chronic kidney disease, unspecified: Secondary | ICD-10-CM | POA: Insufficient documentation

## 2022-08-21 DIAGNOSIS — R600 Localized edema: Secondary | ICD-10-CM | POA: Diagnosis not present

## 2022-08-21 DIAGNOSIS — Z79899 Other long term (current) drug therapy: Secondary | ICD-10-CM | POA: Insufficient documentation

## 2022-08-21 DIAGNOSIS — Z7982 Long term (current) use of aspirin: Secondary | ICD-10-CM | POA: Insufficient documentation

## 2022-08-21 DIAGNOSIS — Z7902 Long term (current) use of antithrombotics/antiplatelets: Secondary | ICD-10-CM | POA: Diagnosis not present

## 2022-08-21 DIAGNOSIS — D649 Anemia, unspecified: Secondary | ICD-10-CM | POA: Diagnosis not present

## 2022-08-21 DIAGNOSIS — E876 Hypokalemia: Secondary | ICD-10-CM | POA: Insufficient documentation

## 2022-08-21 DIAGNOSIS — F17210 Nicotine dependence, cigarettes, uncomplicated: Secondary | ICD-10-CM | POA: Diagnosis not present

## 2022-08-21 DIAGNOSIS — R911 Solitary pulmonary nodule: Secondary | ICD-10-CM | POA: Insufficient documentation

## 2022-08-21 DIAGNOSIS — U071 COVID-19: Secondary | ICD-10-CM | POA: Diagnosis not present

## 2022-08-21 DIAGNOSIS — C50412 Malignant neoplasm of upper-outer quadrant of left female breast: Secondary | ICD-10-CM | POA: Insufficient documentation

## 2022-08-21 DIAGNOSIS — R0603 Acute respiratory distress: Secondary | ICD-10-CM | POA: Diagnosis not present

## 2022-08-21 LAB — HEPATIC FUNCTION PANEL
ALT: 13 U/L (ref 7–35)
AST: 21 (ref 13–35)
Alkaline Phosphatase: 87 (ref 25–125)
Bilirubin, Total: 0.4

## 2022-08-21 LAB — CBC AND DIFFERENTIAL
HCT: 40 (ref 36–46)
Hemoglobin: 13.2 (ref 12.0–16.0)
MCV: 94 (ref 81–99)
Neutrophils Absolute: 6.44
Platelets: 210 10*3/uL (ref 150–400)
WBC: 9.2

## 2022-08-21 LAB — BASIC METABOLIC PANEL
BUN: 14 (ref 4–21)
CO2: 18 (ref 13–22)
Chloride: 104 (ref 99–108)
Creatinine: 0.9 (ref 0.5–1.1)
Glucose: 144
Potassium: 3 mEq/L — AB (ref 3.5–5.1)
Sodium: 138 (ref 137–147)

## 2022-08-21 LAB — COMPREHENSIVE METABOLIC PANEL
Albumin: 3.9 (ref 3.5–5.0)
Calcium: 9 (ref 8.7–10.7)

## 2022-08-21 LAB — CBC: RBC: 4.32 (ref 3.87–5.11)

## 2022-08-21 MED ORDER — SODIUM CHLORIDE 0.9 % IV SOLN: Freq: Once | INTRAVENOUS | Status: AC

## 2022-08-21 MED ORDER — POTASSIUM CHLORIDE 10 MEQ/100ML IV SOLN
10.0000 meq | INTRAVENOUS | Status: AC
  Administered 2022-08-21 (×2): 10 meq via INTRAVENOUS
  Filled 2022-08-21 (×2): qty 100

## 2022-08-21 MED ORDER — SODIUM CHLORIDE 0.9% FLUSH: 10.0000 mL | Freq: Once | INTRAVENOUS | Status: DC | PRN

## 2022-08-21 MED ORDER — HEPARIN SOD (PORK) LOCK FLUSH 100 UNIT/ML IV SOLN: 500.0000 [IU] | Freq: Once | INTRAVENOUS | Status: DC | PRN

## 2022-08-21 NOTE — Addendum Note (Signed)
Addended by: Juanetta Beets on: 08/21/2022 11:52 AM   Modules accepted: Orders

## 2022-08-21 NOTE — Assessment & Plan Note (Signed)
Recurrent hypokalemia.  The patient's daughter states she is taking potassium chloride 10 mEq daily.  I will give her IV potassium today and have her daughter increase her potassium chloride to 10 mEq daily twice daily.

## 2022-08-21 NOTE — Addendum Note (Signed)
Addended by: Juanetta Beets on: 08/21/2022 11:02 AM   Modules accepted: Orders

## 2022-08-21 NOTE — Assessment & Plan Note (Signed)
She is mildly dehydrated, so we will proceed with IV fluids today. She knows to contact our office if she is not able to maintain her fluid intake.

## 2022-08-21 NOTE — Assessment & Plan Note (Addendum)
Stage IB triple negative breast cancer. She had difficulty tolerating chemotherapy with docetaxel/cyclophosphamide.  We reduced her doses by 20% with her second cycle due to poor tolerance.  We decreased her doses by additional 15% with her third cycle due to significant neuropathy.  She is still recovering and received IV fluids again last week.  She will see Dr. Orlene Erm on January 15th for consideration of adjuvant radiation therapy.

## 2022-08-21 NOTE — Assessment & Plan Note (Signed)
Right upper lobe groundglass nodule, which has been followed by [facility] health care pulmonology since 2015.  On most recent imaging in September, there was a 2.2 x 2.2 cm groundglass nodule in the right upper lobe with possible developing internal solid, previously 1.2 x 1.8 cm in 2019.  Continued attention on follow-up was recommended.

## 2022-08-21 NOTE — Patient Instructions (Signed)
Dehydration, Adult Dehydration is a condition in which there is not enough water or other fluids in the body. This happens when a person loses more fluids than he or she takes in. Important organs, such as the kidneys, brain, and heart, cannot function without a proper amount of fluids. Any loss of fluids from the body can lead to dehydration. Dehydration can be mild, moderate, or severe. It should be treated right away to prevent it from becoming severe. What are the causes? Dehydration may be caused by: Conditions that cause loss of water or other fluids, such as diarrhea, vomiting, or sweating or urinating a lot. Not drinking enough fluids, especially when you are ill or doing activities that require a lot of energy. Other illnesses and conditions, such as fever or infection. Certain medicines, such as medicines that remove excess fluid from the body (diuretics). Lack of safe drinking water. Not being able to get enough water and food. What increases the risk? The following factors may make you more likely to develop this condition: Having a long-term (chronic) illness that has not been treated properly, such as diabetes, heart disease, or kidney disease. Being 65 years of age or older. Having a disability. Living in a place that is high in altitude, where thinner, drier air causes more fluid loss. Doing exercises that put stress on your body for a long time (endurance sports). What are the signs or symptoms? Symptoms of dehydration depend on how severe it is. Mild or moderate dehydration Thirst. Dry lips or dry mouth. Dizziness or light-headedness, especially when standing up from a seated position. Muscle cramps. Dark urine. Urine may be the color of tea. Less urine or tears produced than usual. Headache. Severe dehydration Changes in skin. Your skin may be cold and clammy, blotchy, or pale. Your skin also may not return to normal after being lightly pinched and released. Little or  no tears, urine, or sweat. Changes in vital signs, such as rapid breathing and low blood pressure. Your pulse may be weak or may be faster than 100 beats a minute when you are sitting still. Other changes, such as: Feeling very thirsty. Sunken eyes. Cold hands and feet. Confusion. Being very tired (lethargic) or having trouble waking from sleep. Short-term weight loss. Loss of consciousness. How is this diagnosed? This condition is diagnosed based on your symptoms and a physical exam. You may have blood and urine tests to help confirm the diagnosis. How is this treated? Treatment for this condition depends on how severe it is. Treatment should be started right away. Do not wait until dehydration becomes severe. Severe dehydration is an emergency and needs to be treated in a hospital. Mild or moderate dehydration can be treated at home. You may be asked to: Drink more fluids. Drink an oral rehydration solution (ORS). This drink helps restore proper amounts of fluids and salts and minerals in the blood (electrolytes). Severe dehydration can be treated: With IV fluids. By correcting abnormal levels of electrolytes. This is often done by giving electrolytes through a tube that is passed through your nose and into your stomach (nasogastric tube, or NG tube). By treating the underlying cause of dehydration. Follow these instructions at home: Oral rehydration solution If told by your health care provider, drink an ORS: Make an ORS by following instructions on the package. Start by drinking small amounts, about  cup (120 mL) every 5-10 minutes. Slowly increase how much you drink until you have taken the amount recommended by your health   care provider. Eating and drinking        Drink enough clear fluid to keep your urine pale yellow. If you were told to drink an ORS, finish the ORS first and then start slowly drinking other clear fluids. Drink fluids such as: Water. Do not drink only  water. Doing that can lead to hyponatremia, which is having too little salt (sodium) in the body. Water from ice chips you suck on. Fruit juice that you have added water to (diluted fruit juice). Low-calorie sports drinks. Eat foods that contain a healthy balance of electrolytes, such as bananas, oranges, potatoes, tomatoes, and spinach. Do not drink alcohol. Avoid the following: Drinks that contain a lot of sugar. These include high-calorie sports drinks, fruit juice that is not diluted, and soda. Caffeine. Foods that are greasy or contain a lot of fat or sugar. General instructions Take over-the-counter and prescription medicines only as told by your health care provider. Do not take sodium tablets. Doing that can lead to having too much sodium in the body (hypernatremia). Return to your normal activities as told by your health care provider. Ask your health care provider what activities are safe for you. Keep all follow-up visits as told by your health care provider. This is important. Contact a health care provider if: You have muscle cramps, pain, or discomfort, such as: Pain in your abdomen and the pain gets worse or stays in one area (localizes). Stiff neck. You have a rash. You are more irritable than usual. You are sleepier or have a harder time waking than usual. You feel weak or dizzy. You feel very thirsty. Get help right away if you have: Any symptoms of severe dehydration. Symptoms of vomiting, such as: You cannot eat or drink without vomiting. Vomiting gets worse or does not go away. Vomit includes blood or green matter (bile). Symptoms that get worse with treatment. A fever. A severe headache. Problems with urination or bowel movements, such as: Diarrhea that gets worse or does not go away. Blood in your stool (feces). This may cause stool to look black and tarry. Not urinating, or urinating only a small amount of very dark urine, within 6-8 hours. Trouble  breathing. These symptoms may represent a serious problem that is an emergency. Do not wait to see if the symptoms will go away. Get medical help right away. Call your local emergency services (911 in the [place] Do not drive yourself to the hospital. Summary Dehydration is a condition in which there is not enough water or other fluids in the body. This happens when a person loses more fluids than he or she takes in. Treatment for this condition depends on how severe it is. Treatment should be started right away. Do not wait until dehydration becomes severe. Drink enough clear fluid to keep your urine pale yellow. If you were told to drink an oral rehydration solution (ORS), finish the ORS first and then start slowly drinking other clear fluids. Take over-the-counter and prescription medicines only as told by your health care provider. Get help right away if you have any symptoms of severe dehydration. This information is not intended to replace advice given to you by your health care provider. Make sure you discuss any questions you have with your health care provider. Document Revised: 12/03/2021 Document Reviewed: 03/09/2019 Elsevier Patient Education  2023 Elsevier Inc.  

## 2022-08-21 NOTE — Progress Notes (Signed)
[employer]  [address] [place],  [place]  [address] [phone]  Clinic Day:  08/21/2022  Referring physician: Marco Collie, MD  ASSESSMENT & PLAN:   Assessment & Plan: Breast cancer of upper-outer quadrant of left female breast ([place]) Stage IB triple negative breast cancer. She had difficulty tolerating chemotherapy with docetaxel/cyclophosphamide.  We reduced her doses by 20% with her second cycle due to poor tolerance.  We decreased her doses by additional 15% with her third cycle due to significant neuropathy.  She is still recovering and received IV fluids again last week.  She will see Dr. Orlene Erm on January 15th for consideration of adjuvant radiation therapy.    Neuropathy due to chemotherapeutic drug ([place]) Peripheral neuropathy, worsened with chemotherapy.  She is on gabapentin 600 milligrams 3 times daily, as well as hydrocodone/APAP 5/325, one half a tablet every 4 hours as needed.  Dehydration She is mildly dehydrated, so we will proceed with IV fluids today. She knows to contact our office if she is not able to maintain her fluid intake.  Hypokalemia due to loss of potassium Recurrent hypokalemia.  The patient's daughter states she is taking potassium chloride 10 mEq daily.  I will give her IV potassium today and have her daughter increase her potassium chloride to 10 mEq daily twice daily.  Solitary pulmonary nodule Right upper lobe groundglass nodule, which has been followed by [facility] health care pulmonology since 2015.  On most recent imaging in September, there was a 2.2 x 2.2 cm groundglass nodule in the right upper lobe with possible developing internal solid, previously 1.2 x 1.8 cm in 2019.  Continued attention on follow-up was recommended.    The patient understands the plans discussed today and is in agreement with them.  She knows to contact our office if she develops concerns prior to her next appointment.   I provided  20 minutes of face-to-face time during this encounter and > 50% was spent counseling as documented under my assessment and plan.    Lindsay Pickles, PA-C  [facility] [address] [place] [address] Dept: [phone] Dept Fax: [phone]   Orders Placed This Encounter  Procedures   CBC and differential    This external order was created through the Results Console.   CBC    This external order was created through the Results Console.   Basic metabolic panel    This external order was created through the Results Console.   Comprehensive metabolic panel    This external order was created through the Results Console.   Hepatic function panel    This external order was created through the Results Console.      CHIEF COMPLAINT:  CC: Stage IB HER2 receptor positive breast cancer  Current Treatment: Supportive care  HISTORY OF PRESENT ILLNESS:   Oncology History  Breast cancer of upper-outer quadrant of left female breast ([place])  03/18/2022 Initial Diagnosis   Breast cancer of upper-outer quadrant of left female breast ([name])   04/28/2022 Cancer Staging   Staging form: Breast, AJCC 8th Edition - Clinical stage from 04/28/2022: Stage IB (cT1c, cN0(sn), cM0, G3, ER-, PR-, HER2-) - Signed by Derwood Kaplan, MD on 04/28/2022 Histopathologic type: Infiltrating duct carcinoma, NOS Stage prefix: Initial diagnosis Method of lymph node assessment: Sentinel lymph node biopsy Nuclear grade: G3 Multigene prognostic tests performed: None Histologic grading system: 3 grade system Laterality: Left Tumor size (mm):  66 Lymph-vascular invasion (LVI): LVI not present (absent)/not identified Diagnostic confirmation: Positive histology Specimen type: Excision Staged by: Managing physician Menopausal status: Postmenopausal Ki-67 (%): 40 Stage used in treatment planning: Yes National guidelines used in treatment planning:  Yes Type of national guideline used in treatment planning: NCCN   05/07/2022 - 07/14/2022 Chemotherapy   Patient is on Treatment Plan : BREAST TC q21d     05/19/2022 Genetic Testing   Negative hereditary cancer genetic testing: no pathogenic variant detected in [facility] Common Hereditary Cancers +RNA Panel.  Report date is May 19, 2022.    The Common Hereditary Cancers + RNA Panel offered by [facility] includes sequencing, deletion/duplication, and RNA testing of the following 47 genes: APC, ATM, AXIN2, BARD1, BMPR1A, BRCA1, BRCA2, BRIP1, CDH1, CDK4*, CDKN2A (p14ARF)*, CDKN2A (p16INK4a)*, CHEK2, CTNNA1, DICER1, EPCAM (Deletion/duplication testing only), GREM1 (promoter region deletion/duplication testing only), KIT, MEN1, MLH1, MSH2, MSH3, MSH6, MUTYH, NBN, NF1, NHTL1, PALB2, PDGFRA*, PMS2, POLD1, POLE, PTEN, RAD50, RAD51C, RAD51D, SDHB, SDHC, SDHD, SMAD4, SMARCA4. STK11, TP53, TSC1, TSC2, and VHL.  The following genes were evaluated for sequence changes only: SDHA and HOXB13 c.251G>A variant only.  RNA analysis is not performed for the * genes.         INTERVAL HISTORY:  Lindsay Brooks is here today for repeat clinical assessment for continued supportive care after completing chemotherapy.  She reports persistent fatigue.  She reports lightheadedness when she stands.  She states she has been drinking better than at her last visit.   She continues to report occasional nausea and vomiting She denies fevers or chills. She denies pain. Her appetite remains decreased. Her weight has decreased 1 pounds over last 2 weeks .  She states her daughter feels she should have IV fluids again.   REVIEW OF SYSTEMS:  Review of Systems  Constitutional:  Positive for appetite change and fatigue. Negative for chills, fever and unexpected weight change.  HENT:   Negative for lump/mass, mouth sores and sore throat.   Respiratory:  Negative for cough and shortness of breath.   Cardiovascular:  Negative for chest pain and leg  swelling.  Gastrointestinal:  Positive for nausea and vomiting. Negative for abdominal pain, constipation and diarrhea.  Endocrine: Negative for hot flashes.  Genitourinary:  Negative for difficulty urinating, dysuria, frequency and hematuria.   Musculoskeletal:  Negative for arthralgias, back pain and myalgias.  Skin:  Negative for rash.  Neurological:  Positive for light-headedness. Negative for dizziness and headaches.  Hematological:  Negative for adenopathy. Does not bruise/bleed easily.  Psychiatric/Behavioral:  Negative for depression and sleep disturbance. The patient is not nervous/anxious.      VITALS:  Blood pressure 102/70, pulse (!) 108, temperature 98.2 F (36.8 C), temperature source Oral, resp. rate 18, height 5' 1"$  (1.549 m), weight 131 lb 8 oz (59.6 kg), SpO2 95 %.  Wt Readings from Last 3 Encounters:  08/21/22 132 lb 0.6 oz (59.9 kg)  08/21/22 131 lb 8 oz (59.6 kg)  08/07/22 132 lb 3.2 oz (60 kg)    Body mass index is 24.85 kg/m.  Performance status (ECOG): 2 - Symptomatic, <50% confined to bed  PHYSICAL EXAM:  Physical Exam Vitals and nursing note reviewed.  Constitutional:      General: She is not in acute distress.    Appearance: Normal appearance.  HENT:     Head: Normocephalic and atraumatic.     Mouth/Throat:     Mouth: Mucous membranes are dry.     Pharynx: Oropharynx is clear. No  oropharyngeal exudate or posterior oropharyngeal erythema.  Eyes:     General: No scleral icterus.    Extraocular Movements: Extraocular movements intact.     Conjunctiva/sclera: Conjunctivae normal.     Pupils: Pupils are equal, round, and reactive to light.  Cardiovascular:     Rate and Rhythm: Regular rhythm. Tachycardia present.     Heart sounds: Normal heart sounds. No murmur heard.    No friction rub. No gallop.  Pulmonary:     Effort: Pulmonary effort is normal.     Breath sounds: Normal breath sounds. No wheezing, rhonchi or rales.  Chest:     Comments:  Breast exam is deferred Abdominal:     General: There is no distension.     Palpations: Abdomen is soft. There is no hepatomegaly, splenomegaly or mass.     Tenderness: There is no abdominal tenderness.  Musculoskeletal:        General: Normal range of motion.     Cervical back: Normal range of motion and neck supple. No tenderness.     Right lower leg: No edema.     Left lower leg: No edema.  Lymphadenopathy:     Cervical: No cervical adenopathy.     Upper Body:     Right upper body: No supraclavicular or axillary adenopathy.     Left upper body: No supraclavicular or axillary adenopathy.     Lower Body: No right inguinal adenopathy. No left inguinal adenopathy.  Skin:    General: Skin is warm and dry.     Coloration: Skin is not jaundiced.     Findings: No rash.     Comments: Skin turgor is decreased  Neurological:     Mental Status: She is alert and oriented to person, place, and time.     Cranial Nerves: No cranial nerve deficit.  Psychiatric:        Mood and Affect: Mood normal.        Behavior: Behavior normal.        Thought Content: Thought content normal.    LABS:      Latest Ref Rng & Units 08/21/2022   12:00 AM 08/07/2022   12:00 AM 07/09/2022   12:00 AM  CBC  WBC  9.2     10.4     12.6      Hemoglobin 12.0 - 16.0 13.2     13.1     10.4      Hematocrit 36 - 46 40     40     31      Platelets 150 - 400 K/uL 210     205     239         This result is from an external source.      Latest Ref Rng & Units 08/21/2022   12:00 AM 08/07/2022   12:00 AM 07/09/2022    3:09 PM  CMP  Glucose 70 - 99 mg/dL   184   BUN 4 - 21 14     18     11   $ Creatinine 0.5 - 1.1 0.9     1.2     0.90   Sodium 137 - 147 138     139     141   Potassium 3.5 - 5.1 mEq/L 3.0     3.0     4.4   Chloride 99 - 108 104     105     108   CO2 13 - 22  $18     18     25   E$ Calcium 8.7 - 10.7 9.0     9.4     8.6   Total Protein 6.5 - 8.1 g/dL   5.4   Total Bilirubin 0.3 - 1.2 mg/dL   0.5    Alkaline Phos 25 - 125 87     94     58   AST 13 - 35 21     20     15   $ ALT 7 - 35 U/L 13     13     8      $ This result is from an external source.     No results found for: "CEA1", "CEA" / No results found for: "CEA1", "CEA" No results found for: "PSA1" No results found for: "[id number]" No results found for: "CAN125"  No results found for: "TOTALPROTELP", "ALBUMINELP", "A1GS", "A2GS", "BETS", "BETA2SER", "GAMS", "MSPIKE", "SPEI" No results found for: "TIBC", "FERRITIN", "IRONPCTSAT" No results found for: "LDH"  STUDIES:  No results found.    HISTORY:   Past Medical History:  Diagnosis Date   CAD (coronary artery disease)    a. s/p 2 CoStar DES to RCA 06/2004. b.  LHC 12/09:  EF 65%, oCFX 40%, oRCA 30%, then 30-40% leading into stented seg that is patent, (10-20% ISR), 40% beyond stent,;  c. EF 81%, poor exercise capacity, hypotensive BP response, + chest pain, normal nuclear images (overall low risk)   CKD (chronic kidney disease)    Colon polyps    a. s/p removal 01/2012 - was told one might be cancerous but was not diagnosed with colon cancer - plan is for f/u colonoscopy in 5 yrs.   GERD (gastroesophageal reflux disease)    Hx of echocardiogram    a. Echo 06/2011: EF 55-60%, Gr 1 diast dysfn, mild MR   Insomnia    Mini stroke    History of, in 2005 (before she was diagnosed with CAD).   Osteoporosis    Other and unspecified hyperlipidemia    Other isolated or specific phobias    Peripheral vascular disease, unspecified ([place])    a. s/p L common femoral & left illiac stenting previously (patent by angio in 2007). - ABI 06/2011 normal. b. Carotid US 0-39% BICA 06/2011 (for f/u 2 yrs);   c.  ABIs 9/13:  normal (1.1 bilaterally)   Personal history of peptic ulcer disease    Remotely.   Psychiatric pseudoseizure    per patient and family   Tobacco use disorder    Type II or unspecified type diabetes mellitus without mention of complication, not stated as uncontrolled     Unspecified essential hypertension     Past Surgical History:  Procedure Laterality Date   APPENDECTOMY     BREAST BIOPSY     CARDIAC CATHETERIZATION  2009   patent RCA stent. mild CAD otherwise.    CATARACT EXTRACTION, BILATERAL  2016   CHOLECYSTECTOMY  2009   hysterectomy (other)     age 72   KNEE ARTHROSCOPY Left    left arm surgery     LEFT HEART CATHETERIZATION WITH CORONARY ANGIOGRAM N/A 01/16/2014   Procedure: LEFT HEART CATHETERIZATION WITH CORONARY ANGIOGRAM;  Surgeon: Blane Ohara, MD;  Location: [facility] CATH LAB;  Service: Cardiovascular;  Laterality: N/A;   left leg angiography     stenting of the left common iliac artery   left rotator cuff repair     right coronary artery stent  Family History  Problem Relation Age of Onset   Heart attack Mother    Allergies Mother    Heart attack Father    Allergies Father    Kidney disease Father    Hypertension Father    Melanoma Sister    Coronary artery disease Brother    Breast cancer Maternal Aunt    Breast cancer Maternal Aunt    Breast cancer Paternal Aunt        d. under 87   Pancreatic cancer Maternal Grandmother    Breast cancer Daughter        BRCA1+    Social History:  reports that she has been smoking cigarettes. She has a 66.00 pack-year smoking history. She has never used smokeless tobacco. She reports that she does not drink alcohol and does not use drugs.The patient is accompanied by her significant other, Tim, today.  Allergies:  Allergies  Allergen Reactions   Amoxicillin Hives    vomiting   Celecoxib Hives   Claritin [Loratadine] Other (See Comments)    Pt's daughter notified us of this allergy.   Insulins Nausea And Vomiting    Patient unsure of name of insulin, will call us back   Vioxx [Rofecoxib] Hives   Liraglutide Nausea And Vomiting   Propranolol Nausea And Vomiting    diarrhea diarrhea diarrhea     Current Medications: Current Outpatient Medications  Medication Sig  Dispense Refill   aspirin EC 81 MG tablet Take 81 mg by mouth daily.     atenolol (TENORMIN) 50 MG tablet Take 75 mg by mouth 2 (two) times daily.      busPIRone (BUSPAR) 7.5 MG tablet Take 1 tablet by mouth in the morning, at noon, and at bedtime.     clopidogrel (PLAVIX) 75 MG tablet Take 75 mg by mouth daily.     DULoxetine (CYMBALTA) 60 MG capsule Take 120 mg by mouth at bedtime.      famotidine (PEPCID) 20 MG tablet Take 20 mg by mouth 2 (two) times daily.     fenofibrate (TRICOR) 145 MG tablet Take 145 mg by mouth daily.     fexofenadine (ALLEGRA) 180 MG tablet Take 180 mg by mouth daily.     gabapentin (NEURONTIN) 600 MG tablet Take 600 mg by mouth 3 (three) times daily.     hydrALAZINE (APRESOLINE) 50 MG tablet Take 50 mg by mouth 3 (three) times daily.     HYDROcodone-acetaminophen (NORCO) 5-325 MG tablet Take 1 tablet by mouth every 4 (four) hours as needed for moderate pain. 30 tablet 0   LANTUS SOLOSTAR 100 UNIT/ML Solostar Pen Inject into the skin.     LORazepam (ATIVAN) 0.5 MG tablet Take 1 tablet (0.5 mg total) by mouth at bedtime as needed for sleep. May take one tab prior to imaging studies due to anxiety 30 tablet 0   Melatonin 1 MG CAPS Take by mouth. (Patient not taking: Reported on 04/30/2022)     metoprolol tartrate (LOPRESSOR) 25 MG tablet Take 25 mg by mouth 2 (two) times daily.     nicotine (NICODERM CQ - DOSED IN MG/24 HOURS) 21 mg/24hr patch Place 1 patch (21 mg total) onto the skin daily. (Patient not taking: Reported on 08/21/2022) 28 patch 0   nitroGLYCERIN (NITROSTAT) 0.4 MG SL tablet Place 0.4 mg under the tongue every 5 (five) minutes as needed. For chest pain     ondansetron (ZOFRAN-ODT) 4 MG disintegrating tablet Take 4 mg by mouth every 6 (six) hours as  needed.     pantoprazole (PROTONIX) 40 MG tablet Take 40 mg by mouth 2 (two) times daily.     potassium chloride (KLOR-CON M) 10 MEQ tablet Take 1 tablet (10 mEq total) by mouth daily. 30 tablet 5   tirzepatide  (MOUNJARO) 2.5 MG/0.5ML Pen INJECT 2.5 MILLIGRAM EVERY WEEK     TRESIBA FLEXTOUCH 100 UNIT/ML FlexTouch Pen Inject into the skin.     valsartan-hydrochlorothiazide (DIOVAN-HCT) 320-25 MG tablet TAKE 1 TABLET BY ORAL ROUTE ONCE DAILY FOR HIGH BLOOD PRESSURE     zolpidem (AMBIEN) 10 MG tablet Take 10 mg by mouth at bedtime as needed for sleep.  (Patient not taking: Reported on 04/30/2022)     No current facility-administered medications for this visit.   Facility-Administered Medications Ordered in Other Visits  Medication Dose Route Frequency Provider Last Rate Last Admin   heparin lock flush 100 unit/mL  500 Units Intracatheter Once PRN Dinia Joynt, Vida Roller A, PA-C       potassium chloride 10 mEq in 100 mL IVPB  10 mEq Intravenous Q1 Hr x 2 Leilana Mcquire A, PA-C 100 mL/hr at 08/21/22 1201 10 mEq at 08/21/22 1201   sodium chloride flush (NS) 0.9 % injection 10 mL  10 mL Intracatheter PRN Derwood Kaplan, MD   10 mL at 08/07/22 1556   sodium chloride flush (NS) 0.9 % injection 10 mL  10 mL Intracatheter Once PRN Lindsay Pickles, PA-C

## 2022-08-21 NOTE — Assessment & Plan Note (Signed)
Peripheral neuropathy, worsened with chemotherapy.  She is on gabapentin 600 milligrams 3 times daily, as well as hydrocodone/APAP 5/325, one half a tablet every 4 hours as needed. 

## 2022-08-24 DIAGNOSIS — Z171 Estrogen receptor negative status [ER-]: Secondary | ICD-10-CM | POA: Diagnosis not present

## 2022-08-24 DIAGNOSIS — Z510 Encounter for antineoplastic radiation therapy: Secondary | ICD-10-CM | POA: Diagnosis not present

## 2022-08-24 DIAGNOSIS — C50412 Malignant neoplasm of upper-outer quadrant of left female breast: Secondary | ICD-10-CM | POA: Diagnosis not present

## 2022-08-25 DIAGNOSIS — C50412 Malignant neoplasm of upper-outer quadrant of left female breast: Secondary | ICD-10-CM | POA: Diagnosis not present

## 2022-08-25 DIAGNOSIS — Z510 Encounter for antineoplastic radiation therapy: Secondary | ICD-10-CM | POA: Diagnosis not present

## 2022-08-25 DIAGNOSIS — Z171 Estrogen receptor negative status [ER-]: Secondary | ICD-10-CM | POA: Diagnosis not present

## 2022-08-26 DIAGNOSIS — C50412 Malignant neoplasm of upper-outer quadrant of left female breast: Secondary | ICD-10-CM | POA: Diagnosis not present

## 2022-08-26 DIAGNOSIS — Z171 Estrogen receptor negative status [ER-]: Secondary | ICD-10-CM | POA: Diagnosis not present

## 2022-08-28 ENCOUNTER — Encounter: Payer: Self-pay | Admitting: Oncology

## 2022-08-28 DIAGNOSIS — C50412 Malignant neoplasm of upper-outer quadrant of left female breast: Secondary | ICD-10-CM | POA: Diagnosis not present

## 2022-08-28 DIAGNOSIS — J449 Chronic obstructive pulmonary disease, unspecified: Secondary | ICD-10-CM | POA: Diagnosis not present

## 2022-08-28 DIAGNOSIS — Z171 Estrogen receptor negative status [ER-]: Secondary | ICD-10-CM | POA: Diagnosis not present

## 2022-08-28 DIAGNOSIS — G620 Drug-induced polyneuropathy: Secondary | ICD-10-CM | POA: Diagnosis not present

## 2022-08-28 DIAGNOSIS — Z510 Encounter for antineoplastic radiation therapy: Secondary | ICD-10-CM | POA: Diagnosis not present

## 2022-08-31 DIAGNOSIS — C50412 Malignant neoplasm of upper-outer quadrant of left female breast: Secondary | ICD-10-CM | POA: Diagnosis not present

## 2022-08-31 DIAGNOSIS — Z510 Encounter for antineoplastic radiation therapy: Secondary | ICD-10-CM | POA: Diagnosis not present

## 2022-09-01 DIAGNOSIS — C50412 Malignant neoplasm of upper-outer quadrant of left female breast: Secondary | ICD-10-CM | POA: Diagnosis not present

## 2022-09-01 DIAGNOSIS — Z510 Encounter for antineoplastic radiation therapy: Secondary | ICD-10-CM | POA: Diagnosis not present

## 2022-09-02 DIAGNOSIS — Z510 Encounter for antineoplastic radiation therapy: Secondary | ICD-10-CM | POA: Diagnosis not present

## 2022-09-02 DIAGNOSIS — C50412 Malignant neoplasm of upper-outer quadrant of left female breast: Secondary | ICD-10-CM | POA: Diagnosis not present

## 2022-09-03 ENCOUNTER — Ambulatory Visit: Admitting: Oncology

## 2022-09-03 ENCOUNTER — Inpatient Hospital Stay: Payer: 59

## 2022-09-03 DIAGNOSIS — Z510 Encounter for antineoplastic radiation therapy: Secondary | ICD-10-CM | POA: Diagnosis not present

## 2022-09-03 DIAGNOSIS — C50412 Malignant neoplasm of upper-outer quadrant of left female breast: Secondary | ICD-10-CM | POA: Diagnosis not present

## 2022-09-03 NOTE — Progress Notes (Shared)
Marland KitchenADDENDUM: Her potassium is only 3.0 so I will have her increase her oral potassium supplement to QID of the 10 meq strength. She will likely need IV supplement next time.  Lindsay  [address] [place],  [place]  [address] [phone]   Clinic Day: 09/03/22    Referring physician: Marco Collie, MD     ASSESSMENT & PLAN:  Stage IA Left breast cancer This is a grade 3 triple negative 21 mm tumor for a T1cN0M0 with a Ki-67 of 40%. She has had resection with clear margins and negative sentinel node. However, I recommended adjuvant chemotherapy with docetaxol and cytoxan every 3 weeks for 4 cycles to avoid anthracyclines.  She has now completed this 07/13/22.    Neuropathy She complains of numbness and pain of both legs that she rates as a 9/10. She also complains of back pain, which could be related to the Neulasta. Neuropathy of her feet is considerably worse and I will reduce her chemotherapy dose by another 15%.   Family history of breast cancer and BRCA positivity Yet she is BRCA negative.   Lung nodule This is a ground glass nodule of the right upper lobe which has shown indolent growth from 1.8 cm in April of 2019 to 2.2 cm currently. We will plan to monitor this with repeat imaging periodically.   Liver cirrhosis Liver is decreased in attenuation diffusely with mildly irregular margins. I don't think she was aware of this diagnosis but it may explain her increased toxicities.   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   Memory impairment This is consistent with early dementia. She also has significant hearing impairment.    Plan:  She is currently very dehydrated and hypotensive so we will administer 1 liter of IV normal saline and encourage her to drink more fluids. Her daughter and I have recommended that she wait a little longer  before having the port removed in case she needs it again. We want her to stay off the Ambien and Ativan and I suggested melatonin for sleep. I did agree to refill a small amount of hydrocodone to help with her neuropathy and recommended she take 1 half pill at a time. Normally I would recommend post operative radiation after chemotherapy but I am not sure she can tolerate this with her weakness and comorbidities. I reassured her that these toxicities will go away, and I find no sign of cancer. I will see her back in 2 weeks with CBC and CMP and we can discuss further regarding the possibility of radiation. She would be too frail to tolerate it at present.   The patient agreed with the plan and demonstrated an understanding of the instructions.  The patient was advised to call back if the symptoms worsen or if the condition fails to improve as anticipated.     I provided 20 minutes of face-to-face time during this this encounter and > 50% was spent counseling as documented under my assessment and plan.    Lindsay Kaplan, MD [facility] [address] [place] [address] Dept: [phone] Dept Fax: [phone]        CHIEF COMPLAINT:  CC: Invasive ductal carcinoma   Current Treatment:  chemotherapy     HISTORY OF PRESENT ILLNESS:       Oncology History  Breast cancer of  upper-outer quadrant of left female breast ([facility])  03/18/2022 Initial Diagnosis    Breast cancer of upper-outer quadrant of left female breast ([facility])    04/28/2022 Cancer Staging    Staging form: Breast, AJCC 8th Edition - Clinical stage from 04/28/2022: Stage IB (cT1c, cN0(sn), cM0, G3, ER-, PR-, HER2-) - Signed by Lindsay Kaplan, MD on 04/28/2022 Histopathologic type: Infiltrating duct carcinoma, NOS Stage prefix: Initial diagnosis Method of lymph node assessment: Sentinel lymph node biopsy Nuclear grade: G3 Multigene prognostic tests  performed: None Histologic grading system: 3 grade system Laterality: Left Tumor size (mm): 20 Lymph-vascular invasion (LVI): LVI not present (absent)/not identified Diagnostic confirmation: Positive histology Specimen type: Excision Staged by: Managing physician Menopausal status: Postmenopausal Ki-67 (%): 40 Stage used in treatment planning: Yes National guidelines used in treatment planning: Yes Type of national guideline used in treatment planning: NCCN    05/07/2022 -  Chemotherapy    Patient is on Treatment Plan : BREAST TC q21d     05/19/2022 Genetic Testing    Negative hereditary cancer genetic testing: no pathogenic variant detected in [facility] Common Hereditary Cancers +RNA Panel.  Report date is May 19, 2022.     The Common Hereditary Cancers + RNA Panel offered by [facility] includes sequencing, deletion/duplication, and RNA testing of the following 47 genes: APC, ATM, AXIN2, BARD1, BMPR1A, BRCA1, BRCA2, BRIP1, CDH1, CDK4*, CDKN2A (p14ARF)*, CDKN2A (p16INK4a)*, CHEK2, CTNNA1, DICER1, EPCAM (Deletion/duplication testing only), GREM1 (promoter region deletion/duplication testing only), KIT, MEN1, MLH1, MSH2, MSH3, MSH6, MUTYH, NBN, NF1, NHTL1, PALB2, PDGFRA*, PMS2, POLD1, POLE, PTEN, RAD50, RAD51C, RAD51D, SDHB, SDHC, SDHD, SMAD4, SMARCA4. STK11, TP53, TSC1, TSC2, and VHL.  The following genes were evaluated for sequence changes only: SDHA and HOXB13 c.251G>A variant only.  RNA analysis is not performed for the * genes.      Lindsay [place] is a 72 year old woman who had a screening mammogram on 01/27/2022 and was found to have a possible left breast mass. She does get yearly mammograms due to her strong family history. She thought  she could feel a lump after this was found. She had a diagnostic mammogram on 02/11/2022 with a persistent spiculated hyperdense mass with associated calcifications in the upper inner quadrant. Ultrasound confirmed an irregular hypoechoic mass with peripheral  vascularity at 11:30, 3 cm from the nipple. This measures 2.2 cm and the left axilla was negative. She was referred to [name] after a biopsy was done on 02/17/2022, which confirmed a grade 3 invasive ductal carcinoma which is triple negative with a Ki-67 of 40%. [name] took her to surgery on 03/18/2022 for lumpectomy and sentinel lymph node. The final pathology revealed a 20 mm grade 3 invasive ductal carcinoma with DCIS. The margins were clear and the lymph node was negative for a T1cN0M0, stage IA. She has had a port placed.     INTERVAL HISTORY:  Lindsay [place] is seen here today for invasive ductal carcinoma, after she completed 4 cycles of  TC chemotherapy.   She states that she is not feeling well, can't sleep, has body shakes, and that her blood pressure was low at 97/66. I suggested that she resume taking melatonin to help with her sleep.   She states that she has congestion and her phlegm is white at the moment. I am refilling her hydrocodone and removing her Ambien and Ativan from her med list. I recommend she stay off these medications.   Skin turgor is severely decreased and she will receive 1 liter of IV  normal saline. She has peeling of the skin of her feet.   There is a question of possible early dementia since she has memory impairment.   She wishes to have her port removed and will see Dr. Noberto Retort next month. Her daughter and I discussed the fact that she may occasionally need it for IV fluids until she is more stable.   She denies signs of infection such as sore throat, sinus drainage, cough, or urinary symptoms.  She denies fevers or recurrent chills. She denies pain. She denies nausea, vomiting, chest pain, dyspnea or cough. Her weight has decreased 11 pounds over last month .   SOCIAL HISTORY She lives with her first daughter, who is present at today's visit. She has 2 daughters. She smokes 1.5 packs a day, and has been smoking since 72 yo. She is not sure about quitting. She  did smoke 2 packs per day for over 50 years. She doesn't drink other than rare occasion. She is widowed. She has been married 4 times. She is disabled.   Menstrual History 72 years old when she had her first child She has had 3 children 72 years old when starting menstrual period Menopause started at 29 She has had a partial hysterectomy in her 46's and went back for complete hysterectomy/BSO later. No hormone replacement therapy   FAMILY HISTORY Daughter had breast cancer at age 9 Aunt had breast cancer at age 60 Her sister had a brain tumor at age 33     SURGICAL HISTORY She had a partial Hysterectomy in her late 91's, and went back a few years later to have BSO. Tonsillectomy cataract surgery  Stents of the iliac and femoral arteries. Nerve repair of the left upper extremity   PAST MEDICAL HISTORY Osteoporosis Coronary artery disease COPD Diabetes  Migraine headaches Hyperlipidemia Hypertension TIA Anxiety GERD Peripheral vascular disease Hard of hearing     REVIEW OF SYSTEMS:  Review of Systems  Constitutional:  Positive for unexpected weight change. Negative for appetite change, chills, diaphoresis and fever.  HENT:   Positive for hearing loss. Negative for lump/mass, mouth sores, nosebleeds, sore throat, tinnitus, trouble swallowing and voice change.   Eyes: Negative.  Negative for eye problems and icterus.  Respiratory: Negative.  Negative for chest tightness, cough, hemoptysis, shortness of breath and wheezing.   Cardiovascular:  Positive for chest pain. Negative for leg swelling and palpitations.  Gastrointestinal:  Positive for nausea. Negative for abdominal distention, abdominal pain, blood in stool, constipation, diarrhea, rectal pain and vomiting.  Endocrine: Negative.  Negative for hot flashes.  Genitourinary: Negative.  Negative for difficulty urinating, dyspareunia, dysuria, frequency, hematuria, menstrual problem, vaginal bleeding and vaginal  discharge.   Musculoskeletal:  Positive for back pain and neck pain. Negative for arthralgias, flank pain and gait problem.       Pain in the legs she rates 10/10.  Skin: Negative.  Negative for itching, rash and wound.       Feet are peeling   Neurological: Negative.  Negative for dizziness, extremity weakness, gait problem, headaches, light-headedness, numbness, seizures and speech difficulty.  Hematological: Negative.  Negative for adenopathy. Does not bruise/bleed easily.  Psychiatric/Behavioral:  Positive for sleep disturbance. Negative for depression. The patient is not nervous/anxious.    Marland Kitchen     VITALS:  Blood pressure (!) 112/55, pulse (!) 119, temperature 97.8 F (36.6 C), temperature source Oral, resp. rate 20, height '5\' 1"'$  (1.549 m), weight 137 lb 1.6 oz (62.2 kg), SpO2 96 %.  Wt Readings from Last 3 Encounters:  06/24/22 140 lb (63.5 kg)  06/22/22 132 lb 1.3 oz (59.9 kg)  06/18/22 137 lb 1.6 oz (62.2 kg)    Body mass index is 25.9 kg/m.   Performance status (ECOG): 0 - Asymptomatic   PHYSICAL EXAM:  Physical Exam Vitals reviewed. Exam conducted with a chaperone present.  Constitutional:      General: She is not in acute distress.    Appearance: Normal appearance. She is well-developed and normal weight.  HENT:     Head: Normocephalic and atraumatic.     Right Ear: Ear canal and external ear normal. There is no impacted cerumen.     Left Ear: Tympanic membrane, ear canal and external ear normal. There is no impacted cerumen.     Nose: Nose normal. No congestion or rhinorrhea.     Mouth/Throat:     Mouth: Mucous membranes are moist.     Pharynx: Oropharynx is clear. No oropharyngeal exudate or posterior oropharyngeal erythema.  Eyes:     General: Lids are normal. Vision grossly intact. No scleral icterus.       Right eye: No discharge.        Left eye: No discharge.     Extraocular Movements: Extraocular movements intact.     Conjunctiva/sclera: Conjunctivae  normal.     Pupils: Pupils are equal, round, and reactive to light.  Neck:     Vascular: No carotid bruit.  Cardiovascular:     Rate and Rhythm: Normal rate and regular rhythm.     Pulses: Normal pulses.     Heart sounds: Normal heart sounds. No murmur heard.    No friction rub. No gallop.  Pulmonary:     Effort: Pulmonary effort is normal. No respiratory distress.     Breath sounds: Normal breath sounds.  Chest:  Breasts:    Right: Normal.     Left: Normal.     Comments: She has a healing scar in the lateral left breast Abdominal:     General: Bowel sounds are normal. There is no distension.     Palpations: Abdomen is soft. There is no hepatomegaly, splenomegaly or mass.     Tenderness: There is abdominal tenderness. There is no right CVA tenderness, left CVA tenderness or rebound.     Hernia: No hernia is present.  Musculoskeletal:        General: No swelling, tenderness, deformity or signs of injury. Normal range of motion.     Cervical back: Normal range of motion and neck supple. No rigidity or tenderness.     Right lower leg: No edema.     Left lower leg: No edema.  Lymphadenopathy:     Cervical: No cervical adenopathy.     Right cervical: No superficial, deep or posterior cervical adenopathy.    Left cervical: No superficial, deep or posterior cervical adenopathy.     Upper Body:     Right upper body: No supraclavicular, axillary or pectoral adenopathy.     Left upper body: No supraclavicular, axillary or pectoral adenopathy.  Skin:    General: Skin is warm and dry.     Coloration: Skin is not jaundiced.     Findings: No bruising, erythema or lesion.     Comments: Decreased trugor  Neurological:     General: No focal deficit present.     Mental Status: She is alert and oriented to person, place, and time. Mental status is at baseline.  Psychiatric:  Mood and Affect: Mood normal.        Behavior: Behavior normal. Behavior is cooperative.        Thought  Content: Thought content normal.        Judgment: Judgment normal.         LABS:        Latest Ref Rng & Units 06/18/2022   12:00 AM 05/26/2022   12:00 AM 05/18/2022   12:00 AM  CBC  WBC   11.5     19.1     33.5      Hemoglobin 12.0 - 16.0 12.5     12.2     14.9      Hematocrit 36 - 46 38     37     45      Platelets 150 - 400 K/uL 234     238     247        CBC        Component Ref Range & Units 4 wk ago (07/09/22) 1 mo ago (06/18/22) 2 mo ago (05/26/22) 2 mo ago (05/18/22) 3 mo ago (04/30/22)  Hemoglobin 12.0 - 16.0 10.4 Abnormal  12.5 12.2 14.9 13.9  HCT 36 - 46 31 Abnormal  38 37 45 42  Neutrophils Absolute  9.83 8.97 14.52 22.11 6.28  Platelets 150 - 400 K/uL 239 234 238 247 215  WBC  12.6 11.5       This result is from an external source.        Latest Ref Rng & Units 06/18/2022   12:00 AM 05/26/2022   12:00 AM 05/18/2022   12:00 AM  CMP  BUN 4 - '21 7     15     '$ 37      Creatinine 0.5 - 1.1 0.8     1.0     1.3      Sodium 137 - 147 133     136     138      Potassium 3.5 - 5.1 mEq/L 3.3     3.6     3.0      Chloride 99 - 108 98     104     103      CO2 13 - '22 23     27     21      '$ Calcium 8.7 - 10.7 9.0     8.6     9.0      Total Protein 6.3 - 8.2 g/dL   6        Alkaline Phos 25 - 125 105     71     120      AST 13 - 35 '24     20     31      '$ ALT 7 - 35 U/L '18     18     22         '$ This result is from an external source.    CMP    Component Ref Range & Units 4 wk ago (07/09/22)  Sodium 135 - 145 mmol/L 141  Potassium 3.5 - 5.1 mmol/L 4.4  Chloride 98 - 111 mmol/L 108  CO2 22 - 32 mmol/L 25  Glucose, Bld 70 - 99 mg/dL 184 High     BUN 8 - 23 mg/dL 11  Creatinine 0.44 - 1.00 mg/dL 0.90  Calcium 8.9 - 10.3 mg/dL 8.6 Low   Total Protein 6.5 -  8.1 g/dL 5.4 Low   Albumin 3.5 - 5.0 g/dL 2.8 Low   AST 15 - 41 U/L 15  ALT 0 - 44 U/L 8         Recent Labs  No results found for: "CEA1", "CEA"   /  Last Labs  No results found for: "CEA1", "CEA"    Recent Labs  No results found for: "PSA1"   Recent Labs  No results found for: "OPF292"   Recent Labs  No results found for: "CAN125"    Recent Labs  No results found for: "TOTALPROTELP", "ALBUMINELP", "A1GS", "A2GS", "BETS", "BETA2SER", "GAMS", "MSPIKE", "SPEI"   Recent Labs  No results found for: "TIBC", "FERRITIN", "IRONPCTSAT"   Recent Labs  No results found for: "LDH"       STUDIES:  EXAM:05/05/22 NUCLEAR MEDICINE WHOLE BODY BONE SCAN IMPRESSION: Uptake at LEFT tibial metaphysis especially medially, nonspecific; radiographic correlation recommended to exclude metastatic focus.   Focal abnormal tracer uptake at the LEFT temporo-occipital calvarium, corresponding to and focus of abnormal marrow signal on prior MR head exams back to 2010, nonspecific but likely benign.   No additional scintigraphic findings to suggest osseous metastatic disease.   Diffuse calvarial uptake, favoring metabolic bone disease.    EXAM:05/05/22 CT CHEST WITH CONTRAST IMPRESSION: Ground glass nodule in the right upper lobe with a possible developing internal solid component. Lesion has shown indolent growth from 11/11/2017, at which time it measured approcimately 1.2 x 1.8 cm. Findings are worrisome for adenocarcinoma.  No definitive evidence of metastatic breast cancer. Steatotic cirrhotic liver   EXAM:03/18/2022 REPORT SURGICAL PATHOLOGY DIAGNOSIS Breast, lumpectomy, Left UOQ Invasive ductal carcinoma, grade 3, and ductal carcinoma in situ Ribbon clip and biopsy site present All margins negative for invasive carcinoma and DCIS Fibrocystic changes Lymph node, sentinel, biopsy One lymph node negative for tumor (0/1)   Estrogen receptor: 0% Progesterone receptor: 0% HER2 negative at 1+ Proliferation marker Ki-67: 40%   Bone density scan 01/27/2022 1.  Osteoporosis of the spine with a T score of -2.5, worse 2.  Left femur - osteoporosis with a T score of -2.7, slightly better 3.  Dual  femur total mean-osteoporosis with a T score of -2.5, worse     Allergies:       Allergies  Allergen Reactions   Amoxicillin Hives      vomiting   Celecoxib Hives   Claritin [Loratadine] Other (See Comments)      Pt's daughter notified us of this allergy.   Insulins Nausea And Vomiting      Patient unsure of name of insulin, will call us back   Vioxx [Rofecoxib] Hives   Liraglutide Nausea And Vomiting   Propranolol Nausea And Vomiting      diarrhea diarrhea diarrhea        Current Medications:       Current Outpatient Medications  Medication Sig Dispense Refill   aspirin EC 81 MG tablet Take 81 mg by mouth daily.       atenolol (TENORMIN) 50 MG tablet Take 75 mg by mouth 2 (two) times daily.        busPIRone (BUSPAR) 7.5 MG tablet Take 1 tablet by mouth in the morning, at noon, and at bedtime.       dexamethasone (DECADRON) 4 MG tablet Take 2 tabs by mouth 2 times daily starting day before chemo. Then take 2 tabs daily for 2 days starting day after chemo. Take with food. 30 tablet 1   DULoxetine (CYMBALTA)  60 MG capsule Take 120 mg by mouth at bedtime.        famotidine (PEPCID) 20 MG tablet Take 20 mg by mouth 2 (two) times daily.       fenofibrate (TRICOR) 145 MG tablet Take 145 mg by mouth daily.       fexofenadine (ALLEGRA) 180 MG tablet Take 180 mg by mouth daily.       gabapentin (NEURONTIN) 600 MG tablet Take 600 mg by mouth 3 (three) times daily.       hydrALAZINE (APRESOLINE) 50 MG tablet Take 50 mg by mouth 3 (three) times daily.       lidocaine-prilocaine (EMLA) cream Apply to affected area once 30 g 3   LORazepam (ATIVAN) 0.5 MG tablet Take 1 tablet (0.5 mg total) by mouth at bedtime as needed for sleep. May take one tab prior to imaging studies due to anxiety 30 tablet 0   Melatonin 1 MG CAPS Take by mouth. (Patient not taking: Reported on 04/30/2022)       metoprolol tartrate (LOPRESSOR) 25 MG tablet Take 25 mg by mouth 2 (two) times daily.       nicotine  (NICODERM CQ - DOSED IN MG/24 HOURS) 21 mg/24hr patch Place 1 patch (21 mg total) onto the skin daily. 28 patch 0   nitroGLYCERIN (NITROSTAT) 0.4 MG SL tablet Place 0.4 mg under the tongue every 5 (five) minutes as needed. For chest pain       ondansetron (ZOFRAN-ODT) 4 MG disintegrating tablet Take 4 mg by mouth every 6 (six) hours as needed.       oxyCODONE (OXY IR/ROXICODONE) 5 MG immediate release tablet Take 5 mg by mouth every 6 (six) hours as needed.       pantoprazole (PROTONIX) 40 MG tablet Take 40 mg by mouth 2 (two) times daily.       potassium chloride (KLOR-CON M) 10 MEQ tablet Take 1 tablet (10 mEq total) by mouth daily. 30 tablet 5   prochlorperazine (COMPAZINE) 10 MG tablet Take 1 tablet (10 mg total) by mouth every 6 (six) hours as needed for nausea or vomiting. 30 tablet 1   tirzepatide (MOUNJARO) 2.5 MG/0.5ML Pen INJECT 2.5 MILLIGRAM EVERY WEEK       TRESIBA FLEXTOUCH 100 UNIT/ML FlexTouch Pen Inject into the skin.       valsartan-hydrochlorothiazide (DIOVAN-HCT) 320-25 MG tablet TAKE 1 TABLET BY ORAL ROUTE ONCE DAILY FOR HIGH BLOOD PRESSURE       zolpidem (AMBIEN) 10 MG tablet Take 10 mg by mouth at bedtime as needed for sleep.  (Patient not taking: Reported on 04/30/2022)        No current facility-administered medications for this visit.          I,Gabriella Ballesteros,acting as a scribe for Lindsay Kaplan, MD.,have documented all relevant documentation on the behalf of Lindsay Kaplan, MD,as directed by  Lindsay Kaplan, MD while in the presence of Lindsay Kaplan, MD.

## 2022-09-04 DIAGNOSIS — Z510 Encounter for antineoplastic radiation therapy: Secondary | ICD-10-CM | POA: Diagnosis not present

## 2022-09-04 DIAGNOSIS — Z171 Estrogen receptor negative status [ER-]: Secondary | ICD-10-CM | POA: Diagnosis not present

## 2022-09-04 DIAGNOSIS — C50412 Malignant neoplasm of upper-outer quadrant of left female breast: Secondary | ICD-10-CM | POA: Diagnosis not present

## 2022-09-07 ENCOUNTER — Inpatient Hospital Stay (INDEPENDENT_AMBULATORY_CARE_PROVIDER_SITE_OTHER): Payer: 59 | Admitting: Oncology

## 2022-09-07 ENCOUNTER — Encounter: Payer: Self-pay | Admitting: Oncology

## 2022-09-07 ENCOUNTER — Other Ambulatory Visit: Payer: Self-pay | Admitting: Oncology

## 2022-09-07 ENCOUNTER — Inpatient Hospital Stay: Payer: 59

## 2022-09-07 ENCOUNTER — Telehealth: Payer: Self-pay | Admitting: Oncology

## 2022-09-07 VITALS — BP 127/72 | HR 100 | Temp 97.7°F | Resp 18 | Ht 61.0 in | Wt 129.4 lb

## 2022-09-07 DIAGNOSIS — G620 Drug-induced polyneuropathy: Secondary | ICD-10-CM

## 2022-09-07 DIAGNOSIS — E876 Hypokalemia: Secondary | ICD-10-CM

## 2022-09-07 DIAGNOSIS — Z171 Estrogen receptor negative status [ER-]: Secondary | ICD-10-CM

## 2022-09-07 DIAGNOSIS — Z510 Encounter for antineoplastic radiation therapy: Secondary | ICD-10-CM | POA: Diagnosis not present

## 2022-09-07 DIAGNOSIS — T451X5A Adverse effect of antineoplastic and immunosuppressive drugs, initial encounter: Secondary | ICD-10-CM

## 2022-09-07 DIAGNOSIS — C50412 Malignant neoplasm of upper-outer quadrant of left female breast: Secondary | ICD-10-CM | POA: Diagnosis not present

## 2022-09-07 LAB — COMPREHENSIVE METABOLIC PANEL
Albumin: 3.7 (ref 3.5–5.0)
Calcium: 9.2 (ref 8.7–10.7)

## 2022-09-07 LAB — CBC AND DIFFERENTIAL
HCT: 40 (ref 36–46)
Hemoglobin: 13 (ref 12.0–16.0)
Neutrophils Absolute: 5.31
Platelets: 181 10*3/uL (ref 150–400)
WBC: 7.7

## 2022-09-07 LAB — HEPATIC FUNCTION PANEL
ALT: 10 U/L (ref 7–35)
AST: 16 (ref 13–35)
Alkaline Phosphatase: 79 (ref 25–125)
Bilirubin, Total: 0.3

## 2022-09-07 LAB — CBC: RBC: 4.31 (ref 3.87–5.11)

## 2022-09-07 LAB — BASIC METABOLIC PANEL
BUN: 13 (ref 4–21)
CO2: 27 — AB (ref 13–22)
Chloride: 100 (ref 99–108)
Creatinine: 0.9 (ref 0.5–1.1)
Glucose: 163
Potassium: 3 mEq/L — AB (ref 3.5–5.1)
Sodium: 137 (ref 137–147)

## 2022-09-07 MED ORDER — POTASSIUM CHLORIDE CRYS ER 10 MEQ PO TBCR: 10.0000 meq | EXTENDED_RELEASE_TABLET | Freq: Three times a day (TID) | ORAL | 5 refills | Status: DC

## 2022-09-07 NOTE — Telephone Encounter (Signed)
09/07/22 Next appt scheduled and confirmed with patient

## 2022-09-07 NOTE — Progress Notes (Signed)
Lindsay Brooks  [address] [place],  [place]  [address] [phone]   Clinic Day: 09/07/22    Referring physician: Marco Collie, MD     ASSESSMENT & PLAN:  Stage IA Left breast cancer This is a grade 3 triple negative 21 mm tumor for a T1c N0 M0 with a Ki-67 of 40%. She has had resection with clear margins and negative sentinel node. However, I recommended adjuvant chemotherapy with docetaxol and cytoxan every 3 weeks for 4 cycles to avoid anthracyclines.  She completed this 07/13/22. She is now undergoing adjuvant radiation.   Neuropathy She complains of numbness and pain of both legs and she feels unsteady.   Family history of breast cancer and BRCA positivity Yet she is BRCA negative.   Lung nodule This is a ground glass nodule of the right upper lobe which has shown indolent growth from 1.8 cm in April of 2019 to 2.2 cm currently. We will plan to monitor this with repeat imaging periodically.   Liver cirrhosis Liver is decreased in attenuation diffusely with mildly irregular margins. I don't think she was aware of this diagnosis but it may explain her increased toxicities.   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   Memory impairment This is consistent with early dementia. She also has significant hearing impairment.  Hypokalemia CMP reveals a potassium of 3.0 and so I will order oral and IV potassium to get this corrected. I will have her increase her potassium to 10 meq TID and we will administer 30 meq IV tomorrow.  Plan:  Her CBC is normal and CMP reveals a potassium of 3.0 and so I will order oral and IV potassium to get this corrected. I encouraged her to keep her port in for at least another month, even though she finds it uncomfortable especially at night.  I gave her a note excusing her from wearing a seatbelt until her port is removed. I will see her back in 2 weeks with repeat CBC and CMP for close follow-up. I think she is doing better on the hydration. The patient agreed with the plan and demonstrated an understanding of the instructions.  The patient was advised to call back if the symptoms worsen or if the condition fails to improve as anticipated.     I provided 30 minutes of face-to-face time during this this encounter and > 50% was spent counseling as documented under my assessment and plan.    Lindsay Kaplan, MD [facility] [address] [place] [address] Dept: [phone] Dept Fax: [phone]        CHIEF COMPLAINT:  CC: Invasive ductal carcinoma   Current Treatment:  chemotherapy     HISTORY OF PRESENT ILLNESS:       Oncology History  Breast cancer of upper-outer quadrant of left female breast ([facility])  03/18/2022 Initial Diagnosis    Breast cancer of upper-outer quadrant of  left female breast ([facility])    04/28/2022 Cancer Staging    Staging form: Breast, AJCC 8th Edition - Clinical stage from 04/28/2022: Stage IB (cT1c, cN0(sn), cM0, G3, ER-, PR-, HER2-) - Signed by Lindsay Kaplan, MD on 04/28/2022 Histopathologic type: Infiltrating duct carcinoma, NOS Stage prefix: Initial diagnosis Method of lymph node assessment: Sentinel lymph node biopsy Nuclear grade: G3 Multigene prognostic tests performed: None Histologic grading system: 3 grade system Laterality: Left Tumor size (mm): 20 Lymph-vascular invasion (LVI): LVI not present (absent)/not identified Diagnostic confirmation: Positive histology Specimen type: Excision Staged by: Managing physician Menopausal status: Postmenopausal Ki-67 (%): 40 Stage used in treatment planning: Yes National guidelines used in treatment planning: Yes Type of national guideline used in treatment planning:  NCCN    05/07/2022 -  Chemotherapy    Patient is on Treatment Plan : BREAST TC q21d     05/19/2022 Genetic Testing    Negative hereditary cancer genetic testing: no pathogenic variant detected in [facility] Common Hereditary Cancers +RNA Panel.  Report date is May 19, 2022.     The Common Hereditary Cancers + RNA Panel offered by [facility] includes sequencing, deletion/duplication, and RNA testing of the following 47 genes: APC, ATM, AXIN2, BARD1, BMPR1A, BRCA1, BRCA2, BRIP1, CDH1, CDK4*, CDKN2A (p14ARF)*, CDKN2A (p16INK4a)*, CHEK2, CTNNA1, DICER1, EPCAM (Deletion/duplication testing only), GREM1 (promoter region deletion/duplication testing only), KIT, MEN1, MLH1, MSH2, MSH3, MSH6, MUTYH, NBN, NF1, NHTL1, PALB2, PDGFRA*, PMS2, POLD1, POLE, PTEN, RAD50, RAD51C, RAD51D, SDHB, SDHC, SDHD, SMAD4, SMARCA4. STK11, TP53, TSC1, TSC2, and VHL.  The following genes were evaluated for sequence changes only: SDHA and HOXB13 c.251G>A variant only.  RNA analysis is not performed for the * genes.      Lindsay Brooks is a 72 year old woman who had a screening mammogram on 01/27/2022 and was found to have a possible left breast mass. She does get yearly mammograms due to her strong family history. She thought  she could feel a lump after this was found. She had a diagnostic mammogram on 02/11/2022 with a persistent spiculated hyperdense mass with associated calcifications in the upper inner quadrant. Ultrasound confirmed an irregular hypoechoic mass with peripheral vascularity at 11:30, 3 cm from the nipple. This measures 2.2 cm and the left axilla was negative. She was referred to [name] after a biopsy was done on 02/17/2022, which confirmed a grade 3 invasive ductal carcinoma which is triple negative with a Ki-67 of 40%. [name] took her to surgery on 03/18/2022 for lumpectomy and sentinel lymph node. The final pathology revealed a 20 mm grade 3 invasive ductal carcinoma with DCIS. The margins were clear and the lymph node was  negative for a T1cN0M0, stage IA. She has had a port placed.     INTERVAL HISTORY:  Lindsay Brooks is seen here today for invasive ductal carcinoma for continued supportive care after completing chemotherapy. She reports that she is feeling well. She is slightly dehydrated but is drinking up to 3 bottles of water a day. She inquired about having her port removed. I recommended she keep it in for another month. She now has a wheelchair. Her blood counts and blood sugar are normal however, her potassium is quite low. I will schedule her for 1 liter normal saline and IV potassium and to take 3 potassium pills daily, 78mq. She has been pulled over due to not wearing her seatbelt. She states that it doesn't go over her port well and causes discomfort so I will write her a note. Her CBC is  normal and CMP reveals a potassium of 3.0 and so I will order oral and IV potassium to get this corrected. I will see her back in 2 weeks with repeat CBC and CMP. She denies signs of infection such as sore throat, sinus drainage, cough, or urinary symptoms.  She denies fevers or recurrent chills. She denies pain. She denies nausea, vomiting, chest pain, dyspnea or cough. Her appetite is ok and her weight has decreased 3 pounds over last 2 weeks .  SOCIAL HISTORY She lives with her first daughter, who is present at today's visit. She has 2 daughters. She smokes 1.5 packs a day, and has been smoking since 72 yo. She is not sure about quitting. She did smoke 2 packs per day for over 50 years. She doesn't drink other than rare occasion. She is widowed. She has been married 4 times. She is disabled.   Menstrual History 72 years old when she had her first child She has had 3 children 84 years old when starting menstrual period Menopause started at 63 She has had a partial hysterectomy in her 34's and went back for complete hysterectomy/BSO later. No hormone replacement therapy   FAMILY HISTORY Daughter had breast cancer at age  23 Aunt had breast cancer at age 12 Her sister had a brain tumor at age 13     SURGICAL HISTORY She had a partial Hysterectomy in her late 71's, and went back a few years later to have BSO. Tonsillectomy cataract surgery  Stents of the iliac and femoral arteries. Nerve repair of the left upper extremity   PAST MEDICAL HISTORY Osteoporosis Coronary artery disease COPD Diabetes  Migraine headaches Hyperlipidemia Hypertension TIA Anxiety GERD Peripheral vascular disease Hard of hearing     REVIEW OF SYSTEMS:  Review of Systems  Constitutional: Negative.  Negative for appetite change, chills, diaphoresis, fatigue, fever and unexpected weight change.  HENT:   Positive for hearing loss. Negative for lump/mass, mouth sores, nosebleeds, sore throat, tinnitus, trouble swallowing and voice change.   Eyes: Negative.  Negative for eye problems and icterus.  Respiratory: Negative.  Negative for chest tightness, cough, hemoptysis, shortness of breath and wheezing.   Cardiovascular: Negative.  Negative for chest pain, leg swelling and palpitations.  Gastrointestinal: Negative.  Negative for abdominal distention, abdominal pain, blood in stool, constipation, diarrhea, nausea, rectal pain and vomiting.  Endocrine: Negative.  Negative for hot flashes.  Genitourinary: Negative.  Negative for bladder incontinence, difficulty urinating, dyspareunia, dysuria, frequency, hematuria, menstrual problem, nocturia, pelvic pain, vaginal bleeding and vaginal discharge.   Musculoskeletal: Negative.  Negative for arthralgias, back pain, flank pain, gait problem, myalgias, neck pain and neck stiffness.  Skin: Negative.  Negative for itching, rash and wound.  Neurological: Negative.  Negative for dizziness, extremity weakness, gait problem, headaches, light-headedness, numbness, seizures and speech difficulty.  Hematological: Negative.  Negative for adenopathy. Does not bruise/bleed easily.   Psychiatric/Behavioral:  Positive for sleep disturbance. Negative for confusion, decreased concentration, depression and suicidal ideas. The patient is not nervous/anxious.    Marland Kitchen     VITALS:  Blood pressure (!) 112/55, pulse (!) 119, temperature 97.8 F (36.6 C), temperature source Oral, resp. rate 20, height 5' 1"$  (1.549 m), weight 137 lb 1.6 oz (62.2 kg), SpO2 96 %.     Wt Readings from Last 3 Encounters:  06/24/22 140 lb (63.5 kg)  06/22/22 132 lb 1.3 oz (59.9 kg)  06/18/22 137 lb 1.6 oz (62.2 kg)    Body mass index is  25.9 kg/m.   Performance status (ECOG): 0 - Asymptomatic   PHYSICAL EXAM:  Physical Exam Vitals and nursing note reviewed.  Constitutional:      General: She is not in acute distress.    Appearance: Normal appearance. She is well-developed and normal weight. She is not ill-appearing, toxic-appearing or diaphoretic.  HENT:     Head: Normocephalic and atraumatic.     Right Ear: Tympanic membrane, ear canal and external ear normal. There is no impacted cerumen.     Left Ear: Tympanic membrane, ear canal and external ear normal. There is no impacted cerumen.     Nose: Nose normal. No congestion or rhinorrhea.     Mouth/Throat:     Mouth: Mucous membranes are moist.     Pharynx: Oropharynx is clear. No oropharyngeal exudate or posterior oropharyngeal erythema.  Eyes:     General: Lids are normal. Vision grossly intact. No scleral icterus.       Right eye: No discharge.        Left eye: No discharge.     Extraocular Movements: Extraocular movements intact.     Conjunctiva/sclera: Conjunctivae normal.     Pupils: Pupils are equal, round, and reactive to light.  Neck:     Vascular: No carotid bruit.  Cardiovascular:     Rate and Rhythm: Normal rate and regular rhythm.     Pulses: Normal pulses.     Heart sounds: Normal heart sounds. No murmur heard.    No friction rub. No gallop.  Pulmonary:     Effort: Pulmonary effort is normal. No respiratory distress.      Breath sounds: Normal breath sounds. No stridor. No wheezing, rhonchi or rales.  Chest:     Chest wall: No tenderness.  Breasts:    Right: Normal.     Left: Normal.     Comments: She has a healing scar in the lateral left breast. Markings from radiation and minimal erythema of the left breast. Abdominal:     General: Bowel sounds are normal. There is no distension.     Palpations: Abdomen is soft. There is no hepatomegaly, splenomegaly or mass.     Tenderness: There is no abdominal tenderness. There is no right CVA tenderness, left CVA tenderness, guarding or rebound.     Hernia: No hernia is present.  Musculoskeletal:        General: No swelling, tenderness, deformity or signs of injury. Normal range of motion.     Cervical back: Normal range of motion and neck supple. No rigidity or tenderness.     Right lower leg: No edema.     Left lower leg: No edema.  Lymphadenopathy:     Cervical: No cervical adenopathy.     Right cervical: No superficial, deep or posterior cervical adenopathy.    Left cervical: No superficial, deep or posterior cervical adenopathy.     Upper Body:     Right upper body: No supraclavicular, axillary or pectoral adenopathy.     Left upper body: No supraclavicular, axillary or pectoral adenopathy.  Skin:    General: Skin is warm and dry.     Coloration: Skin is not jaundiced or pale.     Findings: No bruising, erythema, lesion or rash.     Comments: Decreased trugor  Neurological:     General: No focal deficit present.     Mental Status: She is alert and oriented to person, place, and time. Mental status is at baseline.     Cranial  Nerves: No cranial nerve deficit.     Sensory: No sensory deficit.     Motor: No weakness.     Coordination: Coordination normal.     Gait: Gait normal.     Deep Tendon Reflexes: Reflexes normal.  Psychiatric:        Mood and Affect: Mood normal.        Behavior: Behavior normal. Behavior is cooperative.        Thought  Content: Thought content normal.        Judgment: Judgment normal.         LABS:        Latest Ref Rng & Units 06/18/2022   12:00 AM 05/26/2022   12:00 AM 05/18/2022   12:00 AM  CBC  WBC   11.5     19.1     33.5      Hemoglobin 12.0 - 16.0 12.5     12.2     14.9      Hematocrit 36 - 46 38     37     45      Platelets 150 - 400 K/uL 234     238     247                 Ref Range & Units 2 wk ago (08/21/22) 1 mo ago (08/07/22) 2 mo ago (07/09/22) 2 mo ago (06/18/22) 3 mo ago (05/26/22) 3 mo ago (05/18/22) 4 mo ago (04/30/22)   Hemoglobin 12.0 - 16.0 13.2 13.1 10.4 Abnormal  12.5 12.2 14.9 13.9  HCT 36 - 46 40 40 31 Abnormal  38 37 45 42  Neutrophils Absolute  6.44 8.32 9.83 8.97 14.52 22.11 6.28  Platelets 150 - 400 K/uL 210 205 239 234 238 247 215  WBC  9.2 10.4 12.6 11.5 19.1 33.5 10.3  MCV 81 - 99 94         Component Ref Range & Units 2 wk ago (08/21/22) 1 mo ago (08/07/22) 2 mo ago (07/09/22) 2 mo ago (06/18/22) 3 mo ago (05/26/22) 3 mo ago (05/18/22) 4 mo ago (04/30/22)  Glucose  144 153 184 High  R, CM 275 255 119 123  BUN 4 - 21 14 18 11 $ R 7 15 37 Abnormal  11  CO2 13 - 22 18 18 25 $ R 23 Abnormal  27 Abnormal  21 26 Abnormal   Creatinine 0.5 - 1.1 0.9 1.2 Abnormal  0.90 R 0.8 1.0 1.3 Abnormal  1.1  Potassium 3.5 - 5.1 mEq/L 3.0 Abnormal  3.0 Abnormal  4.4 R 3.3 Abnormal  3.6 3.0 Abnormal  3.4 Abnormal   Sodium 137 - 147 138 139 141 R 133 Abnormal  136 Abnormal  138 134 Abnormal   Chloride 99 - 108 104 105 108 R 98 Abnormal  104 103 100             Component Ref Range & Units 2 wk ago (08/21/22) 1 mo ago (08/07/22) 2 mo ago (07/09/22) 2 mo ago (06/18/22) 3 mo ago (05/26/22) 3 mo ago (05/18/22) 4 mo ago (04/30/22)  Calcium 8.7 - 10.7 9.0 9.4 8.6 Low  R 9.0 8.6 Abnormal  9.0 9.3  Albumin 3.5 - 5.0 3.9 4.0 2.8 Low  R 3.7 3.3 Abnormal  3.9 4.0     Component Ref Range & Units 2 wk ago (08/21/22) 1 mo ago (08/07/22) 2 mo ago (07/09/22) 2 mo ago (06/18/22) 3 mo  ago (05/26/22) 3 mo ago (  05/18/22) 4 mo ago (04/30/22)  Alkaline Phosphatase 25 - 125 87 94 58 R 105 71 120 89  ALT 7 - 35 U/L 13 13 8 $ R 18 18 22 16  $ AST 13 - 35 21 20 15 $ R 24 20 31 21    $ Recent Labs  No results found for: "CEA1", "CEA"   /  Last Labs  No results found for: "CEA1", "CEA"   Recent Labs  No results found for: "PSA1"   Recent Labs  No results found for: "[id number]"   Recent Labs  No results found for: "[id number]"    Recent Labs  No results found for: "TOTALPROTELP", "ALBUMINELP", "A1GS", "A2GS", "BETS", "BETA2SER", "GAMS", "MSPIKE", "SPEI"   Recent Labs  No results found for: "TIBC", "FERRITIN", "IRONPCTSAT"   Recent Labs  No results found for: "LDH"    STUDIES:  EXAM:05/05/22 NUCLEAR MEDICINE WHOLE BODY BONE SCAN IMPRESSION: Uptake at LEFT tibial metaphysis especially medially, nonspecific; radiographic correlation recommended to exclude metastatic focus.   Focal abnormal tracer uptake at the LEFT temporo-occipital calvarium, corresponding to and focus of abnormal marrow signal on prior MR head exams back to 2010, nonspecific but likely benign.   No additional scintigraphic findings to suggest osseous metastatic disease.   Diffuse calvarial uptake, favoring metabolic bone disease.    EXAM:05/05/22 CT CHEST WITH CONTRAST IMPRESSION: Ground glass nodule in the right upper lobe with a possible developing internal solid component. Lesion has shown indolent growth from 11/11/2017, at which time it measured approcimately 1.2 x 1.8 cm. Findings are worrisome for adenocarcinoma.  No definitive evidence of metastatic breast cancer. Steatotic cirrhotic liver   EXAM:03/18/2022 REPORT SURGICAL PATHOLOGY DIAGNOSIS Breast, lumpectomy, Left UOQ Invasive ductal carcinoma, grade 3, and ductal carcinoma in situ Ribbon clip and biopsy site present All margins negative for invasive carcinoma and DCIS Fibrocystic changes Lymph node, sentinel, biopsy One lymph node negative  for tumor (0/1)   Estrogen receptor: 0% Progesterone receptor: 0% HER2 negative at 1+ Proliferation marker Ki-67: 40%   Bone density scan 01/27/2022 1.  Osteoporosis of the spine with a T score of -2.5, worse 2.  Left femur - osteoporosis with a T score of -2.7, slightly better 3.  Dual femur total mean-osteoporosis with a T score of -2.5, worse     Allergies:       Allergies  Allergen Reactions   Amoxicillin Hives      vomiting   Celecoxib Hives   Claritin [Loratadine] Other (See Comments)      Pt's daughter notified us of this allergy.   Insulins Nausea And Vomiting      Patient unsure of name of insulin, will call us back   Vioxx [Rofecoxib] Hives   Liraglutide Nausea And Vomiting   Propranolol Nausea And Vomiting      diarrhea diarrhea diarrhea        Current Medications:       Current Outpatient Medications  Medication Sig Dispense Refill   aspirin EC 81 MG tablet Take 81 mg by mouth daily.       atenolol (TENORMIN) 50 MG tablet Take 75 mg by mouth 2 (two) times daily.        busPIRone (BUSPAR) 7.5 MG tablet Take 1 tablet by mouth in the morning, at noon, and at bedtime.       dexamethasone (DECADRON) 4 MG tablet Take 2 tabs by mouth 2 times daily starting day before chemo. Then take 2 tabs daily for 2 days starting day after chemo. Take  with food. 30 tablet 1   DULoxetine (CYMBALTA) 60 MG capsule Take 120 mg by mouth at bedtime.        famotidine (PEPCID) 20 MG tablet Take 20 mg by mouth 2 (two) times daily.       fenofibrate (TRICOR) 145 MG tablet Take 145 mg by mouth daily.       fexofenadine (ALLEGRA) 180 MG tablet Take 180 mg by mouth daily.       gabapentin (NEURONTIN) 600 MG tablet Take 600 mg by mouth 3 (three) times daily.       hydrALAZINE (APRESOLINE) 50 MG tablet Take 50 mg by mouth 3 (three) times daily.       lidocaine-prilocaine (EMLA) cream Apply to affected area once 30 g 3   LORazepam (ATIVAN) 0.5 MG tablet Take 1 tablet (0.5 mg total) by mouth at  bedtime as needed for sleep. May take one tab prior to imaging studies due to anxiety 30 tablet 0   Melatonin 1 MG CAPS Take by mouth. (Patient not taking: Reported on 04/30/2022)       metoprolol tartrate (LOPRESSOR) 25 MG tablet Take 25 mg by mouth 2 (two) times daily.       nicotine (NICODERM CQ - DOSED IN MG/24 HOURS) 21 mg/24hr patch Place 1 patch (21 mg total) onto the skin daily. 28 patch 0   nitroGLYCERIN (NITROSTAT) 0.4 MG SL tablet Place 0.4 mg under the tongue every 5 (five) minutes as needed. For chest pain       ondansetron (ZOFRAN-ODT) 4 MG disintegrating tablet Take 4 mg by mouth every 6 (six) hours as needed.       oxyCODONE (OXY IR/ROXICODONE) 5 MG immediate release tablet Take 5 mg by mouth every 6 (six) hours as needed.       pantoprazole (PROTONIX) 40 MG tablet Take 40 mg by mouth 2 (two) times daily.       potassium chloride (KLOR-CON M) 10 MEQ tablet Take 1 tablet (10 mEq total) by mouth daily. 30 tablet 5   prochlorperazine (COMPAZINE) 10 MG tablet Take 1 tablet (10 mg total) by mouth every 6 (six) hours as needed for nausea or vomiting. 30 tablet 1   tirzepatide (MOUNJARO) 2.5 MG/0.5ML Pen INJECT 2.5 MILLIGRAM EVERY WEEK       TRESIBA FLEXTOUCH 100 UNIT/ML FlexTouch Pen Inject into the skin.       valsartan-hydrochlorothiazide (DIOVAN-HCT) 320-25 MG tablet TAKE 1 TABLET BY ORAL ROUTE ONCE DAILY FOR HIGH BLOOD PRESSURE       zolpidem (AMBIEN) 10 MG tablet Take 10 mg by mouth at bedtime as needed for sleep.  (Patient not taking: Reported on 04/30/2022)        No current facility-administered medications for this visit.         I,Jasmine M Lassiter,acting as a scribe for Lindsay Kaplan, MD.,have documented all relevant documentation on the behalf of Lindsay Kaplan, MD,as directed by  Lindsay Kaplan, MD while in the presence of Lindsay Kaplan, MD.

## 2022-09-08 ENCOUNTER — Inpatient Hospital Stay: Payer: 59

## 2022-09-08 VITALS — BP 120/67 | HR 97 | Temp 97.7°F | Resp 18 | Wt 129.1 lb

## 2022-09-08 DIAGNOSIS — Z510 Encounter for antineoplastic radiation therapy: Secondary | ICD-10-CM | POA: Diagnosis not present

## 2022-09-08 DIAGNOSIS — F17210 Nicotine dependence, cigarettes, uncomplicated: Secondary | ICD-10-CM | POA: Diagnosis not present

## 2022-09-08 DIAGNOSIS — I129 Hypertensive chronic kidney disease with stage 1 through stage 4 chronic kidney disease, or unspecified chronic kidney disease: Secondary | ICD-10-CM | POA: Diagnosis not present

## 2022-09-08 DIAGNOSIS — N189 Chronic kidney disease, unspecified: Secondary | ICD-10-CM | POA: Diagnosis not present

## 2022-09-08 DIAGNOSIS — Z7902 Long term (current) use of antithrombotics/antiplatelets: Secondary | ICD-10-CM | POA: Diagnosis not present

## 2022-09-08 DIAGNOSIS — Z794 Long term (current) use of insulin: Secondary | ICD-10-CM | POA: Diagnosis not present

## 2022-09-08 DIAGNOSIS — R911 Solitary pulmonary nodule: Secondary | ICD-10-CM | POA: Diagnosis not present

## 2022-09-08 DIAGNOSIS — C50412 Malignant neoplasm of upper-outer quadrant of left female breast: Secondary | ICD-10-CM | POA: Diagnosis not present

## 2022-09-08 DIAGNOSIS — Z171 Estrogen receptor negative status [ER-]: Secondary | ICD-10-CM | POA: Diagnosis not present

## 2022-09-08 DIAGNOSIS — Z79899 Other long term (current) drug therapy: Secondary | ICD-10-CM | POA: Diagnosis not present

## 2022-09-08 DIAGNOSIS — T451X5A Adverse effect of antineoplastic and immunosuppressive drugs, initial encounter: Secondary | ICD-10-CM | POA: Diagnosis not present

## 2022-09-08 DIAGNOSIS — E876 Hypokalemia: Secondary | ICD-10-CM | POA: Diagnosis not present

## 2022-09-08 DIAGNOSIS — G620 Drug-induced polyneuropathy: Secondary | ICD-10-CM | POA: Diagnosis not present

## 2022-09-08 DIAGNOSIS — E1122 Type 2 diabetes mellitus with diabetic chronic kidney disease: Secondary | ICD-10-CM | POA: Diagnosis not present

## 2022-09-08 DIAGNOSIS — E860 Dehydration: Secondary | ICD-10-CM | POA: Diagnosis not present

## 2022-09-08 DIAGNOSIS — Z7982 Long term (current) use of aspirin: Secondary | ICD-10-CM | POA: Diagnosis not present

## 2022-09-08 MED ORDER — SODIUM CHLORIDE 0.9 % IV SOLN: Freq: Once | INTRAVENOUS | Status: AC

## 2022-09-08 MED ORDER — POTASSIUM CHLORIDE 10 MEQ/100ML IV SOLN
10.0000 meq | INTRAVENOUS | Status: AC
  Administered 2022-09-08 (×3): 10 meq via INTRAVENOUS
  Filled 2022-09-08 (×3): qty 100

## 2022-09-08 MED ORDER — HEPARIN SOD (PORK) LOCK FLUSH 100 UNIT/ML IV SOLN
500.0000 [IU] | Freq: Once | INTRAVENOUS | Status: AC | PRN
  Administered 2022-09-08: 500 [IU]

## 2022-09-08 MED ORDER — SODIUM CHLORIDE 0.9% FLUSH
10.0000 mL | INTRAVENOUS | Status: DC | PRN
  Administered 2022-09-08: 10 mL

## 2022-09-08 NOTE — Patient Instructions (Signed)
Hypokalemia Hypokalemia means that the amount of potassium in the blood is lower than normal. Potassium is a mineral (electrolyte) that helps regulate the amount of fluid in the body. It also stimulates muscle tightening (contraction) and helps nerves work properly. Normally, most of the body's potassium is inside cells, and only a very small amount is in the blood. Because the amount in the blood is so small, minor changes to potassium levels in the blood can be life-threatening. What are the causes? This condition may be caused by: Antibiotic medicine. Diarrhea or vomiting. Taking too much of a medicine that helps you have a bowel movement (laxative) can cause diarrhea and lead to hypokalemia. Chronic kidney disease (CKD). Medicines that help the body get rid of excess fluid (diuretics). Eating disorders, such as anorexia or bulimia. Low magnesium levels in the body. Sweating a lot. What are the signs or symptoms? Symptoms of this condition include: Weakness. Constipation. Fatigue. Muscle cramps. Mental confusion. Skipped heartbeats or irregular heartbeat (palpitations). Tingling or numbness. How is this diagnosed? This condition is diagnosed with a blood test. How is this treated? This condition may be treated by: Taking potassium supplements. Adjusting the medicines that you take. Eating more foods that contain a lot of potassium. If your potassium level is very low, you may need to get potassium through an IV and be monitored in the hospital. Follow these instructions at home: Eating and drinking  Eat a healthy diet. A healthy diet includes fresh fruits and vegetables, whole grains, healthy fats, and lean proteins. If told, eat more foods that contain a lot of potassium. These include: Nuts, such as peanuts and pistachios. Seeds, such as sunflower seeds and pumpkin seeds. Peas, lentils, and lima beans. Whole grain and bran cereals and breads. Fresh fruits and vegetables,  such as apricots, avocado, bananas, cantaloupe, kiwi, oranges, tomatoes, asparagus, and potatoes. Juices, such as orange, tomato, and prune. Lean meats, including fish. Milk and milk products, such as yogurt. General instructions Take over-the-counter and prescription medicines only as told by your health care provider. This includes vitamins, natural food products, and supplements. Keep all follow-up visits. This is important. Contact a health care provider if: You have weakness that gets worse. You feel your heart pounding or racing. You vomit. You have diarrhea. You have diabetes and you have trouble keeping your blood sugar in your target range. Get help right away if: You have chest pain. You have shortness of breath. You have vomiting or diarrhea that lasts for more than 2 days. You faint. These symptoms may be an emergency. Get help right away. Call 911. Do not wait to see if the symptoms will go away. Do not drive yourself to the hospital. Summary Hypokalemia means that the amount of potassium in the blood is lower than normal. This condition is diagnosed with a blood test. Hypokalemia may be treated by taking potassium supplements, adjusting the medicines that you take, or eating more foods that are high in potassium. If your potassium level is very low, you may need to get potassium through an IV and be monitored in the hospital. This information is not intended to replace advice given to you by your health care provider. Make sure you discuss any questions you have with your health care provider. Document Revised: 04/10/2021 Document Reviewed: 04/10/2021 [employer]  [employer].

## 2022-09-09 DIAGNOSIS — C50412 Malignant neoplasm of upper-outer quadrant of left female breast: Secondary | ICD-10-CM | POA: Diagnosis not present

## 2022-09-09 DIAGNOSIS — Z510 Encounter for antineoplastic radiation therapy: Secondary | ICD-10-CM | POA: Diagnosis not present

## 2022-09-10 DIAGNOSIS — E1169 Type 2 diabetes mellitus with other specified complication: Secondary | ICD-10-CM | POA: Diagnosis not present

## 2022-09-10 DIAGNOSIS — Z510 Encounter for antineoplastic radiation therapy: Secondary | ICD-10-CM | POA: Diagnosis not present

## 2022-09-10 DIAGNOSIS — I10 Essential (primary) hypertension: Secondary | ICD-10-CM | POA: Diagnosis not present

## 2022-09-10 DIAGNOSIS — E785 Hyperlipidemia, unspecified: Secondary | ICD-10-CM | POA: Diagnosis not present

## 2022-09-10 DIAGNOSIS — C50412 Malignant neoplasm of upper-outer quadrant of left female breast: Secondary | ICD-10-CM | POA: Diagnosis not present

## 2022-09-11 ENCOUNTER — Other Ambulatory Visit: Payer: Self-pay | Admitting: Oncology

## 2022-09-11 DIAGNOSIS — Z510 Encounter for antineoplastic radiation therapy: Secondary | ICD-10-CM | POA: Diagnosis not present

## 2022-09-11 DIAGNOSIS — Z171 Estrogen receptor negative status [ER-]: Secondary | ICD-10-CM | POA: Diagnosis not present

## 2022-09-11 DIAGNOSIS — C50412 Malignant neoplasm of upper-outer quadrant of left female breast: Secondary | ICD-10-CM | POA: Diagnosis not present

## 2022-09-14 DIAGNOSIS — C50412 Malignant neoplasm of upper-outer quadrant of left female breast: Secondary | ICD-10-CM | POA: Diagnosis not present

## 2022-09-14 DIAGNOSIS — Z510 Encounter for antineoplastic radiation therapy: Secondary | ICD-10-CM | POA: Diagnosis not present

## 2022-09-15 DIAGNOSIS — C50412 Malignant neoplasm of upper-outer quadrant of left female breast: Secondary | ICD-10-CM | POA: Diagnosis not present

## 2022-09-15 DIAGNOSIS — Z510 Encounter for antineoplastic radiation therapy: Secondary | ICD-10-CM | POA: Diagnosis not present

## 2022-09-16 DIAGNOSIS — Z510 Encounter for antineoplastic radiation therapy: Secondary | ICD-10-CM | POA: Diagnosis not present

## 2022-09-16 DIAGNOSIS — C50412 Malignant neoplasm of upper-outer quadrant of left female breast: Secondary | ICD-10-CM | POA: Diagnosis not present

## 2022-09-16 NOTE — Progress Notes (Signed)
Lindsay Brooks  [address] [place],  [place]  [address] [phone]   Clinic Day:09/21/22     Referring physician: Marco Collie, MD     ASSESSMENT & PLAN:  Stage IA Left breast cancer This is a grade 3 triple negative 21 mm tumor for a T1cN0M0 with a Ki-67 of 40%. She has had resection with clear margins and negative sentinel node. However, I recommended adjuvant chemotherapy with docetaxol and cyclophosphamide every 3 weeks for 4 cycles to avoid anthracyclines.  She has now completed this 07/13/22. She is now undergoing adjuvant radiation.   Neuropathy She complains of numbness and pain of both legs and she feels unsteady.   Family history of breast cancer and BRCA positivity Yet she is BRCA negative.   Lung nodule This is a ground glass nodule of the right upper lobe which has shown indolent growth from 1.8 cm in April of 2019 to 2.2 cm currently. We will plan to monitor this with repeat imaging periodically.   Liver cirrhosis Liver is decreased in attenuation diffusely with mildly irregular margins. I don't think she was aware of this diagnosis but it may explain her increased toxicities.   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   Memory impairment This is consistent with early dementia. She also has significant hearing impairment.   Hypokalemia CMP reveals a potassium of 3.6, up from 3.0 and so I will order a small amount of  IV potassium to get this corrected. She will continue potassium to 31mq TID.   Plan:  Her CBC and CMP are normal. Her BP is low at 109/54 but she is scheduled for an infusion tomorrow morning. She has 10 more radiation treatments. She will be back in 1 week for recheck with CBC, CMP, and probable IV fluids. I will see her again in 2 weeks with probably IV fluids. The patient agreed with the plan and demonstrated  an understanding of the instructions.  The patient was advised to call back if the symptoms worsen or if the condition fails to improve as anticipated.     I provided 30 minutes of face-to-face time during this this encounter and > 50% was spent counseling as documented under my assessment and plan.    CDerwood Kaplan MD CLaguna[address] Parker Stre[id number]: [phone] Fax: [phone]       CHIEF COMPLAINT:  CC: Invasive ductal carcinoma   Current Treatment:  chemotherapy     HISTORY OF PRESENT ILLNESS:       Oncology History  Breast cancer of upper-outer quadrant of left female breast ([facility]  03/18/2022 Initial Diagnosis    Breast cancer of upper-outer quadrant of left female breast ([facility]    04/28/2022 Cancer Staging    Staging form: Breast, AJCC 8th Edition - Clinical stage from 04/28/2022: Stage IB (cT1c, cN0(sn), cM0, G3, ER-, PR-, HER2-) - Signed by MDerwood Kaplan MD on 04/28/2022 Histopathologic type: Infiltrating duct carcinoma, NOS Stage prefix: Initial diagnosis Method of lymph node assessment: Sentinel lymph node biopsy Nuclear grade: G3 Multigene prognostic tests performed: None Histologic grading system: 3 grade system Laterality: Left Tumor size (mm): 20 Lymph-vascular invasion (LVI): LVI not present (absent)/not identified Diagnostic confirmation: Positive histology Specimen type: Excision Staged by: Managing physician Menopausal status: Postmenopausal Ki-67 (%): 40 Stage used in treatment  planning: Yes National guidelines used in treatment planning: Yes Type of national guideline used in treatment planning: NCCN    05/07/2022 -  Chemotherapy    Patient is on Treatment Plan : BREAST TC q21d     05/19/2022 Genetic Testing    Negative hereditary cancer genetic testing: no pathogenic variant detected in [facility] Common Hereditary Cancers +RNA Panel.  Report  date is May 19, 2022.     The Common Hereditary Cancers + RNA Panel offered by [facility] includes sequencing, deletion/duplication, and RNA testing of the following 47 genes: APC, ATM, AXIN2, BARD1, BMPR1A, BRCA1, BRCA2, BRIP1, CDH1, CDK4*, CDKN2A (p14ARF)*, CDKN2A (p16INK4a)*, CHEK2, CTNNA1, DICER1, EPCAM (Deletion/duplication testing only), GREM1 (promoter region deletion/duplication testing only), KIT, MEN1, MLH1, MSH2, MSH3, MSH6, MUTYH, NBN, NF1, NHTL1, PALB2, PDGFRA*, PMS2, POLD1, POLE, PTEN, RAD50, RAD51C, RAD51D, SDHB, SDHC, SDHD, SMAD4, SMARCA4. STK11, TP53, TSC1, TSC2, and VHL.  The following genes were evaluated for sequence changes only: SDHA and HOXB13 c.251G>A variant only.  RNA analysis is not performed for the * genes.      Lindsay Brooks is a 72 year old woman who had a screening mammogram on 01/27/2022 and was found to have a possible left breast mass. She does get yearly mammograms due to her strong family history. She thought  she could feel a lump after this was found. She had a diagnostic mammogram on 02/11/2022 with a persistent spiculated hyperdense mass with associated calcifications in the upper inner quadrant. Ultrasound confirmed an irregular hypoechoic mass with peripheral vascularity at 11:30, 3 cm from the nipple. This measures 2.2 cm and the left axilla was negative. She was referred to [name] after a biopsy was done on 02/17/2022, which confirmed a grade 3 invasive ductal carcinoma which is triple negative with a Ki-67 of 40%. [name] took her to surgery on 03/18/2022 for lumpectomy and sentinel lymph node. The final pathology revealed a 20 mm grade 3 invasive ductal carcinoma with DCIS. The margins were clear and the lymph node was negative for a T1cN0M0, stage IA. She has had a port placed.     INTERVAL HISTORY:  Lindsay Brooks is seen here today for invasive ductal carcinoma for continued supportive care after completing chemotherapy. Patient states that she feels ok and has  lightheadiness and had a stomach virus, but she is better now. Her CMP and CBC are normal. Her BP is low at 109/54 but she is scheduled for an infusion tomorrow morning. Her daughter informed me that her mom has 10 more radiation treatments. She will be back in 1 week for recheck with CBC, CMP, and probable IV fluids. I will see her again in 2 weeks with probable IV fluids. She denies signs of infection such as sore throat, sinus drainage, cough, or urinary symptoms.  She denies fevers or recurrent chills. She denies pain. She denies nausea, vomiting, chest pain, dyspnea or cough. She has no appetite.  Her  weight has decreased 3 pounds over last 2 weeks .She is accompanied by her daughter at today's visit.   HISTORY:  SOCIAL HISTORY She lives with her first daughter. She has 2 daughters. She smokes 1.5 packs a day, and has been smoking since 72 yo. She is not sure about quitting. She did smoke 2 packs per day for over 50 years. She doesn't drink other than rare occasion. She is widowed. She has been married 4 times. She is disabled.   Menstrual History 72 years old when she had her first child She has had 3  children 3 years old when starting menstrual period Menopause started at 75 She has had a partial hysterectomy in her 18's and went back for complete hysterectomy/BSO later. No hormone replacement therapy   FAMILY HISTORY Daughter had breast cancer at age 103 Aunt had breast cancer at age 45 Her sister had a brain tumor at age 74     SURGICAL HISTORY She had a partial Hysterectomy in her late 38's, and went back a few years later to have BSO. Tonsillectomy cataract surgery  Stents of the iliac and femoral arteries. Nerve repair of the left upper extremity   PAST MEDICAL HISTORY Osteoporosis Coronary artery disease COPD Diabetes  Migraine headaches Hyperlipidemia Hypertension TIA Anxiety GERD Peripheral vascular disease Hard of hearing     REVIEW OF SYSTEMS:  Review of  Systems  Constitutional:  Positive for appetite change. Negative for chills, diaphoresis, fatigue, fever and unexpected weight change.  HENT:   Positive for hearing loss. Negative for lump/mass, mouth sores, nosebleeds, sore throat, tinnitus, trouble swallowing and voice change.   Eyes: Negative.  Negative for eye problems and icterus.  Respiratory: Negative.  Negative for chest tightness, cough, hemoptysis, shortness of breath and wheezing.   Cardiovascular: Negative.  Negative for chest pain, leg swelling and palpitations.  Gastrointestinal: Negative.  Negative for abdominal distention, abdominal pain, blood in stool, constipation, diarrhea, nausea, rectal pain and vomiting.  Endocrine: Negative.  Negative for hot flashes.  Genitourinary: Negative.  Negative for bladder incontinence, difficulty urinating, dyspareunia, dysuria, frequency, hematuria, menstrual problem, nocturia, pelvic pain, vaginal bleeding and vaginal discharge.   Musculoskeletal: Negative.  Negative for arthralgias, back pain, flank pain, gait problem, myalgias, neck pain and neck stiffness.  Skin: Negative.  Negative for itching, rash and wound.  Neurological:  Positive for light-headedness. Negative for dizziness, extremity weakness, gait problem, headaches, numbness, seizures and speech difficulty.  Hematological: Negative.  Negative for adenopathy. Does not bruise/bleed easily.  Psychiatric/Behavioral:  Positive for sleep disturbance. Negative for confusion, decreased concentration, depression and suicidal ideas. The patient is not nervous/anxious.    VITALS:  Blood pressure (!) 112/55, pulse (!) 119, temperature 97.8 F (36.6 C), temperature source Oral, resp. rate 20, height 5' 1"$  (1.549 m), weight 137 lb 1.6 oz (62.2 kg), SpO2 96 %.     Wt Readings from Last 3 Encounters:  06/24/22 140 lb (63.5 kg)  06/22/22 132 lb 1.3 oz (59.9 kg)  06/18/22 137 lb 1.6 oz (62.2 kg)    Body mass index is 25.9 kg/m.   Performance  status (ECOG): 0 - Asymptomatic   PHYSICAL EXAM:  Physical Exam Vitals and nursing note reviewed. Exam conducted with a chaperone present.  Constitutional:      General: She is not in acute distress.    Appearance: Normal appearance. She is well-developed and normal weight. She is not ill-appearing, toxic-appearing or diaphoretic.  HENT:     Head: Normocephalic and atraumatic.     Right Ear: Tympanic membrane, ear canal and external ear normal. There is no impacted cerumen.     Left Ear: Tympanic membrane, ear canal and external ear normal. There is no impacted cerumen.     Nose: Nose normal. No congestion or rhinorrhea.     Mouth/Throat:     Mouth: Mucous membranes are moist.     Pharynx: Oropharynx is clear. No oropharyngeal exudate or posterior oropharyngeal erythema.  Eyes:     General: Lids are normal. Vision grossly intact. No scleral icterus.       Right  eye: No discharge.        Left eye: No discharge.     Extraocular Movements: Extraocular movements intact.     Conjunctiva/sclera: Conjunctivae normal.     Pupils: Pupils are equal, round, and reactive to light.  Neck:     Vascular: No carotid bruit.  Cardiovascular:     Rate and Rhythm: Normal rate and regular rhythm.     Pulses: Normal pulses.     Heart sounds: Normal heart sounds. No murmur heard.    No friction rub. No gallop.  Pulmonary:     Effort: Pulmonary effort is normal. No respiratory distress.     Breath sounds: Normal breath sounds. No stridor. No wheezing, rhonchi or rales.  Chest:     Chest wall: No tenderness.  Breasts:    Right: Normal.     Left: Normal.     Comments: The left breast has extensive radiation changes and hyperpigmentation but no peeling. There are no masses in either breast. Abdominal:     General: Bowel sounds are normal. There is no distension.     Palpations: Abdomen is soft. There is no hepatomegaly, splenomegaly or mass.     Tenderness: There is no abdominal tenderness. There is  no right CVA tenderness, left CVA tenderness, guarding or rebound.     Hernia: No hernia is present.  Musculoskeletal:        General: No swelling, tenderness, deformity or signs of injury. Normal range of motion.     Cervical back: Normal range of motion and neck supple. No rigidity or tenderness.     Right lower leg: No edema.     Left lower leg: No edema.  Lymphadenopathy:     Cervical: No cervical adenopathy.     Right cervical: No superficial, deep or posterior cervical adenopathy.    Left cervical: No superficial, deep or posterior cervical adenopathy.     Upper Body:     Right upper body: No supraclavicular, axillary or pectoral adenopathy.     Left upper body: No supraclavicular, axillary or pectoral adenopathy.  Skin:    General: Skin is warm and dry.     Coloration: Skin is not jaundiced or pale.     Findings: No bruising, erythema, lesion or rash.     Comments: Decreased trugor  Neurological:     General: No focal deficit present.     Mental Status: She is alert and oriented to person, place, and time. Mental status is at baseline.     Cranial Nerves: No cranial nerve deficit.     Sensory: No sensory deficit.     Motor: No weakness.     Coordination: Coordination normal.     Gait: Gait normal.     Deep Tendon Reflexes: Reflexes normal.  Psychiatric:        Mood and Affect: Mood normal.        Behavior: Behavior normal. Behavior is cooperative.        Thought Content: Thought content normal.        Judgment: Judgment normal.         LABS:        Latest Ref Rng & Units 06/18/2022   12:00 AM 05/26/2022   12:00 AM 05/18/2022   12:00 AM  CBC  WBC   11.5     19.1     33.5      Hemoglobin 12.0 - 16.0 12.5     12.2     14.9  Hematocrit 36 - 46 38     37     45      Platelets 150 - 400 K/uL 234     238     247               Component Ref Range & Units 2 wk ago (09/07/22) 1 mo ago (08/21/22) 1 mo ago (08/07/22) 2 mo ago (07/09/22)  Hemoglobin 12.0 - 16.0 13.0  13.2 13.1 10.4 Abnormal   HCT 36 - 46 40 40 40 31 Abnormal   Neutrophils Absolute  5.31 6.44 8.32 9.83  Platelets 150 - 400 K/uL 181 210 205 239  WBC  7.7 9.2 10.4 12.6    This result is from an external source.     Component Ref Range & Units 9 d ago (09/07/22) 3 wk ago (08/21/22) 1 mo ago (08/07/22)  Glucose  163 144 153  BUN 4 - 21 13 14 18  $ CO2 13 - 22 27 Abnormal  18 18  Creatinine 0.5 - 1.1 0.9 0.9 1.2 Abnormal   Potassium 3.5 - 5.1 mEq/L 3.0 Abnormal  3.0 Abnormal  3.0 Abnormal   Sodium 137 - 147 137 138 139  Chloride 99 - 108 100 104    Component Ref Range & Units 9 d ago (09/07/22) 3 wk ago (08/21/22) 1 mo ago (08/07/22) 2 mo ago (07/09/22)  Calcium 8.7 - 10.7 9.2 9.0 9.4 8.6 Low  R  Albumin 3.5 - 5.0 3.7 3.9 4.0      Component Ref Range & Units 9 d ago (09/07/22) 3 wk ago (08/21/22) 1 mo ago (08/07/22)  Alkaline Phosphatase 25 - 125 79 87 94  ALT 7 - 35 U/L 10 13 13  $ AST 13 - 35 16 21 20  $ Bilirubin, Total  0.3 0.4 0.6       Latest Ref Rng & Units 06/18/2022   12:00 AM 05/26/2022   12:00 AM 05/18/2022   12:00 AM  CMP  BUN 4 - 21 7     15     $ 37      Creatinine 0.5 - 1.1 0.8     1.0     1.3      Sodium 137 - 147 133     136     138      Potassium 3.5 - 5.1 mEq/L 3.3     3.6     3.0      Chloride 99 - 108 98     104     103      CO2 13 - 22 23     27     21      $ Calcium 8.7 - 10.7 9.0     8.6     9.0      Total Protein 6.3 - 8.2 g/dL   6        Alkaline Phos 25 - 125 105     71     120      AST 13 - 35 24     20     31      $ ALT 7 - 35 U/L 18     18     22         $ This result is from an external source.    CMP    Component Ref Range & Units 4 wk ago (07/09/22)  Sodium 135 - 145 mmol/L 141  Potassium 3.5 - 5.1 mmol/L  4.4  Chloride 98 - 111 mmol/L 108  CO2 22 - 32 mmol/L 25  Glucose, Bld 70 - 99 mg/dL 184 High     BUN 8 - 23 mg/dL 11  Creatinine 0.44 - 1.00 mg/dL 0.90  Calcium 8.9 - 10.3 mg/dL 8.6 Low   Total Protein 6.5 - 8.1 g/dL 5.4 Low   Albumin  3.5 - 5.0 g/dL 2.8 Low   AST 15 - 41 U/L 15  ALT 0 - 44 U/L 8       Recent Labs  No results found for: "CEA1", "CEA"   /  Last Labs  No results found for: "CEA1", "CEA"   Recent Labs  No results found for: "PSA1"   Recent Labs  No results found for: "[id number]"   Recent Labs  No results found for: "[id number]"    Recent Labs  No results found for: "TOTALPROTELP", "ALBUMINELP", "A1GS", "A2GS", "BETS", "BETA2SER", "GAMS", "MSPIKE", "SPEI"   Recent Labs  No results found for: "TIBC", "FERRITIN", "IRONPCTSAT"   Recent Labs  No results found for: "LDH"     STUDIES:  EXAM:05/05/22 NUCLEAR MEDICINE WHOLE BODY BONE SCAN IMPRESSION: Uptake at LEFT tibial metaphysis especially medially, nonspecific; radiographic correlation recommended to exclude metastatic focus.   Focal abnormal tracer uptake at the LEFT temporo-occipital calvarium, corresponding to and focus of abnormal marrow signal on prior MR head exams back to 2010, nonspecific but likely benign.   No additional scintigraphic findings to suggest osseous metastatic disease.   Diffuse calvarial uptake, favoring metabolic bone disease.    EXAM:05/05/22 CT CHEST WITH CONTRAST IMPRESSION: Ground glass nodule in the right upper lobe with a possible developing internal solid component. Lesion has shown indolent growth from 11/11/2017, at which time it measured approcimately 1.2 x 1.8 cm. Findings are worrisome for adenocarcinoma.  No definitive evidence of metastatic breast cancer. Steatotic cirrhotic liver   EXAM:03/18/2022 REPORT SURGICAL PATHOLOGY DIAGNOSIS Breast, lumpectomy, Left UOQ Invasive ductal carcinoma, grade 3, and ductal carcinoma in situ Ribbon clip and biopsy site present All margins negative for invasive carcinoma and DCIS Fibrocystic changes Lymph node, sentinel, biopsy One lymph node negative for tumor (0/1)   Estrogen receptor: 0% Progesterone receptor: 0% HER2 negative at 1+ Proliferation marker Ki-67:  40%   Bone density scan 01/27/2022 1.  Osteoporosis of the spine with a T score of -2.5, worse 2.  Left femur - osteoporosis with a T score of -2.7, slightly better 3.  Dual femur total mean-osteoporosis with a T score of -2.5, worse     Allergies:       Allergies  Allergen Reactions   Amoxicillin Hives      vomiting   Celecoxib Hives   Claritin [Loratadine] Other (See Comments)      Pt's daughter notified us of this allergy.   Insulins Nausea And Vomiting      Patient unsure of name of insulin, will call us back   Vioxx [Rofecoxib] Hives   Liraglutide Nausea And Vomiting   Propranolol Nausea And Vomiting      diarrhea diarrhea diarrhea      Current Medications:       Current Outpatient Medications  Medication Sig Dispense Refill   aspirin EC 81 MG tablet Take 81 mg by mouth daily.       atenolol (TENORMIN) 50 MG tablet Take 75 mg by mouth 2 (two) times daily.        busPIRone (BUSPAR) 7.5 MG tablet Take 1 tablet by mouth in the morning, at  noon, and at bedtime.       dexamethasone (DECADRON) 4 MG tablet Take 2 tabs by mouth 2 times daily starting day before chemo. Then take 2 tabs daily for 2 days starting day after chemo. Take with food. 30 tablet 1   DULoxetine (CYMBALTA) 60 MG capsule Take 120 mg by mouth at bedtime.        famotidine (PEPCID) 20 MG tablet Take 20 mg by mouth 2 (two) times daily.       fenofibrate (TRICOR) 145 MG tablet Take 145 mg by mouth daily.       fexofenadine (ALLEGRA) 180 MG tablet Take 180 mg by mouth daily.       gabapentin (NEURONTIN) 600 MG tablet Take 600 mg by mouth 3 (three) times daily.       hydrALAZINE (APRESOLINE) 50 MG tablet Take 50 mg by mouth 3 (three) times daily.       lidocaine-prilocaine (EMLA) cream Apply to affected area once 30 g 3   LORazepam (ATIVAN) 0.5 MG tablet Take 1 tablet (0.5 mg total) by mouth at bedtime as needed for sleep. May take one tab prior to imaging studies due to anxiety 30 tablet 0   Melatonin 1 MG CAPS  Take by mouth. (Patient not taking: Reported on 04/30/2022)       metoprolol tartrate (LOPRESSOR) 25 MG tablet Take 25 mg by mouth 2 (two) times daily.       nicotine (NICODERM CQ - DOSED IN MG/24 HOURS) 21 mg/24hr patch Place 1 patch (21 mg total) onto the skin daily. 28 patch 0   nitroGLYCERIN (NITROSTAT) 0.4 MG SL tablet Place 0.4 mg under the tongue every 5 (five) minutes as needed. For chest pain       ondansetron (ZOFRAN-ODT) 4 MG disintegrating tablet Take 4 mg by mouth every 6 (six) hours as needed.       oxyCODONE (OXY IR/ROXICODONE) 5 MG immediate release tablet Take 5 mg by mouth every 6 (six) hours as needed.       pantoprazole (PROTONIX) 40 MG tablet Take 40 mg by mouth 2 (two) times daily.       potassium chloride (KLOR-CON M) 10 MEQ tablet Take 1 tablet (10 mEq total) by mouth daily. 30 tablet 5   prochlorperazine (COMPAZINE) 10 MG tablet Take 1 tablet (10 mg total) by mouth every 6 (six) hours as needed for nausea or vomiting. 30 tablet 1   tirzepatide (MOUNJARO) 2.5 MG/0.5ML Pen INJECT 2.5 MILLIGRAM EVERY WEEK       TRESIBA FLEXTOUCH 100 UNIT/ML FlexTouch Pen Inject into the skin.       valsartan-hydrochlorothiazide (DIOVAN-HCT) 320-25 MG tablet TAKE 1 TABLET BY ORAL ROUTE ONCE DAILY FOR HIGH BLOOD PRESSURE       zolpidem (AMBIEN) 10 MG tablet Take 10 mg by mouth at bedtime as needed for sleep.  (Patient not taking: Reported on 04/30/2022)        No current facility-administered medications for this visit.         I,Jasmine M Lassiter,acting as a scribe for Derwood Kaplan, MD.,have documented all relevant documentation on the behalf of Derwood Kaplan, MD,as directed by  Derwood Kaplan, MD while in the presence of Derwood Kaplan, MD.

## 2022-09-17 DIAGNOSIS — C50412 Malignant neoplasm of upper-outer quadrant of left female breast: Secondary | ICD-10-CM | POA: Diagnosis not present

## 2022-09-17 DIAGNOSIS — Z171 Estrogen receptor negative status [ER-]: Secondary | ICD-10-CM | POA: Diagnosis not present

## 2022-09-17 DIAGNOSIS — Z510 Encounter for antineoplastic radiation therapy: Secondary | ICD-10-CM | POA: Diagnosis not present

## 2022-09-18 ENCOUNTER — Telehealth: Payer: Self-pay

## 2022-09-18 ENCOUNTER — Emergency Department (HOSPITAL_COMMUNITY): Admission: EM | Admit: 2022-09-18 | Payer: 59

## 2022-09-18 NOTE — Telephone Encounter (Signed)
-----   Message from Derwood Kaplan, MD sent at 09/18/2022 12:18 PM EST ----- Regarding: RE: Nausea, vomiting and diarrhea Well at least that is in the morning in case we need IV fluids, did we sched infusion appt? I am on call if she gets into trouble on the weekend ----- Message ----- From: Georgette Shell, RN Sent: 09/18/2022  10:05 AM EST To: Derwood Kaplan, MD Subject: Nausea, vomiting and diarrhea                  Sierena, patient daughter, called to let us know that Ms Barbian will not be coming to treatment today.  I had spoke with [place] yesterday about coming in today to get labs and maybe fluids.  They declined to come today stating that they would just keep Monday's appt for labs and to see you.

## 2022-09-20 ENCOUNTER — Encounter: Payer: Self-pay | Admitting: Oncology

## 2022-09-21 ENCOUNTER — Encounter: Payer: Self-pay | Admitting: Oncology

## 2022-09-21 ENCOUNTER — Inpatient Hospital Stay: Payer: 59

## 2022-09-21 ENCOUNTER — Inpatient Hospital Stay: Payer: 59 | Attending: Hematology and Oncology | Admitting: Oncology

## 2022-09-21 ENCOUNTER — Other Ambulatory Visit: Payer: Self-pay | Admitting: Oncology

## 2022-09-21 VITALS — BP 109/54 | HR 132 | Temp 97.9°F | Resp 18 | Ht 61.0 in | Wt 126.0 lb

## 2022-09-21 DIAGNOSIS — C50412 Malignant neoplasm of upper-outer quadrant of left female breast: Secondary | ICD-10-CM | POA: Diagnosis not present

## 2022-09-21 DIAGNOSIS — E876 Hypokalemia: Secondary | ICD-10-CM | POA: Insufficient documentation

## 2022-09-21 DIAGNOSIS — Z79899 Other long term (current) drug therapy: Secondary | ICD-10-CM | POA: Insufficient documentation

## 2022-09-21 DIAGNOSIS — Z7952 Long term (current) use of systemic steroids: Secondary | ICD-10-CM | POA: Insufficient documentation

## 2022-09-21 DIAGNOSIS — Z803 Family history of malignant neoplasm of breast: Secondary | ICD-10-CM | POA: Insufficient documentation

## 2022-09-21 DIAGNOSIS — Z90710 Acquired absence of both cervix and uterus: Secondary | ICD-10-CM | POA: Insufficient documentation

## 2022-09-21 DIAGNOSIS — R0603 Acute respiratory distress: Secondary | ICD-10-CM | POA: Diagnosis not present

## 2022-09-21 DIAGNOSIS — E119 Type 2 diabetes mellitus without complications: Secondary | ICD-10-CM | POA: Insufficient documentation

## 2022-09-21 DIAGNOSIS — Z171 Estrogen receptor negative status [ER-]: Secondary | ICD-10-CM | POA: Diagnosis not present

## 2022-09-21 DIAGNOSIS — Z90722 Acquired absence of ovaries, bilateral: Secondary | ICD-10-CM | POA: Insufficient documentation

## 2022-09-21 DIAGNOSIS — U071 COVID-19: Secondary | ICD-10-CM | POA: Diagnosis not present

## 2022-09-21 DIAGNOSIS — R569 Unspecified convulsions: Secondary | ICD-10-CM | POA: Insufficient documentation

## 2022-09-21 DIAGNOSIS — F17210 Nicotine dependence, cigarettes, uncomplicated: Secondary | ICD-10-CM | POA: Insufficient documentation

## 2022-09-21 DIAGNOSIS — R911 Solitary pulmonary nodule: Secondary | ICD-10-CM | POA: Insufficient documentation

## 2022-09-21 DIAGNOSIS — Z510 Encounter for antineoplastic radiation therapy: Secondary | ICD-10-CM | POA: Diagnosis not present

## 2022-09-21 DIAGNOSIS — Z7982 Long term (current) use of aspirin: Secondary | ICD-10-CM | POA: Insufficient documentation

## 2022-09-21 DIAGNOSIS — J449 Chronic obstructive pulmonary disease, unspecified: Secondary | ICD-10-CM | POA: Insufficient documentation

## 2022-09-21 DIAGNOSIS — Z170 Estrogen receptor positive status [ER+]: Secondary | ICD-10-CM | POA: Diagnosis not present

## 2022-09-21 DIAGNOSIS — Z794 Long term (current) use of insulin: Secondary | ICD-10-CM | POA: Insufficient documentation

## 2022-09-21 DIAGNOSIS — I10 Essential (primary) hypertension: Secondary | ICD-10-CM | POA: Insufficient documentation

## 2022-09-21 DIAGNOSIS — D649 Anemia, unspecified: Secondary | ICD-10-CM | POA: Diagnosis not present

## 2022-09-21 DIAGNOSIS — E785 Hyperlipidemia, unspecified: Secondary | ICD-10-CM | POA: Insufficient documentation

## 2022-09-21 LAB — BASIC METABOLIC PANEL
BUN: 17 (ref 4–21)
CO2: 22 (ref 13–22)
Chloride: 104 (ref 99–108)
Creatinine: 1 (ref 0.5–1.1)
Glucose: 181
Potassium: 3.6 mEq/L (ref 3.5–5.1)
Sodium: 140 (ref 137–147)

## 2022-09-21 LAB — HEPATIC FUNCTION PANEL
ALT: 12 U/L (ref 7–35)
AST: 22 (ref 13–35)
Alkaline Phosphatase: 82 (ref 25–125)
Bilirubin, Total: 0.4

## 2022-09-21 LAB — COMPREHENSIVE METABOLIC PANEL
Albumin: 3.9 (ref 3.5–5.0)
Calcium: 9.2 (ref 8.7–10.7)

## 2022-09-21 LAB — CBC AND DIFFERENTIAL
HCT: 43 (ref 36–46)
Hemoglobin: 14.1 (ref 12.0–16.0)
Neutrophils Absolute: 6.19
Platelets: 205 10*3/uL (ref 150–400)
WBC: 8.6

## 2022-09-21 LAB — CBC: RBC: 4.64 (ref 3.87–5.11)

## 2022-09-21 NOTE — Addendum Note (Signed)
Addended by: Juanetta Beets on: 09/21/2022 11:27 AM   Modules accepted: Orders

## 2022-09-22 ENCOUNTER — Inpatient Hospital Stay: Payer: 59

## 2022-09-22 VITALS — BP 108/60 | HR 116 | Temp 98.2°F | Resp 18 | Ht 61.0 in | Wt 123.1 lb

## 2022-09-22 DIAGNOSIS — E860 Dehydration: Secondary | ICD-10-CM

## 2022-09-22 DIAGNOSIS — C50412 Malignant neoplasm of upper-outer quadrant of left female breast: Secondary | ICD-10-CM | POA: Diagnosis not present

## 2022-09-22 DIAGNOSIS — R569 Unspecified convulsions: Secondary | ICD-10-CM | POA: Diagnosis not present

## 2022-09-22 DIAGNOSIS — Z79899 Other long term (current) drug therapy: Secondary | ICD-10-CM | POA: Diagnosis not present

## 2022-09-22 DIAGNOSIS — F17210 Nicotine dependence, cigarettes, uncomplicated: Secondary | ICD-10-CM | POA: Diagnosis not present

## 2022-09-22 DIAGNOSIS — R911 Solitary pulmonary nodule: Secondary | ICD-10-CM | POA: Diagnosis not present

## 2022-09-22 DIAGNOSIS — E876 Hypokalemia: Secondary | ICD-10-CM | POA: Diagnosis not present

## 2022-09-22 DIAGNOSIS — Z171 Estrogen receptor negative status [ER-]: Secondary | ICD-10-CM | POA: Diagnosis not present

## 2022-09-22 DIAGNOSIS — Z794 Long term (current) use of insulin: Secondary | ICD-10-CM | POA: Diagnosis not present

## 2022-09-22 DIAGNOSIS — I10 Essential (primary) hypertension: Secondary | ICD-10-CM | POA: Diagnosis not present

## 2022-09-22 DIAGNOSIS — E785 Hyperlipidemia, unspecified: Secondary | ICD-10-CM | POA: Diagnosis not present

## 2022-09-22 DIAGNOSIS — Z803 Family history of malignant neoplasm of breast: Secondary | ICD-10-CM | POA: Diagnosis not present

## 2022-09-22 DIAGNOSIS — Z90710 Acquired absence of both cervix and uterus: Secondary | ICD-10-CM | POA: Diagnosis not present

## 2022-09-22 DIAGNOSIS — Z90722 Acquired absence of ovaries, bilateral: Secondary | ICD-10-CM | POA: Diagnosis not present

## 2022-09-22 DIAGNOSIS — J449 Chronic obstructive pulmonary disease, unspecified: Secondary | ICD-10-CM | POA: Diagnosis not present

## 2022-09-22 DIAGNOSIS — E119 Type 2 diabetes mellitus without complications: Secondary | ICD-10-CM | POA: Diagnosis not present

## 2022-09-22 DIAGNOSIS — Z7982 Long term (current) use of aspirin: Secondary | ICD-10-CM | POA: Diagnosis not present

## 2022-09-22 DIAGNOSIS — Z7952 Long term (current) use of systemic steroids: Secondary | ICD-10-CM | POA: Diagnosis not present

## 2022-09-22 DIAGNOSIS — Z510 Encounter for antineoplastic radiation therapy: Secondary | ICD-10-CM | POA: Diagnosis not present

## 2022-09-22 MED ORDER — POTASSIUM CHLORIDE 10 MEQ/100ML IV SOLN
10.0000 meq | Freq: Once | INTRAVENOUS | Status: AC
  Administered 2022-09-22: 10 meq via INTRAVENOUS
  Filled 2022-09-22: qty 100

## 2022-09-22 MED ORDER — HEPARIN SOD (PORK) LOCK FLUSH 100 UNIT/ML IV SOLN
500.0000 [IU] | Freq: Once | INTRAVENOUS | Status: AC | PRN
  Administered 2022-09-22: 500 [IU]

## 2022-09-22 MED ORDER — SODIUM CHLORIDE 0.9% FLUSH
10.0000 mL | INTRAVENOUS | Status: DC | PRN
  Administered 2022-09-22: 10 mL

## 2022-09-22 MED ORDER — SODIUM CHLORIDE 0.9 % IV SOLN: Freq: Once | INTRAVENOUS | Status: AC

## 2022-09-22 NOTE — Patient Instructions (Signed)
[facility]  Discharge Instructions: Thank you for choosing [facility] to provide your oncology and hematology care.  If you have a lab appointment with the [facility], please go directly to the [facility] and check in at the registration area.   Wear comfortable clothing and clothing appropriate for easy access to any Portacath or PICC line.   We strive to give you quality time with your provider. You may need to reschedule your appointment if you arrive late (15 or more minutes).  Arriving late affects you and other patients whose appointments are after yours.  Also, if you miss three or more appointments without notifying the office, you may be dismissed from the clinic at the provider's discretion.      For prescription refill requests, have your pharmacy contact our office and allow 72 hours for refills to be completed.   Hypokalemia Hypokalemia means that the amount of potassium in the blood is lower than normal. Potassium is a mineral (electrolyte) that helps regulate the amount of fluid in the body. It also stimulates muscle tightening (contraction) and helps nerves work properly. Normally, most of the body's potassium is inside cells, and only a very small amount is in the blood. Because the amount in the blood is so small, minor changes to potassium levels in the blood can be life-threatening. What are the causes? This condition may be caused by: Antibiotic medicine. Diarrhea or vomiting. Taking too much of a medicine that helps you have a bowel movement (laxative) can cause diarrhea and lead to hypokalemia. Chronic kidney disease (CKD). Medicines that help the body get rid of excess fluid (diuretics). Eating disorders, such as anorexia or bulimia. Low magnesium levels in the body. Sweating a lot. What are the signs or symptoms? Symptoms of this condition include: Weakness. Constipation. Fatigue. Muscle cramps. Mental  confusion. Skipped heartbeats or irregular heartbeat (palpitations). Tingling or numbness. How is this diagnosed? This condition is diagnosed with a blood test. How is this treated? This condition may be treated by: Taking potassium supplements. Adjusting the medicines that you take. Eating more foods that contain a lot of potassium. If your potassium level is very low, you may need to get potassium through an IV and be monitored in the hospital. Follow these instructions at home: Eating and drinking  Eat a healthy diet. A healthy diet includes fresh fruits and vegetables, whole grains, healthy fats, and lean proteins. If told, eat more foods that contain a lot of potassium. These include: Nuts, such as peanuts and pistachios. Seeds, such as sunflower seeds and pumpkin seeds. Peas, lentils, and lima beans. Whole grain and bran cereals and breads. Fresh fruits and vegetables, such as apricots, avocado, bananas, cantaloupe, kiwi, oranges, tomatoes, asparagus, and potatoes. Juices, such as orange, tomato, and prune. Lean meats, including fish. Milk and milk products, such as yogurt. General instructions Take over-the-counter and prescription medicines only as told by your health care provider. This includes vitamins, natural food products, and supplements. Keep all follow-up visits. This is important. Contact a health care provider if: You have weakness that gets worse. You feel your heart pounding or racing. You vomit. You have diarrhea. You have diabetes and you have trouble keeping your blood sugar in your target range. Get help right away if: You have chest pain. You have shortness of breath. You have vomiting or diarrhea that lasts for more than 2 days. You faint. These symptoms may be an emergency. Get help right  away. Call 911. Do not wait to see if the symptoms will go away. Do not drive yourself to the hospital. Summary Hypokalemia means that the amount of potassium in  the blood is lower than normal. This condition is diagnosed with a blood test. Hypokalemia may be treated by taking potassium supplements, adjusting the medicines that you take, or eating more foods that are high in potassium. If your potassium level is very low, you may need to get potassium through an IV and be monitored in the hospital. This information is not intended to replace advice given to you by your health care provider. Make sure you discuss any questions you have with your health care provider. Document Revised: 04/10/2021 Document Reviewed: 04/10/2021 Elsevier Patient Education  [place].    To help prevent nausea and vomiting after your treatment, we encourage you to take your nausea medication as directed.  BELOW ARE SYMPTOMS THAT SHOULD BE REPORTED IMMEDIATELY: *FEVER GREATER THAN 100.4 F (38 C) OR HIGHER *CHILLS OR SWEATING *NAUSEA AND VOMITING THAT IS NOT CONTROLLED WITH YOUR NAUSEA MEDICATION *UNUSUAL SHORTNESS OF BREATH *UNUSUAL BRUISING OR BLEEDING *URINARY PROBLEMS (pain or burning when urinating, or frequent urination) *BOWEL PROBLEMS (unusual diarrhea, constipation, pain near the anus) TENDERNESS IN MOUTH AND THROAT WITH OR WITHOUT PRESENCE OF ULCERS (sore throat, sores in mouth, or a toothache) UNUSUAL RASH, SWELLING OR PAIN  UNUSUAL VAGINAL DISCHARGE OR ITCHING   Items with * indicate a potential emergency and should be followed up as soon as possible or go to the Emergency Department if any problems should occur.  Please show the CHEMOTHERAPY ALERT CARD or IMMUNOTHERAPY ALERT CARD at check-in to the Emergency Department and triage nurse.  Should you have questions after your visit or need to cancel or reschedule your appointment, please contact [facility]  Dept: [phone]  and follow the prompts.  Office hours are 8:00 a.m. to 4:30 p.m. Monday - Friday. Please note that voicemails left after 4:00 p.m. may not be returned  until the following business day.  We are closed weekends and major holidays. You have access to a nurse at all times for urgent questions. Please call the main number to the clinic Dept: [phone] and follow the prompts.  For any non-urgent questions, you may also contact your provider using MyChart. We now offer e-Visits for anyone 33 and older to request care online for non-urgent symptoms. For details visit [email or web address].   Also download the MyChart app! Go to the app store, search "MyChart", open the app, select [facility], and log in with your MyChart username and password.

## 2022-09-23 DIAGNOSIS — C50412 Malignant neoplasm of upper-outer quadrant of left female breast: Secondary | ICD-10-CM | POA: Diagnosis not present

## 2022-09-23 DIAGNOSIS — Z510 Encounter for antineoplastic radiation therapy: Secondary | ICD-10-CM | POA: Diagnosis not present

## 2022-09-24 DIAGNOSIS — Z171 Estrogen receptor negative status [ER-]: Secondary | ICD-10-CM | POA: Diagnosis not present

## 2022-09-24 DIAGNOSIS — Z510 Encounter for antineoplastic radiation therapy: Secondary | ICD-10-CM | POA: Diagnosis not present

## 2022-09-24 DIAGNOSIS — C50412 Malignant neoplasm of upper-outer quadrant of left female breast: Secondary | ICD-10-CM | POA: Diagnosis not present

## 2022-09-25 DIAGNOSIS — Z510 Encounter for antineoplastic radiation therapy: Secondary | ICD-10-CM | POA: Diagnosis not present

## 2022-09-25 DIAGNOSIS — C50412 Malignant neoplasm of upper-outer quadrant of left female breast: Secondary | ICD-10-CM | POA: Diagnosis not present

## 2022-09-28 DIAGNOSIS — J449 Chronic obstructive pulmonary disease, unspecified: Secondary | ICD-10-CM | POA: Diagnosis not present

## 2022-09-28 DIAGNOSIS — C50412 Malignant neoplasm of upper-outer quadrant of left female breast: Secondary | ICD-10-CM | POA: Diagnosis not present

## 2022-09-28 DIAGNOSIS — Z510 Encounter for antineoplastic radiation therapy: Secondary | ICD-10-CM | POA: Diagnosis not present

## 2022-09-28 DIAGNOSIS — G620 Drug-induced polyneuropathy: Secondary | ICD-10-CM | POA: Diagnosis not present

## 2022-09-29 ENCOUNTER — Inpatient Hospital Stay: Payer: 59

## 2022-09-29 ENCOUNTER — Encounter: Payer: Self-pay | Admitting: Oncology

## 2022-09-29 ENCOUNTER — Inpatient Hospital Stay (INDEPENDENT_AMBULATORY_CARE_PROVIDER_SITE_OTHER): Payer: 59 | Admitting: Oncology

## 2022-09-29 VITALS — BP 131/72 | HR 103 | Temp 98.0°F | Resp 18 | Ht 61.0 in | Wt 125.0 lb

## 2022-09-29 VITALS — BP 118/73 | HR 111 | Temp 97.6°F | Resp 20 | Ht 61.0 in | Wt 125.4 lb

## 2022-09-29 DIAGNOSIS — G620 Drug-induced polyneuropathy: Secondary | ICD-10-CM

## 2022-09-29 DIAGNOSIS — R911 Solitary pulmonary nodule: Secondary | ICD-10-CM | POA: Diagnosis not present

## 2022-09-29 DIAGNOSIS — Z803 Family history of malignant neoplasm of breast: Secondary | ICD-10-CM | POA: Diagnosis not present

## 2022-09-29 DIAGNOSIS — D649 Anemia, unspecified: Secondary | ICD-10-CM | POA: Diagnosis not present

## 2022-09-29 DIAGNOSIS — J449 Chronic obstructive pulmonary disease, unspecified: Secondary | ICD-10-CM | POA: Diagnosis not present

## 2022-09-29 DIAGNOSIS — C50412 Malignant neoplasm of upper-outer quadrant of left female breast: Secondary | ICD-10-CM | POA: Diagnosis not present

## 2022-09-29 DIAGNOSIS — Z171 Estrogen receptor negative status [ER-]: Secondary | ICD-10-CM

## 2022-09-29 DIAGNOSIS — E860 Dehydration: Secondary | ICD-10-CM

## 2022-09-29 DIAGNOSIS — R569 Unspecified convulsions: Secondary | ICD-10-CM | POA: Diagnosis not present

## 2022-09-29 DIAGNOSIS — E119 Type 2 diabetes mellitus without complications: Secondary | ICD-10-CM | POA: Diagnosis not present

## 2022-09-29 DIAGNOSIS — T451X5A Adverse effect of antineoplastic and immunosuppressive drugs, initial encounter: Secondary | ICD-10-CM | POA: Diagnosis not present

## 2022-09-29 DIAGNOSIS — F17210 Nicotine dependence, cigarettes, uncomplicated: Secondary | ICD-10-CM | POA: Diagnosis not present

## 2022-09-29 DIAGNOSIS — Z794 Long term (current) use of insulin: Secondary | ICD-10-CM | POA: Diagnosis not present

## 2022-09-29 DIAGNOSIS — Z510 Encounter for antineoplastic radiation therapy: Secondary | ICD-10-CM | POA: Diagnosis not present

## 2022-09-29 DIAGNOSIS — E785 Hyperlipidemia, unspecified: Secondary | ICD-10-CM | POA: Diagnosis not present

## 2022-09-29 DIAGNOSIS — Z7952 Long term (current) use of systemic steroids: Secondary | ICD-10-CM | POA: Diagnosis not present

## 2022-09-29 DIAGNOSIS — Z7982 Long term (current) use of aspirin: Secondary | ICD-10-CM | POA: Diagnosis not present

## 2022-09-29 DIAGNOSIS — Z79899 Other long term (current) drug therapy: Secondary | ICD-10-CM | POA: Diagnosis not present

## 2022-09-29 DIAGNOSIS — E876 Hypokalemia: Secondary | ICD-10-CM | POA: Diagnosis not present

## 2022-09-29 DIAGNOSIS — I10 Essential (primary) hypertension: Secondary | ICD-10-CM | POA: Diagnosis not present

## 2022-09-29 LAB — CBC: RBC: 4.57 (ref 3.87–5.11)

## 2022-09-29 LAB — BASIC METABOLIC PANEL
BUN: 22 — AB (ref 4–21)
CO2: 25 — AB (ref 13–22)
Chloride: 104 (ref 99–108)
Creatinine: 1 (ref 0.5–1.1)
Glucose: 201
Potassium: 3.6 mEq/L (ref 3.5–5.1)
Sodium: 139 (ref 137–147)

## 2022-09-29 LAB — CBC AND DIFFERENTIAL
HCT: 42 (ref 36–46)
Hemoglobin: 13.6 (ref 12.0–16.0)
Neutrophils Absolute: 5.18
Platelets: 172 10*3/uL (ref 150–400)
WBC: 7.1

## 2022-09-29 LAB — COMPREHENSIVE METABOLIC PANEL
Albumin: 3.7 (ref 3.5–5.0)
Calcium: 9.2 (ref 8.7–10.7)

## 2022-09-29 LAB — HEPATIC FUNCTION PANEL
ALT: 10 U/L (ref 7–35)
AST: 23 (ref 13–35)
Alkaline Phosphatase: 72 (ref 25–125)
Bilirubin, Total: 0.4

## 2022-09-29 MED ORDER — HEPARIN SOD (PORK) LOCK FLUSH 100 UNIT/ML IV SOLN
500.0000 [IU] | Freq: Once | INTRAVENOUS | Status: AC | PRN
  Administered 2022-09-29: 500 [IU]

## 2022-09-29 MED ORDER — SODIUM CHLORIDE 0.9% FLUSH
10.0000 mL | Freq: Once | INTRAVENOUS | Status: AC | PRN
  Administered 2022-09-29: 10 mL

## 2022-09-29 MED ORDER — SODIUM CHLORIDE 0.9 % IV SOLN: Freq: Once | INTRAVENOUS | Status: AC

## 2022-09-29 NOTE — Addendum Note (Signed)
Addended by: Juanetta Beets on: 09/29/2022 09:38 AM   Modules accepted: Orders

## 2022-09-29 NOTE — Progress Notes (Signed)
Face to face contact with pt and daughter at [facility]. Pt is here for a Med Onc follow up and will be receiving fluids today and having her last radiation treatment. This will complete the patient's treatment regimen.

## 2022-09-29 NOTE — Patient Instructions (Signed)
Dehydration, Adult Dehydration is condition in which there is not enough water or other fluids in the body. This happens when a person loses more fluids than he or she takes in. Important body parts cannot work right without the right amount of fluids. Any loss of fluids from the body can cause dehydration. Dehydration can be mild, worse, or very bad. It should be treated right away to keep it from getting very bad. What are the causes? This condition may be caused by: Conditions that cause loss of water or other fluids, such as: Watery poop (diarrhea). Vomiting. Sweating a lot. Peeing (urinating) a lot. Not drinking enough fluids, especially when you: Are ill. Are doing things that take a lot of energy to do. Other illnesses and conditions, such as fever or infection. Certain medicines, such as medicines that take extra fluid out of the body (diuretics). Lack of safe drinking water. Not being able to get enough water and food. What increases the risk? The following factors may make you more likely to develop this condition: Having a long-term (chronic) illness that has not been treated the right way, such as: Diabetes. Heart disease. Kidney disease. Being 65 years of age or older. Having a disability. Living in a place that is high above the ground or sea (high in altitude). The thinner, dried air causes more fluid loss. Doing exercises that put stress on your body for a long time. What are the signs or symptoms? Symptoms of dehydration depend on how bad it is. Mild or worse dehydration Thirst. Dry lips or dry mouth. Feeling dizzy or light-headed, especially when you stand up from sitting. Muscle cramps. Your body making: Dark pee (urine). Pee may be the color of tea. Less pee than normal. Less tears than normal. Headache. Very bad dehydration Changes in skin. Skin may: Be cold to the touch (clammy). Be blotchy or pale. Not go back to normal right after you lightly pinch  it and let it go. Little or no tears, pee, or sweat. Changes in vital signs, such as: Fast breathing. Low blood pressure. Weak pulse. Pulse that is more than 100 beats a minute when you are sitting still. Other changes, such as: Feeling very thirsty. Eyes that look hollow (sunken). Cold hands and feet. Being mixed up (confused). Being very tired (lethargic) or having trouble waking from sleep. Short-term weight loss. Loss of consciousness. How is this treated? Treatment for this condition depends on how bad it is. Treatment should start right away. Do not wait until your condition gets very bad. Very bad dehydration is an emergency. You will need to go to a hospital. Mild or worse dehydration can be treated at home. You may be asked to: Drink more fluids. Drink an oral rehydration solution (ORS). This drink helps get the right amounts of fluids and salts and minerals in the blood (electrolytes). Very bad dehydration can be treated: With fluids through an IV tube. By getting normal levels of salts and minerals in your blood. This is often done by giving salts and minerals through a tube. The tube is passed through your nose and into your stomach. By treating the root cause. Follow these instructions at home: Oral rehydration solution If told by your doctor, drink an ORS: Make an ORS. Use instructions on the package. Start by drinking small amounts, about  cup (120 mL) every 5-10 minutes. Slowly drink more until you have had the amount that your doctor said to have. Eating and drinking          Drink enough clear fluid to keep your pee pale yellow. If you were told to drink an ORS, finish the ORS first. Then, start slowly drinking other clear fluids. Drink fluids such as: Water. Do not drink only water. Doing that can make the salt (sodium) level in your body get too low. Water from ice chips you suck on. Fruit juice that you have added water to (diluted). Low-calorie sports  drinks. Eat foods that have the right amounts of salts and minerals, such as: Bananas. Oranges. Potatoes. Tomatoes. Spinach. Do not drink alcohol. Avoid: Drinks that have a lot of sugar. These include: High-calorie sports drinks. Fruit juice that you did not add water to. Soda. Caffeine. Foods that are greasy or have a lot of fat or sugar. General instructions Take over-the-counter and prescription medicines only as told by your doctor. Do not take salt tablets. Doing that can make the salt level in your body get too high. Return to your normal activities as told by your doctor. Ask your doctor what activities are safe for you. Keep all follow-up visits as told by your doctor. This is important. Contact a doctor if: You have pain in your belly (abdomen) and the pain: Gets worse. Stays in one place. You have a rash. You have a stiff neck. You get angry or annoyed (irritable) more easily than normal. You are more tired or have a harder time waking than normal. You feel: Weak or dizzy. Very thirsty. Get help right away if you have: Any symptoms of very bad dehydration. Symptoms of vomiting, such as: You cannot eat or drink without vomiting. Your vomiting gets worse or does not go away. Your vomit has blood or green stuff in it. Symptoms that get worse with treatment. A fever. A very bad headache. Problems with peeing or pooping (having a bowel movement), such as: Watery poop that gets worse or does not go away. Blood in your poop (stool). This may cause poop to look black and tarry. Not peeing in 6-8 hours. Peeing only a small amount of very dark pee in 6-8 hours. Trouble breathing. These symptoms may be an emergency. Do not wait to see if the symptoms will go away. Get medical help right away. Call your local emergency services (911 in the [place] Do not drive yourself to the hospital. Summary Dehydration is a condition in which there is not enough water or other fluids  in the body. This happens when a person loses more fluids than he or she takes in. Treatment for this condition depends on how bad it is. Treatment should be started right away. Do not wait until your condition gets very bad. Drink enough clear fluid to keep your pee pale yellow. If you were told to drink an oral rehydration solution (ORS), finish the ORS first. Then, start slowly drinking other clear fluids. Take over-the-counter and prescription medicines only as told by your doctor. Get help right away if you have any symptoms of very bad dehydration. This information is not intended to replace advice given to you by your health care provider. Make sure you discuss any questions you have with your health care provider. Document Revised: 12/03/2021 Document Reviewed: 03/09/2019 Elsevier Patient Education  2023 [employer]  

## 2022-09-29 NOTE — Addendum Note (Signed)
Addended by: Juanetta Beets on: 09/29/2022 10:19 AM   Modules accepted: Orders

## 2022-09-29 NOTE — Progress Notes (Addendum)
Lindsay Brooks  [address] [place],  [place]  [address] [phone]   Clinic Day: 09/29/22    Referring physician: Marco Collie, MD     ASSESSMENT & PLAN:  Stage IA Left breast cancer This Brooks a grade 3 triple negative 21 mm tumor for a T1c N0 M0 with a Ki-67 of 40%. She has had resection with clear margins and negative sentinel node. However, I recommended adjuvant chemotherapy with docetaxol and cytoxan every 3 weeks for 4 cycles to avoid anthracyclines.  She completed this 07/13/22. She Brooks now undergoing adjuvant radiation.   Neuropathy She complains of numbness and pain of both legs and she feels unsteady.  This was an issue even before her chemotherapy, but Brooks now worse.   Family history of breast cancer and BRCA positivity Yet she Brooks BRCA negative.   Lung nodule This Brooks a ground glass nodule of the right upper lobe which has shown indolent growth from 1.8 cm in April of 2019 to 2.2 cm currently. We will plan to monitor this with repeat imaging periodically.   Liver cirrhosis Liver Brooks decreased in attenuation diffusely with mildly irregular margins. I don't think she was aware of this diagnosis but it may explain her increased toxicities.   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   Memory impairment This Brooks consistent with early dementia. She also has significant hearing impairment.   Plan:  She Brooks scheduled to have IV fluids this week and we will see her back in 1 week with CBC and CMP and plan IV fluids. She would like to have the port out but we need to wait until she no longer needs it for IV fluids. The patient agreed with the plan and demonstrated an understanding of the instructions.  The patient was advised to call back if the symptoms worsen or if the  condition fails to improve as anticipated.     I provided 20 minutes of face-to-face time during this this encounter and > 50% was spent counseling as documented under my assessment and plan.    Lindsay Brooks [address] [place] [address] Dept: [phone] Dept Fax: [phone]        CHIEF COMPLAINT:  CC: Invasive ductal carcinoma   Current Treatment:  chemotherapy     HISTORY OF PRESENT ILLNESS:       Oncology History  Breast cancer of upper-outer quadrant of left female breast ([facility])  03/18/2022 Initial Diagnosis    Breast cancer of upper-outer quadrant of left female breast ([facility])    04/28/2022 Cancer Staging    Staging form: Breast, AJCC 8th Edition - Clinical stage from 04/28/2022: Stage IB (cT1c, cN0(sn), cM0, G3, ER-, PR-, HER2-) - Signed by Lindsay Kaplan, MD on 04/28/2022 Histopathologic type: Infiltrating duct carcinoma, NOS Stage prefix: Initial diagnosis Method of lymph node assessment: Sentinel lymph node biopsy Nuclear grade: G3 Multigene  prognostic tests performed: None Histologic grading system: 3 grade system Laterality: Left Tumor size (mm): 20 Lymph-vascular invasion (LVI): LVI not present (absent)/not identified Diagnostic confirmation: Positive histology Specimen type: Excision Staged by: Managing physician Menopausal status: Postmenopausal Ki-67 (%): 40 Stage used in treatment planning: Yes National guidelines used in treatment planning: Yes Type of national guideline used in treatment planning: NCCN    05/07/2022 -  Chemotherapy    Patient Brooks on Treatment Plan : BREAST TC q21d     05/19/2022 Genetic Testing    Negative hereditary cancer genetic testing: no pathogenic variant detected in Lindsay Brooks Common Hereditary Cancers +RNA Panel.  Report date Brooks May 19, 2022.     The Common Hereditary Cancers + RNA Panel offered by Lindsay Brooks includes  sequencing, deletion/duplication, and RNA testing of the following 47 genes: APC, ATM, AXIN2, BARD1, BMPR1A, BRCA1, BRCA2, BRIP1, CDH1, CDK4*, CDKN2A (p14ARF)*, CDKN2A (p16INK4a)*, CHEK2, CTNNA1, DICER1, EPCAM (Deletion/duplication testing only), GREM1 (promoter region deletion/duplication testing only), KIT, MEN1, MLH1, MSH2, MSH3, MSH6, MUTYH, NBN, NF1, NHTL1, PALB2, PDGFRA*, PMS2, POLD1, POLE, PTEN, RAD50, RAD51C, RAD51D, SDHB, SDHC, SDHD, SMAD4, SMARCA4. STK11, TP53, TSC1, TSC2, and VHL.  The following genes were evaluated for sequence changes only: SDHA and HOXB13 c.251G>A variant only.  RNA analysis Brooks not performed for the * genes.      Lindsay Brooks a 72 year old woman who had a screening mammogram on 01/27/2022 and was found to have a possible left breast mass. She does get yearly mammograms due to her strong family history. She thought  she could feel a lump after this was found. She had a diagnostic mammogram on 02/11/2022 with a persistent spiculated hyperdense mass with associated calcifications in the upper inner quadrant. Ultrasound confirmed an irregular hypoechoic mass with peripheral vascularity at 11:30, 3 cm from the nipple. This measures 2.2 cm and the left axilla was negative. She was referred to [name] after a biopsy was done on 02/17/2022, which confirmed a grade 3 invasive ductal carcinoma which Brooks triple negative with a Ki-67 of 40%. [name] took her to surgery on 03/18/2022 for lumpectomy and sentinel lymph node. The final pathology revealed a 20 mm grade 3 invasive ductal carcinoma with DCIS. The margins were clear and the lymph node was negative for a T1cN0M0, stage IA. She has had a port placed.     INTERVAL HISTORY:  Lindsay Brooks seen here today for invasive ductal carcinoma for continued supportive care after completing chemotherapy. She reports that she Brooks feeling well. The only complaint she had was her legs, and her daughter noted that she was having chest pain last night. She  describes it as feeling 'icky'. She will be receiving her last radiation treatment today. She notes that she Brooks slightly dehydrated. She Brooks scheduled for IV fluids this week. BUN Brooks up to 22 and Blood sugar Brooks 201 otherwise everything else Brooks good. She denies signs of infection such as sore throat, sinus drainage, cough, or urinary symptoms.  She denies fevers or recurrent chills. She denies pain. She denies nausea, vomiting, chest pain, dyspnea or cough. Her appetite Brooks ok and her weight has increased 2 pounds since her last visit.    SOCIAL HISTORY She lives with her first daughter, who Brooks present at today's visit. She has 2 daughters. She smokes 1.5 packs a day, and has been smoking since 72 yo. She Brooks not sure about quitting. She did smoke 2 packs per day for over 50 years. She doesn't drink other  than rare occasion. She Brooks widowed. She has been married 4 times. She Brooks disabled.   Menstrual History 72 years old when she had her first child She has had 3 children 66 years old when starting menstrual period Menopause started at 28 She has had a partial hysterectomy in her 65's and went back for complete hysterectomy/BSO later. No hormone replacement therapy   FAMILY HISTORY Daughter had breast cancer at age 55 Aunt had breast cancer at age 72 Her sister had a brain tumor at age 36     SURGICAL HISTORY She had a partial Hysterectomy in her late 27's, and went back a few years later to have BSO. Tonsillectomy cataract surgery  Stents of the iliac and femoral arteries. Nerve repair of the left upper extremity   PAST MEDICAL HISTORY Osteoporosis Coronary artery disease COPD Diabetes  Migraine headaches Hyperlipidemia Hypertension TIA Anxiety GERD Peripheral vascular disease Hard of hearing     REVIEW OF SYSTEMS:  Review of Systems  Constitutional: Negative.  Negative for appetite change, chills, diaphoresis, fatigue, fever and unexpected weight change.  HENT:   Positive  for hearing loss. Negative for lump/mass, mouth sores, nosebleeds, sore throat, tinnitus, trouble swallowing and voice change.   Eyes: Negative.  Negative for eye problems and icterus.  Respiratory: Negative.  Negative for chest tightness, cough, hemoptysis, shortness of breath and wheezing.   Cardiovascular: Negative.  Negative for chest pain, leg swelling and palpitations.  Gastrointestinal: Negative.  Negative for abdominal distention, abdominal pain, blood in stool, constipation, diarrhea, nausea, rectal pain and vomiting.  Endocrine: Negative.  Negative for hot flashes.  Genitourinary: Negative.  Negative for bladder incontinence, difficulty urinating, dyspareunia, dysuria, frequency, hematuria, menstrual problem, nocturia, pelvic pain, vaginal bleeding and vaginal discharge.   Musculoskeletal: Negative.  Negative for arthralgias, back pain, flank pain, gait problem, myalgias, neck pain and neck stiffness.  Skin: Negative.  Negative for itching, rash and wound.  Neurological: Negative.  Negative for dizziness, extremity weakness, gait problem, headaches, light-headedness, numbness, seizures and speech difficulty.  Hematological: Negative.  Negative for adenopathy. Does not bruise/bleed easily.  Psychiatric/Behavioral:  Positive for sleep disturbance. Negative for confusion, decreased concentration, depression and suicidal ideas. The patient Brooks not nervous/anxious.    Marland Kitchen     VITALS:  Blood pressure (!) 112/55, pulse (!) 119, temperature 97.8 F (36.6 C), temperature source Oral, resp. rate 20, height '5\' 1"'$  (1.549 m), weight 137 lb 1.6 oz (62.2 kg), SpO2 96 %.     Wt Readings from Last 3 Encounters:  06/24/22 140 lb (63.5 kg)  06/22/22 132 lb 1.3 oz (59.9 kg)  06/18/22 137 lb 1.6 oz (62.2 kg)    Body mass index Brooks 25.9 kg/m.   Performance status (ECOG): 0 - Asymptomatic   PHYSICAL EXAM:  Physical Exam Vitals and nursing note reviewed.  Constitutional:      General: She Brooks not in  acute distress.    Appearance: Normal appearance. She Brooks well-developed and normal weight. She Brooks not ill-appearing, toxic-appearing or diaphoretic.  HENT:     Head: Normocephalic and atraumatic.     Right Ear: Tympanic membrane, ear canal and external ear normal. There Brooks no impacted cerumen.     Left Ear: Tympanic membrane, ear canal and external ear normal. There Brooks no impacted cerumen.     Nose: Nose normal. No congestion or rhinorrhea.     Mouth/Throat:     Mouth: Mucous membranes are moist.     Pharynx: Oropharynx Brooks clear. No  oropharyngeal exudate or posterior oropharyngeal erythema.  Eyes:     General: Lids are normal. Vision grossly intact. No scleral icterus.       Right eye: No discharge.        Left eye: No discharge.     Extraocular Movements: Extraocular movements intact.     Conjunctiva/sclera: Conjunctivae normal.     Pupils: Pupils are equal, round, and reactive to light.  Neck:     Vascular: No carotid bruit.  Cardiovascular:     Rate and Rhythm: Normal rate and regular rhythm.     Pulses: Normal pulses.     Heart sounds: Normal heart sounds. No murmur heard.    No friction rub. No gallop.  Pulmonary:     Effort: Pulmonary effort Brooks normal. No respiratory distress.     Breath sounds: Normal breath sounds. No stridor. No wheezing, rhonchi or rales.  Chest:     Chest wall: No tenderness.  Breasts:    Right: Normal.     Left: Normal.     Comments: Severe hyperpigmentation erythema of the left breast consistent with radiation changes slight incramation at the inframammary fold  Abdominal:     General: Bowel sounds are normal. There Brooks no distension.     Palpations: Abdomen Brooks soft. There Brooks no hepatomegaly, splenomegaly or mass.     Tenderness: There Brooks no abdominal tenderness. There Brooks no right CVA tenderness, left CVA tenderness, guarding or rebound.     Hernia: No hernia Brooks present.  Musculoskeletal:        General: No swelling, tenderness, deformity or signs  of injury. Normal range of motion.     Cervical back: Normal range of motion and neck supple. No rigidity or tenderness.     Right lower leg: No edema.     Left lower leg: No edema.  Lymphadenopathy:     Cervical: No cervical adenopathy.     Right cervical: No superficial, deep or posterior cervical adenopathy.    Left cervical: No superficial, deep or posterior cervical adenopathy.     Upper Body:     Right upper body: No supraclavicular, axillary or pectoral adenopathy.     Left upper body: No supraclavicular, axillary or pectoral adenopathy.  Skin:    General: Skin Brooks warm and dry.     Coloration: Skin Brooks not jaundiced or pale.     Findings: No bruising, erythema, lesion or rash.     Comments: Decreased trugor  Neurological:     General: No focal deficit present.     Mental Status: She Brooks alert and oriented to person, place, and time. Mental status Brooks at baseline.     Cranial Nerves: No cranial nerve deficit.     Sensory: No sensory deficit.     Motor: No weakness.     Coordination: Coordination normal.     Gait: Gait normal.     Deep Tendon Reflexes: Reflexes normal.  Psychiatric:        Mood and Affect: Mood normal.        Behavior: Behavior normal. Behavior Brooks cooperative.        Thought Content: Thought content normal.        Judgment: Judgment normal.         LABS:        Latest Ref Rng & Units 06/18/2022   12:00 AM 05/26/2022   12:00 AM 05/18/2022   12:00 AM  CBC  WBC   11.5  19.1     33.5      Hemoglobin 12.0 - 16.0 12.5     12.2     14.9      Hematocrit 36 - 46 38     37     45      Platelets 150 - 400 K/uL 234     238     247                 Ref Range & Units 2 wk ago (08/21/22) 1 mo ago (08/07/22) 2 mo ago (07/09/22) 2 mo ago (06/18/22) 3 mo ago (05/26/22) 3 mo ago (05/18/22) 4 mo ago (04/30/22)   Hemoglobin 12.0 - 16.0 13.2 13.1 10.4 Abnormal  12.5 12.2 14.9 13.9  HCT 36 - 46 40 40 31 Abnormal  38 37 45 42  Neutrophils Absolute  6.44 8.32 9.83 8.97  14.52 22.11 6.28  Platelets 150 - 400 K/uL 210 205 239 234 238 247 215  WBC  9.2 10.4 12.6 11.5 19.1 33.5 10.3  MCV 81 - 99 94         Component Ref Range & Units 2 wk ago (08/21/22) 1 mo ago (08/07/22) 2 mo ago (07/09/22) 2 mo ago (06/18/22) 3 mo ago (05/26/22) 3 mo ago (05/18/22) 4 mo ago (04/30/22)  Glucose  144 153 184 High  R, CM 275 255 119 123  BUN 4 - '21 14 18 11 '$ R 7 15 37 Abnormal  11  CO2 13 - '22 18 18 25 '$ R 23 Abnormal  27 Abnormal  21 26 Abnormal   Creatinine 0.5 - 1.1 0.9 1.2 Abnormal  0.90 R 0.8 1.0 1.3 Abnormal  1.1  Potassium 3.5 - 5.1 mEq/L 3.0 Abnormal  3.0 Abnormal  4.4 R 3.3 Abnormal  3.6 3.0 Abnormal  3.4 Abnormal   Sodium 137 - 147 138 139 141 R 133 Abnormal  136 Abnormal  138 134 Abnormal   Chloride 99 - 108 104 105 108 R 98 Abnormal  104 103 100             Component Ref Range & Units 2 wk ago (08/21/22) 1 mo ago (08/07/22) 2 mo ago (07/09/22) 2 mo ago (06/18/22) 3 mo ago (05/26/22) 3 mo ago (05/18/22) 4 mo ago (04/30/22)  Calcium 8.7 - 10.7 9.0 9.4 8.6 Low  R 9.0 8.6 Abnormal  9.0 9.3  Albumin 3.5 - 5.0 3.9 4.0 2.8 Low  R 3.7 3.3 Abnormal  3.9 4.0     Component Ref Range & Units 2 wk ago (08/21/22) 1 mo ago (08/07/22) 2 mo ago (07/09/22) 2 mo ago (06/18/22) 3 mo ago (05/26/22) 3 mo ago (05/18/22) 4 mo ago (04/30/22)  Alkaline Phosphatase 25 - 125 87 94 58 R 105 71 120 89  ALT 7 - 35 U/L '13 13 8 '$ R '18 18 22 16  '$ AST 13 - 35 '21 20 15 '$ R '24 20 31 21    '$ Recent Labs  No results found for: "CEA1", "CEA"   /  Last Labs  No results found for: "CEA1", "CEA"   Recent Labs  No results found for: "PSA1"   Recent Labs  No results found for: "[id number]"   Recent Labs  No results found for: "CAN125"    Recent Labs  No results found for: "TOTALPROTELP", "ALBUMINELP", "A1GS", "A2GS", "BETS", "BETA2SER", "GAMS", "MSPIKE", "SPEI"   Recent Labs  No results found for: "TIBC", "FERRITIN", "IRONPCTSAT"   Recent Labs  No results found  for: "LDH"    STUDIES:   EXAM:05/05/22 NUCLEAR MEDICINE WHOLE BODY BONE SCAN IMPRESSION: Uptake at LEFT tibial metaphysis especially medially, nonspecific; radiographic correlation recommended to exclude metastatic focus.   Focal abnormal tracer uptake at the LEFT temporo-occipital calvarium, corresponding to and focus of abnormal marrow signal on prior MR head exams back to 2010, nonspecific but likely benign.   No additional scintigraphic findings to suggest osseous metastatic disease.   Diffuse calvarial uptake, favoring metabolic bone disease.    EXAM:05/05/22 CT CHEST WITH CONTRAST IMPRESSION: Ground glass nodule in the right upper lobe with a possible developing internal solid component. Lesion has shown indolent growth from 11/11/2017, at which time it measured approcimately 1.2 x 1.8 cm. Findings are worrisome for adenocarcinoma.  No definitive evidence of metastatic breast cancer. Steatotic cirrhotic liver   EXAM:03/18/2022 REPORT SURGICAL PATHOLOGY DIAGNOSIS Breast, lumpectomy, Left UOQ Invasive ductal carcinoma, grade 3, and ductal carcinoma in situ Ribbon clip and biopsy site present All margins negative for invasive carcinoma and DCIS Fibrocystic changes Lymph node, sentinel, biopsy One lymph node negative for tumor (0/1)   Estrogen receptor: 0% Progesterone receptor: 0% HER2 negative at 1+ Proliferation marker Ki-67: 40%   Bone density scan 01/27/2022 1.  Osteoporosis of the spine with a T score of -2.5, worse 2.  Left femur - osteoporosis with a T score of -2.7, slightly better 3.  Dual femur total mean-osteoporosis with a T score of -2.5, worse     Allergies:       Allergies  Allergen Reactions   Amoxicillin Hives      vomiting   Celecoxib Hives   Claritin [Loratadine] Other (See Comments)      Pt's daughter notified us of this allergy.   Insulins Nausea And Vomiting      Patient unsure of name of insulin, will call us back   Vioxx [Rofecoxib] Hives   Liraglutide Nausea And  Vomiting   Propranolol Nausea And Vomiting      diarrhea diarrhea diarrhea        Current Medications:       Current Outpatient Medications  Medication Sig Dispense Refill   aspirin EC 81 MG tablet Take 81 mg by mouth daily.       atenolol (TENORMIN) 50 MG tablet Take 75 mg by mouth 2 (two) times daily.        busPIRone (BUSPAR) 7.5 MG tablet Take 1 tablet by mouth in the morning, at noon, and at bedtime.       dexamethasone (DECADRON) 4 MG tablet Take 2 tabs by mouth 2 times daily starting day before chemo. Then take 2 tabs daily for 2 days starting day after chemo. Take with food. 30 tablet 1   DULoxetine (CYMBALTA) 60 MG capsule Take 120 mg by mouth at bedtime.        famotidine (PEPCID) 20 MG tablet Take 20 mg by mouth 2 (two) times daily.       fenofibrate (TRICOR) 145 MG tablet Take 145 mg by mouth daily.       fexofenadine (ALLEGRA) 180 MG tablet Take 180 mg by mouth daily.       gabapentin (NEURONTIN) 600 MG tablet Take 600 mg by mouth 3 (three) times daily.       hydrALAZINE (APRESOLINE) 50 MG tablet Take 50 mg by mouth 3 (three) times daily.       lidocaine-prilocaine (EMLA) cream Apply to affected area once 30 g 3   LORazepam (ATIVAN) 0.5 MG tablet Take  1 tablet (0.5 mg total) by mouth at bedtime as needed for sleep. May take one tab prior to imaging studies due to anxiety 30 tablet 0   Melatonin 1 MG CAPS Take by mouth. (Patient not taking: Reported on 04/30/2022)       metoprolol tartrate (LOPRESSOR) 25 MG tablet Take 25 mg by mouth 2 (two) times daily.       nicotine (NICODERM CQ - DOSED IN MG/24 HOURS) 21 mg/24hr patch Place 1 patch (21 mg total) onto the skin daily. 28 patch 0   nitroGLYCERIN (NITROSTAT) 0.4 MG SL tablet Place 0.4 mg under the tongue every 5 (five) minutes as needed. For chest pain       ondansetron (ZOFRAN-ODT) 4 MG disintegrating tablet Take 4 mg by mouth every 6 (six) hours as needed.       oxyCODONE (OXY IR/ROXICODONE) 5 MG immediate release tablet  Take 5 mg by mouth every 6 (six) hours as needed.       pantoprazole (PROTONIX) 40 MG tablet Take 40 mg by mouth 2 (two) times daily.       potassium chloride (KLOR-CON M) 10 MEQ tablet Take 1 tablet (10 mEq total) by mouth daily. 30 tablet 5   prochlorperazine (COMPAZINE) 10 MG tablet Take 1 tablet (10 mg total) by mouth every 6 (six) hours as needed for nausea or vomiting. 30 tablet 1   tirzepatide (MOUNJARO) 2.5 MG/0.5ML Pen INJECT 2.5 MILLIGRAM EVERY WEEK       TRESIBA FLEXTOUCH 100 UNIT/ML FlexTouch Pen Inject into the skin.       valsartan-hydrochlorothiazide (DIOVAN-HCT) 320-25 MG tablet TAKE 1 TABLET BY ORAL ROUTE ONCE DAILY FOR HIGH BLOOD PRESSURE       zolpidem (AMBIEN) 10 MG tablet Take 10 mg by mouth at bedtime as needed for sleep.  (Patient not taking: Reported on 04/30/2022)        No current facility-administered medications for this visit.         I,Gabriella Ballesteros,acting as a scribe for Lindsay Kaplan, MD.,have documented all relevant documentation on the behalf of Lindsay Kaplan, MD,as directed by  Lindsay Kaplan, MD while in the presence of Lindsay Kaplan, MD.

## 2022-09-30 DIAGNOSIS — Z510 Encounter for antineoplastic radiation therapy: Secondary | ICD-10-CM | POA: Diagnosis not present

## 2022-09-30 DIAGNOSIS — C50412 Malignant neoplasm of upper-outer quadrant of left female breast: Secondary | ICD-10-CM | POA: Diagnosis not present

## 2022-10-01 ENCOUNTER — Encounter: Payer: Self-pay | Admitting: Oncology

## 2022-10-01 NOTE — Progress Notes (Signed)
Lindsay Brooks  [address] [place],  [place]  [address] [phone]   Clinic Day:09/21/22     Referring physician: Marco Collie, MD     ASSESSMENT & PLAN:  Stage IA Left breast cancer This is a grade 3 triple negative 21 mm tumor for a T1cN0M0 with a Ki-67 of 40%. She has had resection with clear margins and negative sentinel node. However, I recommended adjuvant chemotherapy with docetaxol and cyclophosphamide every 3 weeks for 4 cycles to avoid anthracyclines.  She has now completed this 07/13/22. She is now undergoing adjuvant radiation.   Neuropathy She complains of numbness and pain of both legs and she feels unsteady.   Family history of breast cancer and BRCA positivity Yet she is BRCA negative.   Lung nodule This is a ground glass nodule of the right upper lobe which has shown indolent growth from 1.8 cm in April of 2019 to 2.2 cm currently. We will plan to monitor this with repeat imaging periodically.   Liver cirrhosis Liver is decreased in attenuation diffusely with mildly irregular margins. I don't think she was aware of this diagnosis but it may explain her increased toxicities.   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   Memory impairment This is consistent with early dementia. She also has significant hearing impairment.   Hypokalemia CMP reveals a potassium of 3.6, up from 3.0 and so I will order a small amount of  IV potassium to get this corrected. She will continue potassium to 39mq TID.   Plan:  Her CBC and CMP are normal. Her BP is low at 109/54 but she is scheduled for an infusion tomorrow morning. She has 10 more radiation treatments. She will be back in 1 week for recheck with CBC, CMP, and probable IV fluids. I will see her again in 2 weeks with probably IV fluids. The patient agreed with the plan and demonstrated  an understanding of the instructions.  The patient was advised to call back if the symptoms worsen or if the condition fails to improve as anticipated.     I provided 30 minutes of face-to-face time during this this encounter and > 50% was spent counseling as documented under my assessment and plan.    CDerwood Kaplan MD [address]Dept: [phone] Fax: [phone]       CHIEF COMPLAINT:  CC: Invasive ductal carcinoma   Current Treatment:  chemotherapy     HISTORY OF PRESENT ILLNESS:       Oncology History  Breast cancer of upper-outer quadrant of left female breast (Lindsay Brooks  03/18/2022 Initial Diagnosis    Breast cancer of upper-outer quadrant of left female breast (Lindsay Brooks    04/28/2022 Cancer Staging    Staging form: Breast, AJCC 8th Edition - Clinical stage from 04/28/2022: Stage IB (cT1c, cN0(sn), cM0, G3, ER-, PR-, HER2-) - Signed by MDerwood Kaplan MD on 04/28/2022 Histopathologic type: Infiltrating duct carcinoma, NOS Stage prefix: Initial diagnosis Method of lymph node assessment: Sentinel lymph node biopsy Nuclear grade: G3 Multigene prognostic tests performed: None Histologic grading system: 3 grade system Laterality: Left Tumor size (mm): 20 Lymph-vascular invasion (LVI): LVI not present (absent)/not identified Diagnostic confirmation: Positive histology Specimen type: Excision Staged by: Managing physician Menopausal status: Postmenopausal Ki-67 (%): 40 Stage used in treatment  planning: Yes National guidelines used in treatment planning: Yes Type of national guideline used in treatment planning: NCCN    05/07/2022 -  Chemotherapy    Patient is on Treatment Plan : BREAST TC q21d     05/19/2022 Genetic Testing    Negative hereditary cancer genetic testing: no pathogenic variant detected in [facility] Common Hereditary Cancers +RNA Panel.  Report  date is May 19, 2022.     The Common Hereditary Cancers + RNA Panel offered by [facility] includes sequencing, deletion/duplication, and RNA testing of the following 47 genes: APC, ATM, AXIN2, BARD1, BMPR1A, BRCA1, BRCA2, BRIP1, CDH1, CDK4*, CDKN2A (p14ARF)*, CDKN2A (p16INK4a)*, CHEK2, CTNNA1, DICER1, EPCAM (Deletion/duplication testing only), GREM1 (promoter region deletion/duplication testing only), KIT, MEN1, MLH1, MSH2, MSH3, MSH6, MUTYH, NBN, NF1, NHTL1, PALB2, PDGFRA*, PMS2, POLD1, POLE, PTEN, RAD50, RAD51C, RAD51D, SDHB, SDHC, SDHD, SMAD4, SMARCA4. STK11, TP53, TSC1, TSC2, and VHL.  The following genes were evaluated for sequence changes only: SDHA and HOXB13 c.251G>A variant only.  RNA analysis is not performed for the * genes.      Lindsay Brooks is a 72 year old woman who had a screening mammogram on 01/27/2022 and was found to have a possible left breast mass. She does get yearly mammograms due to her strong family history. She thought  she could feel a lump after this was found. She had a diagnostic mammogram on 02/11/2022 with a persistent spiculated hyperdense mass with associated calcifications in the upper inner quadrant. Ultrasound confirmed an irregular hypoechoic mass with peripheral vascularity at 11:30, 3 cm from the nipple. This measures 2.2 cm and the left axilla was negative. She was referred to [name] after a biopsy was done on 02/17/2022, which confirmed a grade 3 invasive ductal carcinoma which is triple negative with a Ki-67 of 40%. [name] took her to surgery on 03/18/2022 for lumpectomy and sentinel lymph node. The final pathology revealed a 20 mm grade 3 invasive ductal carcinoma with DCIS. The margins were clear and the lymph node was negative for a T1cN0M0, stage IA. She has had a port placed.     INTERVAL HISTORY:  Lindsay Brooks is seen here today for invasive ductal carcinoma for continued supportive care after completing chemotherapy. Patient states that she feels ok and has  lightheadiness and had a stomach virus, but she is better now. Her CMP and CBC are normal. Her BP is low at 109/54 but she is scheduled for an infusion tomorrow morning. Her daughter informed me that her mom has 10 more radiation treatments. She will be back in 1 week for recheck with CBC, CMP, and probable IV fluids. I will see her again in 2 weeks with probable IV fluids. She denies signs of infection such as sore throat, sinus drainage, cough, or urinary symptoms.  She denies fevers or recurrent chills. She denies pain. She denies nausea, vomiting, chest pain, dyspnea or cough. She has no appetite.  Her  weight has decreased 3 pounds over last 2 weeks.She is accompanied by her daughter at today's visit.   HISTORY:  SOCIAL HISTORY She lives with her first daughter. She has 2 daughters. She smokes 1.5 packs a day, and has been smoking since 72 yo. She is not sure about quitting. She did smoke 2 packs per day for over 50 years. She doesn't drink other than rare occasion. She is widowed. She has been married 4 times. She is disabled.   Menstrual History 72 years old when she had her first child She has had 3 children  75 years old when starting menstrual period Menopause started at 27 She has had a partial hysterectomy in her 27's and went back for complete hysterectomy/BSO later. No hormone replacement therapy   FAMILY HISTORY Daughter had breast cancer at age 77 Aunt had breast cancer at age 25 Her sister had a brain tumor at age 33     SURGICAL HISTORY She had a partial Hysterectomy in her late 4's, and went back a few years later to have BSO. Tonsillectomy cataract surgery  Stents of the iliac and femoral arteries. Nerve repair of the left upper extremity   PAST MEDICAL HISTORY Osteoporosis Coronary artery disease COPD Diabetes  Migraine headaches Hyperlipidemia Hypertension TIA Anxiety GERD Peripheral vascular disease Hard of hearing     REVIEW OF SYSTEMS:  Review of  Systems  Constitutional:  Positive for appetite change. Negative for chills, diaphoresis, fatigue, fever and unexpected weight change.  HENT:   Positive for hearing loss. Negative for lump/mass, mouth sores, nosebleeds, sore throat, tinnitus, trouble swallowing and voice change.   Eyes: Negative.  Negative for eye problems and icterus.  Respiratory: Negative.  Negative for chest tightness, cough, hemoptysis, shortness of breath and wheezing.   Cardiovascular: Negative.  Negative for chest pain, leg swelling and palpitations.  Gastrointestinal: Negative.  Negative for abdominal distention, abdominal pain, blood in stool, constipation, diarrhea, nausea, rectal pain and vomiting.  Endocrine: Negative.  Negative for hot flashes.  Genitourinary: Negative.  Negative for bladder incontinence, difficulty urinating, dyspareunia, dysuria, frequency, hematuria, menstrual problem, nocturia, pelvic pain, vaginal bleeding and vaginal discharge.   Musculoskeletal: Negative.  Negative for arthralgias, back pain, flank pain, gait problem, myalgias, neck pain and neck stiffness.  Skin: Negative.  Negative for itching, rash and wound.  Neurological:  Positive for light-headedness. Negative for dizziness, extremity weakness, gait problem, headaches, numbness, seizures and speech difficulty.  Hematological: Negative.  Negative for adenopathy. Does not bruise/bleed easily.  Psychiatric/Behavioral:  Positive for sleep disturbance. Negative for confusion, decreased concentration, depression and suicidal ideas. The patient is not nervous/anxious.    VITALS:  Blood pressure (!) 112/55, pulse (!) 119, temperature 97.8 F (36.6 C), temperature source Oral, resp. rate 20, height 5' 1"$  (1.549 m), weight 137 lb 1.6 oz (62.2 kg), SpO2 96 %.     Wt Readings from Last 3 Encounters:  06/24/22 140 lb (63.5 kg)  06/22/22 132 lb 1.3 oz (59.9 kg)  06/18/22 137 lb 1.6 oz (62.2 kg)    Body mass index is 25.9 kg/m.   Performance  status (ECOG): 0 - Asymptomatic   PHYSICAL EXAM:  Physical Exam Vitals and nursing note reviewed. Exam conducted with a chaperone present.  Constitutional:      General: She is not in acute distress.    Appearance: Normal appearance. She is well-developed and normal weight. She is not ill-appearing, toxic-appearing or diaphoretic.  HENT:     Head: Normocephalic and atraumatic.     Right Ear: Tympanic membrane, ear canal and external ear normal. There is no impacted cerumen.     Left Ear: Tympanic membrane, ear canal and external ear normal. There is no impacted cerumen.     Nose: Nose normal. No congestion or rhinorrhea.     Mouth/Throat:     Mouth: Mucous membranes are moist.     Pharynx: Oropharynx is clear. No oropharyngeal exudate or posterior oropharyngeal erythema.  Eyes:     General: Lids are normal. Vision grossly intact. No scleral icterus.       Right eye:  No discharge.        Left eye: No discharge.     Extraocular Movements: Extraocular movements intact.     Conjunctiva/sclera: Conjunctivae normal.     Pupils: Pupils are equal, round, and reactive to light.  Neck:     Vascular: No carotid bruit.  Cardiovascular:     Rate and Rhythm: Normal rate and regular rhythm.     Pulses: Normal pulses.     Heart sounds: Normal heart sounds. No murmur heard.    No friction rub. No gallop.  Pulmonary:     Effort: Pulmonary effort is normal. No respiratory distress.     Breath sounds: Normal breath sounds. No stridor. No wheezing, rhonchi or rales.  Chest:     Chest wall: No tenderness.  Breasts:    Right: Normal.     Left: Normal.     Comments: The left breast has extensive radiation changes and hyperpigmentation but no peeling. There are no masses in either breast. Abdominal:     General: Bowel sounds are normal. There is no distension.     Palpations: Abdomen is soft. There is no hepatomegaly, splenomegaly or mass.     Tenderness: There is no abdominal tenderness. There is  no right CVA tenderness, left CVA tenderness, guarding or rebound.     Hernia: No hernia is present.  Musculoskeletal:        General: No swelling, tenderness, deformity or signs of injury. Normal range of motion.     Cervical back: Normal range of motion and neck supple. No rigidity or tenderness.     Right lower leg: No edema.     Left lower leg: No edema.  Lymphadenopathy:     Cervical: No cervical adenopathy.     Right cervical: No superficial, deep or posterior cervical adenopathy.    Left cervical: No superficial, deep or posterior cervical adenopathy.     Upper Body:     Right upper body: No supraclavicular, axillary or pectoral adenopathy.     Left upper body: No supraclavicular, axillary or pectoral adenopathy.  Skin:    General: Skin is warm and dry.     Coloration: Skin is not jaundiced or pale.     Findings: No bruising, erythema, lesion or rash.     Comments: Decreased trugor  Neurological:     General: No focal deficit present.     Mental Status: She is alert and oriented to person, place, and time. Mental status is at baseline.     Cranial Nerves: No cranial nerve deficit.     Sensory: No sensory deficit.     Motor: No weakness.     Coordination: Coordination normal.     Gait: Gait normal.     Deep Tendon Reflexes: Reflexes normal.  Psychiatric:        Mood and Affect: Mood normal.        Behavior: Behavior normal. Behavior is cooperative.        Thought Content: Thought content normal.        Judgment: Judgment normal.         LABS:        Latest Ref Rng & Units 06/18/2022   12:00 AM 05/26/2022   12:00 AM 05/18/2022   12:00 AM  CBC  WBC   11.5     19.1     33.5      Hemoglobin 12.0 - 16.0 12.5     12.2     14.9  Hematocrit 36 - 46 38     37     45      Platelets 150 - 400 K/uL 234     238     247               Component Ref Range & Units 2 wk ago (09/07/22) 1 mo ago (08/21/22) 1 mo ago (08/07/22) 2 mo ago (07/09/22)  Hemoglobin 12.0 - 16.0 13.0  13.2 13.1 10.4 Abnormal   HCT 36 - 46 40 40 40 31 Abnormal   Neutrophils Absolute  5.31 6.44 8.32 9.83  Platelets 150 - 400 K/uL 181 210 205 239  WBC  7.7 9.2 10.4 12.6    This result is from an external source.     Component Ref Range & Units 9 d ago (09/07/22) 3 wk ago (08/21/22) 1 mo ago (08/07/22)  Glucose  163 144 153  BUN 4 - 21 13 14 18  $ CO2 13 - 22 27 Abnormal  18 18  Creatinine 0.5 - 1.1 0.9 0.9 1.2 Abnormal   Potassium 3.5 - 5.1 mEq/L 3.0 Abnormal  3.0 Abnormal  3.0 Abnormal   Sodium 137 - 147 137 138 139  Chloride 99 - 108 100 104    Component Ref Range & Units 9 d ago (09/07/22) 3 wk ago (08/21/22) 1 mo ago (08/07/22) 2 mo ago (07/09/22)  Calcium 8.7 - 10.7 9.2 9.0 9.4 8.6 Low  R  Albumin 3.5 - 5.0 3.7 3.9 4.0      Component Ref Range & Units 9 d ago (09/07/22) 3 wk ago (08/21/22) 1 mo ago (08/07/22)  Alkaline Phosphatase 25 - 125 79 87 94  ALT 7 - 35 U/L 10 13 13  $ AST 13 - 35 16 21 20  $ Bilirubin, Total  0.3 0.4 0.6       Latest Ref Rng & Units 06/18/2022   12:00 AM 05/26/2022   12:00 AM 05/18/2022   12:00 AM  CMP  BUN 4 - 21 7     15     $ 37      Creatinine 0.5 - 1.1 0.8     1.0     1.3      Sodium 137 - 147 133     136     138      Potassium 3.5 - 5.1 mEq/L 3.3     3.6     3.0      Chloride 99 - 108 98     104     103      CO2 13 - 22 23     27     21      $ Calcium 8.7 - 10.7 9.0     8.6     9.0      Total Protein 6.3 - 8.2 g/dL   6        Alkaline Phos 25 - 125 105     71     120      AST 13 - 35 24     20     31      $ ALT 7 - 35 U/L 18     18     22         $ This result is from an external source.    CMP    Component Ref Range & Units 4 wk ago (07/09/22)  Sodium 135 - 145 mmol/L 141  Potassium 3.5 - 5.1 mmol/L  4.4  Chloride 98 - 111 mmol/L 108  CO2 22 - 32 mmol/L 25  Glucose, Bld 70 - 99 mg/dL 184 High     BUN 8 - 23 mg/dL 11  Creatinine 0.44 - 1.00 mg/dL 0.90  Calcium 8.9 - 10.3 mg/dL 8.6 Low   Total Protein 6.5 - 8.1 g/dL 5.4 Low   Albumin  3.5 - 5.0 g/dL 2.8 Low   AST 15 - 41 U/L 15  ALT 0 - 44 U/L 8       Recent Labs  No results found for: "CEA1", "CEA"   /  Last Labs  No results found for: "CEA1", "CEA"   Recent Labs  No results found for: "PSA1"   Recent Labs  No results found for: "[id number]"   Recent Labs  No results found for: "[id number]"    Recent Labs  No results found for: "TOTALPROTELP", "ALBUMINELP", "A1GS", "A2GS", "BETS", "BETA2SER", "GAMS", "MSPIKE", "SPEI"   Recent Labs  No results found for: "TIBC", "FERRITIN", "IRONPCTSAT"   Recent Labs  No results found for: "LDH"     STUDIES:  EXAM:05/05/22 NUCLEAR MEDICINE WHOLE BODY BONE SCAN IMPRESSION: Uptake at LEFT tibial metaphysis especially medially, nonspecific; radiographic correlation recommended to exclude metastatic focus.   Focal abnormal tracer uptake at the LEFT temporo-occipital calvarium, corresponding to and focus of abnormal marrow signal on prior MR head exams back to 2010, nonspecific but likely benign.   No additional scintigraphic findings to suggest osseous metastatic disease.   Diffuse calvarial uptake, favoring metabolic bone disease.    EXAM:05/05/22 CT CHEST WITH CONTRAST IMPRESSION: Ground glass nodule in the right upper lobe with a possible developing internal solid component. Lesion has shown indolent growth from 11/11/2017, at which time it measured approcimately 1.2 x 1.8 cm. Findings are worrisome for adenocarcinoma.  No definitive evidence of metastatic breast cancer. Steatotic cirrhotic liver   EXAM:03/18/2022 REPORT SURGICAL PATHOLOGY DIAGNOSIS Breast, lumpectomy, Left UOQ Invasive ductal carcinoma, grade 3, and ductal carcinoma in situ Ribbon clip and biopsy site present All margins negative for invasive carcinoma and DCIS Fibrocystic changes Lymph node, sentinel, biopsy One lymph node negative for tumor (0/1)   Estrogen receptor: 0% Progesterone receptor: 0% HER2 negative at 1+ Proliferation marker Ki-67:  40%   Bone density scan 01/27/2022 1.  Osteoporosis of the spine with a T score of -2.5, worse 2.  Left femur - osteoporosis with a T score of -2.7, slightly better 3.  Dual femur total mean-osteoporosis with a T score of -2.5, worse     Allergies:       Allergies  Allergen Reactions   Amoxicillin Hives      vomiting   Celecoxib Hives   Claritin [Loratadine] Other (See Comments)      Pt's daughter notified us of this allergy.   Insulins Nausea And Vomiting      Patient unsure of name of insulin, will call us back   Vioxx [Rofecoxib] Hives   Liraglutide Nausea And Vomiting   Propranolol Nausea And Vomiting      diarrhea diarrhea diarrhea      Current Medications:       Current Outpatient Medications  Medication Sig Dispense Refill   aspirin EC 81 MG tablet Take 81 mg by mouth daily.       atenolol (TENORMIN) 50 MG tablet Take 75 mg by mouth 2 (two) times daily.        busPIRone (BUSPAR) 7.5 MG tablet Take 1 tablet by mouth in the morning, at  noon, and at bedtime.       dexamethasone (DECADRON) 4 MG tablet Take 2 tabs by mouth 2 times daily starting day before chemo. Then take 2 tabs daily for 2 days starting day after chemo. Take with food. 30 tablet 1   DULoxetine (CYMBALTA) 60 MG capsule Take 120 mg by mouth at bedtime.        famotidine (PEPCID) 20 MG tablet Take 20 mg by mouth 2 (two) times daily.       fenofibrate (TRICOR) 145 MG tablet Take 145 mg by mouth daily.       fexofenadine (ALLEGRA) 180 MG tablet Take 180 mg by mouth daily.       gabapentin (NEURONTIN) 600 MG tablet Take 600 mg by mouth 3 (three) times daily.       hydrALAZINE (APRESOLINE) 50 MG tablet Take 50 mg by mouth 3 (three) times daily.       lidocaine-prilocaine (EMLA) cream Apply to affected area once 30 g 3   LORazepam (ATIVAN) 0.5 MG tablet Take 1 tablet (0.5 mg total) by mouth at bedtime as needed for sleep. May take one tab prior to imaging studies due to anxiety 30 tablet 0   Melatonin 1 MG CAPS  Take by mouth. (Patient not taking: Reported on 04/30/2022)       metoprolol tartrate (LOPRESSOR) 25 MG tablet Take 25 mg by mouth 2 (two) times daily.       nicotine (NICODERM CQ - DOSED IN MG/24 HOURS) 21 mg/24hr patch Place 1 patch (21 mg total) onto the skin daily. 28 patch 0   nitroGLYCERIN (NITROSTAT) 0.4 MG SL tablet Place 0.4 mg under the tongue every 5 (five) minutes as needed. For chest pain       ondansetron (ZOFRAN-ODT) 4 MG disintegrating tablet Take 4 mg by mouth every 6 (six) hours as needed.       oxyCODONE (OXY IR/ROXICODONE) 5 MG immediate release tablet Take 5 mg by mouth every 6 (six) hours as needed.       pantoprazole (PROTONIX) 40 MG tablet Take 40 mg by mouth 2 (two) times daily.       potassium chloride (KLOR-CON M) 10 MEQ tablet Take 1 tablet (10 mEq total) by mouth daily. 30 tablet 5   prochlorperazine (COMPAZINE) 10 MG tablet Take 1 tablet (10 mg total) by mouth every 6 (six) hours as needed for nausea or vomiting. 30 tablet 1   tirzepatide (MOUNJARO) 2.5 MG/0.5ML Pen INJECT 2.5 MILLIGRAM EVERY WEEK       TRESIBA FLEXTOUCH 100 UNIT/ML FlexTouch Pen Inject into the skin.       valsartan-hydrochlorothiazide (DIOVAN-HCT) 320-25 MG tablet TAKE 1 TABLET BY ORAL ROUTE ONCE DAILY FOR HIGH BLOOD PRESSURE       zolpidem (AMBIEN) 10 MG tablet Take 10 mg by mouth at bedtime as needed for sleep.  (Patient not taking: Reported on 04/30/2022)        No current facility-administered medications for this visit.         I,Jasmine M Lassiter,acting as a scribe for Lindsay Kaplan, MD.,have documented all relevant documentation on the behalf of Lindsay Kaplan, MD,as directed by  Lindsay Kaplan, MD while in the presence of Lindsay Kaplan, MD.

## 2022-10-02 NOTE — Progress Notes (Signed)
[facility]  [address] [place],  [place]  [address] [phone]   Clinic Day: 10/05/22  Referring physician: Marco Collie, MD   ASSESSMENT & PLAN:  Stage IA Left breast cancer This is a grade 3 triple negative 21 mm tumor for a T1cN0M0 with a Ki-67 of 40%. She has had resection with clear margins and negative sentinel node. However, I recommended adjuvant chemotherapy with docetaxol and cyclophosphamide every 3 weeks for 4 cycles to avoid anthracyclines.  She completed this 07/13/22. She has now completed adjuvant radiation in February, 2024.   Neuropathy She complains of numbness and pain of both legs and she feels unsteady.   Family history of breast cancer and BRCA positivity Yet she is BRCA negative.   Lung nodule This is a ground glass nodule of the right upper lobe which has shown indolent growth from 1.8 cm in April of 2019 to 2.2 cm currently. We will plan to monitor this with repeat imaging periodically.   Liver cirrhosis Liver is decreased in attenuation diffusely with mildly irregular margins. I don't think she was aware of this diagnosis but it may explain her increased toxicities.   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   Memory impairment This is consistent with early dementia. She also has significant hearing impairment.   Hypokalemia CMP reveals a potassium of 3.6, up from 3.0 and so I will order a small amount of  IV potassium to get this corrected. She will continue potassium to 38mq TID.   Plan:  Her chemistries today are pending. She will receive 1 liter normal saline IV tomorrow morning. Her daughter will keep check on her fluids and let me know if she needs any additional before her next appointment. I will see her back in 2 weeks with CBC, CMP, and possible IV fluids.  Once she stabilizes, we can put her on less  frequent follow-up.  She will need a repeat CT of the chest later in the year.  The patient agreed with the plan.  The patient was advised to call back if the symptoms worsen or if the condition fails to improve as anticipated.   I provided 20 minutes of face-to-face time during this this encounter and > 50% was spent counseling as documented under my assessment and plan.    CDerwood Kaplan MD CVinton352 N[address] Southampton RoadSLake [id number]: [phone] Fax: [phone]   CHIEF COMPLAINT:  CC: Invasive ductal carcinoma   Current Treatment:  chemotherapy  HISTORY OF PRESENT ILLNESS:       Oncology History  Breast cancer of upper-outer quadrant of left female breast ([facility]  03/18/2022 Initial Diagnosis    [facility]ast cancer of upper-outer quadrant of left female breast [place]    04/28/2022 Cancer Staging    Staging form: Breast, AJCC 8th Edition - Clinical stage from 04/28/2022: Stage IB (cT1c, cN0(sn), cM0, G3, ER-, PR-, HER2-) - Signed by MDerwood Kaplan MD on 04/28/2022 Histopathologic type: Infiltrating duct carcinoma, NOS Stage prefix: Initial diagnosis Method of lymph node assessment: Sentinel lymph node biopsy Nuclear grade: G3 Multigene prognostic tests performed: None Histologic grading system: 3 grade system Laterality: Left Tumor size (mm): 20 Lymph-vascular invasion (LVI): LVI not present (absent)/not identified Diagnostic confirmation: Positive histology Specimen type: Excision Staged by: Managing physician Menopausal status: Postmenopausal Ki-67 (%): 40  Stage used in treatment planning: Yes National guidelines used in treatment planning: Yes Type of national guideline used in treatment planning: NCCN    05/07/2022 -  Chemotherapy    Patient is on Treatment Plan : BREAST TC q21d     05/19/2022 Genetic Testing    Negative hereditary cancer genetic testing: no pathogenic variant  detected in [facility] Common Hereditary Cancers +RNA Panel.  Report date is May 19, 2022.     The Common Hereditary Cancers + RNA Panel offered by [facility] includes sequencing, deletion/duplication, and RNA testing of the following 47 genes: APC, ATM, AXIN2, BARD1, BMPR1A, BRCA1, BRCA2, BRIP1, CDH1, CDK4*, CDKN2A (p14ARF)*, CDKN2A (p16INK4a)*, CHEK2, CTNNA1, DICER1, EPCAM (Deletion/duplication testing only), GREM1 (promoter region deletion/duplication testing only), KIT, MEN1, MLH1, MSH2, MSH3, MSH6, MUTYH, NBN, NF1, NHTL1, PALB2, PDGFRA*, PMS2, POLD1, POLE, PTEN, RAD50, RAD51C, RAD51D, SDHB, SDHC, SDHD, SMAD4, SMARCA4. STK11, TP53, TSC1, TSC2, and VHL.  The following genes were evaluated for sequence changes only: SDHA and HOXB13 c.251G>A variant only.  RNA analysis is not performed for the * genes.      Lindsay Brooks is a 72 year old woman who had a screening mammogram on 01/27/2022 and was found to have a possible left breast mass. She does get yearly mammograms due to her strong family history. She thought  she could feel a lump after this was found. She had a diagnostic mammogram on 02/11/2022 with a persistent spiculated hyperdense mass with associated calcifications in the upper inner quadrant. Ultrasound confirmed an irregular hypoechoic mass with peripheral vascularity at 11:30, 3 cm from the nipple. This measures 2.2 cm and the left axilla was negative. She was referred to [name] after a biopsy was done on 02/17/2022, which confirmed a grade 3 invasive ductal carcinoma which is triple negative with a Ki-67 of 40%. [name] took her to surgery on 03/18/2022 for lumpectomy and sentinel lymph node. The final pathology revealed a 20 mm grade 3 invasive ductal carcinoma with DCIS. The margins were clear and the lymph node was negative for a T1cN0M0, stage IA. She has had a port placed.   INTERVAL HISTORY:  Shadasia is seen here today for invasive ductal carcinoma for continued supportive care after completing  chemotherapy in December. Patient states that she feels well and has no complaints. She has completed radiation last week and still has severe hyperpigmentation of the left breast. The radiated skin under her breast is peeling more but she is able to wear a bra today for the first time in a while.  Her CBC's look good. Her chemistries are pending today. She continues to try to drink water and limited soda. She is scheduled to receive IV fluids on 10/06/2022. Her daughter will keep check on her fluids and let me know if she needs any additional before her next appointment. I will see her back in 2 weeks with CBC, CMP, and possible IV fluids. She denies signs of infection such as sore throat, sinus drainage, cough, or urinary symptoms.  She denies fevers or recurrent chills. She denies pain. She denies nausea, vomiting, chest pain, dyspnea or cough. Her appetite is ok and her weight has been stable. She is accompanied at today's visit with her daughter.   HISTORY:  SOCIAL HISTORY She lives with her first daughter. She has 2 daughters. She smokes 1.5 packs a day, and has been smoking since 72 yo. She is not sure about quitting. She did smoke 2 packs per day for over 50 years. She doesn't drink  other than rare occasion. She is widowed. She has been married 4 times. She is disabled.   Menstrual History 72 years old when she had her first child She has had 3 children 50 years old when starting menstrual period Menopause started at 53 She has had a partial hysterectomy in her 40's and went back for complete hysterectomy/BSO later. No hormone replacement therapy   Family History  Problem Relation Age of Onset   Heart attack Mother    Allergies Mother    Heart attack Father    Allergies Father    Kidney disease Father    Hypertension Father    Melanoma Sister    Coronary artery disease Brother    Breast cancer Maternal Aunt    Breast cancer Maternal Aunt    Breast cancer Paternal Aunt        d.  under 6   Pancreatic cancer Maternal Grandmother    Breast cancer Daughter        BRCA1+  Her sister had a brain tumor at age 97    Past Surgical History:  Procedure Laterality Date   APPENDECTOMY     BREAST BIOPSY     CARDIAC CATHETERIZATION  2009   patent RCA stent. mild CAD otherwise.    CATARACT EXTRACTION, BILATERAL  2016   CHOLECYSTECTOMY  2009   hysterectomy (other)     age 68   KNEE ARTHROSCOPY Left    left arm surgery     LEFT HEART CATHETERIZATION WITH CORONARY ANGIOGRAM N/A 01/16/2014   Procedure: LEFT HEART CATHETERIZATION WITH CORONARY ANGIOGRAM;  Surgeon: Blane Ohara, MD;  Location: [facility] CATH LAB;  Service: Cardiovascular;  Laterality: N/A;   left leg angiography     stenting of the left common iliac artery   left rotator cuff repair     right coronary artery stent     Past Medical History:  Diagnosis Date   CAD (coronary artery disease)    a. s/p 2 CoStar DES to RCA 06/2004. b.  LHC 12/09:  EF 65%, oCFX 40%, oRCA 30%, then 30-40% leading into stented seg that is patent, (10-20% ISR), 40% beyond stent,;  c. EF 81%, poor exercise capacity, hypotensive BP response, + chest pain, normal nuclear images (overall low risk)   CKD (chronic kidney disease)    Colon polyps    a. s/p removal 01/2012 - was told one might be cancerous but was not diagnosed with colon cancer - plan is for f/u colonoscopy in 5 yrs.   GERD (gastroesophageal reflux disease)    Hx of echocardiogram    a. Echo 06/2011: EF 55-60%, Gr 1 diast dysfn, mild MR   Insomnia    Mini stroke    History of, in 2005 (before she was diagnosed with CAD).   Osteoporosis    Other and unspecified hyperlipidemia    Other isolated or specific phobias    Peripheral vascular disease, unspecified ([facility])    a. s/p L common femoral & left illiac stenting previously (patent by angio in 2007). - ABI 06/2011 normal. b. Carotid US 0-39% BICA 06/2011 (for f/u 2 yrs);   c.  ABIs 9/13:  normal (1.1 bilaterally)   Personal  history of peptic ulcer disease    Remotely.   Psychiatric pseudoseizure    per patient and family   Tobacco use disorder    Type II or unspecified type diabetes mellitus without mention of complication, not stated as uncontrolled    Unspecified essential hypertension  REVIEW OF SYSTEMS:  Review of Systems  Constitutional: Negative.  Negative for appetite change, chills, diaphoresis, fatigue, fever and unexpected weight change.  HENT:   Positive for hearing loss. Negative for lump/mass, mouth sores, nosebleeds, sore throat, tinnitus, trouble swallowing and voice change.   Eyes: Negative.  Negative for eye problems and icterus.  Respiratory: Negative.  Negative for chest tightness, cough, hemoptysis, shortness of breath and wheezing.   Cardiovascular: Negative.  Negative for chest pain, leg swelling and palpitations.  Gastrointestinal: Negative.  Negative for abdominal distention, abdominal pain, blood in stool, constipation, diarrhea, nausea, rectal pain and vomiting.  Endocrine: Negative.   Genitourinary: Negative.  Negative for bladder incontinence, difficulty urinating, dyspareunia, dysuria, frequency, hematuria, menstrual problem, nocturia, pelvic pain, vaginal bleeding and vaginal discharge.   Musculoskeletal:  Negative for arthralgias, back pain, flank pain, gait problem, myalgias, neck pain and neck stiffness.  Skin: Negative.  Negative for itching, rash and wound.  Neurological:  Positive for light-headedness. Negative for dizziness, extremity weakness, gait problem, headaches, numbness, seizures and speech difficulty.  Hematological: Negative.  Negative for adenopathy. Does not bruise/bleed easily.  Psychiatric/Behavioral:  Positive for sleep disturbance. Negative for confusion, decreased concentration, depression and suicidal ideas. The patient is not nervous/anxious.    VITALS:  Blood pressure (!) 112/55, pulse (!) 119, temperature 97.8 F (36.6 C), temperature source Oral,  resp. rate 20, height '5\' 1"'$  (1.549 m), weight 137 lb 1.6 oz (62.2 kg), SpO2 96 %.     Wt Readings from Last 3 Encounters:  06/24/22 140 lb (63.5 kg)  06/22/22 132 lb 1.3 oz (59.9 kg)  06/18/22 137 lb 1.6 oz (62.2 kg)    Body mass index is 25.9 kg/m.   Performance status (ECOG): 0 - Asymptomatic   PHYSICAL EXAM:  Physical Exam Vitals and nursing note reviewed. Exam conducted with a chaperone present.  Constitutional:      General: She is not in acute distress.    Appearance: Normal appearance. She is normal weight. She is not ill-appearing, toxic-appearing or diaphoretic.  HENT:     Head: Normocephalic and atraumatic.     Right Ear: Tympanic membrane, ear canal and external ear normal. There is no impacted cerumen.     Left Ear: Tympanic membrane, ear canal and external ear normal. There is no impacted cerumen.     Nose: Nose normal. No congestion or rhinorrhea.     Mouth/Throat:     Mouth: Mucous membranes are moist.     Pharynx: Oropharynx is clear. No oropharyngeal exudate or posterior oropharyngeal erythema.  Eyes:     General: No scleral icterus.       Right eye: No discharge.        Left eye: No discharge.     Extraocular Movements: Extraocular movements intact.     Conjunctiva/sclera: Conjunctivae normal.     Pupils: Pupils are equal, round, and reactive to light.  Neck:     Vascular: No carotid bruit.  Cardiovascular:     Rate and Rhythm: Normal rate and regular rhythm.     Pulses: Normal pulses.     Heart sounds: Normal heart sounds. No murmur heard.    No friction rub. No gallop.  Pulmonary:     Effort: Pulmonary effort is normal. No respiratory distress.     Breath sounds: Normal breath sounds. No stridor. No wheezing, rhonchi or rales.  Chest:     Chest wall: No tenderness.     Comments: The left breast has extensive radiation changes  and hyperpigmentation but no peeling. There are no masses in either breast.  Abdominal:     General: Bowel sounds are normal.  There is no distension.     Palpations: Abdomen is soft. There is no hepatomegaly, splenomegaly or mass.     Tenderness: There is no abdominal tenderness. There is no right CVA tenderness, left CVA tenderness, guarding or rebound.     Hernia: No hernia is present.  Musculoskeletal:        General: No swelling, tenderness, deformity or signs of injury. Normal range of motion.     Cervical back: Normal range of motion and neck supple. No rigidity or tenderness.     Right lower leg: No edema.     Left lower leg: No edema.  Lymphadenopathy:     Cervical: No cervical adenopathy.     Right cervical: No superficial, deep or posterior cervical adenopathy.    Left cervical: No superficial, deep or posterior cervical adenopathy.     Upper Body:     Right upper body: No supraclavicular, axillary or pectoral adenopathy.     Left upper body: No supraclavicular, axillary or pectoral adenopathy.  Skin:    General: Skin is warm and dry.     Coloration: Skin is not jaundiced or pale.     Findings: No bruising, erythema, lesion or rash.     Comments: Decreased trugor  Neurological:     General: No focal deficit present.     Mental Status: She is alert and oriented to person, place, and time. Mental status is at baseline.     Cranial Nerves: No cranial nerve deficit.     Sensory: No sensory deficit.     Motor: No weakness.     Coordination: Coordination normal.     Gait: Gait normal.     Deep Tendon Reflexes: Reflexes normal.  Psychiatric:        Mood and Affect: Mood normal.        Behavior: Behavior normal.        Thought Content: Thought content normal.        Judgment: Judgment normal.    LABS:        Latest Ref Rng & Units 06/18/2022   12:00 AM 05/26/2022   12:00 AM 05/18/2022   12:00 AM  CBC  WBC   11.5     19.1     33.5      Hemoglobin 12.0 - 16.0 12.5     12.2     14.9      Hematocrit 36 - 46 38     37     45      Platelets 150 - 400 K/uL 234     238     247                Component Ref Range & Units 2 wk ago (09/07/22) 1 mo ago (08/21/22) 1 mo ago (08/07/22) 2 mo ago (07/09/22)  Hemoglobin 12.0 - 16.0 13.0 13.2 13.1 10.4 Abnormal   HCT 36 - 46 40 40 40 31 Abnormal   Neutrophils Absolute  5.31 6.44 8.32 9.83  Platelets 150 - 400 K/uL 181 210 205 239  WBC  7.7 9.2 10.4 12.6    This result is from an external source.     Component Ref Range & Units 9 d ago (09/07/22) 3 wk ago (08/21/22) 1 mo ago (08/07/22)  Glucose  163 144 153  BUN 4 - 21 13 14  18  CO2 13 - 22 27 Abnormal  18 18  Creatinine 0.5 - 1.1 0.9 0.9 1.2 Abnormal   Potassium 3.5 - 5.1 mEq/L 3.0 Abnormal  3.0 Abnormal  3.0 Abnormal   Sodium 137 - 147 137 138 139  Chloride 99 - 108 100 104    Component Ref Range & Units 9 d ago (09/07/22) 3 wk ago (08/21/22) 1 mo ago (08/07/22) 2 mo ago (07/09/22)  Calcium 8.7 - 10.7 9.2 9.0 9.4 8.6 Low  R  Albumin 3.5 - 5.0 3.7 3.9 4.0      Component Ref Range & Units 9 d ago (09/07/22) 3 wk ago (08/21/22) 1 mo ago (08/07/22)  Alkaline Phosphatase 25 - 125 79 87 94  ALT 7 - 35 U/L '10 13 13  '$ AST 13 - 35 '16 21 20  '$ Bilirubin, Total  0.3 0.4 0.6       Latest Ref Rng & Units 06/18/2022   12:00 AM 05/26/2022   12:00 AM 05/18/2022   12:00 AM  CMP  BUN 4 - '21 7     15     '$ 37      Creatinine 0.5 - 1.1 0.8     1.0     1.3      Sodium 137 - 147 133     136     138      Potassium 3.5 - 5.1 mEq/L 3.3     3.6     3.0      Chloride 99 - 108 98     104     103      CO2 13 - '22 23     27     21      '$ Calcium 8.7 - 10.7 9.0     8.6     9.0      Total Protein 6.3 - 8.2 g/dL   6        Alkaline Phos 25 - 125 105     71     120      AST 13 - 35 '24     20     31      '$ ALT 7 - 35 U/L '18     18     22         '$ This result is from an external source.    CMP    Component Ref Range & Units 4 wk ago (07/09/22)  Sodium 135 - 145 mmol/L 141  Potassium 3.5 - 5.1 mmol/L 4.4  Chloride 98 - 111 mmol/L 108  CO2 22 - 32 mmol/L 25  Glucose, Bld 70 - 99 mg/dL 184 High      BUN 8 - 23 mg/dL 11  Creatinine 0.44 - 1.00 mg/dL 0.90  Calcium 8.9 - 10.3 mg/dL 8.6 Low   Total Protein 6.5 - 8.1 g/dL 5.4 Low   Albumin 3.5 - 5.0 g/dL 2.8 Low   AST 15 - 41 U/L 15  ALT 0 - 44 U/L 8       Recent Labs  No results found for: "CEA1", "CEA"   /  Last Labs  No results found for: "CEA1", "CEA"   Recent Labs  No results found for: "PSA1"   Recent Labs  No results found for: "[id number]"   Recent Labs  No results found for: "CAN125"    Recent Labs  No results found for: "TOTALPROTELP", "ALBUMINELP", "A1GS", "A2GS", "BETS", "BETA2SER", "GAMS", "MSPIKE", "SPEI"  Recent Labs  No results found for: "TIBC", "FERRITIN", "IRONPCTSAT"   Recent Labs  No results found for: "LDH"     STUDIES:  EXAM:05/05/22 NUCLEAR MEDICINE WHOLE BODY BONE SCAN IMPRESSION: Uptake at LEFT tibial metaphysis especially medially, nonspecific; radiographic correlation recommended to exclude metastatic focus.   Focal abnormal tracer uptake at the LEFT temporo-occipital calvarium, corresponding to and focus of abnormal marrow signal on prior MR head exams back to 2010, nonspecific but likely benign.   No additional scintigraphic findings to suggest osseous metastatic disease.   Diffuse calvarial uptake, favoring metabolic bone disease.    EXAM:05/05/22 CT CHEST WITH CONTRAST IMPRESSION: Ground glass nodule in the right upper lobe with a possible developing internal solid component. Lesion has shown indolent growth from 11/11/2017, at which time it measured approcimately 1.2 x 1.8 cm. Findings are worrisome for adenocarcinoma.  No definitive evidence of metastatic breast cancer. Steatotic cirrhotic liver   EXAM:03/18/2022 REPORT SURGICAL PATHOLOGY DIAGNOSIS Breast, lumpectomy, Left UOQ Invasive ductal carcinoma, grade 3, and ductal carcinoma in situ Ribbon clip and biopsy site present All margins negative for invasive carcinoma and DCIS Fibrocystic changes Lymph node, sentinel,  biopsy One lymph node negative for tumor (0/1)   Estrogen receptor: 0% Progesterone receptor: 0% HER2 negative at 1+ Proliferation marker Ki-67: 40%   Bone density scan 01/27/2022 1.  Osteoporosis of the spine with a T score of -2.5, worse 2.  Left femur - osteoporosis with a T score of -2.7, slightly better 3.  Dual femur total mean-osteoporosis with a T score of -2.5, worse     Allergies  Allergen Reactions   Amoxicillin Hives    vomiting   Claritin [Loratadine] Other (See Comments)    Pt's daughter notified us of this allergy.   Insulins Nausea And Vomiting and Other (See Comments)    Patient unsure of name of insulin, will call us back   Vioxx [Rofecoxib] Hives   Celecoxib Hives and Nausea And Vomiting   Liraglutide Nausea And Vomiting and Other (See Comments)   Propranolol Nausea And Vomiting and Other (See Comments)    diarrhea   Current Outpatient Medications on File Prior to Visit  Medication Sig Dispense Refill   aspirin EC 81 MG tablet Take 81 mg by mouth daily.     atenolol (TENORMIN) 50 MG tablet Take 75 mg by mouth 2 (two) times daily.      busPIRone (BUSPAR) 7.5 MG tablet Take 1 tablet by mouth in the morning, at noon, and at bedtime.     clopidogrel (PLAVIX) 75 MG tablet Take 75 mg by mouth daily.     DULoxetine (CYMBALTA) 60 MG capsule Take 120 mg by mouth at bedtime.      famotidine (PEPCID) 20 MG tablet Take 20 mg by mouth 2 (two) times daily.     fenofibrate (TRICOR) 145 MG tablet Take 145 mg by mouth daily.     fexofenadine (ALLEGRA) 180 MG tablet Take 180 mg by mouth daily.     gabapentin (NEURONTIN) 600 MG tablet Take 600 mg by mouth 3 (three) times daily.     hydrALAZINE (APRESOLINE) 50 MG tablet Take 50 mg by mouth 3 (three) times daily.     HYDROcodone-acetaminophen (NORCO) 5-325 MG tablet Take 1 tablet by mouth every 4 (four) hours as needed for moderate pain. 30 tablet 0   LANTUS SOLOSTAR 100 UNIT/ML Solostar Pen Inject into the skin.     LORazepam  (ATIVAN) 0.5 MG tablet Take 1 tablet (0.5 mg total)  by mouth at bedtime as needed for sleep. May take one tab prior to imaging studies due to anxiety 30 tablet 0   Melatonin 1 MG CAPS Take by mouth. (Patient not taking: Reported on 04/30/2022)     metoprolol tartrate (LOPRESSOR) 25 MG tablet Take 25 mg by mouth 2 (two) times daily.     MOUNJARO 5 MG/0.5ML Pen SMARTSIG:0.5 Milliliter(s) SUB-Q Once a Week     nicotine (NICODERM CQ - DOSED IN MG/24 HOURS) 21 mg/24hr patch Place 1 patch (21 mg total) onto the skin daily. (Patient not taking: Reported on 08/21/2022) 28 patch 0   nitroGLYCERIN (NITROSTAT) 0.4 MG SL tablet Place 0.4 mg under the tongue every 5 (five) minutes as needed. For chest pain     ondansetron (ZOFRAN-ODT) 4 MG disintegrating tablet Take 4 mg by mouth every 6 (six) hours as needed.     pantoprazole (PROTONIX) 40 MG tablet Take 40 mg by mouth 2 (two) times daily.     potassium chloride (KLOR-CON M) 10 MEQ tablet Take 1 tablet (10 mEq total) by mouth 3 (three) times daily. 90 tablet 5   TRESIBA FLEXTOUCH 100 UNIT/ML FlexTouch Pen Inject into the skin.     valsartan-hydrochlorothiazide (DIOVAN-HCT) 320-25 MG tablet TAKE 1 TABLET BY ORAL ROUTE ONCE DAILY FOR HIGH BLOOD PRESSURE     zolpidem (AMBIEN) 10 MG tablet Take 10 mg by mouth at bedtime as needed for sleep.  (Patient not taking: Reported on 04/30/2022)     No current facility-administered medications on file prior to visit.     I,Jasmine M Lassiter,acting as a scribe for Derwood Kaplan, MD.,have documented all relevant documentation on the behalf of Derwood Kaplan, MD,as directed by  Derwood Kaplan, MD while in the presence of Derwood Kaplan, MD.

## 2022-10-05 ENCOUNTER — Inpatient Hospital Stay (INDEPENDENT_AMBULATORY_CARE_PROVIDER_SITE_OTHER): Payer: 59 | Admitting: Oncology

## 2022-10-05 ENCOUNTER — Inpatient Hospital Stay: Payer: 59

## 2022-10-05 ENCOUNTER — Other Ambulatory Visit: Payer: Self-pay | Admitting: Oncology

## 2022-10-05 ENCOUNTER — Encounter: Payer: Self-pay | Admitting: Oncology

## 2022-10-05 VITALS — BP 136/68 | HR 89 | Temp 98.0°F | Resp 19 | Ht 61.0 in | Wt 120.3 lb

## 2022-10-05 DIAGNOSIS — D649 Anemia, unspecified: Secondary | ICD-10-CM | POA: Diagnosis not present

## 2022-10-05 DIAGNOSIS — Z171 Estrogen receptor negative status [ER-]: Secondary | ICD-10-CM | POA: Diagnosis not present

## 2022-10-05 DIAGNOSIS — C50412 Malignant neoplasm of upper-outer quadrant of left female breast: Secondary | ICD-10-CM | POA: Diagnosis not present

## 2022-10-05 DIAGNOSIS — E860 Dehydration: Secondary | ICD-10-CM

## 2022-10-05 LAB — HEPATIC FUNCTION PANEL
ALT: 12 U/L (ref 7–35)
AST: 25 (ref 13–35)
Alkaline Phosphatase: 76 (ref 25–125)
Bilirubin, Total: 0.3

## 2022-10-05 LAB — COMPREHENSIVE METABOLIC PANEL
Albumin: 3.9 (ref 3.5–5.0)
Calcium: 9.2 (ref 8.7–10.7)

## 2022-10-05 LAB — CBC AND DIFFERENTIAL
HCT: 42 (ref 36–46)
Hemoglobin: 13.7 (ref 12.0–16.0)
Neutrophils Absolute: 5.1
Platelets: 154 10*3/uL (ref 150–400)
WBC: 7.5

## 2022-10-05 LAB — CBC: RBC: 4.6 (ref 3.87–5.11)

## 2022-10-05 LAB — BASIC METABOLIC PANEL
BUN: 19 (ref 4–21)
CO2: 24 — AB (ref 13–22)
Chloride: 106 (ref 99–108)
Creatinine: 1 (ref 0.5–1.1)
Glucose: 110
Potassium: 3.2 mEq/L — AB (ref 3.5–5.1)
Sodium: 140 (ref 137–147)

## 2022-10-06 ENCOUNTER — Inpatient Hospital Stay: Payer: 59

## 2022-10-06 DIAGNOSIS — Z6823 Body mass index (BMI) 23.0-23.9, adult: Secondary | ICD-10-CM | POA: Diagnosis not present

## 2022-10-06 DIAGNOSIS — R197 Diarrhea, unspecified: Secondary | ICD-10-CM | POA: Diagnosis not present

## 2022-10-07 ENCOUNTER — Inpatient Hospital Stay: Payer: 59

## 2022-10-07 VITALS — BP 120/77 | HR 108 | Temp 98.0°F | Resp 18

## 2022-10-07 DIAGNOSIS — R569 Unspecified convulsions: Secondary | ICD-10-CM | POA: Diagnosis not present

## 2022-10-07 DIAGNOSIS — Z79899 Other long term (current) drug therapy: Secondary | ICD-10-CM | POA: Diagnosis not present

## 2022-10-07 DIAGNOSIS — C50412 Malignant neoplasm of upper-outer quadrant of left female breast: Secondary | ICD-10-CM | POA: Diagnosis not present

## 2022-10-07 DIAGNOSIS — Z171 Estrogen receptor negative status [ER-]: Secondary | ICD-10-CM | POA: Diagnosis not present

## 2022-10-07 DIAGNOSIS — Z794 Long term (current) use of insulin: Secondary | ICD-10-CM | POA: Diagnosis not present

## 2022-10-07 DIAGNOSIS — R197 Diarrhea, unspecified: Secondary | ICD-10-CM | POA: Diagnosis not present

## 2022-10-07 DIAGNOSIS — Z7982 Long term (current) use of aspirin: Secondary | ICD-10-CM | POA: Diagnosis not present

## 2022-10-07 DIAGNOSIS — E119 Type 2 diabetes mellitus without complications: Secondary | ICD-10-CM | POA: Diagnosis not present

## 2022-10-07 DIAGNOSIS — I10 Essential (primary) hypertension: Secondary | ICD-10-CM | POA: Diagnosis not present

## 2022-10-07 DIAGNOSIS — Z803 Family history of malignant neoplasm of breast: Secondary | ICD-10-CM | POA: Diagnosis not present

## 2022-10-07 DIAGNOSIS — F17210 Nicotine dependence, cigarettes, uncomplicated: Secondary | ICD-10-CM | POA: Diagnosis not present

## 2022-10-07 DIAGNOSIS — J449 Chronic obstructive pulmonary disease, unspecified: Secondary | ICD-10-CM | POA: Diagnosis not present

## 2022-10-07 DIAGNOSIS — E876 Hypokalemia: Secondary | ICD-10-CM | POA: Diagnosis not present

## 2022-10-07 DIAGNOSIS — E860 Dehydration: Secondary | ICD-10-CM

## 2022-10-07 DIAGNOSIS — R911 Solitary pulmonary nodule: Secondary | ICD-10-CM | POA: Diagnosis not present

## 2022-10-07 DIAGNOSIS — Z7952 Long term (current) use of systemic steroids: Secondary | ICD-10-CM | POA: Diagnosis not present

## 2022-10-07 DIAGNOSIS — E785 Hyperlipidemia, unspecified: Secondary | ICD-10-CM | POA: Diagnosis not present

## 2022-10-07 MED ORDER — SODIUM CHLORIDE 0.9 % IV SOLN: Freq: Once | INTRAVENOUS | Status: AC

## 2022-10-07 NOTE — Patient Instructions (Signed)
Dehydration, Adult Dehydration is a condition in which there is not enough water or other fluids in the body. This happens when a person loses more fluids than they take in. Important organs cannot work right without the right amount of fluids. Any loss of fluids from the body can cause dehydration. Dehydration can be mild, worse, or very bad. It should be treated right away to keep it from getting very bad. What are the causes? Conditions that cause loss of water in the body. They include: Watery poop (diarrhea). Vomiting. Sweating a lot. Fever. Infection. Peeing (urinating) a lot. Not drinking enough fluids. Certain medicines, such as medicines that take extra fluid out of the body (diuretics). Lack of safe drinking water. Not being able to get enough water and food. What increases the risk? Having a long-term (chronic) illness that has not been treated the right way, such as: Diabetes. Heart disease. Kidney disease. Being 62 years of age or older. Having a disability. Living in a place that is high above the ground or sea (high in altitude). The thinner, drier air causes more fluid loss. Doing exercises that put stress on your body for a long time. Being active when in hot places. What are the signs or symptoms? Symptoms of dehydration depend on how bad it is. Mild or worse dehydration Thirst. Dry lips or dry mouth. Feeling dizzy or light-headed. Muscle cramps. Passing little pee or dark pee. Pee may be the color of tea. Headache. Very bad dehydration Changes in skin. Skin may: Be cold to the touch (clammy). Be blotchy or pale. Not go back to normal right after you pinch it and let it go. Little or no tears, pee, or sweat. Fast breathing. Low blood pressure. Weak pulse. Pulse that is more than 100 beats a minute when you are sitting still. Other changes, such as: Feeling very thirsty. Eyes that look hollow (sunken). Cold hands and feet. Being confused. Being very  tired (lethargic) or having trouble waking from sleep. Losing weight. Loss of consciousness. How is this treated? Treatment for this condition depends on how bad your dehydration is. Treatment should start right away. Do not wait until your condition gets very bad. Very bad dehydration is an emergency. You will need to go to a hospital. Mild or worse dehydration can be treated at home. You may be asked to: Drink more fluids. Drink an oral rehydration solution (ORS). This drink gives you the right amount of fluids, salts, and minerals (electrolytes). Very bad dehydration can be treated: With fluids through an IV tube. By correcting low levels of electrolytes in the body. By treating the problem that caused your dehydration. Follow these instructions at home: Oral rehydration solution If told by your doctor, drink an ORS: Make an ORS. Use instructions on the package. Start by drinking small amounts, about  cup (120 mL) every 5-10 minutes. Slowly drink more until you have had the amount that your doctor said to have.  Eating and drinking  Drink enough clear fluid to keep your pee pale yellow. If you were told to drink an ORS, finish the ORS first. Then, start slowly drinking other clear fluids. Drink fluids such as: Water. Do not drink only water. Doing that can make the salt (sodium) level in your body get too low. Water from ice chips you suck on. Fruit juice that you have added water to (diluted). Low-calorie sports drinks. Eat foods that have the right amounts of salts and minerals, such as bananas, oranges, potatoes,  tomatoes, or spinach. Do not drink alcohol. Avoid drinks that have caffeine or sugar. These include:: High-calorie sports drinks. Fruit juice that you did not add water to. Soda. Coffee or energy drinks. Avoid foods that are greasy or have a lot of fat or sugar. General instructions Take over-the-counter and prescription medicines only as told by your doctor. Do  not take sodium tablets. Doing that can make the salt level in your body get too high. Return to your normal activities as told by your doctor. Ask your doctor what activities are safe for you. Keep all follow-up visits. Your doctor may check and change your treatment. Contact a doctor if: You have pain in your belly (abdomen) and the pain: Gets worse. Stays in one place. You have a rash. You have a stiff neck. You get angry or annoyed more easily than normal. You are more tired or have a harder time waking than normal. You feel weak or dizzy. You feel very thirsty. Get help right away if: You have any symptoms of very bad dehydration. You vomit every time you eat or drink. Your vomiting gets worse, does not go away, or you vomit blood or green stuff. You are getting treatment, but symptoms are getting worse. You have a fever. You have a very bad headache. You have: Diarrhea that gets worse or does not go away. Blood in your poop (stool). This may cause poop to look black and tarry. No pee in 6-8 hours. Only a small amount of pee in 6-8 hours, and the pee is very dark. You have trouble breathing. These symptoms may be an emergency. Get help right away. Call 911. Do not wait to see if the symptoms will go away. Do not drive yourself to the hospital. This information is not intended to replace advice given to you by your health care provider. Make sure you discuss any questions you have with your health care provider. Document Revised: 02/23/2022 Document Reviewed: 02/23/2022 Else[employer] Patient Education  [place].

## 2022-10-09 ENCOUNTER — Encounter: Payer: Self-pay | Admitting: Oncology

## 2022-10-09 DIAGNOSIS — E785 Hyperlipidemia, unspecified: Secondary | ICD-10-CM | POA: Diagnosis not present

## 2022-10-09 DIAGNOSIS — E1169 Type 2 diabetes mellitus with other specified complication: Secondary | ICD-10-CM | POA: Diagnosis not present

## 2022-10-09 DIAGNOSIS — I10 Essential (primary) hypertension: Secondary | ICD-10-CM | POA: Diagnosis not present

## 2022-10-12 ENCOUNTER — Encounter: Payer: Self-pay | Admitting: Oncology

## 2022-10-16 ENCOUNTER — Encounter: Payer: Self-pay | Admitting: Oncology

## 2022-10-16 DIAGNOSIS — E1165 Type 2 diabetes mellitus with hyperglycemia: Secondary | ICD-10-CM | POA: Diagnosis not present

## 2022-10-16 DIAGNOSIS — I10 Essential (primary) hypertension: Secondary | ICD-10-CM | POA: Diagnosis not present

## 2022-10-19 ENCOUNTER — Emergency Department (HOSPITAL_COMMUNITY): Payer: 59

## 2022-10-19 ENCOUNTER — Ambulatory Visit: Payer: 59 | Admitting: Oncology

## 2022-10-19 ENCOUNTER — Other Ambulatory Visit: Payer: Self-pay

## 2022-10-19 ENCOUNTER — Encounter (HOSPITAL_COMMUNITY): Payer: Self-pay

## 2022-10-19 ENCOUNTER — Inpatient Hospital Stay (HOSPITAL_COMMUNITY)
Admission: EM | Admit: 2022-10-19 | Discharge: 2022-10-22 | DRG: 394 | Disposition: A | Discharge status: Home / Self Care | Payer: 59 | Attending: Internal Medicine | Admitting: Internal Medicine

## 2022-10-19 DIAGNOSIS — Z79899 Other long term (current) drug therapy: Secondary | ICD-10-CM

## 2022-10-19 DIAGNOSIS — Z8249 Family history of ischemic heart disease and other diseases of the circulatory system: Secondary | ICD-10-CM

## 2022-10-19 DIAGNOSIS — L89312 Pressure ulcer of right buttock, stage 2: Secondary | ICD-10-CM | POA: Diagnosis present

## 2022-10-19 DIAGNOSIS — Z803 Family history of malignant neoplasm of breast: Secondary | ICD-10-CM

## 2022-10-19 DIAGNOSIS — Z7902 Long term (current) use of antithrombotics/antiplatelets: Secondary | ICD-10-CM | POA: Diagnosis not present

## 2022-10-19 DIAGNOSIS — E782 Mixed hyperlipidemia: Secondary | ICD-10-CM | POA: Diagnosis not present

## 2022-10-19 DIAGNOSIS — K219 Gastro-esophageal reflux disease without esophagitis: Secondary | ICD-10-CM | POA: Diagnosis present

## 2022-10-19 DIAGNOSIS — Z720 Tobacco use: Secondary | ICD-10-CM | POA: Diagnosis present

## 2022-10-19 DIAGNOSIS — F411 Generalized anxiety disorder: Secondary | ICD-10-CM | POA: Diagnosis not present

## 2022-10-19 DIAGNOSIS — Z955 Presence of coronary angioplasty implant and graft: Secondary | ICD-10-CM

## 2022-10-19 DIAGNOSIS — K922 Gastrointestinal hemorrhage, unspecified: Secondary | ICD-10-CM | POA: Diagnosis not present

## 2022-10-19 DIAGNOSIS — I2510 Atherosclerotic heart disease of native coronary artery without angina pectoris: Secondary | ICD-10-CM | POA: Diagnosis present

## 2022-10-19 DIAGNOSIS — K760 Fatty (change of) liver, not elsewhere classified: Secondary | ICD-10-CM | POA: Diagnosis not present

## 2022-10-19 DIAGNOSIS — Z841 Family history of disorders of kidney and ureter: Secondary | ICD-10-CM | POA: Diagnosis not present

## 2022-10-19 DIAGNOSIS — Z800 Family history of malignant neoplasm of digestive organs: Secondary | ICD-10-CM

## 2022-10-19 DIAGNOSIS — Z8673 Personal history of transient ischemic attack (TIA), and cerebral infarction without residual deficits: Secondary | ICD-10-CM

## 2022-10-19 DIAGNOSIS — M810 Age-related osteoporosis without current pathological fracture: Secondary | ICD-10-CM | POA: Diagnosis present

## 2022-10-19 DIAGNOSIS — Z7982 Long term (current) use of aspirin: Secondary | ICD-10-CM | POA: Diagnosis not present

## 2022-10-19 DIAGNOSIS — Z794 Long term (current) use of insulin: Secondary | ICD-10-CM

## 2022-10-19 DIAGNOSIS — R109 Unspecified abdominal pain: Secondary | ICD-10-CM | POA: Diagnosis not present

## 2022-10-19 DIAGNOSIS — I2583 Coronary atherosclerosis due to lipid rich plaque: Secondary | ICD-10-CM | POA: Diagnosis not present

## 2022-10-19 DIAGNOSIS — F17210 Nicotine dependence, cigarettes, uncomplicated: Secondary | ICD-10-CM | POA: Diagnosis not present

## 2022-10-19 DIAGNOSIS — E119 Type 2 diabetes mellitus without complications: Secondary | ICD-10-CM | POA: Diagnosis not present

## 2022-10-19 DIAGNOSIS — D62 Acute posthemorrhagic anemia: Secondary | ICD-10-CM | POA: Diagnosis not present

## 2022-10-19 DIAGNOSIS — U071 COVID-19: Secondary | ICD-10-CM | POA: Diagnosis not present

## 2022-10-19 DIAGNOSIS — R1012 Left upper quadrant pain: Secondary | ICD-10-CM | POA: Diagnosis not present

## 2022-10-19 DIAGNOSIS — E785 Hyperlipidemia, unspecified: Secondary | ICD-10-CM | POA: Diagnosis not present

## 2022-10-19 DIAGNOSIS — J301 Allergic rhinitis due to pollen: Secondary | ICD-10-CM | POA: Diagnosis not present

## 2022-10-19 DIAGNOSIS — Z880 Allergy status to penicillin: Secondary | ICD-10-CM

## 2022-10-19 DIAGNOSIS — I700 Atherosclerosis of aorta: Secondary | ICD-10-CM | POA: Diagnosis not present

## 2022-10-19 DIAGNOSIS — Z886 Allergy status to analgesic agent status: Secondary | ICD-10-CM | POA: Diagnosis not present

## 2022-10-19 DIAGNOSIS — K644 Residual hemorrhoidal skin tags: Principal | ICD-10-CM | POA: Diagnosis present

## 2022-10-19 DIAGNOSIS — F1021 Alcohol dependence, in remission: Secondary | ICD-10-CM | POA: Diagnosis present

## 2022-10-19 DIAGNOSIS — Z9049 Acquired absence of other specified parts of digestive tract: Secondary | ICD-10-CM

## 2022-10-19 DIAGNOSIS — E876 Hypokalemia: Secondary | ICD-10-CM | POA: Diagnosis not present

## 2022-10-19 DIAGNOSIS — I10 Essential (primary) hypertension: Secondary | ICD-10-CM | POA: Diagnosis present

## 2022-10-19 DIAGNOSIS — Z808 Family history of malignant neoplasm of other organs or systems: Secondary | ICD-10-CM | POA: Diagnosis not present

## 2022-10-19 DIAGNOSIS — R0603 Acute respiratory distress: Secondary | ICD-10-CM | POA: Diagnosis not present

## 2022-10-19 DIAGNOSIS — Z8711 Personal history of peptic ulcer disease: Secondary | ICD-10-CM

## 2022-10-19 DIAGNOSIS — Z90710 Acquired absence of both cervix and uterus: Secondary | ICD-10-CM

## 2022-10-19 DIAGNOSIS — E1151 Type 2 diabetes mellitus with diabetic peripheral angiopathy without gangrene: Secondary | ICD-10-CM | POA: Diagnosis not present

## 2022-10-19 DIAGNOSIS — K921 Melena: Secondary | ICD-10-CM | POA: Diagnosis not present

## 2022-10-19 DIAGNOSIS — D509 Iron deficiency anemia, unspecified: Secondary | ICD-10-CM | POA: Diagnosis not present

## 2022-10-19 LAB — CBC WITH DIFFERENTIAL/PLATELET
Abs Immature Granulocytes: 0.02 10*3/uL (ref 0.00–0.07)
Basophils Absolute: 0 10*3/uL (ref 0.0–0.1)
Basophils Relative: 1 %
Eosinophils Absolute: 0.2 10*3/uL (ref 0.0–0.5)
Eosinophils Relative: 3 %
HCT: 36.6 % (ref 36.0–46.0)
Hemoglobin: 11.7 g/dL — ABNORMAL LOW (ref 12.0–15.0)
Immature Granulocytes: 0 %
Lymphocytes Relative: 28 %
Lymphs Abs: 1.9 10*3/uL (ref 0.7–4.0)
MCH: 29.5 pg (ref 26.0–34.0)
MCHC: 32 g/dL (ref 30.0–36.0)
MCV: 92.4 fL (ref 80.0–100.0)
Monocytes Absolute: 0.5 10*3/uL (ref 0.1–1.0)
Monocytes Relative: 7 %
Neutro Abs: 4.1 10*3/uL (ref 1.7–7.7)
Neutrophils Relative %: 61 %
Platelets: 157 10*3/uL (ref 150–400)
RBC: 3.96 MIL/uL (ref 3.87–5.11)
RDW: 15.6 % — ABNORMAL HIGH (ref 11.5–15.5)
WBC: 6.8 10*3/uL (ref 4.0–10.5)
nRBC: 0 % (ref 0.0–0.2)

## 2022-10-19 LAB — COMPREHENSIVE METABOLIC PANEL
ALT: 9 U/L (ref 0–44)
AST: 18 U/L (ref 15–41)
Albumin: 3.3 g/dL — ABNORMAL LOW (ref 3.5–5.0)
Alkaline Phosphatase: 63 U/L (ref 38–126)
Anion gap: 9 (ref 5–15)
BUN: 20 mg/dL (ref 8–23)
CO2: 22 mmol/L (ref 22–32)
Calcium: 8.3 mg/dL — ABNORMAL LOW (ref 8.9–10.3)
Chloride: 107 mmol/L (ref 98–111)
Creatinine, Ser: 0.98 mg/dL (ref 0.44–1.00)
GFR, Estimated: >60 mL/min (ref >60)
Glucose, Bld: 91 mg/dL (ref 70–99)
Potassium: 3.5 mmol/L (ref 3.5–5.1)
Sodium: 138 mmol/L (ref 135–145)
Total Bilirubin: 0.4 mg/dL (ref 0.3–1.2)
Total Protein: 6.2 g/dL — ABNORMAL LOW (ref 6.5–8.1)

## 2022-10-19 LAB — PROTIME-INR
INR: 1.2 (ref 0.8–1.2)
Prothrombin Time: 15.3 seconds — ABNORMAL HIGH (ref 11.4–15.2)

## 2022-10-19 LAB — ABO/RH: ABO/RH(D): A POS

## 2022-10-19 LAB — TYPE AND SCREEN
ABO/RH(D): A POS
Antibody Screen: NEGATIVE

## 2022-10-19 LAB — HEMOGLOBIN AND HEMATOCRIT, BLOOD
HCT: 33.2 % — ABNORMAL LOW (ref 36.0–46.0)
Hemoglobin: 10.6 g/dL — ABNORMAL LOW (ref 12.0–15.0)

## 2022-10-19 MED ORDER — IOHEXOL 300 MG/ML  SOLN
80.0000 mL | Freq: Once | INTRAMUSCULAR | Status: AC | PRN
  Administered 2022-10-19: 80 mL via INTRAVENOUS

## 2022-10-19 MED ORDER — ONDANSETRON HCL 4 MG/2ML IJ SOLN: 4.0000 mg | Freq: Four times a day (QID) | INTRAMUSCULAR | Status: DC | PRN

## 2022-10-19 MED ORDER — ACETAMINOPHEN 325 MG PO TABS
650.0000 mg | ORAL_TABLET | Freq: Four times a day (QID) | ORAL | Status: DC | PRN
  Administered 2022-10-20: 650 mg via ORAL
  Filled 2022-10-19: qty 2

## 2022-10-19 MED ORDER — SODIUM CHLORIDE (PF) 0.9 % IJ SOLN
INTRAMUSCULAR | Status: AC
  Filled 2022-10-19: qty 50

## 2022-10-19 MED ORDER — PANTOPRAZOLE INFUSION (NEW) - SIMPLE MED
8.0000 mg/h | INTRAVENOUS | Status: DC
  Administered 2022-10-19: 8 mg/h via INTRAVENOUS
  Filled 2022-10-19: qty 80

## 2022-10-19 MED ORDER — ACETAMINOPHEN 650 MG RE SUPP: 650.0000 mg | Freq: Four times a day (QID) | RECTAL | Status: DC | PRN

## 2022-10-19 MED ORDER — PANTOPRAZOLE SODIUM 40 MG IV SOLR: 40.0000 mg | Freq: Two times a day (BID) | INTRAVENOUS | Status: DC

## 2022-10-19 MED ORDER — SODIUM CHLORIDE 0.9 % IV BOLUS
1000.0000 mL | Freq: Once | INTRAVENOUS | Status: AC
  Administered 2022-10-19: 1000 mL via INTRAVENOUS

## 2022-10-19 MED ORDER — PANTOPRAZOLE 80MG IVPB - SIMPLE MED
80.0000 mg | Freq: Once | INTRAVENOUS | Status: AC
  Administered 2022-10-19: 80 mg via INTRAVENOUS
  Filled 2022-10-19: qty 80

## 2022-10-19 NOTE — ED Provider Triage Note (Signed)
Emergency Medicine Provider Triage Evaluation Note  Lindsay Brooks , a 72 y.o. female  was evaluated in triage.  Pt complains of bright red blood per rectum.  This has been present for the past 3 to 4 days.  States she has hemorrhoids but no history of blood to this volume.  Denies abdominal pain.  Does take Plavix.  Has not been eating or drinking like normal due to her symptoms.  Review of Systems  Positive: As above Negative: As above  Physical Exam  BP 137/74 (BP Location: Right Arm)   Pulse (!) 105   Temp 97.6 F (36.4 C) (Oral)   Resp 16   Ht '5\' 1"'$  (1.549 m)   Wt 56 kg   SpO2 99%   BMI 23.33 kg/m  Gen:   Awake, no distress   Resp:  Normal effort  MSK:   Moves extremities without difficulty  Other:  Tremor which she reports has been at baseline, no abdominal TTP  Medical Decision Making  Medically screening exam initiated at 4:24 PM.  Appropriate orders placed.  Lindsay Brooks was informed that the remainder of the evaluation will be completed by another provider, this initial triage assessment does not replace that evaluation, and the importance of remaining in the ED until their evaluation is complete.     Lindsay Brooks, [place] 10/19/22 1625

## 2022-10-19 NOTE — Progress Notes (Signed)
Patient walked  in accompanied by her daughter. Complaint of rectal bleeding for 4 days, voiced seen by PCP and treated for a stool bacteria. Patient voiced blood clots in diaper, diaper removed no clots or active bleeding noted , cluster of large hemorrhoids noted. Daughter showed a picture of a diaper with small bright red clots. Patient not currently under treatment , Dr Darliss Ridgel with no open spots in schedule per charge nurse. Patient and daughter advised to go to [facility] per charge nurse as they would have access to her records and be able to work her up for rectal bleeding. Patient and daughter voiced undrstanding

## 2022-10-19 NOTE — ED Provider Notes (Signed)
[facility] Provider Note  CSN: [id number] Arrival date & time: 10/19/22 1610  Chief Complaint(s) Rectal Bleeding  HPI Lindsay Brooks is a 72 y.o. female with past medical history as below, significant for CAD, CKD, GERD, PVD, pseudoseizure, T2DM, HTN who presents to the ED with complaint of rectal bleeding.  Patient reports 3 to 4 days of left lower quadrant abdominal pain, passing clots in her stool.  She is on Plavix otherwise no thinners.  No vomiting or hematemesis.  She has history of hemorrhoids.  But not having any significant pain with defecation.  Reports that she was treated for infectious diarrhea with antibiotics around 1.5 weeks ago, the diarrhea has since subsided.  Reports compliance with home medications.  Companied by daughter who helps care for the patient.  Denies similar symptoms in the past does report intermittent bleeding associated with prior hemorrhoids.  Denies any vaginal bleeding  Past Medical History Past Medical History:  Diagnosis Date  . CAD (coronary artery disease)    a. s/p 2 CoStar DES to RCA 06/2004. b.  LHC 12/09:  EF 65%, oCFX 40%, oRCA 30%, then 30-40% leading into stented seg that is patent, (10-20% ISR), 40% beyond stent,;  c. EF 81%, poor exercise capacity, hypotensive BP response, + chest pain, normal nuclear images (overall low risk)  . CKD (chronic kidney disease)   . Colon polyps    a. s/p removal 01/2012 - was told one might be cancerous but was not diagnosed with colon cancer - plan is for f/u colonoscopy in 5 yrs.  Marland Kitchen GERD (gastroesophageal reflux disease)   . Hx of echocardiogram    a. Echo 06/2011: EF 55-60%, Gr 1 diast dysfn, mild MR  . Insomnia   . Mini stroke    History of, in 2005 (before she was diagnosed with CAD).  . Osteoporosis   . Other and unspecified hyperlipidemia   . Other isolated or specific phobias   . Peripheral vascular disease, unspecified ([place])    a. s/p L common  femoral & left illiac stenting previously (patent by angio in 2007). - ABI 06/2011 normal. b. Carotid US 0-39% BICA 06/2011 (for f/u 2 yrs);   c.  ABIs 9/13:  normal (1.1 bilaterally)  . Personal history of peptic ulcer disease    Remotely.  Marland Kitchen Psychiatric pseudoseizure    per patient and family  . Tobacco use disorder   . Type II or unspecified type diabetes mellitus without mention of complication, not stated as uncontrolled   . Unspecified essential hypertension    Patient Active Problem List   Diagnosis Date Noted  . Dehydration 07/10/2022  . Neuropathy due to chemotherapeutic drug ([place]) 07/09/2022    Class: Chronic  . Genetic testing 05/19/2022  . Hypokalemia due to loss of potassium 05/18/2022  . Elevated serum creatinine 05/18/2022  . Breast cancer of upper-outer quadrant of left female breast ([place]) 03/18/2022    Class: Diagnosis of  . Dyspnea 05/18/2013  . Solitary pulmonary nodule 05/18/2013  . Chest pain 04/09/2012  . CAD (coronary artery disease)   . TRANSIENT ISCHEMIC ATTACK 08/08/2009  . DIABETES MELLITUS, TYPE II 02/01/2009  . Hyperlipidemia 02/01/2009  . CLAUSTROPHOBIA 02/01/2009  . Smoker 02/01/2009  . HYPERTENSION 02/01/2009  . Coronary atherosclerosis 02/01/2009  . PERIPHERAL VASCULAR DISEASE 02/01/2009  . GASTROESOPHAGEAL REFLUX DISEASE 02/01/2009  . PEPTIC ULCER DISEASE, HX OF 02/01/2009   Home Medication(s) Prior to Admission medications   Medication Sig Start Date  End Date Taking? Authorizing Provider  aspirin EC 81 MG tablet Take 81 mg by mouth daily.    [provider]  atenolol (TENORMIN) 50 MG tablet Take 75 mg by mouth 2 (two) times daily.     [provider]  busPIRone (BUSPAR) 7.5 MG tablet Take 1 tablet by mouth in the morning, at noon, and at bedtime.    [provider]  clopidogrel (PLAVIX) 75 MG tablet Take 75 mg by mouth daily.    [provider]  DULoxetine (CYMBALTA) 60 MG capsule Take 120 mg by mouth at  bedtime.     [provider]  famotidine (PEPCID) 20 MG tablet Take 20 mg by mouth 2 (two) times daily. 04/22/22   [provider]  fenofibrate (TRICOR) 145 MG tablet Take 145 mg by mouth daily.    [provider]  fexofenadine (ALLEGRA) 180 MG tablet Take 180 mg by mouth daily.    [provider]  gabapentin (NEURONTIN) 600 MG tablet Take 600 mg by mouth 3 (three) times daily.    [provider]  hydrALAZINE (APRESOLINE) 50 MG tablet Take 50 mg by mouth 3 (three) times daily.    [provider]  HYDROcodone-acetaminophen (NORCO) 5-325 MG tablet Take 1 tablet by mouth every 4 (four) hours as needed for moderate pain. 08/07/22   Derwood Kaplan, MD  LANTUS SOLOSTAR 100 UNIT/ML Solostar Pen Inject into the skin. 06/21/22   [provider]  LORazepam (ATIVAN) 0.5 MG tablet Take 1 tablet (0.5 mg total) by mouth at bedtime as needed for sleep. May take one tab prior to imaging studies due to anxiety 05/01/22   Marvia Pickles, PA-C  Melatonin 1 MG CAPS Take by mouth. Patient not taking: Reported on 04/30/2022    [provider]  metoprolol tartrate (LOPRESSOR) 25 MG tablet Take 25 mg by mouth 2 (two) times daily.    [provider]  MOUNJARO 5 MG/0.5ML Pen SMARTSIG:0.5 Milliliter(s) SUB-Q Once a Week 08/27/22   [provider]  nicotine (NICODERM CQ - DOSED IN MG/24 HOURS) 21 mg/24hr patch Place 1 patch (21 mg total) onto the skin daily. Patient not taking: Reported on 08/21/2022 05/01/22   Rosanne Sack A, PA-C  nitroGLYCERIN (NITROSTAT) 0.4 MG SL tablet Place 0.4 mg under the tongue every 5 (five) minutes as needed. For chest pain    [provider]  ondansetron (ZOFRAN-ODT) 4 MG disintegrating tablet Take 4 mg by mouth every 6 (six) hours as needed. 03/18/22   [provider]  pantoprazole (PROTONIX) 40 MG tablet Take 40 mg by mouth 2 (two) times daily. 11/30/18   [provider]   potassium chloride (KLOR-CON M) 10 MEQ tablet Take 1 tablet (10 mEq total) by mouth 3 (three) times daily. 09/07/22   Derwood Kaplan, MD  TRESIBA FLEXTOUCH 100 UNIT/ML FlexTouch Pen Inject into the skin. 05/15/22   [provider]  valsartan-hydrochlorothiazide (DIOVAN-HCT) 320-25 MG tablet TAKE 1 TABLET BY ORAL ROUTE ONCE DAILY FOR HIGH BLOOD PRESSURE 12/14/18   [provider]  zolpidem (AMBIEN) 10 MG tablet Take 10 mg by mouth at bedtime as needed for sleep.  Patient not taking: Reported on 04/30/2022    [provider]  Past Surgical History Past Surgical History:  Procedure Laterality Date  . APPENDECTOMY    . BREAST BIOPSY    . CARDIAC CATHETERIZATION  2009   patent RCA stent. mild CAD otherwise.   Marland Kitchen CATARACT EXTRACTION, BILATERAL  2016  . CHOLECYSTECTOMY  2009  . hysterectomy (other)     age 2  . KNEE ARTHROSCOPY Left   . left arm surgery    . LEFT HEART CATHETERIZATION WITH CORONARY ANGIOGRAM N/A 01/16/2014   Procedure: LEFT HEART CATHETERIZATION WITH CORONARY ANGIOGRAM;  Surgeon: Blane Ohara, MD;  Location: [facility] CATH LAB;  Service: Cardiovascular;  Laterality: N/A;  . left leg angiography     stenting of the left common iliac artery  . left rotator cuff repair    . right coronary artery stent     Family History Family History  Problem Relation Age of Onset  . Heart attack Mother   . Allergies Mother   . Heart attack Father   . Allergies Father   . Kidney disease Father   . Hypertension Father   . Melanoma Sister   . Coronary artery disease Brother   . Breast cancer Maternal Aunt   . Breast cancer Maternal Aunt   . Breast cancer Paternal Aunt        d. under 70  . Pancreatic cancer Maternal Grandmother   . Breast cancer Daughter        BRCA1+    Social History Social History   Tobacco Use  .  Smoking status: Every Day    Packs/day: 1.50    Years: 44.00    Total pack years: 66.00    Types: Cigarettes  . Smokeless tobacco: Never  . Tobacco comments:    12/29/18 1.5-2 ppd  Substance Use Topics  . Alcohol use: No    Comment: Recovering alcoholic. Sober for >30 years.  . Drug use: No   Allergies Amoxicillin, Claritin [loratadine], Insulins, Vioxx [rofecoxib], Celecoxib, Liraglutide, and Propranolol  Review of Systems Review of Systems  Constitutional:  Negative for activity change and fever.  HENT:  Negative for facial swelling and trouble swallowing.   Eyes:  Negative for discharge and redness.  Respiratory:  Negative for cough and shortness of breath.   Cardiovascular:  Negative for chest pain and palpitations.  Gastrointestinal:  Positive for abdominal pain and blood in stool. Negative for nausea and vomiting.  Genitourinary:  Negative for dysuria and flank pain.  Musculoskeletal:  Negative for back pain and gait problem.  Skin:  Negative for pallor and rash.  Neurological:  Negative for syncope and headaches.    Physical Exam Vital Signs  I have reviewed the triage vital signs BP 116/68   Pulse (!) 103   Temp 97.6 F (36.4 C)   Resp (!) 21   Ht '5\' 1"'$  (1.549 m)   Wt 56 kg   SpO2 91%   BMI 23.33 kg/m  Physical Exam Vitals and nursing note reviewed. Exam conducted with a chaperone present.  Constitutional:      General: She is not in acute distress.    Appearance: Normal appearance.  HENT:     Head: Normocephalic and atraumatic.     Right Ear: External ear normal.     Left Ear: External ear normal.     Nose: Nose normal.     Mouth/Throat:     Mouth: Mucous membranes are moist.  Eyes:     General: No scleral icterus.       Right eye:  No discharge.        Left eye: No discharge.  Cardiovascular:     Rate and Rhythm: Normal rate and regular rhythm.     Pulses: Normal pulses.     Heart sounds: Normal heart sounds.  Pulmonary:     Effort: Pulmonary  effort is normal. No respiratory distress.     Breath sounds: Normal breath sounds.  Abdominal:     General: Abdomen is flat.     Palpations: Abdomen is soft.     Tenderness: There is abdominal tenderness. There is no guarding or rebound.       Comments: Not peritoneal  Genitourinary:    Comments: External hemorrhoids noted  Musculoskeletal:     Cervical back: No rigidity.     Right lower leg: No edema.     Left lower leg: No edema.  Skin:    General: Skin is warm and dry.     Capillary Refill: Capillary refill takes less than 2 seconds.  Neurological:     Mental Status: She is alert.  Psychiatric:        Mood and Affect: Mood normal.        Behavior: Behavior normal.     ED Results and Treatments Labs (all labs ordered are listed, but only abnormal results are displayed) Labs Reviewed  COMPREHENSIVE METABOLIC PANEL - Abnormal; Notable for the following components:      Result Value   Calcium 8.3 (*)    Total Protein 6.2 (*)    Albumin 3.3 (*)    All other components within normal limits  CBC WITH DIFFERENTIAL/PLATELET - Abnormal; Notable for the following components:   Hemoglobin 11.7 (*)    RDW 15.6 (*)    All other components within normal limits  PROTIME-INR - Abnormal; Notable for the following components:   Prothrombin Time 15.3 (*)    All other components within normal limits  HEMOGLOBIN AND HEMATOCRIT, BLOOD - Abnormal; Notable for the following components:   Hemoglobin 10.6 (*)    HCT 33.2 (*)    All other components within normal limits  HEMOGLOBIN AND HEMATOCRIT, BLOOD  TYPE AND SCREEN  ABO/RH                                                                                                                          Radiology CT ABDOMEN PELVIS W CONTRAST  Result Date: 10/19/2022 CLINICAL DATA:  Left lower quadrant pain with rectal bleeding EXAM: CT ABDOMEN AND PELVIS WITH CONTRAST TECHNIQUE: Multidetector CT imaging of the abdomen and pelvis was performed  using the standard protocol following bolus administration of intravenous contrast. RADIATION DOSE REDUCTION: This exam was performed according to the departmental dose-optimization program which includes automated exposure control, adjustment of the mA and/or kV according to patient size and/or use of iterative reconstruction technique. CONTRAST:  16m OMNIPAQUE IOHEXOL 300 MG/ML  SOLN COMPARISON:  PET CT 12/26/2014, FINDINGS: Lower chest: Lung bases demonstrate no acute airspace disease. Coronary vascular  calcification. Hepatobiliary: Hepatic steatosis. Status post cholecystectomy. No biliary dilatation. Pancreas: Unremarkable. No pancreatic ductal dilatation or surrounding inflammatory changes. Spleen: Normal in size without focal abnormality. Adrenals/Urinary Tract: Adrenal glands are unremarkable. Kidneys are normal, without renal calculi, focal lesion, or hydronephrosis. Bladder is unremarkable. Stomach/Bowel: Stomach is within normal limits. No evidence of bowel wall thickening, distention, or inflammatory changes. Vascular/Lymphatic: Moderate aortic atherosclerosis. No aneurysm. Stent in the left common iliac artery which appears grossly patent. No suspicious lymph nodes. Reproductive: Status post hysterectomy. No adnexal masses. Other: Negative for pelvic effusion or free air Musculoskeletal: No acute or suspicious osseous abnormality IMPRESSION: 1. No CT evidence for acute intra-abdominal or pelvic abnormality. 2. Hepatic steatosis. 3. Aortic atherosclerosis. Aortic Atherosclerosis (ICD10-I70.0). Electronically Signed   By: Donavan Foil M.D.   On: 10/19/2022 19:53    Pertinent labs & imaging results that were available during my care of the patient were reviewed by me and considered in my medical decision making (see MDM for details).  Medications Ordered in ED Medications  sodium chloride (PF) 0.9 % injection (has no administration in time range)  pantoprazole (PROTONIX) 80 mg /NS 100 mL IVPB (80  mg Intravenous New Bag/Given 10/19/22 2229)  pantoprozole (PROTONIX) 80 mg /NS 100 mL infusion (has no administration in time range)  pantoprazole (PROTONIX) injection 40 mg (has no administration in time range)  sodium chloride 0.9 % bolus 1,000 mL (0 mLs Intravenous Stopped 10/19/22 1820)  iohexol (OMNIPAQUE) 300 MG/ML solution 80 mL (80 mLs Intravenous Contrast Given 10/19/22 1906)                                                                                                                                     Procedures Procedures  (including critical care time)  Medical Decision Making / ED Course    Medical Decision Making:    VALERIYA AUTON is a 72 y.o. female  with past medical history as below, significant for CAD, CKD, GERD, PVD, pseudoseizure, T2DM, HTN who presents to the ED with complaint of rectal bleeding. . The complaint involves an extensive differential diagnosis and also carries with it a high risk of complications and morbidity.  Serious etiology was considered. Ddx includes but is not limited to: UGIB, LGIB, AVM, hemorrhoid internal vs external, fissure, infectious, trauma, etc.  Complete initial physical exam performed, notably the patient  was acute distress, resting comfortably, abdomen is not peritoneal, HDS.  Reviewed and confirmed nursing documentation for past medical history, family history, social history.  Vital signs reviewed.    Clinical Course as of 10/19/22 2230  Mon Oct 19, 2022  2026 Patient reassessed, she had bowel movement with bright red blood mixed with her stool and some clots.  HDS. Mild abdominal discomfort left lower quadrant.  Hemoglobin mildly reduced from prior, CT stable.  Will repeat hemoglobin. [SG]  2027 Hemoglobin(!): 11.7 13.7 two weeks ago [SG]  2106 Pt with  ongoing hemoglobin drop, likely ongoing lower GI bleed.  She remains hemodynamically stable.  Did notify on-call GI signed for consult.  Recommend admission at this time for  serial H&H and GI evaluation in the morning.  Pt and family are agreeable. If her HGB continues to drop would recommend transfusion.  [SG]  2226 Admitted to [facility] [SG]  2229 Obvious bright red blood in stool [SG]    Clinical Course User Index [SG] Jeanell Sparrow, DO    Patient with presumed lower GI bleed, unclear source at this time, CT imaging was stable.  She is hemodynamically stable.  She is on Plavix;  Would recommend holding this overnight- given her anemia and ongoing gastrointestinal bleeding.  Protonix was also started.  No hematemesis.  I did order repeat H&H. Recommend admission for serial H&H and GI evaluation in the a.m.  Patient and family are agreeable.  Admitted to [facility].    Additional history obtained: -Additional history obtained from family -External records from outside source obtained and reviewed including: Chart review including previous notes, labs, imaging, consultation notes including primary care documentation, home medications, prior labs and imaging   Lab Tests: -I ordered, reviewed, and interpreted labs.   The pertinent results include:   Labs Reviewed  COMPREHENSIVE METABOLIC PANEL - Abnormal; Notable for the following components:      Result Value   Calcium 8.3 (*)    Total Protein 6.2 (*)    Albumin 3.3 (*)    All other components within normal limits  CBC WITH DIFFERENTIAL/PLATELET - Abnormal; Notable for the following components:   Hemoglobin 11.7 (*)    RDW 15.6 (*)    All other components within normal limits  PROTIME-INR - Abnormal; Notable for the following components:   Prothrombin Time 15.3 (*)    All other components within normal limits  HEMOGLOBIN AND HEMATOCRIT, BLOOD - Abnormal; Notable for the following components:   Hemoglobin 10.6 (*)    HCT 33.2 (*)    All other components within normal limits  HEMOGLOBIN AND HEMATOCRIT, BLOOD  TYPE AND SCREEN  ABO/RH    Notable for anemia  EKG   EKG Interpretation  Date/Time:     Ventricular Rate:    PR Interval:    QRS Duration:   QT Interval:    QTC Calculation:   R Axis:     Text Interpretation:           Imaging Studies ordered: I ordered imaging studies including CT abdomen pelvis I independently visualized the following imaging with scope of interpretation limited to determining acute life threatening conditions related to emergency care: Imaging stable I independently visualized and interpreted imaging. I agree with the radiologist interpretation   Medicines ordered and prescription drug management: Meds ordered this encounter  Medications  . sodium chloride 0.9 % bolus 1,000 mL  . iohexol (OMNIPAQUE) 300 MG/ML solution 80 mL  . sodium chloride (PF) 0.9 % injection    Murray Hodgkins W: cabinet override  . pantoprazole (PROTONIX) 80 mg /NS 100 mL IVPB  . pantoprozole (PROTONIX) 80 mg /NS 100 mL infusion  . pantoprazole (PROTONIX) injection 40 mg    -I have reviewed the patients home medicines and have made adjustments as needed   Consultations Obtained: I requested consultation with the GI Dr Collene Mares; notified via secure chat to see in the AM  Cardiac Monitoring: The patient was maintained on a cardiac monitor.  I personally viewed and interpreted the cardiac monitored which showed an underlying  rhythm of: NSR  Social Determinants of Health:  Diagnosis or treatment significantly limited by social determinants of health: current smoker   Reevaluation: After the interventions noted above, I reevaluated the patient and found that they have stayed the same  Co morbidities that complicate the patient evaluation . Past Medical History:  Diagnosis Date  . CAD (coronary artery disease)    a. s/p 2 CoStar DES to RCA 06/2004. b.  LHC 12/09:  EF 65%, oCFX 40%, oRCA 30%, then 30-40% leading into stented seg that is patent, (10-20% ISR), 40% beyond stent,;  c. EF 81%, poor exercise capacity, hypotensive BP response, + chest pain, normal nuclear  images (overall low risk)  . CKD (chronic kidney disease)   . Colon polyps    a. s/p removal 01/2012 - was told one might be cancerous but was not diagnosed with colon cancer - plan is for f/u colonoscopy in 5 yrs.  Marland Kitchen GERD (gastroesophageal reflux disease)   . Hx of echocardiogram    a. Echo 06/2011: EF 55-60%, Gr 1 diast dysfn, mild MR  . Insomnia   . Mini stroke    History of, in 2005 (before she was diagnosed with CAD).  . Osteoporosis   . Other and unspecified hyperlipidemia   . Other isolated or specific phobias   . Peripheral vascular disease, unspecified ([place])    a. s/p L common femoral & left illiac stenting previously (patent by angio in 2007). - ABI 06/2011 normal. b. Carotid US 0-39% BICA 06/2011 (for f/u 2 yrs);   c.  ABIs 9/13:  normal (1.1 bilaterally)  . Personal history of peptic ulcer disease    Remotely.  Marland Kitchen Psychiatric pseudoseizure    per patient and family  . Tobacco use disorder   . Type II or unspecified type diabetes mellitus without mention of complication, not stated as uncontrolled   . Unspecified essential hypertension       Dispostion: Disposition decision including need for hospitalization was considered, and patient admitted to the hospital.    Final Clinical Impression(s) / ED Diagnoses Final diagnoses:  Gastrointestinal hemorrhage, unspecified gastrointestinal hemorrhage type     This chart was dictated using voice recognition software.  Despite best efforts to proofread,  errors can occur which can change the documentation meaning.    Jeanell Sparrow, DO 10/19/22 2229

## 2022-10-19 NOTE — ED Triage Notes (Signed)
Patient has thick dark red blood clots coming out of her rectum. Has been going on for 3 days. Takes Plavix.

## 2022-10-19 NOTE — H&P (Signed)
History and Physical      Lindsay Brooks [id number] DOB: 1951-04-10 DOA: 10/19/2022  PCP: Marco Collie, MD *** Patient coming from: home ***  I have personally briefly reviewed patient's old medical records in [place]  Chief Complaint: ***  HPI: Lindsay Brooks is a 72 y.o. female with medical history significant for *** who is admitted to [facility] on 10/19/2022 with *** after presenting from home*** to [facility] ED complaining of ***.   ***        ***  ED Course:  Vital signs in the ED were notable for the following: ***  Labs were notable for the following: ***  Per my interpretation, EKG in ED demonstrated the following:  ***  Imaging and additional notable ED work-up: ***  While in the ED, the following were administered: ***  Subsequently, the patient was admitted  ***  ***red   Review of Systems: As per HPI otherwise 10 point review of systems negative.   Past Medical History:  Diagnosis Date   CAD (coronary artery disease)    a. s/p 2 CoStar DES to RCA 06/2004. b.  LHC 12/09:  EF 65%, oCFX 40%, oRCA 30%, then 30-40% leading into stented seg that is patent, (10-20% ISR), 40% beyond stent,;  c. EF 81%, poor exercise capacity, hypotensive BP response, + chest pain, normal nuclear images (overall low risk)   CKD (chronic kidney disease)    Colon polyps    a. s/p removal 01/2012 - was told one might be cancerous but was not diagnosed with colon cancer - plan is for f/u colonoscopy in 5 yrs.   GERD (gastroesophageal reflux disease)    Hx of echocardiogram    a. Echo 06/2011: EF 55-60%, Gr 1 diast dysfn, mild MR   Insomnia    Mini stroke    History of, in 2005 (before she was diagnosed with CAD).   Osteoporosis    Other and unspecified hyperlipidemia    Other isolated or specific phobias    Peripheral vascular disease, unspecified ([place])    a. s/p L common femoral & left illiac stenting previously (patent by angio in 2007). - ABI 06/2011  normal. b. Carotid US 0-39% BICA 06/2011 (for f/u 2 yrs);   c.  ABIs 9/13:  normal (1.1 bilaterally)   Personal history of peptic ulcer disease    Remotely.   Psychiatric pseudoseizure    per patient and family   Tobacco use disorder    Type II or unspecified type diabetes mellitus without mention of complication, not stated as uncontrolled    Unspecified essential hypertension     Past Surgical History:  Procedure Laterality Date   APPENDECTOMY     BREAST BIOPSY     CARDIAC CATHETERIZATION  2009   patent RCA stent. mild CAD otherwise.    CATARACT EXTRACTION, BILATERAL  2016   CHOLECYSTECTOMY  2009   hysterectomy (other)     age 27   KNEE ARTHROSCOPY Left    left arm surgery     LEFT HEART CATHETERIZATION WITH CORONARY ANGIOGRAM N/A 01/16/2014   Procedure: LEFT HEART CATHETERIZATION WITH CORONARY ANGIOGRAM;  Surgeon: Blane Ohara, MD;  Location: [facility] CATH LAB;  Service: Cardiovascular;  Laterality: N/A;   left leg angiography     stenting of the left common iliac artery   left rotator cuff repair     right coronary artery stent      Social History:  reports that she has been  smoking cigarettes. She has a 66.00 pack-year smoking history. She has never used smokeless tobacco. She reports that she does not drink alcohol and does not use drugs.   Allergies  Allergen Reactions   Amoxicillin Hives    vomiting   Claritin [Loratadine] Other (See Comments)    Pt's daughter notified us of this allergy.   Insulins Nausea And Vomiting and Other (See Comments)    Patient unsure of name of insulin, will call us back   Vioxx [Rofecoxib] Hives   Celecoxib Hives and Nausea And Vomiting   Liraglutide Nausea And Vomiting and Other (See Comments)   Propranolol Nausea And Vomiting and Other (See Comments)    diarrhea    Family History  Problem Relation Age of Onset   Heart attack Mother    Allergies Mother    Heart attack Father    Allergies Father    Kidney disease Father     Hypertension Father    Melanoma Sister    Coronary artery disease Brother    Breast cancer Maternal Aunt    Breast cancer Maternal Aunt    Breast cancer Paternal Aunt        d. under 36   Pancreatic cancer Maternal Grandmother    Breast cancer Daughter        BRCA1+    Family history reviewed and not pertinent ***   Prior to Admission medications   Medication Sig Start Date End Date Taking? Authorizing Provider  aspirin EC 81 MG tablet Take 81 mg by mouth daily.    [provider]  atenolol (TENORMIN) 50 MG tablet Take 75 mg by mouth 2 (two) times daily.     [provider]  busPIRone (BUSPAR) 7.5 MG tablet Take 1 tablet by mouth in the morning, at noon, and at bedtime.    [provider]  clopidogrel (PLAVIX) 75 MG tablet Take 75 mg by mouth daily.    [provider]  DULoxetine (CYMBALTA) 60 MG capsule Take 120 mg by mouth at bedtime.     [provider]  famotidine (PEPCID) 20 MG tablet Take 20 mg by mouth 2 (two) times daily. 04/22/22   [provider]  fenofibrate (TRICOR) 145 MG tablet Take 145 mg by mouth daily.    [provider]  fexofenadine (ALLEGRA) 180 MG tablet Take 180 mg by mouth daily.    [provider]  gabapentin (NEURONTIN) 600 MG tablet Take 600 mg by mouth 3 (three) times daily.    [provider]  hydrALAZINE (APRESOLINE) 50 MG tablet Take 50 mg by mouth 3 (three) times daily.    [provider]  HYDROcodone-acetaminophen (NORCO) 5-325 MG tablet Take 1 tablet by mouth every 4 (four) hours as needed for moderate pain. 08/07/22   Derwood Kaplan, MD  LANTUS SOLOSTAR 100 UNIT/ML Solostar Pen Inject into the skin. 06/21/22   [provider]  LORazepam (ATIVAN) 0.5 MG tablet Take 1 tablet (0.5 mg total) by mouth at bedtime as needed for sleep. May take one tab prior to imaging studies due to anxiety 05/01/22   Marvia Pickles, PA-C  Melatonin 1 MG CAPS Take by  mouth. Patient not taking: Reported on 04/30/2022    [provider]  metoprolol tartrate (LOPRESSOR) 25 MG tablet Take 25 mg by mouth 2 (two) times daily.    [provider]  MOUNJARO 5 MG/0.5ML Pen SMARTSIG:0.5 Milliliter(s) SUB-Q Once a Week 08/27/22   [provider]  nicotine (NICODERM  CQ - DOSED IN MG/24 HOURS) 21 mg/24hr patch Place 1 patch (21 mg total) onto the skin daily. Patient not taking: Reported on 08/21/2022 05/01/22   Rosanne Sack A, PA-C  nitroGLYCERIN (NITROSTAT) 0.4 MG SL tablet Place 0.4 mg under the tongue every 5 (five) minutes as needed. For chest pain    [provider]  ondansetron (ZOFRAN-ODT) 4 MG disintegrating tablet Take 4 mg by mouth every 6 (six) hours as needed. 03/18/22   [provider]  pantoprazole (PROTONIX) 40 MG tablet Take 40 mg by mouth 2 (two) times daily. 11/30/18   [provider]  potassium chloride (KLOR-CON M) 10 MEQ tablet Take 1 tablet (10 mEq total) by mouth 3 (three) times daily. 09/07/22   Derwood Kaplan, MD  TRESIBA FLEXTOUCH 100 UNIT/ML FlexTouch Pen Inject into the skin. 05/15/22   [provider]  valsartan-hydrochlorothiazide (DIOVAN-HCT) 320-25 MG tablet TAKE 1 TABLET BY ORAL ROUTE ONCE DAILY FOR HIGH BLOOD PRESSURE 12/14/18   [provider]  zolpidem (AMBIEN) 10 MG tablet Take 10 mg by mouth at bedtime as needed for sleep.  Patient not taking: Reported on 04/30/2022    [provider]     Objective    Physical Exam: Vitals:   10/19/22 1830 10/19/22 1845 10/19/22 2030 10/19/22 2130  BP: 134/65 123/66 121/77 116/68  Pulse: 95 96 (!) 108 (!) 103  Resp: (!) 26 (!) 26 (!) 25 (!) 21  Temp:   97.6 F (36.4 C)   TempSrc:      SpO2: 96% 94% 95% 91%  Weight:      Height:        General: appears to be stated age; alert, oriented Skin: warm, dry, no rash Head:  AT/[place] Mouth:  Oral mucosa membranes appear moist, normal dentition Neck: supple; trachea  midline Heart:  RRR; did not appreciate any M/R/G Lungs: CTAB, did not appreciate any wheezes, rales, or rhonchi Abdomen: + BS; soft, ND, NT Vascular: 2+ pedal pulses b/l; 2+ radial pulses b/l Extremities: no peripheral edema, no muscle wasting Neuro: strength and sensation intact in upper and lower extremities b/l    *** Neuro: 5/5 strength of the proximal and distal flexors and extensors of the upper and lower extremities bilaterally; sensation intact in upper and lower extremities b/l; cranial nerves II through XII grossly intact; no pronator drift; no evidence suggestive of slurred speech, dysarthria, or facial droop; Normal muscle tone. No tremors. *** Neuro: In the setting of the patient's current mental status and associated inability to follow instructions, unable to perform full neurologic exam at this time.  As such, assessment of strength, sensation, and cranial nerves is limited at this time. Patient noted to spontaneously move all 4 extremities. No tremors.  ***    Labs on Admission: I have personally reviewed following labs and imaging studies  CBC: Recent Labs  Lab 10/19/22 1652 10/19/22 2034  WBC 6.8  --   NEUTROABS 4.1  --   HGB 11.7* 10.6*  HCT 36.6 33.2*  MCV 92.4  --   PLT 157  --    Basic Metabolic Panel: Recent Labs  Lab 10/19/22 1652  NA 138  K 3.5  CL 107  CO2 22  GLUCOSE 91  BUN 20  CREATININE 0.98  CALCIUM 8.3*   GFR: Estimated Creatinine Clearance: 39.2 mL/min (by C-G formula based on SCr of 0.98 mg/dL). Liver Function Tests: Recent Labs  Lab 10/19/22 1652  AST 18  ALT 9  ALKPHOS 63  BILITOT 0.4  PROT 6.2*  ALBUMIN 3.3*   No results for input(s): "LIPASE", "AMYLASE" in the last 168 hours. No results for input(s): "AMMONIA" in the last 168 hours. Coagulation Profile: Recent Labs  Lab 10/19/22 1652  INR 1.2   Cardiac Enzymes: No results for input(s): "CKTOTAL", "CKMB", "CKMBINDEX", "TROPONINI" in the last 168 hours. BNP  (last 3 results) No results for input(s): "PROBNP" in the last 8760 hours. HbA1C: No results for input(s): "HGBA1C" in the last 72 hours. CBG: No results for input(s): "GLUCAP" in the last 168 hours. Lipid Profile: No results for input(s): "CHOL", "HDL", "LDLCALC", "TRIG", "CHOLHDL", "LDLDIRECT" in the last 72 hours. Thyroid Function Tests: No results for input(s): "TSH", "T4TOTAL", "FREET4", "T3FREE", "THYROIDAB" in the last 72 hours. Anemia Panel: No results for input(s): "VITAMINB12", "FOLATE", "FERRITIN", "TIBC", "IRON", "RETICCTPCT" in the last 72 hours. Urine analysis:    Component Value Date/Time   COLORURINE STRAW (A) 12/23/2018 2222   APPEARANCEUR CLEAR 12/23/2018 2222   LABSPEC 1.011 12/23/2018 2222   PHURINE 5.0 12/23/2018 2222   GLUCOSEU NEGATIVE 12/23/2018 2222   HGBUR NEGATIVE 12/23/2018 2222   BILIRUBINUR NEGATIVE 12/23/2018 2222   KETONESUR NEGATIVE 12/23/2018 2222   PROTEINUR NEGATIVE 12/23/2018 2222   NITRITE NEGATIVE 12/23/2018 2222   LEUKOCYTESUR NEGATIVE 12/23/2018 2222    Radiological Exams on Admission: CT ABDOMEN PELVIS W CONTRAST  Result Date: 10/19/2022 CLINICAL DATA:  Left lower quadrant pain with rectal bleeding EXAM: CT ABDOMEN AND PELVIS WITH CONTRAST TECHNIQUE: Multidetector CT imaging of the abdomen and pelvis was performed using the standard protocol following bolus administration of intravenous contrast. RADIATION DOSE REDUCTION: This exam was performed according to the departmental dose-optimization program which includes automated exposure control, adjustment of the mA and/or kV according to patient size and/or use of iterative reconstruction technique. CONTRAST:  74m OMNIPAQUE IOHEXOL 300 MG/ML  SOLN COMPARISON:  PET CT 12/26/2014, FINDINGS: Lower chest: Lung bases demonstrate no acute airspace disease. Coronary vascular calcification. Hepatobiliary: Hepatic steatosis. Status post cholecystectomy. No biliary dilatation. Pancreas: Unremarkable. No  pancreatic ductal dilatation or surrounding inflammatory changes. Spleen: Normal in size without focal abnormality. Adrenals/Urinary Tract: Adrenal glands are unremarkable. Kidneys are normal, without renal calculi, focal lesion, or hydronephrosis. Bladder is unremarkable. Stomach/Bowel: Stomach is within normal limits. No evidence of bowel wall thickening, distention, or inflammatory changes. Vascular/Lymphatic: Moderate aortic atherosclerosis. No aneurysm. Stent in the left common iliac artery which appears grossly patent. No suspicious lymph nodes. Reproductive: Status post hysterectomy. No adnexal masses. Other: Negative for pelvic effusion or free air Musculoskeletal: No acute or suspicious osseous abnormality IMPRESSION: 1. No CT evidence for acute intra-abdominal or pelvic abnormality. 2. Hepatic steatosis. 3. Aortic atherosclerosis. Aortic Atherosclerosis (ICD10-I70.0). Electronically Signed   By: KDonavan FoilM.D.   On: 10/19/2022 19:53      Assessment/Plan    Principal Problem:   Acute lower GI bleeding  ***      ***          ***           ***          ***          ***          ***          ***          ***          ***          ***          ***          ***          ***  DVT prophylaxis: SCD's ***  Code Status: Full code*** Family Communication: none*** Disposition Plan: Per Rounding Team Consults called: none***;  Admission status: ***    I SPENT GREATER THAN 75 *** MINUTES IN CLINICAL CARE TIME/MEDICAL DECISION-MAKING IN COMPLETING THIS ADMISSION.     [place] DO [facility] [place]   10/19/2022, 10:36 PM   ***

## 2022-10-20 ENCOUNTER — Ambulatory Visit: Payer: 59 | Admitting: Oncology

## 2022-10-20 ENCOUNTER — Ambulatory Visit: Payer: 59 | Admitting: Hematology and Oncology

## 2022-10-20 ENCOUNTER — Telehealth: Payer: Self-pay

## 2022-10-20 ENCOUNTER — Encounter (HOSPITAL_COMMUNITY): Payer: Self-pay | Admitting: Internal Medicine

## 2022-10-20 ENCOUNTER — Other Ambulatory Visit: Payer: 59

## 2022-10-20 ENCOUNTER — Inpatient Hospital Stay: Payer: 59

## 2022-10-20 ENCOUNTER — Encounter: Payer: 59 | Admitting: Dietician

## 2022-10-20 DIAGNOSIS — I2510 Atherosclerotic heart disease of native coronary artery without angina pectoris: Secondary | ICD-10-CM

## 2022-10-20 DIAGNOSIS — R0603 Acute respiratory distress: Secondary | ICD-10-CM | POA: Diagnosis not present

## 2022-10-20 DIAGNOSIS — R1012 Left upper quadrant pain: Secondary | ICD-10-CM | POA: Diagnosis not present

## 2022-10-20 DIAGNOSIS — U071 COVID-19: Secondary | ICD-10-CM | POA: Diagnosis not present

## 2022-10-20 DIAGNOSIS — D62 Acute posthemorrhagic anemia: Secondary | ICD-10-CM | POA: Diagnosis not present

## 2022-10-20 DIAGNOSIS — E119 Type 2 diabetes mellitus without complications: Secondary | ICD-10-CM | POA: Diagnosis not present

## 2022-10-20 DIAGNOSIS — D509 Iron deficiency anemia, unspecified: Secondary | ICD-10-CM | POA: Diagnosis not present

## 2022-10-20 DIAGNOSIS — K921 Melena: Secondary | ICD-10-CM | POA: Diagnosis not present

## 2022-10-20 DIAGNOSIS — F411 Generalized anxiety disorder: Secondary | ICD-10-CM | POA: Diagnosis present

## 2022-10-20 DIAGNOSIS — J309 Allergic rhinitis, unspecified: Secondary | ICD-10-CM | POA: Insufficient documentation

## 2022-10-20 DIAGNOSIS — K922 Gastrointestinal hemorrhage, unspecified: Secondary | ICD-10-CM | POA: Diagnosis not present

## 2022-10-20 DIAGNOSIS — I2583 Coronary atherosclerosis due to lipid rich plaque: Secondary | ICD-10-CM

## 2022-10-20 LAB — CBC WITH DIFFERENTIAL/PLATELET
Abs Immature Granulocytes: 0.01 10*3/uL (ref 0.00–0.07)
Basophils Absolute: 0 10*3/uL (ref 0.0–0.1)
Basophils Relative: 1 %
Eosinophils Absolute: 0.2 10*3/uL (ref 0.0–0.5)
Eosinophils Relative: 3 %
HCT: 32.1 % — ABNORMAL LOW (ref 36.0–46.0)
Hemoglobin: 10.4 g/dL — ABNORMAL LOW (ref 12.0–15.0)
Immature Granulocytes: 0 %
Lymphocytes Relative: 34 %
Lymphs Abs: 1.9 10*3/uL (ref 0.7–4.0)
MCH: 29.7 pg (ref 26.0–34.0)
MCHC: 32.4 g/dL (ref 30.0–36.0)
MCV: 91.7 fL (ref 80.0–100.0)
Monocytes Absolute: 0.4 10*3/uL (ref 0.1–1.0)
Monocytes Relative: 8 %
Neutro Abs: 2.9 10*3/uL (ref 1.7–7.7)
Neutrophils Relative %: 54 %
Platelets: 137 10*3/uL — ABNORMAL LOW (ref 150–400)
RBC: 3.5 MIL/uL — ABNORMAL LOW (ref 3.87–5.11)
RDW: 15.9 % — ABNORMAL HIGH (ref 11.5–15.5)
WBC: 5.4 10*3/uL (ref 4.0–10.5)
nRBC: 0 % (ref 0.0–0.2)

## 2022-10-20 LAB — GLUCOSE, CAPILLARY
Glucose-Capillary: 112 mg/dL — ABNORMAL HIGH (ref 70–99)
Glucose-Capillary: 298 mg/dL — ABNORMAL HIGH (ref 70–99)
Glucose-Capillary: 78 mg/dL (ref 70–99)

## 2022-10-20 LAB — COMPREHENSIVE METABOLIC PANEL
ALT: 11 U/L (ref 0–44)
AST: 17 U/L (ref 15–41)
Albumin: 3 g/dL — ABNORMAL LOW (ref 3.5–5.0)
Alkaline Phosphatase: 63 U/L (ref 38–126)
Anion gap: 9 (ref 5–15)
BUN: 20 mg/dL (ref 8–23)
CO2: 24 mmol/L (ref 22–32)
Calcium: 8.6 mg/dL — ABNORMAL LOW (ref 8.9–10.3)
Chloride: 107 mmol/L (ref 98–111)
Creatinine, Ser: 0.84 mg/dL (ref 0.44–1.00)
GFR, Estimated: >60 mL/min (ref >60)
Glucose, Bld: 74 mg/dL (ref 70–99)
Potassium: 3.5 mmol/L (ref 3.5–5.1)
Sodium: 140 mmol/L (ref 135–145)
Total Bilirubin: 0.6 mg/dL (ref 0.3–1.2)
Total Protein: 5.6 g/dL — ABNORMAL LOW (ref 6.5–8.1)

## 2022-10-20 LAB — APTT
aPTT: >200 seconds (ref 24–36)
aPTT: 29 seconds (ref 24–36)

## 2022-10-20 LAB — HEMOGLOBIN AND HEMATOCRIT, BLOOD
HCT: 32.5 % — ABNORMAL LOW (ref 36.0–46.0)
HCT: 33.2 % — ABNORMAL LOW (ref 36.0–46.0)
Hemoglobin: 10.4 g/dL — ABNORMAL LOW (ref 12.0–15.0)
Hemoglobin: 10.7 g/dL — ABNORMAL LOW (ref 12.0–15.0)

## 2022-10-20 LAB — IRON AND TIBC
Iron: 99 ug/dL (ref 28–170)
Saturation Ratios: 27 % (ref 10.4–31.8)
TIBC: 361 ug/dL (ref 250–450)
UIBC: 262 ug/dL

## 2022-10-20 LAB — CBG MONITORING, ED: Glucose-Capillary: 78 mg/dL (ref 70–99)

## 2022-10-20 LAB — MAGNESIUM
Magnesium: 1.7 mg/dL (ref 1.7–2.4)
Magnesium: 1.8 mg/dL (ref 1.7–2.4)

## 2022-10-20 LAB — FERRITIN: Ferritin: 11 ng/mL (ref 11–307)

## 2022-10-20 MED ORDER — BUSPIRONE HCL 5 MG PO TABS: 7.5000 mg | ORAL_TABLET | Freq: Three times a day (TID) | ORAL | Status: DC

## 2022-10-20 MED ORDER — SODIUM CHLORIDE 0.9 % IV SOLN
125.0000 mg | Freq: Every day | INTRAVENOUS | Status: AC
  Administered 2022-10-20 – 2022-10-21 (×2): 125 mg via INTRAVENOUS
  Filled 2022-10-20 (×2): qty 10

## 2022-10-20 MED ORDER — LORAZEPAM 2 MG/ML IJ SOLN
0.5000 mg | Freq: Once | INTRAMUSCULAR | Status: AC | PRN
  Administered 2022-10-20: 0.5 mg via INTRAVENOUS
  Filled 2022-10-20: qty 1

## 2022-10-20 MED ORDER — LORAZEPAM 0.5 MG PO TABS
0.5000 mg | ORAL_TABLET | Freq: Every evening | ORAL | Status: DC | PRN
  Administered 2022-10-20 – 2022-10-21 (×2): 0.5 mg via ORAL
  Filled 2022-10-20 (×2): qty 1

## 2022-10-20 MED ORDER — DULOXETINE HCL 60 MG PO CPEP
120.0000 mg | ORAL_CAPSULE | Freq: Every day | ORAL | Status: DC
  Administered 2022-10-20 – 2022-10-21 (×2): 120 mg via ORAL
  Filled 2022-10-20 (×2): qty 2

## 2022-10-20 MED ORDER — FLEET ENEMA 7-19 GM/118ML RE ENEM
2.0000 | ENEMA | Freq: Once | RECTAL | Status: AC
  Administered 2022-10-21: 2 via RECTAL
  Filled 2022-10-20: qty 2

## 2022-10-20 MED ORDER — SODIUM CHLORIDE 0.9 % IV SOLN
INTRAVENOUS | Status: DC
  Administered 2022-10-21: 500 mL via INTRAVENOUS

## 2022-10-20 MED ORDER — NICOTINE 21 MG/24HR TD PT24: 21.0000 mg | MEDICATED_PATCH | Freq: Every day | TRANSDERMAL | Status: DC | PRN

## 2022-10-20 MED ORDER — BUSPIRONE HCL 5 MG PO TABS
7.5000 mg | ORAL_TABLET | Freq: Every day | ORAL | Status: DC
  Administered 2022-10-20 – 2022-10-21 (×2): 7.5 mg via ORAL
  Filled 2022-10-20 (×2): qty 2

## 2022-10-20 MED ORDER — INSULIN ASPART 100 UNIT/ML IJ SOLN
0.0000 [IU] | Freq: Four times a day (QID) | INTRAMUSCULAR | Status: DC
  Administered 2022-10-20: 3 [IU] via SUBCUTANEOUS
  Filled 2022-10-20: qty 0.06

## 2022-10-20 MED ORDER — PANTOPRAZOLE SODIUM 40 MG IV SOLR: 40.0000 mg | Freq: Two times a day (BID) | INTRAVENOUS | Status: DC

## 2022-10-20 NOTE — Assessment & Plan Note (Signed)
Hgb down to 10 from baseline 13.  Stable overnight.  One nonbloody BM overnight only. - Stop frequent Hgb checks - Monitor stools - Check Hgb tomorrow

## 2022-10-20 NOTE — Assessment & Plan Note (Addendum)
-   Hold Plavix - Hold aspirin, fibrate, BB

## 2022-10-20 NOTE — ED Notes (Signed)
Placed on 2LNC due to oxygen level dropping to 89% R, provider made aware

## 2022-10-20 NOTE — Assessment & Plan Note (Signed)
Recommend smoking cessation.

## 2022-10-20 NOTE — Assessment & Plan Note (Signed)
-   Resume Buspar, duloxetine

## 2022-10-20 NOTE — Assessment & Plan Note (Signed)
-   Hold fibrate for now

## 2022-10-20 NOTE — Progress Notes (Signed)
@   0644 am , Pt had seizure like activity lasting for 30-40 secs. Pt was shaking while talking to [place] stating that "I am having pseudoseizures. I am aware of what's happening and I can talk while its happening". NP Olena Heckle made aware. Will continue to monitor pt.

## 2022-10-20 NOTE — Progress Notes (Signed)
  Progress Note   Patient: Lindsay Brooks KGS:[id number] DOB: 24-Jun-1951 DOA: 10/19/2022     0 DOS: the patient was seen and examined on 10/20/2022 at 9:31AM      Brief hospital course: Mrs. Lindsay Brooks is a 72 y.o. F with DM, HTN, CAD s/p PCI remote, still on Plavix who presented with few days BRBPR.  Admitted and GI consulted.  Hgb stable overnight, hemodynamically stable.     Assessment and Plan: * Acute lower GI bleeding Patient presented with crampy lower abdominal pain and several red blood stools. No fever, leukocytosis.  No sick contacts.  No melena or hematemesis.  - Consult GI - Clears only - Stop PPI, doubt UGIB - Hold aspirin, Plavix    Acute blood loss anemia Hgb down to 10 from baseline 13.  Stable overnight.  One nonbloody BM overnight only.  Ferritin low - Stop frequent Hgb checks - Monitor stools - Check Hgb tomorrow - Start IV iron    GAD (generalized anxiety disorder) - Resume Buspar, duloxetine  Coronary atherosclerosis - Hold Plavix - Hold aspirin, fibrate, BB  Essential hypertension BP soft - Hold metoprolol/atenolol - Hold valsartan/HCTZ - Hold hydralazine  Tobacco abuse - Recommend smoking cessation  Hyperlipidemia - Hold fibrate for now  DM2 (diabetes mellitus, type 2) (HCC) Glucoses low - Hold Mounjaro, degludec/glargine - SS corrections ordered          Subjective: Only 1 bowel movement overnight, no blood in, no confusion, vomiting, respiratory symptoms.  GI consulted.     Physical Exam: BP 118/70 (BP Location: Right Arm)   Pulse 97   Temp 98.1 F (36.7 C) (Oral)   Resp 18   Ht 5\' 1"  (1.549 m)   Wt 56.4 kg   SpO2 93%   BMI 23.49 kg/m   Elderly adult female, Lying in bed, edentulous, hard of hearing RRR, no murmurs, no peripheral edema Respiratory rate normal, lungs clear without rales or wheezes Abdomen soft without tenderness palpation or guarding, no ascites or distention Attention normal, seems normal,  oriented to situation, face symmetric, speech fluent    Data Reviewed: Discussed with gastroenterology, Dr. Collene Mares Hemoglobin stable overnight Ferritin level Comprehensive metabolic panel normal  Family Communication: Daughter at the bedside    Disposition: Status is: Observation The patient was admitted with lower GI bleed, she will need GI evaluation and possible colonoscopy, after colonoscopy likely home same day        Author: Edwin Dada, MD 10/20/2022 3:09 PM  For on call review [email or web address].

## 2022-10-20 NOTE — Assessment & Plan Note (Signed)
BP soft - Hold metoprolol/atenolol - Hold valsartan/HCTZ - Hold hydralazine

## 2022-10-20 NOTE — Consult Note (Signed)
Reason for Consult: Hematochezia and LLQ abdominal pain Referring Physician: [facility]  Lindsay Brooks HPI: This is a 72 year old female with multiple medial problems admitted for hematochezia, LLQ pain, and an anemia.  She states that she was in her usual state of health yesterday when she started to experience hematochezia.  An image was the bleeding was obtained and it showed blood clots.  She reports that the bleeding was acute and it was immediately followed by LLQ pain.  CT scan of the abdomen did not show any acute abnormalities.  She had an EGD/colonoscopy at [facility] on 02/26/2020 for diarrheal complaints with negative findings.  The random colon biopsies were negative for any inflammation as well as the upper GI biopsies.  The prep with the colonoscopy was rated as fair.  The current imaging and the admission CT scan did not mention anything about diverticula.  Her baseline HGB is 13-14 g/dL and her value on admission was in the 10 g/dL range.  She takes Plavix for her CAD.  Past Medical History:  Diagnosis Date   CAD (coronary artery disease)    a. s/p 2 CoStar DES to RCA 06/2004. b.  LHC 12/09:  EF 65%, oCFX 40%, oRCA 30%, then 30-40% leading into stented seg that is patent, (10-20% ISR), 40% beyond stent,;  c. EF 81%, poor exercise capacity, hypotensive BP response, + chest pain, normal nuclear images (overall low risk)   CKD (chronic kidney disease)    Colon polyps    a. s/p removal 01/2012 - was told one might be cancerous but was not diagnosed with colon cancer - plan is for f/u colonoscopy in 5 yrs.   GERD (gastroesophageal reflux disease)    Hx of echocardiogram    a. Echo 06/2011: EF 55-60%, Gr 1 diast dysfn, mild MR   Insomnia    Mini stroke    History of, in 2005 (before she was diagnosed with CAD).   Osteoporosis    Other and unspecified hyperlipidemia    Other isolated or specific phobias    Peripheral vascular disease, unspecified ([place])    a. s/p L common  femoral & left illiac stenting previously (patent by angio in 2007). - ABI 06/2011 normal. b. Carotid US 0-39% BICA 06/2011 (for f/u 2 yrs);   c.  ABIs 9/13:  normal (1.1 bilaterally)   Personal history of peptic ulcer disease    Remotely.   Psychiatric pseudoseizure    per patient and family   Tobacco use disorder    Type II or unspecified type diabetes mellitus without mention of complication, not stated as uncontrolled    Unspecified essential hypertension     Past Surgical History:  Procedure Laterality Date   APPENDECTOMY     BREAST BIOPSY     CARDIAC CATHETERIZATION  2009   patent RCA stent. mild CAD otherwise.    CATARACT EXTRACTION, BILATERAL  2016   CHOLECYSTECTOMY  2009   hysterectomy (other)     age 82   KNEE ARTHROSCOPY Left    left arm surgery     LEFT HEART CATHETERIZATION WITH CORONARY ANGIOGRAM N/A 01/16/2014   Procedure: LEFT HEART CATHETERIZATION WITH CORONARY ANGIOGRAM;  Surgeon: Blane Ohara, MD;  Location: [facility] CATH LAB;  Service: Cardiovascular;  Laterality: N/A;   left leg angiography     stenting of the left common iliac artery   left rotator cuff repair     right coronary artery stent      Family  History  Problem Relation Age of Onset   Heart attack Mother    Allergies Mother    Heart attack Father    Allergies Father    Kidney disease Father    Hypertension Father    Melanoma Sister    Coronary artery disease Brother    Breast cancer Maternal Aunt    Breast cancer Maternal Aunt    Breast cancer Paternal Aunt        d. under 18   Pancreatic cancer Maternal Grandmother    Breast cancer Daughter        BRCA1+    Social History:  reports that she has been smoking cigarettes. She has a 66.00 pack-year smoking history. She has never used smokeless tobacco. She reports that she does not drink alcohol and does not use drugs.  Allergies:  Allergies  Allergen Reactions   Amoxicillin Hives and Nausea And Vomiting   Claritin [Loratadine] Other  (See Comments)    Pt's daughter notified us of this allergy.   Insulins Nausea And Vomiting and Other (See Comments)    Patient unsure of name of insulin, will call us back   Vioxx [Rofecoxib] Hives   Celecoxib Hives and Nausea And Vomiting   Liraglutide Nausea And Vomiting   Propranolol Diarrhea and Nausea And Vomiting    Medications: Scheduled:  busPIRone  7.5 mg Oral QHS   DULoxetine  120 mg Oral QHS   insulin aspart  0-6 Units Subcutaneous Q6H   [START ON 10/21/2022] sodium phosphate  2 enema Rectal Once   Continuous:  sodium chloride     ferric gluconate (FERRLECIT) IVPB 125 mg (10/20/22 0944)    Results for orders placed or performed during the hospital encounter of 10/19/22 (from the past 24 hour(s))  Hemoglobin and hematocrit, blood     Status: Abnormal   Collection Time: 10/19/22  8:34 PM  Result Value Ref Range   Hemoglobin 10.6 (L) 12.0 - 15.0 g/dL   HCT 33.2 (L) 36.0 - 46.0 %  Hemoglobin and hematocrit, blood     Status: Abnormal   Collection Time: 10/19/22 11:30 PM  Result Value Ref Range   Hemoglobin 10.7 (L) 12.0 - 15.0 g/dL   HCT 33.2 (L) 36.0 - 46.0 %  Magnesium     Status: None   Collection Time: 10/19/22 11:30 PM  Result Value Ref Range   Magnesium 1.8 1.7 - 2.4 mg/dL  APTT     Status: Abnormal   Collection Time: 10/19/22 11:30 PM  Result Value Ref Range   aPTT >200 (HH) 24 - 36 seconds  Hemoglobin and hematocrit, blood     Status: Abnormal   Collection Time: 10/20/22  2:00 AM  Result Value Ref Range   Hemoglobin 10.4 (L) 12.0 - 15.0 g/dL   HCT 32.5 (L) 36.0 - 46.0 %  CBC with Differential/Platelet     Status: Abnormal   Collection Time: 10/20/22  5:08 AM  Result Value Ref Range   WBC 5.4 4.0 - 10.5 K/uL   RBC 3.50 (L) 3.87 - 5.11 MIL/uL   Hemoglobin 10.4 (L) 12.0 - 15.0 g/dL   HCT 32.1 (L) 36.0 - 46.0 %   MCV 91.7 80.0 - 100.0 fL   MCH 29.7 26.0 - 34.0 pg   MCHC 32.4 30.0 - 36.0 g/dL   RDW 15.9 (H) 11.5 - 15.5 %   Platelets 137 (L) 150 - 400  K/uL   nRBC 0.0 0.0 - 0.2 %   Neutrophils Relative % 54 %  Neutro Abs 2.9 1.7 - 7.7 K/uL   Lymphocytes Relative 34 %   Lymphs Abs 1.9 0.7 - 4.0 K/uL   Monocytes Relative 8 %   Monocytes Absolute 0.4 0.1 - 1.0 K/uL   Eosinophils Relative 3 %   Eosinophils Absolute 0.2 0.0 - 0.5 K/uL   Basophils Relative 1 %   Basophils Absolute 0.0 0.0 - 0.1 K/uL   Immature Granulocytes 0 %   Abs Immature Granulocytes 0.01 0.00 - 0.07 K/uL  Comprehensive metabolic panel     Status: Abnormal   Collection Time: 10/20/22  5:08 AM  Result Value Ref Range   Sodium 140 135 - 145 mmol/L   Potassium 3.5 3.5 - 5.1 mmol/L   Chloride 107 98 - 111 mmol/L   CO2 24 22 - 32 mmol/L   Glucose, Bld 74 70 - 99 mg/dL   BUN 20 8 - 23 mg/dL   Creatinine, Ser 0.84 0.44 - 1.00 mg/dL   Calcium 8.6 (L) 8.9 - 10.3 mg/dL   Total Protein 5.6 (L) 6.5 - 8.1 g/dL   Albumin 3.0 (L) 3.5 - 5.0 g/dL   AST 17 15 - 41 U/L   ALT 11 0 - 44 U/L   Alkaline Phosphatase 63 38 - 126 U/L   Total Bilirubin 0.6 0.3 - 1.2 mg/dL   GFR, Estimated >60 >60 mL/min   Anion gap 9 5 - 15  Magnesium     Status: None   Collection Time: 10/20/22  5:08 AM  Result Value Ref Range   Magnesium 1.7 1.7 - 2.4 mg/dL  Ferritin     Status: None   Collection Time: 10/20/22  5:20 AM  Result Value Ref Range   Ferritin 11 11 - 307 ng/mL  Iron and TIBC     Status: None   Collection Time: 10/20/22  5:20 AM  Result Value Ref Range   Iron 99 28 - 170 ug/dL   TIBC 361 250 - 450 ug/dL   Saturation Ratios 27 10.4 - 31.8 %   UIBC 262 ug/dL  CBG monitoring, ED     Status: None   Collection Time: 10/20/22  6:07 AM  Result Value Ref Range   Glucose-Capillary 78 70 - 99 mg/dL  APTT     Status: None   Collection Time: 10/20/22  9:00 AM  Result Value Ref Range   aPTT 29 24 - 36 seconds  Glucose, capillary     Status: None   Collection Time: 10/20/22 12:02 PM  Result Value Ref Range   Glucose-Capillary 78 70 - 99 mg/dL  Glucose, capillary     Status: Abnormal    Collection Time: 10/20/22  5:41 PM  Result Value Ref Range   Glucose-Capillary 298 (H) 70 - 99 mg/dL     CT ABDOMEN PELVIS W CONTRAST  Result Date: 10/19/2022 CLINICAL DATA:  Left lower quadrant pain with rectal bleeding EXAM: CT ABDOMEN AND PELVIS WITH CONTRAST TECHNIQUE: Multidetector CT imaging of the abdomen and pelvis was performed using the standard protocol following bolus administration of intravenous contrast. RADIATION DOSE REDUCTION: This exam was performed according to the departmental dose-optimization program which includes automated exposure control, adjustment of the mA and/or kV according to patient size and/or use of iterative reconstruction technique. CONTRAST:  50m OMNIPAQUE IOHEXOL 300 MG/ML  SOLN COMPARISON:  PET CT 12/26/2014, FINDINGS: Lower chest: Lung bases demonstrate no acute airspace disease. Coronary vascular calcification. Hepatobiliary: Hepatic steatosis. Status post cholecystectomy. No biliary dilatation. Pancreas: Unremarkable. No pancreatic ductal dilatation  or surrounding inflammatory changes. Spleen: Normal in size without focal abnormality. Adrenals/Urinary Tract: Adrenal glands are unremarkable. Kidneys are normal, without renal calculi, focal lesion, or hydronephrosis. Bladder is unremarkable. Stomach/Bowel: Stomach is within normal limits. No evidence of bowel wall thickening, distention, or inflammatory changes. Vascular/Lymphatic: Moderate aortic atherosclerosis. No aneurysm. Stent in the left common iliac artery which appears grossly patent. No suspicious lymph nodes. Reproductive: Status post hysterectomy. No adnexal masses. Other: Negative for pelvic effusion or free air Musculoskeletal: No acute or suspicious osseous abnormality IMPRESSION: 1. No CT evidence for acute intra-abdominal or pelvic abnormality. 2. Hepatic steatosis. 3. Aortic atherosclerosis. Aortic Atherosclerosis (ICD10-I70.0). Electronically Signed   By: Donavan Foil M.D.   On: 10/19/2022  19:53    ROS:  As stated above in the HPI otherwise negative.  Blood pressure 118/70, pulse 97, temperature 98.1 F (36.7 C), temperature source Oral, resp. rate 18, height '5\' 1"'$  (1.549 m), weight 56.4 kg, SpO2 93 %.    PE: Gen: NAD, Alert and Oriented HEENT:  [place]/AT, EOMI Neck: Supple, no LAD Lungs: CTA Bilaterally CV: RRR without M/G/R ABD: Soft, LLQ tenderness, +BS Ext: No C/C/E  Assessment/Plan: 1) Hematochezia. 2) LLQ pain. 3) Anemia.   The patient's clinical presentation can be consistent with an ischemic colitis.  A diverticular source can also be a possibility.  There was pain with palpation of the LLQ and in this instance the pain was after the bleeding.  Typically with ischemic colitis it precedes the hematochezia.  With the relatively recent colonoscopy a flexible sigmoidoscopy will suffice for now.  Plan: 1) FFS tomorrow with Dr. Collene Mares. 2) Enema prep. 3) Agree with holding Plavix.  Lindsay Brooks D 10/20/2022, 5:45 PM

## 2022-10-20 NOTE — Assessment & Plan Note (Addendum)
Patient presented with crampy lower abdominal pain and several red blood stools. No fever, leukocytosis.  No sick contacts.  No melena or hematemesis.  - Consult GI - Clears only - Stop PPI, doubt UGIB - Hold aspirin, Plavix

## 2022-10-20 NOTE — Assessment & Plan Note (Signed)
Glucoses low - Hold Mounjaro, degludec/glargine - SS corrections ordered

## 2022-10-20 NOTE — Telephone Encounter (Signed)
Spoke with patients daughter she has been admitted to [facility] for work up of rectal bleeding.

## 2022-10-20 NOTE — Hospital Course (Signed)
Mrs. Javed is a 72 y.o. F with DM, HTN, CAD s/p PCI remote, still on Plavix who presented with few days BRBPR.  Admitted and GI consulted.  Hgb stable overnight, hemodynamically stable.

## 2022-10-21 ENCOUNTER — Encounter: Payer: Self-pay | Admitting: Oncology

## 2022-10-21 ENCOUNTER — Encounter (HOSPITAL_COMMUNITY)
Admission: EM | Disposition: A | Discharge status: Home / Self Care | Payer: Self-pay | Source: Home / Self Care | Attending: Internal Medicine

## 2022-10-21 ENCOUNTER — Observation Stay (HOSPITAL_COMMUNITY): Payer: 59 | Admitting: Anesthesiology

## 2022-10-21 ENCOUNTER — Encounter (HOSPITAL_COMMUNITY): Payer: Self-pay | Admitting: Internal Medicine

## 2022-10-21 DIAGNOSIS — Z880 Allergy status to penicillin: Secondary | ICD-10-CM | POA: Diagnosis not present

## 2022-10-21 DIAGNOSIS — K921 Melena: Secondary | ICD-10-CM | POA: Diagnosis not present

## 2022-10-21 DIAGNOSIS — D509 Iron deficiency anemia, unspecified: Secondary | ICD-10-CM | POA: Diagnosis not present

## 2022-10-21 DIAGNOSIS — F17200 Nicotine dependence, unspecified, uncomplicated: Secondary | ICD-10-CM | POA: Diagnosis not present

## 2022-10-21 DIAGNOSIS — K922 Gastrointestinal hemorrhage, unspecified: Secondary | ICD-10-CM | POA: Diagnosis not present

## 2022-10-21 DIAGNOSIS — I2583 Coronary atherosclerosis due to lipid rich plaque: Secondary | ICD-10-CM | POA: Diagnosis not present

## 2022-10-21 DIAGNOSIS — E782 Mixed hyperlipidemia: Secondary | ICD-10-CM | POA: Diagnosis not present

## 2022-10-21 DIAGNOSIS — I10 Essential (primary) hypertension: Secondary | ICD-10-CM | POA: Diagnosis not present

## 2022-10-21 DIAGNOSIS — Z886 Allergy status to analgesic agent status: Secondary | ICD-10-CM | POA: Diagnosis not present

## 2022-10-21 DIAGNOSIS — Z803 Family history of malignant neoplasm of breast: Secondary | ICD-10-CM | POA: Diagnosis not present

## 2022-10-21 DIAGNOSIS — F17210 Nicotine dependence, cigarettes, uncomplicated: Secondary | ICD-10-CM | POA: Diagnosis not present

## 2022-10-21 DIAGNOSIS — R1012 Left upper quadrant pain: Secondary | ICD-10-CM | POA: Diagnosis not present

## 2022-10-21 DIAGNOSIS — F411 Generalized anxiety disorder: Secondary | ICD-10-CM | POA: Diagnosis present

## 2022-10-21 DIAGNOSIS — K644 Residual hemorrhoidal skin tags: Secondary | ICD-10-CM | POA: Diagnosis present

## 2022-10-21 DIAGNOSIS — K219 Gastro-esophageal reflux disease without esophagitis: Secondary | ICD-10-CM | POA: Diagnosis present

## 2022-10-21 DIAGNOSIS — D62 Acute posthemorrhagic anemia: Secondary | ICD-10-CM | POA: Diagnosis not present

## 2022-10-21 DIAGNOSIS — E876 Hypokalemia: Secondary | ICD-10-CM | POA: Diagnosis present

## 2022-10-21 DIAGNOSIS — Z720 Tobacco use: Secondary | ICD-10-CM | POA: Diagnosis not present

## 2022-10-21 DIAGNOSIS — Z794 Long term (current) use of insulin: Secondary | ICD-10-CM | POA: Diagnosis not present

## 2022-10-21 DIAGNOSIS — Z8249 Family history of ischemic heart disease and other diseases of the circulatory system: Secondary | ICD-10-CM | POA: Diagnosis not present

## 2022-10-21 DIAGNOSIS — Z808 Family history of malignant neoplasm of other organs or systems: Secondary | ICD-10-CM | POA: Diagnosis not present

## 2022-10-21 DIAGNOSIS — Z955 Presence of coronary angioplasty implant and graft: Secondary | ICD-10-CM | POA: Diagnosis not present

## 2022-10-21 DIAGNOSIS — Z90710 Acquired absence of both cervix and uterus: Secondary | ICD-10-CM | POA: Diagnosis not present

## 2022-10-21 DIAGNOSIS — F1021 Alcohol dependence, in remission: Secondary | ICD-10-CM | POA: Diagnosis present

## 2022-10-21 DIAGNOSIS — I2510 Atherosclerotic heart disease of native coronary artery without angina pectoris: Secondary | ICD-10-CM | POA: Diagnosis not present

## 2022-10-21 DIAGNOSIS — Z841 Family history of disorders of kidney and ureter: Secondary | ICD-10-CM | POA: Diagnosis not present

## 2022-10-21 DIAGNOSIS — Z9049 Acquired absence of other specified parts of digestive tract: Secondary | ICD-10-CM | POA: Diagnosis not present

## 2022-10-21 DIAGNOSIS — L89312 Pressure ulcer of right buttock, stage 2: Secondary | ICD-10-CM | POA: Diagnosis present

## 2022-10-21 DIAGNOSIS — Z7902 Long term (current) use of antithrombotics/antiplatelets: Secondary | ICD-10-CM | POA: Diagnosis not present

## 2022-10-21 DIAGNOSIS — Z7982 Long term (current) use of aspirin: Secondary | ICD-10-CM | POA: Diagnosis not present

## 2022-10-21 DIAGNOSIS — E785 Hyperlipidemia, unspecified: Secondary | ICD-10-CM | POA: Diagnosis present

## 2022-10-21 DIAGNOSIS — Z800 Family history of malignant neoplasm of digestive organs: Secondary | ICD-10-CM | POA: Diagnosis not present

## 2022-10-21 DIAGNOSIS — E119 Type 2 diabetes mellitus without complications: Secondary | ICD-10-CM | POA: Diagnosis not present

## 2022-10-21 DIAGNOSIS — E1151 Type 2 diabetes mellitus with diabetic peripheral angiopathy without gangrene: Secondary | ICD-10-CM | POA: Diagnosis not present

## 2022-10-21 HISTORY — PX: FLEXIBLE SIGMOIDOSCOPY: SHX5431

## 2022-10-21 LAB — COMPREHENSIVE METABOLIC PANEL
ALT: 11 U/L (ref 0–44)
AST: 19 U/L (ref 15–41)
Albumin: 3 g/dL — ABNORMAL LOW (ref 3.5–5.0)
Alkaline Phosphatase: 62 U/L (ref 38–126)
Anion gap: 10 (ref 5–15)
BUN: 16 mg/dL (ref 8–23)
CO2: 24 mmol/L (ref 22–32)
Calcium: 8.4 mg/dL — ABNORMAL LOW (ref 8.9–10.3)
Chloride: 105 mmol/L (ref 98–111)
Creatinine, Ser: 0.81 mg/dL (ref 0.44–1.00)
GFR, Estimated: >60 mL/min (ref >60)
Glucose, Bld: 87 mg/dL (ref 70–99)
Potassium: 3.1 mmol/L — ABNORMAL LOW (ref 3.5–5.1)
Sodium: 139 mmol/L (ref 135–145)
Total Bilirubin: 0.5 mg/dL (ref 0.3–1.2)
Total Protein: 5.5 g/dL — ABNORMAL LOW (ref 6.5–8.1)

## 2022-10-21 LAB — CBC WITH DIFFERENTIAL/PLATELET
Abs Immature Granulocytes: 0.01 10*3/uL (ref 0.00–0.07)
Basophils Absolute: 0 10*3/uL (ref 0.0–0.1)
Basophils Relative: 1 %
Eosinophils Absolute: 0.2 10*3/uL (ref 0.0–0.5)
Eosinophils Relative: 5 %
HCT: 31 % — ABNORMAL LOW (ref 36.0–46.0)
Hemoglobin: 9.9 g/dL — ABNORMAL LOW (ref 12.0–15.0)
Immature Granulocytes: 0 %
Lymphocytes Relative: 26 %
Lymphs Abs: 1.2 10*3/uL (ref 0.7–4.0)
MCH: 29.5 pg (ref 26.0–34.0)
MCHC: 31.9 g/dL (ref 30.0–36.0)
MCV: 92.3 fL (ref 80.0–100.0)
Monocytes Absolute: 0.4 10*3/uL (ref 0.1–1.0)
Monocytes Relative: 8 %
Neutro Abs: 2.8 10*3/uL (ref 1.7–7.7)
Neutrophils Relative %: 60 %
Platelets: 127 10*3/uL — ABNORMAL LOW (ref 150–400)
RBC: 3.36 MIL/uL — ABNORMAL LOW (ref 3.87–5.11)
RDW: 15.7 % — ABNORMAL HIGH (ref 11.5–15.5)
WBC: 4.6 10*3/uL (ref 4.0–10.5)
nRBC: 0 % (ref 0.0–0.2)

## 2022-10-21 LAB — GLUCOSE, CAPILLARY
Glucose-Capillary: 91 mg/dL (ref 70–99)
Glucose-Capillary: 98 mg/dL (ref 70–99)
Glucose-Capillary: 105 mg/dL — ABNORMAL HIGH (ref 70–99)
Glucose-Capillary: 140 mg/dL — ABNORMAL HIGH (ref 70–99)
Glucose-Capillary: 91 mg/dL (ref 70–99)

## 2022-10-21 LAB — HEMOGLOBIN A1C
Hgb A1c MFr Bld: 6.4 % — ABNORMAL HIGH (ref 4.8–5.6)
Mean Plasma Glucose: 137 mg/dL

## 2022-10-21 LAB — MAGNESIUM: Magnesium: 1.8 mg/dL (ref 1.7–2.4)

## 2022-10-21 SURGERY — SIGMOIDOSCOPY, FLEXIBLE
Anesthesia: General

## 2022-10-21 MED ORDER — POTASSIUM CHLORIDE CRYS ER 10 MEQ PO TBCR
40.0000 meq | EXTENDED_RELEASE_TABLET | ORAL | Status: AC
  Administered 2022-10-21 (×2): 40 meq via ORAL
  Filled 2022-10-21 (×2): qty 4

## 2022-10-21 MED ORDER — PROPOFOL 10 MG/ML IV BOLUS
INTRAVENOUS | Status: AC
  Filled 2022-10-21: qty 20

## 2022-10-21 MED ORDER — FENTANYL CITRATE (PF) 100 MCG/2ML IJ SOLN
INTRAMUSCULAR | Status: AC
  Filled 2022-10-21: qty 2

## 2022-10-21 MED ORDER — INSULIN ASPART 100 UNIT/ML IJ SOLN: 0.0000 [IU] | INTRAMUSCULAR | Status: DC

## 2022-10-21 MED ORDER — SODIUM CHLORIDE 0.9 % IV SOLN: INTRAVENOUS | Status: DC | PRN

## 2022-10-21 MED ORDER — MIDAZOLAM HCL (PF) 5 MG/ML IJ SOLN
INTRAMUSCULAR | Status: AC
  Filled 2022-10-21: qty 2

## 2022-10-21 MED ORDER — PROPOFOL 10 MG/ML IV BOLUS
INTRAVENOUS | Status: DC | PRN
  Administered 2022-10-21 (×2): 10 mg via INTRAVENOUS
  Administered 2022-10-21: 20 mg via INTRAVENOUS
  Administered 2022-10-21: 30 mg via INTRAVENOUS

## 2022-10-21 MED ORDER — CHLORHEXIDINE GLUCONATE CLOTH 2 % EX PADS
6.0000 | MEDICATED_PAD | Freq: Every day | CUTANEOUS | Status: DC
  Administered 2022-10-21 – 2022-10-22 (×2): 6 via TOPICAL

## 2022-10-21 MED ORDER — MAGNESIUM SULFATE 2 GM/50ML IV SOLN
2.0000 g | Freq: Once | INTRAVENOUS | Status: AC
  Administered 2022-10-21: 2 g via INTRAVENOUS
  Filled 2022-10-21: qty 50

## 2022-10-21 MED ORDER — INSULIN ASPART 100 UNIT/ML IJ SOLN: 0.0000 [IU] | Freq: Three times a day (TID) | INTRAMUSCULAR | Status: DC

## 2022-10-21 NOTE — Transfer of Care (Signed)
Immediate Anesthesia Transfer of Care Note  Patient: Lindsay Brooks  Procedure(s) Performed: FLEXIBLE SIGMOIDOSCOPY  Patient Location: PACU  Anesthesia Type:MAC  Level of Consciousness: awake, alert , and oriented  Airway & Oxygen Therapy: Patient Spontanous Breathing and Patient connected to face mask oxygen  Post-op Assessment: Report given to RN, Post -op Vital signs reviewed and stable, and Patient moving all extremities X 4  Post vital signs: Reviewed and stable  Last Vitals:  Vitals Value Taken Time  BP 167/62   Temp    Pulse 100 10/21/22 1512  Resp 30 10/21/22 1512  SpO2 100 % 10/21/22 1512  Vitals shown include unvalidated device data.  Last Pain:  Vitals:   10/21/22 1213  TempSrc: Temporal  PainSc: 0-No pain      Patients Stated Pain Goal: 3 ([id number])  Complications: No notable events documented.

## 2022-10-21 NOTE — Progress Notes (Signed)
PROGRESS NOTE    Lindsay Brooks  [id number] DOB: [birth year] DOA: 10/19/2022 PCP: Marco Collie, MD    Chief Complaint  Patient presents with   Rectal Bleeding    Brief Narrative:  Patient 72 year old female history of diabetes hypertension CAD status post PCI remotely, on Plavix presenting with bright red blood per rectum.  GI consulted.  Patient for flexible sigmoidoscopy today 10/21/2022.   Assessment & Plan:   Principal Problem:   Acute lower GI bleeding Active Problems:   Acute blood loss anemia   DM2 (diabetes mellitus, type 2) (HCC)   Hyperlipidemia   Tobacco abuse   Essential hypertension   Coronary atherosclerosis   GAD (generalized anxiety disorder)  #1 acute lower GI bleed/bright red blood per rectum -Patient presented with crampy left lower quadrant abdominal pain, hematochezia.  Denies any fever or leukocytosis.  No sick contacts.  No hematemesis. -Patient initially placed on a PPI however felt not likely upper GI bleed and PPI discontinued. -Aspirin, Plavix held. -Patient with hemoglobin stable at 9.9 this morning. -Patient seen in consultation by GI patient for flexible sigmoidoscopy today. -Follow H&H. -Transfuse threshold hemoglobin < 7.  2.  Acute blood loss anemia -Secondary to problem #1. -Follow H&H. -Transfusion threshold hemoglobin < 7.  3.  GAD -Continue BuSpar, Cymbalta. -Ativan as needed  4.  Hypokalemia -K-Dur 40 mEq p.o. every 4 hours x 2 doses.  5.  CAD -Plavix and aspirin, fenofibrate, beta-blocker on hold. -GI to advise when Plavix and aspirin may be resumed.  6.  Hypertension -BP noted to be soft. -Continue to hold antihypertensive medications.  7.  Tobacco abuse -Tobacco cessation.  8.  Hyperlipidemia -Continue to hold fibrate.  9.  Diabetes mellitus type 2 -Hemoglobin A1c 6.4 (10/20/2022) -CBG noted at 98 this morning. -Continue to hold Lantus. -SSI. -   DVT prophylaxis: SCDs Code Status: Full Family  Communication: Updated patient.  No family at bedside. Disposition: Home when clinically improved and cleared by gastroenterology.  Status is: Inpatient Remains inpatient appropriate because: Severity of illness   Consultants:  Gastroenterology: Dr. Benson Norway 10/20/2022  Procedures:  CT abdomen and pelvis 10/19/2022   Antimicrobials:  Anti-infectives (From admission, onward)    None         Subjective: Patient in the endoscopy suite awaiting flex sigmoidoscopy.  Denies any chest pain.  No shortness of breath.  Still with left lower quadrant abdominal pain.  Denied any further bloody bowel movements this morning.  Objective: Vitals:   10/21/22 1525 10/21/22 1527 10/21/22 1539 10/21/22 1606  BP:  (!) 141/68 (!) 155/75 120/79  Pulse: 98 99 96 92  Resp: (!) '26 19  18  '$ Temp: (!) 96.4 F (35.8 C) 97.8 F (36.6 C)  (!) 97.5 F (36.4 C)  TempSrc: Temporal Oral  Oral  SpO2: 96% 97% 98% 96%  Weight:      Height:        Intake/Output Summary (Last 24 hours) at 10/21/2022 1925 Last data filed at 10/21/2022 1850 Gross per 24 hour  Intake 1089.02 ml  Output --  Net 1089.02 ml   Filed Weights   10/19/22 1618 10/20/22 0500 10/21/22 0500  Weight: 56 kg 56.4 kg 56.2 kg    Examination:  General exam: Appears calm and comfortable  Respiratory system: Clear to auscultation. Respiratory effort normal. Cardiovascular system: S1 & S2 heard, RRR. No JVD, murmurs, rubs, gallops or clicks. No pedal edema. Gastrointestinal system: Abdomen is nondistended, soft and tender to palpation in the left  lower quadrant.  Positive bowel sounds.  No rebound.  No guarding. Central nervous system: Alert and oriented. No focal neurological deficits. Extremities: Symmetric 5 x 5 power. Skin: No rashes, lesions or ulcers Psychiatry: Judgement and insight appear normal. Mood & affect appropriate.     Data Reviewed: I have personally reviewed following labs and imaging studies  CBC: Recent Labs  Lab  10/19/22 1652 10/19/22 2034 10/19/22 2330 10/20/22 0200 10/20/22 0508 10/21/22 1042  WBC 6.8  --   --   --  5.4 4.6  NEUTROABS 4.1  --   --   --  2.9 2.8  HGB 11.7* 10.6* 10.7* 10.4* 10.4* 9.9*  HCT 36.6 33.2* 33.2* 32.5* 32.1* 31.0*  MCV 92.4  --   --   --  91.7 92.3  PLT 157  --   --   --  137* 127*    Basic Metabolic Panel: Recent Labs  Lab 10/19/22 1652 10/19/22 2330 10/20/22 0508 10/21/22 1042  NA 138  --  140 139  K 3.5  --  3.5 3.1*  CL 107  --  107 105  CO2 22  --  24 24  GLUCOSE 91  --  74 87  BUN 20  --  20 16  CREATININE 0.98  --  0.84 0.81  CALCIUM 8.3*  --  8.6* 8.4*  MG  --  1.8 1.7 1.8    GFR: Estimated Creatinine Clearance: 47.4 mL/min (by C-G formula based on SCr of 0.81 mg/dL).  Liver Function Tests: Recent Labs  Lab 10/19/22 1652 10/20/22 0508 10/21/22 1042  AST '18 17 19  '$ ALT '9 11 11  '$ ALKPHOS 63 63 62  BILITOT 0.4 0.6 0.5  PROT 6.2* 5.6* 5.5*  ALBUMIN 3.3* 3.0* 3.0*    CBG: Recent Labs  Lab 10/20/22 2345 10/21/22 0528 10/21/22 0758 10/21/22 1157 10/21/22 1735  GLUCAP 112* 91 98 105* 140*     No results found for this or any previous visit (from the past 240 hour(s)).       Radiology Studies: No results found.      Scheduled Meds:  busPIRone  7.5 mg Oral QHS   Chlorhexidine Gluconate Cloth  6 each Topical Daily   DULoxetine  120 mg Oral QHS   insulin aspart  0-6 Units Subcutaneous Q4H   potassium chloride  40 mEq Oral Q4H   Continuous Infusions:   LOS: 0 days    Time spent: 40 minutes    Irine Seal, MD [facility]   To contact the attending provider between 7A-7P or the covering provider during after hours 7P-7A, please log into the web site [email or web address] and access using universal  AFB password for that web site. If you do not have the password, please call the hospital operator.  10/21/2022, 7:25 PM

## 2022-10-21 NOTE — Plan of Care (Signed)

## 2022-10-21 NOTE — Anesthesia Procedure Notes (Signed)
Procedure Name: MAC Date/Time: 10/21/2022 2:59 PM  Performed by: Niel Hummer, CRNAPre-anesthesia Checklist: Patient identified, Emergency Drugs available, Suction available and Patient being monitored Oxygen Delivery Method: Simple face mask

## 2022-10-21 NOTE — Progress Notes (Signed)
   10/21/22 1145  Unmeasured Output  Stool Occurrence 1  Stool Characteristics  Bowel Incontinence No  Stool Type Type 7 (Liquid consistency with no solid pieces)  Has the patient had three Type 7 stools in the last 24 hours? No  Stool Descriptors Brown  Stool Amount Small  Stool Source Rectum;Other (Comment) (post enema)   Minimal results from 2 fleet enemas, stool liquid form

## 2022-10-21 NOTE — Care Management Obs Status (Signed)
[facility] NOTIFICATION   Patient Details  Name: Lindsay Brooks MRN: [id number] Date of Birth: 10-13-1950   Medicare Observation Status Notification Given:  Yes    Purcell Mouton, RN 10/21/2022, 9:18 AM

## 2022-10-21 NOTE — Interval H&P Note (Signed)
History and Physical Interval Note:  10/21/2022 2:57 PM  Lindsay Brooks  has presented today for surgery, with the diagnosis of Hematochezia.  The various methods of treatment have been discussed with the patient and family. After consideration of risks, benefits and other options for treatment, the patient has consented to  Procedure(s): FLEXIBLE SIGMOIDOSCOPY (N/A) as a surgical intervention.  The patient's history has been reviewed, patient examined, no change in status, stable for surgery.  I have reviewed the patient's chart and labs.  Questions were answered to the patient's satisfaction.     [name]

## 2022-10-21 NOTE — TOC Progression Note (Signed)
Transition of Care [facility]) - Progression Note    Patient Details  Name: Lindsay Brooks MRN: [id number] Date of Birth: 08/25/1950  Transition of Care [facility]) CM/SW Contact  Purcell Mouton, RN Phone Number: 10/21/2022, 9:27 AM  Clinical Narrative:     Pt is very HOH, she asked that I call her daughter [name]. [name] was called, explained observation status to her. Also asked if [facility] was needed. [name] states that she is with pt, no need for [facility]. Pt have WC, RW, and Cane at home.   Expected Discharge Plan: Home/Self Care Barriers to Discharge: No Barriers Identified  Expected Discharge Plan and Services       Living arrangements for the past 2 months: Single Family Home                                       Social Determinants of Health (SDOH) Interventions SDOH Screenings   Food Insecurity: No Food Insecurity (10/20/2022)  Housing: Low Risk  (10/20/2022)  Transportation Needs: No Transportation Needs (10/20/2022)  Utilities: Not At Risk (10/20/2022)  Tobacco Use: High Risk (10/20/2022)    Readmission Risk Interventions     No data to display

## 2022-10-21 NOTE — Progress Notes (Signed)
Mobility Specialist - Progress Note   10/21/22 1027  Mobility  Activity Ambulated with assistance in hallway  Level of Assistance Contact guard assist, steadying assist  Assistive Device None  Distance Ambulated (ft) 100 ft  Activity Response Tolerated well  Mobility Referral Yes  $Mobility charge 1 Mobility   Pt received in bed and agreed to mobility. No c/o pain nor discomfort during session. Started getting somewhat shaky nearing EOS, pt also walks very quickly and tires out. Pt returned to bed with all needs met and family in room.  Roderick Pee Mobility Specialist

## 2022-10-21 NOTE — Op Note (Signed)
[facility] Patient Name: Lindsay Brooks Procedure Date: 10/21/2022 MRN: [id number] Attending MD: [name] , MD, [id number] Date of Birth: 03-02-1951 CSN: [id number] Age: 72 Admit Type: Inpatient Procedure:                Diagnostic flexible sigmoidoscopy Indications:              Hematochezia, Iron deficiecny anemia Providers:                Elmer Ramp. Tilden Dome, RN, William Dalton, Technician,                            [name], MD, Maudry Diego, CRNA Referring MD:             Herschel Senegal, MD Medicines:                Monitored Anesthesia Care Complications:            No immediate complications. Estimated Blood Loss:     Estimated blood loss: none. Procedure:                Pre-Anesthesia Assessment: - Prior to the                            procedure, a history and physical was performed,                            and patient medications and allergies were                            reviewed. The patient's tolerance of previous                            anesthesia was also reviewed. The risks and                            benefits of the procedure and the sedation options                            and risks were discussed with the patient. All                            questions were answered, and informed consent was                            obtained. Prior Anticoagulants: The patient has                            taken Plavix (clopidogrel), last dose was 3 days                            prior to procedure. ASA Grade Assessment: III - A                            patient with severe systemic disease. After  reviewing the risks and benefits, the patient was                            deemed in satisfactory condition to undergo the                            procedure. After obtaining informed consent, the                            scope was passed under direct vision. The [id number]                            [id number]  Olympus endoscope was introduced through                            the anus and advanced to the the proximal sigmoid                            colon. The flexible sigmoidoscopy was performed                            with moderate difficulty due to inadequate bowel                            prep. Successful completion of the procedure was                            aided by lavage. The patient tolerated the                            procedure well. The quality of the bowel                            preparation was fair. Scope In: Scope Out: Findings:      The perianal and digital rectal examinations were normal.      The colon (entire examined portion) appeared normal upto 50 cm.      Poor prep-aggressive lavage done. Impression:               - Preparation of the colon was fair at best.                           - Normal exam upto 50 cm                           - No specimens collected. Moderate Sedation:      MAC used.. Recommendation:           - Clear liquid diet daily.                           - Continue present medications.                           - Monitor serial CBC's. Procedure Code(s):        ---  Professional ---                           [phone], Sigmoidoscopy, flexible; diagnostic,                            including collection of specimen(s) by brushing or                            washing, when performed (separate procedure) Diagnosis Code(s):        --- Professional ---                           K92.1, Melena (includes Hematochezia)                           D50.9, Iron deficiency anemia, unspecified CPT copyright 2022 American Medical Association. All rights reserved. The codes documented in this report are preliminary and upon coder review may  be revised to meet current compliance requirements. [name], MD [name], MD 10/21/2022 3:34:25 PM This report has been signed electronically. Number of Addenda: 0

## 2022-10-21 NOTE — Anesthesia Preprocedure Evaluation (Addendum)
Anesthesia Evaluation  Patient identified by MRN, date of birth, ID band Patient awake    Reviewed: Allergy & Precautions, H&P , NPO status , Patient's Chart, lab work & pertinent test results  Airway Mallampati: III  TM Distance: >3 FB Neck ROM: Full    Dental no notable dental hx. (+) Edentulous Upper, Edentulous Lower,    Pulmonary shortness of breath, Current Smoker and Patient abstained from smoking.   Pulmonary exam normal breath sounds clear to auscultation       Cardiovascular Exercise Tolerance: Good hypertension, Pt. on medications + CAD and + Peripheral Vascular Disease  Normal cardiovascular exam Rhythm:Regular Rate:Normal     Neuro/Psych Seizures -,  PSYCHIATRIC DISORDERS Anxiety     negative neurological ROS  negative psych ROS   GI/Hepatic negative GI ROS, Neg liver ROS,GERD  Medicated,,  Endo/Other  diabetes, Type 2    Renal/GU Renal diseasenegative Renal ROS  negative genitourinary   Musculoskeletal negative musculoskeletal ROS (+)    Abdominal   Peds negative pediatric ROS (+)  Hematology negative hematology ROS (+) Blood dyscrasia, anemia   Anesthesia Other Findings   Reproductive/Obstetrics negative OB ROS                             Anesthesia Physical Anesthesia Plan  ASA: 4 and emergent  Anesthesia Plan: MAC   Post-op Pain Management: Minimal or no pain anticipated   Induction: Intravenous  PONV Risk Score and Plan: 2 and Propofol infusion  Airway Management Planned: Natural Airway and Simple Face Mask  Additional Equipment: None  Intra-op Plan:   Post-operative Plan:   Informed Consent: I have reviewed the patients History and Physical, chart, labs and discussed the procedure including the risks, benefits and alternatives for the proposed anesthesia with the patient or authorized representative who has indicated his/her understanding and acceptance.        Plan Discussed with: Anesthesiologist and CRNA  Anesthesia Plan Comments:        Anesthesia Quick Evaluation

## 2022-10-21 NOTE — Anesthesia Postprocedure Evaluation (Signed)
Anesthesia Post Note  Patient: Georgette Shell  Procedure(s) Performed: Goodnight     Patient location during evaluation: PACU Anesthesia Type: General Level of consciousness: awake and alert Pain management: pain level controlled Vital Signs Assessment: post-procedure vital signs reviewed and stable Respiratory status: spontaneous breathing, nonlabored ventilation, respiratory function stable and patient connected to nasal cannula oxygen Cardiovascular status: blood pressure returned to baseline and stable Postop Assessment: no apparent nausea or vomiting Anesthetic complications: no   No notable events documented.  Last Vitals:  Vitals:   10/21/22 1527 10/21/22 1539  BP: (!) 141/68 (!) 155/75  Pulse: 99 96  Resp: 19   Temp: 36.6 C   SpO2: 97% 98%    Last Pain:  Vitals:   10/21/22 1527  TempSrc: Oral  PainSc: 0-No pain                 Estalene Bergey

## 2022-10-22 LAB — GLUCOSE, CAPILLARY
Glucose-Capillary: 92 mg/dL (ref 70–99)
Glucose-Capillary: 147 mg/dL — ABNORMAL HIGH (ref 70–99)

## 2022-10-22 LAB — BASIC METABOLIC PANEL
Anion gap: 8 (ref 5–15)
BUN: 10 mg/dL (ref 8–23)
CO2: 24 mmol/L (ref 22–32)
Calcium: 8.8 mg/dL — ABNORMAL LOW (ref 8.9–10.3)
Chloride: 109 mmol/L (ref 98–111)
Creatinine, Ser: 0.73 mg/dL (ref 0.44–1.00)
GFR, Estimated: >60 mL/min (ref >60)
Glucose, Bld: 88 mg/dL (ref 70–99)
Potassium: 3.9 mmol/L (ref 3.5–5.1)
Sodium: 141 mmol/L (ref 135–145)

## 2022-10-22 LAB — CBC
HCT: 30.3 % — ABNORMAL LOW (ref 36.0–46.0)
Hemoglobin: 9.6 g/dL — ABNORMAL LOW (ref 12.0–15.0)
MCH: 29.5 pg (ref 26.0–34.0)
MCHC: 31.7 g/dL (ref 30.0–36.0)
MCV: 93.2 fL (ref 80.0–100.0)
Platelets: 124 10*3/uL — ABNORMAL LOW (ref 150–400)
RBC: 3.25 MIL/uL — ABNORMAL LOW (ref 3.87–5.11)
RDW: 15.9 % — ABNORMAL HIGH (ref 11.5–15.5)
WBC: 4.2 10*3/uL (ref 4.0–10.5)
nRBC: 0 % (ref 0.0–0.2)

## 2022-10-22 LAB — MAGNESIUM: Magnesium: 2 mg/dL (ref 1.7–2.4)

## 2022-10-22 MED ORDER — ASPIRIN EC 81 MG PO TBEC: 81.0000 mg | DELAYED_RELEASE_TABLET | Freq: Every day | ORAL | 11 refills | Status: AC

## 2022-10-22 MED ORDER — ADULT MULTIVITAMIN W/MINERALS CH
1.0000 | ORAL_TABLET | Freq: Every day | ORAL | Status: DC
  Administered 2022-10-22: 1 via ORAL
  Filled 2022-10-22: qty 1

## 2022-10-22 MED ORDER — HYDROCORTISONE (PERIANAL) 2.5 % EX CREA
TOPICAL_CREAM | Freq: Two times a day (BID) | CUTANEOUS | Status: DC
  Administered 2022-10-22: 1 via RECTAL
  Filled 2022-10-22: qty 28.35

## 2022-10-22 MED ORDER — CLOPIDOGREL BISULFATE 75 MG PO TABS: 75.0000 mg | ORAL_TABLET | Freq: Every day | ORAL | Status: DC

## 2022-10-22 MED ORDER — HYDROCORTISONE (PERIANAL) 2.5 % EX CREA: TOPICAL_CREAM | Freq: Two times a day (BID) | CUTANEOUS | 0 refills | Status: AC

## 2022-10-22 MED ORDER — HEPARIN SOD (PORK) LOCK FLUSH 100 UNIT/ML IV SOLN
500.0000 [IU] | INTRAVENOUS | Status: AC | PRN
  Administered 2022-10-22: 500 [IU]

## 2022-10-22 MED ORDER — SODIUM CHLORIDE 0.9% FLUSH
10.0000 mL | INTRAVENOUS | Status: DC | PRN
  Administered 2022-10-22: 10 mL

## 2022-10-22 MED ORDER — HYDRALAZINE HCL 50 MG PO TABS: 50.0000 mg | ORAL_TABLET | Freq: Three times a day (TID) | ORAL | Status: DC

## 2022-10-22 MED ORDER — NICOTINE 21 MG/24HR TD PT24: 21.0000 mg | MEDICATED_PATCH | Freq: Every day | TRANSDERMAL | 0 refills | Status: DC

## 2022-10-22 NOTE — Progress Notes (Signed)
Notified MD that patient had a small, bloody and runny bowel movement. GI was notified as well and re-evaluated patient. GI said that patient was ready for discharge and that the blood was coming from the patient's large external hemorrhoids.

## 2022-10-22 NOTE — Discharge Summary (Addendum)
Physician Discharge Summary  Lindsay Brooks [id number] DOB: 01-26-1951 DOA: 10/19/2022  PCP: Marco Collie, MD  Admit date: 10/19/2022 Discharge date: 10/22/2022  Time spent: 60 minutes  Recommendations for Outpatient Follow-up:  Follow-up with primary gastroenterologist in 1 to 2 weeks.  On follow-up patient will need a CBC done.  Follow-up on patient's external hemorrhoid. Follow-up with Marco Collie, MD in 2 weeks.  On follow-up patient need a CBC done to follow-up on hemoglobin.  Patient need a basic metabolic profile done to follow-up on electrolytes and renal function.  Patient's blood pressure need to be reassessed as patient's atenolol was discontinued.  Patient's hydralazine will be resumed 1 week postdischarge.  Patient's diabetes will need to be reassessed on follow-up.   Discharge Diagnoses:  Principal Problem:   Acute lower GI bleeding Active Problems:   Acute blood loss anemia   DM2 (diabetes mellitus, type 2) (HCC)   Hyperlipidemia   Tobacco abuse   Essential hypertension   Coronary atherosclerosis   GAD (generalized anxiety disorder)   Discharge Condition: Stable and improved.  Diet recommendation: Heart healthy  Filed Weights   10/20/22 0500 10/21/22 0500 10/22/22 0500  Weight: 56.4 kg 56.2 kg 56.1 kg    History of present illness:  HPI per Dr. Liliane Bade is a 72 y.o. female with medical history significant for hyperlipidemia, type 2 diabetes mellitus, essential hypertension, GAD, coronary artery disease status post drug-eluting stents x 2 to the RCA in 2005, who is admitted to [facility] on 10/19/2022 with suspected acute lower gastrointestinal bleed after presenting from home to [facility] ED complaining of bright red blood per rectum.    The patient reports 3 to 4 days of bright red blood per rectum, with most recent episode occurring in the emergency department this evening.  This has been associated with some mild sharp nonradiating left  lower quadrant abdominal discomfort in the absence of any recent preceding trauma.  Otherwise, denies any new abdominal discomfort, nor any recent melena.  Denies any associated nausea, vomiting, hematemesis.  No recent subjective fever, chills, rigors, or generalized myalgias.   Denies any history of alcohol abuse, nor any recent use of NSAIDs.  No known history of underlying liver disease.  Is on daily Plavix as an outpatient, but otherwise on no additional blood thinners, including no aspirin.  Patient unsure as to why she is on Plavix.  She documented history of an iliac stent as well as coronary artery disease status post placement of drug-eluting stents x 2 to the RCA in 2005, with ensuing left-sided coronary angiography in 2009 showing patent RCA stents without evidence of obstructive CAD   She denies any recent chest pain, shortness of breath, palpitations, diaphoresis, dizziness, presyncope, or syncope.       ED Course:  Vital signs in the ED were notable for the following: Afebrile; heart rates in the 80s to low [id number]; systolic blood pressures in the low 100s 130s; respiratory rate 16-26, oxygen saturation 94 to 99% on room air.   Labs were notable for the following: CMP notable for the following: Sodium 138, prn 22, BUN 20 compared to 19 on 10/05/2022, creatinine 0.98, glucose 91, calcium, adjusted for mild hypoalbuminemia noted to be 8.9, albumin 3.3, otherwise, liver enzymes within normal limits.  Serum magnesium level 1.8.  CBC notable for white blood cell count 6800, hemoglobin 11.7 compared to 13.7 on 10/05/2022, and associated with normocytic/normochromic properties as well as mildly elevated RDW, platelet count 157.  INR 1.2.  Type and screen ordered.   Imaging and additional notable ED work-up: CT abdomen/pelvis with contrast showed no evidence of acute intra-abdominal or acute intrapelvic process.   EDP contacted On-call gastroenterology, Dr. Collene Mares, Requesting formal consult, with  additional recommendations pending at this time.     While in the ED, the following were administered: Protonix 80 mg IV x 1 followed by initiation of Protonix drip.  Normal saline x 1 L bolus.   Subsequently, the patient was admitted for further evaluation management of presenting suspected acute lower gastrointestinal bleed complicated by acute blood loss anemia.    Hospital Course:  #1 acute lower GI bleed/bright red blood per rectum -Patient presented with crampy left lower quadrant abdominal pain, hematochezia.  Denies any fever or leukocytosis.  No sick contacts.  No hematemesis. -Patient initially placed on a PPI however felt not likely upper GI bleed and PPI discontinued. -CT abdomen and pelvis done showed no evidence of acute intra-abdominal or pelvic abnormality, hepatic steatosis, aortic atherosclerosis. -Aspirin, Plavix held during the hospitalization will be resumed on discharge.. -Patient hemoglobin noted to stabilized at 9.6.   -Patient seen in consultation by GI patient status post flexible sigmoidoscopy on 10/21/2022 which showed fair preparation of the colon normal exam after 50 cm, no specimens collected.   -On day of discharge patient noted to have had a small spot of blood on her adult diaper, patient reassessed by GI, Dr. Benson Norway and his exam showed large external hemorrhoids no blood noted on the exam glove.  Patient placed on Anusol cream which she will be discharged on.   -Outpatient follow-up with primary gastroenterologist and PCP.   2.  Acute blood loss anemia -Secondary to problem #1. -Hemoglobin remained stable and was 9.6 by day of discharge. -Outpatient follow-up with PCP and primary gastroenterologist.   3.  GAD -Patient was maintained on home regimen BuSpar, Cymbalta. -Ativan as needed   4.  Hypokalemia -Repleted during the hospitalization.   -Patient's oral potassium supplementation will be resumed on discharge.    5.  CAD -Plavix and aspirin,  fenofibrate, beta-blocker held during the hospitalization.   -Aspirin and Plavix will be resumed 1-week postdischarge.   -Outpatient follow-up with PCP.     6.  Hypertension -BP noted to be soft. -Patient's antihypertensive medications were held.   -BP improved.   -Patient's metoprolol and Diovan HCT will be resumed on discharge.   -Hydralazine will be resumed 1 week postdischarge.   -Atenolol noted on the med rec was discontinued.   -Outpatient follow-up with PCP.     7.  Tobacco abuse -Tobacco cessation. -Nicotine patch was placed. -Outpatient follow-up with PCP.   8.  Hyperlipidemia -Patient's fibrate was held during the hospitalization and will be resumed on discharge.    9.  Diabetes mellitus type 2 -Hemoglobin A1c 6.4 (10/20/2022) -Patient's Lantus was held during the hospitalization the patient maintained on sliding scale insulin. -Outpatient follow-up.  10 pressure injury, POA Pressure Injury 10/21/22 Buttocks Right Stage 2 -  Partial thickness loss of dermis presenting as a shallow open injury with a red, pink wound bed without slough. appears to be friction (Active)  10/21/22 0817  Location: Buttocks  Location Orientation: Right  Staging: Stage 2 -  Partial thickness loss of dermis presenting as a shallow open injury with a red, pink wound bed without slough.  Wound Description (Comments): appears to be friction  Present on Admission:         Procedures: CT abdomen  and pelvis 10/19/2022 Diagnostic flexible sigmoidoscopy: Per Dr. Collene Mares 10/21/2022    Consultations: Gastroenterology: Dr. Benson Norway 10/20/2022    Discharge Exam: Vitals:   10/22/22 0559 10/22/22 1344  BP: (!) 146/81 (!) 152/79  Pulse: (!) 104 99  Resp: 18 18  Temp: 98.2 F (36.8 C) 98.4 F (36.9 C)  SpO2: 99% 99%    General: NAD Cardiovascular: RRR no murmurs rubs or gallops.  No JVD.  No lower extremity edema. Respiratory: Lungs clear to auscultation bilaterally.  No wheezes, no crackles, no  rhonchi.  Fair air movement.  Speaking in full sentences.  Discharge Instructions   Discharge Instructions     Diet - low sodium heart healthy   Complete by: As directed    Discharge wound care:   Complete by: As directed    Pressure Injury 10/21/22 Buttocks Right Stage 2 -  Partial thickness loss of dermis presenting as a shallow open injury with a red, pink wound bed without slough. appears to be friction 1 day   Increase activity slowly   Complete by: As directed       Allergies as of 10/22/2022       Reactions   Amoxicillin Hives, Nausea And Vomiting   Claritin [loratadine] Other (See Comments)   Pt's daughter notified us of this allergy.   Insulins Nausea And Vomiting, Other (See Comments)   Patient unsure of name of insulin, will call us back   Vioxx [rofecoxib] Hives   Celecoxib Hives, Nausea And Vomiting   Liraglutide Nausea And Vomiting   Propranolol Diarrhea, Nausea And Vomiting        Medication List     STOP taking these medications    atenolol 50 MG tablet Commonly known as: TENORMIN   Lantus SoloStar 100 UNIT/ML Solostar Pen Generic drug: insulin glargine   Melatonin 1 MG Caps   zolpidem 10 MG tablet Commonly known as: AMBIEN       TAKE these medications    aspirin EC 81 MG tablet Take 1 tablet (81 mg total) by mouth daily. Start taking on: October 29, 2022 What changed: These instructions start on October 29, 2022. If you are unsure what to do until then, ask your doctor or other care provider.   busPIRone 7.5 MG tablet Commonly known as: BUSPAR Take 1 tablet by mouth in the morning, at noon, and at bedtime.   clopidogrel 75 MG tablet Commonly known as: PLAVIX Take 1 tablet (75 mg total) by mouth daily. Start taking on: October 29, 2022 What changed: These instructions start on October 29, 2022. If you are unsure what to do until then, ask your doctor or other care provider.   Cymbalta 60 MG capsule Generic drug: DULoxetine Take 120 mg by  mouth at bedtime.   famotidine 20 MG tablet Commonly known as: PEPCID Take 20 mg by mouth 2 (two) times daily.   fenofibrate 145 MG tablet Commonly known as: TRICOR Take 145 mg by mouth daily.   fexofenadine 180 MG tablet Commonly known as: ALLEGRA Take 180 mg by mouth daily.   gabapentin 600 MG tablet Commonly known as: NEURONTIN Take 600 mg by mouth 3 (three) times daily.   hydrALAZINE 50 MG tablet Commonly known as: APRESOLINE Take 1 tablet (50 mg total) by mouth 3 (three) times daily. Start taking on: October 29, 2022 What changed: These instructions start on October 29, 2022. If you are unsure what to do until then, ask your doctor or other care provider.   HYDROcodone-acetaminophen  5-325 MG tablet Commonly known as: Norco Take 1 tablet by mouth every 4 (four) hours as needed for moderate pain.   hydrocortisone 2.5 % rectal cream Commonly known as: ANUSOL-HC Place rectally 2 (two) times daily for 7 days.   LORazepam 0.5 MG tablet Commonly known as: ATIVAN Take 1 tablet (0.5 mg total) by mouth at bedtime as needed for sleep. May take one tab prior to imaging studies due to anxiety   metoprolol tartrate 25 MG tablet Commonly known as: LOPRESSOR Take 25 mg by mouth 2 (two) times daily.   Mounjaro 5 MG/0.5ML Pen Generic drug: tirzepatide SMARTSIG:0.5 Milliliter(s) SUB-Q Once a Week   nicotine 21 mg/24hr patch Commonly known as: NICODERM CQ - dosed in mg/24 hours Place 1 patch (21 mg total) onto the skin daily.   nitroGLYCERIN 0.4 MG SL tablet Commonly known as: NITROSTAT Place 0.4 mg under the tongue every 5 (five) minutes as needed. For chest pain   ondansetron 4 MG disintegrating tablet Commonly known as: ZOFRAN-ODT Take 4 mg by mouth every 6 (six) hours as needed.   pantoprazole 40 MG tablet Commonly known as: PROTONIX Take 40 mg by mouth 2 (two) times daily.   potassium chloride 10 MEQ tablet Commonly known as: KLOR-CON M Take 1 tablet (10 mEq total) by  mouth 3 (three) times daily.   Tyler Aas FlexTouch 100 UNIT/ML FlexTouch Pen Generic drug: insulin degludec Inject into the skin.   valsartan-hydrochlorothiazide 320-25 MG tablet Commonly known as: DIOVAN-HCT TAKE 1 TABLET BY ORAL ROUTE ONCE DAILY FOR HIGH BLOOD PRESSURE               Discharge Care Instructions  (From admission, onward)           Start     Ordered   10/22/22 0000  Discharge wound care:       Comments: Pressure Injury 10/21/22 Buttocks Right Stage 2 -  Partial thickness loss of dermis presenting as a shallow open injury with a red, pink wound bed without slough. appears to be friction 1 day   10/22/22 1432           Allergies  Allergen Reactions   Amoxicillin Hives and Nausea And Vomiting   Claritin [Loratadine] Other (See Comments)    Pt's daughter notified us of this allergy.   Insulins Nausea And Vomiting and Other (See Comments)    Patient unsure of name of insulin, will call us back   Vioxx [Rofecoxib] Hives   Celecoxib Hives and Nausea And Vomiting   Liraglutide Nausea And Vomiting   Propranolol Diarrhea and Nausea And Vomiting    Follow-up Information     Marco Collie, MD. Schedule an appointment as soon as possible for a visit in 2 week(s).   Specialty: Family Medicine Contact information[address] [place] [address] [phone]         Gastroenterologist. Schedule an appointment as soon as possible for a visit in 2 week(s).   Why: f/u in 1-2 weeks.                 The results of significant diagnostics from this hospitalization (including imaging, microbiology, ancillary and laboratory) are listed below for reference.    Significant Diagnostic Studies: CT ABDOMEN PELVIS W CONTRAST  Result Date: 10/19/2022 CLINICAL DATA:  Left lower quadrant pain with rectal bleeding EXAM: CT ABDOMEN AND PELVIS WITH CONTRAST TECHNIQUE: Multidetector CT imaging of the abdomen and pelvis was performed using the standard protocol following bolus  administration of intravenous contrast.  RADIATION DOSE REDUCTION: This exam was performed according to the departmental dose-optimization program which includes automated exposure control, adjustment of the mA and/or kV according to patient size and/or use of iterative reconstruction technique. CONTRAST:  47m OMNIPAQUE IOHEXOL 300 MG/ML  SOLN COMPARISON:  PET CT 12/26/2014, FINDINGS: Lower chest: Lung bases demonstrate no acute airspace disease. Coronary vascular calcification. Hepatobiliary: Hepatic steatosis. Status post cholecystectomy. No biliary dilatation. Pancreas: Unremarkable. No pancreatic ductal dilatation or surrounding inflammatory changes. Spleen: Normal in size without focal abnormality. Adrenals/Urinary Tract: Adrenal glands are unremarkable. Kidneys are normal, without renal calculi, focal lesion, or hydronephrosis. Bladder is unremarkable. Stomach/Bowel: Stomach is within normal limits. No evidence of bowel wall thickening, distention, or inflammatory changes. Vascular/Lymphatic: Moderate aortic atherosclerosis. No aneurysm. Stent in the left common iliac artery which appears grossly patent. No suspicious lymph nodes. Reproductive: Status post hysterectomy. No adnexal masses. Other: Negative for pelvic effusion or free air Musculoskeletal: No acute or suspicious osseous abnormality IMPRESSION: 1. No CT evidence for acute intra-abdominal or pelvic abnormality. 2. Hepatic steatosis. 3. Aortic atherosclerosis. Aortic Atherosclerosis (ICD10-I70.0). Electronically Signed   By: KDonavan FoilM.D.   On: 10/19/2022 19:53    Microbiology: No results found for this or any previous visit (from the past 240 hour(s)).   Labs: Basic Metabolic Panel: Recent Labs  Lab 10/19/22 1652 10/19/22 2330 10/20/22 0508 10/21/22 1042 10/22/22 0857  NA 138  --  140 139 141  K 3.5  --  3.5 3.1* 3.9  CL 107  --  107 105 109  CO2 22  --  '24 24 24  '$ GLUCOSE 91  --  74 87 88  BUN 20  --  '20 16 10  '$ CREATININE  0.98  --  0.84 0.81 0.73  CALCIUM 8.3*  --  8.6* 8.4* 8.8*  MG  --  1.8 1.7 1.8 2.0   Liver Function Tests: Recent Labs  Lab 10/19/22 1652 10/20/22 0508 10/21/22 1042  AST '18 17 19  '$ ALT '9 11 11  '$ ALKPHOS 63 63 62  BILITOT 0.4 0.6 0.5  PROT 6.2* 5.6* 5.5*  ALBUMIN 3.3* 3.0* 3.0*   No results for input(s): "LIPASE", "AMYLASE" in the last 168 hours. No results for input(s): "AMMONIA" in the last 168 hours. CBC: Recent Labs  Lab 10/19/22 1652 10/19/22 2034 10/19/22 2330 10/20/22 0200 10/20/22 0508 10/21/22 1042 10/22/22 0857  WBC 6.8  --   --   --  5.4 4.6 4.2  NEUTROABS 4.1  --   --   --  2.9 2.8  --   HGB 11.7*   < > 10.7* 10.4* 10.4* 9.9* 9.6*  HCT 36.6   < > 33.2* 32.5* 32.1* 31.0* 30.3*  MCV 92.4  --   --   --  91.7 92.3 93.2  PLT 157  --   --   --  137* 127* 124*   < > = values in this interval not displayed.   Cardiac Enzymes: No results for input(s): "CKTOTAL", "CKMB", "CKMBINDEX", "TROPONINI" in the last 168 hours. BNP: BNP (last 3 results) No results for input(s): "BNP" in the last 8760 hours.  ProBNP (last 3 results) No results for input(s): "PROBNP" in the last 8760 hours.  CBG: Recent Labs  Lab 10/21/22 1157 10/21/22 1735 10/21/22 2100 10/22/22 0742 10/22/22 1149  GLUCAP 105* 140* 91 92 147*       Signed:  DIrine SealMD.  [facility] 10/22/2022, 2:48 PM

## 2022-10-22 NOTE — Addendum Note (Signed)
Addendum  created 10/22/22 1809 by Janeece Riggers, MD   Clinical Note Signed

## 2022-10-22 NOTE — Progress Notes (Signed)
Patient was given discharge orders to go home. IV team was notified to come and deaccess patient's port-a-cath, which was done. Patient was given discharge paperwork/instructions. RN went over discharge paperwork/instructions with patient and patient's daughter. Patient and patient's daughter did not have any questions or concerns. Patient upon discharge was stable and left with all personal belongings.

## 2022-10-22 NOTE — Progress Notes (Addendum)
Subjective: No further hematochezia since admission.  Objective: Vital signs in last 24 hours: Temp:  [96.4 F (35.8 C)-98.6 F (37 C)] 98.2 F (36.8 C) (03/14 0559) Pulse Rate:  [88-104] 104 (03/14 0559) Resp:  [18-31] 18 (03/14 0559) BP: (120-167)/(62-83) 146/81 (03/14 0559) SpO2:  [93 %-100 %] 99 % (03/14 0559) Last BM Date : 10/21/22  Intake/Output from previous day: 03/13 0701 - 03/14 0700 In: 1329 [P.O.:680; I.V.:599; IV Piggyback:50] Out: -  Intake/Output this shift: Total I/O In: 240 [P.O.:240] Out: -   General appearance: alert and no distress GI: soft, non-tender; bowel sounds normal; no masses,  no organomegaly  Lab Results: Recent Labs    10/19/22 1652 10/19/22 2034 10/20/22 0200 10/20/22 0508 10/21/22 1042  WBC 6.8  --   --  5.4 4.6  HGB 11.7*   < > 10.4* 10.4* 9.9*  HCT 36.6   < > 32.5* 32.1* 31.0*  PLT 157  --   --  137* 127*   < > = values in this interval not displayed.   BMET Recent Labs    10/19/22 1652 10/20/22 0508 10/21/22 1042  NA 138 140 139  K 3.5 3.5 3.1*  CL 107 107 105  CO2 '22 24 24  '$ GLUCOSE 91 74 87  BUN '20 20 16  '$ CREATININE 0.98 0.84 0.81  CALCIUM 8.3* 8.6* 8.4*   LFT Recent Labs    10/21/22 1042  PROT 5.5*  ALBUMIN 3.0*  AST 19  ALT 11  ALKPHOS 62  BILITOT 0.5   PT/INR Recent Labs    10/19/22 1652  LABPROT 15.3*  INR 1.2   Hepatitis Panel No results for input(s): "HEPBSAG", "HCVAB", "HEPAIGM", "HEPBIGM" in the last 72 hours. C-Diff No results for input(s): "CDIFFTOX" in the last 72 hours. Fecal Lactopherrin No results for input(s): "FECLLACTOFRN" in the last 72 hours.  Studies/Results: No results found.  Medications: Scheduled:  busPIRone  7.5 mg Oral QHS   Chlorhexidine Gluconate Cloth  6 each Topical Daily   DULoxetine  120 mg Oral QHS   insulin aspart  0-6 Units Subcutaneous TID WC   Continuous:  Assessment/Plan: 1) Hematochezia - resolved. 2) Anemia - stable, but no new value  today.   Clinically the patient is well.  She denied any further hematochezia since admission.  The FFS was normal and the stool encountered was negative for any blood.  The source of bleeding is not known, but it appears resolved.  Plan: 1) Check CBC this AM. 2) Follow up with primary GI. 3) Signing off.   ADDENDUM: Rectal examination performed as there was a report of hematochezia.  She states that she had a small "spot" of blood in her adult diaper.  The exam showed large external hemorrhoids.  No blood was noted on the examination glove.  Cold compress can be used for any further hematochezia and she is to follow up with her primary GI in [facility].  LOS: 1 day   Lindsay Brooks D 10/22/2022, 6:55 AM

## 2022-10-22 NOTE — Plan of Care (Signed)

## 2022-10-22 NOTE — Progress Notes (Signed)
Initial Nutrition Assessment  INTERVENTION:   -Carnation Breakfast Essentials TID, each packet mixed with 8 ounces of 2% milk provides 13 grams of protein and 260 calories.   -Multivitamin with minerals daily  NUTRITION DIAGNOSIS:   Increased nutrient needs related to acute illness as evidenced by estimated needs.  GOAL:   Patient will meet greater than or equal to 90% of their needs  MONITOR:   PO intake, Supplement acceptance, Labs, Weight trends, I & O's  REASON FOR ASSESSMENT:   Malnutrition Screening Tool    ASSESSMENT:   72 year old female history of diabetes hypertension CAD status post PCI remotely, on Plavix presenting with bright red blood per rectum.  GI consulted.  Patient for flexible sigmoidoscopy today 10/21/2022.  Patient in room, very [place]. Daughter at bedside helped provide history. Pt with generally good appetite. Ate 100% of her breakfast of grits, hot chocolate and yogurt. States she is eager to get home. Brought this up multiple times during visit. States pt was started on [facility] in January 2024 but has lost a significant amount of weight on this. Daughter states pt will likely stop this medication so she can gain some weight back. Pt's daughter cooks for patient, pt eats a late breakfast and a dinner which varies.  Pt likes Carn[employer] Instant breakfast supplements, drinks this at home. Will order with meals.  Per patient, UBW is 145 lbs.  Patient has lost 19 lbs since 07/13/22 (13% wt loss x 3.5 months, significant for time frame).   Medications reviewed.  Labs reviewed: CBGs: 91-140   NUTRITION - FOCUSED PHYSICAL EXAM:  Pt declined.  Diet Order:   Diet Order             DIET SOFT Room service appropriate? Yes; Fluid consistency: Thin  Diet effective now                   EDUCATION NEEDS:   No education needs have been identified at this time  Skin:  Skin Assessment: Skin Integrity Issues: Skin Integrity Issues:: Stage II Stage II:  right buttocks  Last BM:  3/14 -type 7  Height:   Ht Readings from Last 1 Encounters:  10/19/22 '5\' 1"'$  (1.549 m)    Weight:   Wt Readings from Last 1 Encounters:  10/22/22 56.1 kg    BMI:  Body mass index is 23.37 kg/m.  Estimated Nutritional Needs:   Kcal:  1600-1800  Protein:  80-90g  Fluid:  1.8L/day  Clayton Bibles, MS, RD, LDN Inpatient Clinical Dietitian Contact information available via Amion

## 2022-10-23 ENCOUNTER — Other Ambulatory Visit: Payer: Self-pay

## 2022-10-23 ENCOUNTER — Encounter (HOSPITAL_COMMUNITY): Payer: Self-pay | Admitting: Gastroenterology

## 2022-10-23 DIAGNOSIS — G620 Drug-induced polyneuropathy: Secondary | ICD-10-CM

## 2022-10-23 MED ORDER — HYDROCODONE-ACETAMINOPHEN 5-325 MG PO TABS: 1.0000 | ORAL_TABLET | ORAL | 0 refills | Status: DC | PRN

## 2022-10-26 ENCOUNTER — Encounter: Payer: Self-pay | Admitting: Oncology

## 2022-10-26 ENCOUNTER — Telehealth: Payer: Self-pay

## 2022-10-26 NOTE — Telephone Encounter (Signed)
Dr Hinton Rao when would you like to set pt up for f/u visit? Her last appt missed as she was inpatient.

## 2022-10-27 ENCOUNTER — Encounter: Payer: Self-pay | Admitting: Oncology

## 2022-10-27 DIAGNOSIS — C50412 Malignant neoplasm of upper-outer quadrant of left female breast: Secondary | ICD-10-CM | POA: Diagnosis not present

## 2022-10-27 DIAGNOSIS — J449 Chronic obstructive pulmonary disease, unspecified: Secondary | ICD-10-CM | POA: Diagnosis not present

## 2022-10-27 DIAGNOSIS — G620 Drug-induced polyneuropathy: Secondary | ICD-10-CM | POA: Diagnosis not present

## 2022-11-02 NOTE — Addendum Note (Signed)
Addendum  created 11/02/22 1021 by Janeece Riggers, MD   Clinical Note Signed

## 2022-11-03 NOTE — Anesthesia Postprocedure Evaluation (Signed)
Anesthesia Post Note  Patient: Lindsay Brooks  Procedure(s) Performed: [place]     Patient location during evaluation: PACU Anesthesia Type: MAC Level of consciousness: awake and alert Pain management: pain level controlled Vital Signs Assessment: post-procedure vital signs reviewed and stable Respiratory status: spontaneous breathing, nonlabored ventilation, respiratory function stable and patient connected to nasal cannula oxygen Cardiovascular status: stable and blood pressure returned to baseline Postop Assessment: no apparent nausea or vomiting Anesthetic complications: no  No notable events documented.  Last Vitals:  Vitals:   10/22/22 0559 10/22/22 1344  BP: (!) 146/81 (!) 152/79  Pulse: (!) 104 99  Resp: 18 18  Temp: 36.8 C 36.9 C  SpO2: 99% 99%    Last Pain:  Vitals:   10/22/22 1344  TempSrc: Oral  PainSc:    Pain Goal: Patients Stated Pain Goal: 3 (10/20/22 2148)                 Carlton Buskey

## 2022-11-03 NOTE — Addendum Note (Signed)
Addendum  created 11/03/22 [id number] by Janeece Riggers, MD   Clinical Note Signed

## 2022-11-05 DIAGNOSIS — Z8719 Personal history of other diseases of the digestive system: Secondary | ICD-10-CM | POA: Diagnosis not present

## 2022-11-05 DIAGNOSIS — Z6824 Body mass index (BMI) 24.0-24.9, adult: Secondary | ICD-10-CM | POA: Diagnosis not present

## 2022-11-05 DIAGNOSIS — Z7689 Persons encountering health services in other specified circumstances: Secondary | ICD-10-CM | POA: Diagnosis not present

## 2022-11-05 DIAGNOSIS — E1169 Type 2 diabetes mellitus with other specified complication: Secondary | ICD-10-CM | POA: Diagnosis not present

## 2022-11-05 DIAGNOSIS — E785 Hyperlipidemia, unspecified: Secondary | ICD-10-CM | POA: Diagnosis not present

## 2022-11-05 DIAGNOSIS — I10 Essential (primary) hypertension: Secondary | ICD-10-CM | POA: Diagnosis not present

## 2022-11-09 DIAGNOSIS — E785 Hyperlipidemia, unspecified: Secondary | ICD-10-CM | POA: Diagnosis not present

## 2022-11-09 DIAGNOSIS — I10 Essential (primary) hypertension: Secondary | ICD-10-CM | POA: Diagnosis not present

## 2022-11-09 DIAGNOSIS — E1169 Type 2 diabetes mellitus with other specified complication: Secondary | ICD-10-CM | POA: Diagnosis not present

## 2022-11-11 NOTE — Progress Notes (Signed)
[facility]  [address] [place],  [place]  [address] [phone]   Clinic Day: 11/12/22  Referring physician: Marco Collie, MD   ASSESSMENT & PLAN:  Stage IA Left breast cancer This is a grade 3 triple negative 21 mm tumor for a T1cN0M0 with a Ki-67 of 40%. She has had resection with clear margins and negative sentinel node. However, I recommended adjuvant chemotherapy with docetaxol and cyclophosphamide every 3 weeks for 4 cycles to avoid anthracyclines.  She completed this 07/13/22. She has now completed adjuvant radiation in February, 2024.   Neuropathy She complains of numbness and pain of both legs and she feels unsteady.   Family history of breast cancer and BRCA positivity Yet she is BRCA negative.   Lung nodule This is a ground glass nodule of the right upper lobe which has shown indolent growth from 1.8 cm in April of 2019 to 2.2 cm currently. We will plan to monitor this with repeat imaging periodically.   Liver cirrhosis Liver is decreased in attenuation diffusely with mildly irregular margins. I don't think she was aware of this diagnosis but it may explain her increased toxicities.   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   Memory impairment This is consistent with early dementia. She also has significant hearing impairment.   Hypokalemia CMP reveals a potassium of 3.6, up from 3.0 and so I will order a small amount of  IV potassium to get this corrected. She will continue potassium to 3meq TID.   Plan:  Her chemistries today are pending. She will receive 1 liter normal saline IV tomorrow morning. Her daughter will keep check on her fluids and let me know if she needs any additional before her next appointment. I will see her back in 2 weeks with CBC, CMP, and possible IV fluids.  Once she stabilizes, we can put her on less  frequent follow-up.  She will need a repeat CT of the chest later in the year.  The patient agreed with the plan.  The patient was advised to call back if the symptoms worsen or if the condition fails to improve as anticipated.   I provided 20 minutes of face-to-face time during this this encounter and > 50% was spent counseling as documented under my assessment and plan.    Derwood Kaplan, MD [facility] [address] [place] [address] Dept: [phone] Dept Fax: [phone]    CHIEF COMPLAINT:  CC: Invasive ductal carcinoma   Current Treatment:  chemotherapy  HISTORY OF PRESENT ILLNESS:       Oncology History  Breast cancer of upper-outer quadrant of left female breast ([facility])  03/18/2022 Initial Diagnosis    Breast cancer of upper-outer quadrant of left female breast ([facility])    04/28/2022 Cancer Staging    Staging form: Breast, AJCC 8th Edition - Clinical stage from 04/28/2022: Stage IB (cT1c, cN0(sn), cM0, G3, ER-, PR-, HER2-) - Signed by Derwood Kaplan, MD on 04/28/2022 Histopathologic type: Infiltrating duct carcinoma, NOS Stage prefix: Initial diagnosis Method of lymph node assessment: Sentinel lymph node biopsy Nuclear grade: G3 Multigene prognostic tests performed: None Histologic grading system: 3 grade system Laterality: Left Tumor size (mm): 20 Lymph-vascular invasion (LVI): LVI not present (absent)/not identified Diagnostic confirmation: Positive histology Specimen type: Excision Staged by: Managing physician Menopausal status: Postmenopausal Ki-67 (%): 40  Stage used in treatment planning: Yes National guidelines used in treatment planning: Yes Type of national guideline used in treatment planning: NCCN    05/07/2022 -  Chemotherapy    Patient is on Treatment Plan : BREAST TC q21d     05/19/2022 Genetic Testing    Negative hereditary cancer genetic testing: no pathogenic variant  detected in [facility] Common Hereditary Cancers +RNA Panel.  Report date is May 19, 2022.     The Common Hereditary Cancers + RNA Panel offered by [facility] includes sequencing, deletion/duplication, and RNA testing of the following 47 genes: APC, ATM, AXIN2, BARD1, BMPR1A, BRCA1, BRCA2, BRIP1, CDH1, CDK4*, CDKN2A (p14ARF)*, CDKN2A (p16INK4a)*, CHEK2, CTNNA1, DICER1, EPCAM (Deletion/duplication testing only), GREM1 (promoter region deletion/duplication testing only), KIT, MEN1, MLH1, MSH2, MSH3, MSH6, MUTYH, NBN, NF1, NHTL1, PALB2, PDGFRA*, PMS2, POLD1, POLE, PTEN, RAD50, RAD51C, RAD51D, SDHB, SDHC, SDHD, SMAD4, SMARCA4. STK11, TP53, TSC1, TSC2, and VHL.  The following genes were evaluated for sequence changes only: SDHA and HOXB13 c.251G>A variant only.  RNA analysis is not performed for the * genes.      Lindsay Brooks is a 72 year old woman who had a screening mammogram on 01/27/2022 and was found to have a possible left breast mass. She does get yearly mammograms due to her strong family history. She thought  she could feel a lump after this was found. She had a diagnostic mammogram on 02/11/2022 with a persistent spiculated hyperdense mass with associated calcifications in the upper inner quadrant. Ultrasound confirmed an irregular hypoechoic mass with peripheral vascularity at 11:30, 3 cm from the nipple. This measures 2.2 cm and the left axilla was negative. She was referred to [name] after a biopsy was done on 02/17/2022, which confirmed a grade 3 invasive ductal carcinoma which is triple negative with a Ki-67 of 40%. [name] took her to surgery on 03/18/2022 for lumpectomy and sentinel lymph node. The final pathology revealed a 20 mm grade 3 invasive ductal carcinoma with DCIS. The margins were clear and the lymph node was negative for a T1cN0M0, stage IA. She has had a port placed.   INTERVAL HISTORY:  Coriana is seen here today for invasive ductal carcinoma for continued supportive care after completing  chemotherapy in December. Patient states that she feels ok and complains of knee and stomach pain. Patient informed me that she was in the hospital March 8-12 for rectal bleeding and received IV fluids before discharge. She has a follow-up appointment with Dr. Jennye Boroughs. I advised her to drink more fluids. She appears to have thrush and I will prescribe Diflucan and she will receive 1 liter of normal. I will refill potassium chloride 10 MEQ. I will also fill lorazepam. Her labs today are pending. I will see her back in 1 month with CBC and CMP. Her daughter knows to call back if she needs more IV fluids. She denies signs of infection such as sore throat, sinus drainage, cough, or urinary symptoms.  She denies fevers or recurrent chills. She denies pain. She denies nausea, vomiting, chest pain, dyspnea or cough. Her appetite is better and her weight has increased 6 pounds over last 3 weeks . She is accompanied at today's visit with her daughter.   HISTORY:  SOCIAL HISTORY She lives with her first daughter. She has 2 daughters. She smokes 1.5 packs a day, and has been smoking since 72 yo. She is not sure about quitting. She did smoke 2 packs per day for over 50 years. She doesn't drink other than  rare occasion. She is widowed. She has been married 4 times. She is disabled.   Menstrual History 72 years old when she had her first child She has had 3 children 58 years old when starting menstrual period Menopause started at 18 She has had a partial hysterectomy in her 21's and went back for complete hysterectomy/BSO later. No hormone replacement therapy   Family History  Problem Relation Age of Onset   Heart attack Mother    Allergies Mother    Heart attack Father    Allergies Father    Kidney disease Father    Hypertension Father    Melanoma Sister    Coronary artery disease Brother    Breast cancer Maternal Aunt    Breast cancer Maternal Aunt    Breast cancer Paternal Aunt        d. under 44    Pancreatic cancer Maternal Grandmother    Breast cancer Daughter        BRCA1+  Her sister had a brain tumor at age 51    Past Surgical History:  Procedure Laterality Date   APPENDECTOMY     BREAST BIOPSY     CARDIAC CATHETERIZATION  2009   patent RCA stent. mild CAD otherwise.    CATARACT EXTRACTION, BILATERAL  2016   CHOLECYSTECTOMY  2009   FLEXIBLE SIGMOIDOSCOPY N/A 10/21/2022   Procedure: FLEXIBLE SIGMOIDOSCOPY;  Surgeon: Charna Elizabeth, MD;  Location: [facility];  Service: Gastroenterology;  Laterality: N/A;   hysterectomy (other)     age 59   KNEE ARTHROSCOPY Left    left arm surgery     LEFT HEART CATHETERIZATION WITH CORONARY ANGIOGRAM N/A 01/16/2014   Procedure: LEFT HEART CATHETERIZATION WITH CORONARY ANGIOGRAM;  Surgeon: Micheline Chapman, MD;  Location: [facility] CATH LAB;  Service: Cardiovascular;  Laterality: N/A;   left leg angiography     stenting of the left common iliac artery   left rotator cuff repair     right coronary artery stent     Past Medical History:  Diagnosis Date   CAD (coronary artery disease)    a. s/p 2 CoStar DES to RCA 06/2004. b.  LHC 12/09:  EF 65%, oCFX 40%, oRCA 30%, then 30-40% leading into stented seg that is patent, (10-20% ISR), 40% beyond stent,;  c. EF 81%, poor exercise capacity, hypotensive BP response, + chest pain, normal nuclear images (overall low risk)   CKD (chronic kidney disease)    Colon polyps    a. s/p removal 01/2012 - was told one might be cancerous but was not diagnosed with colon cancer - plan is for f/u colonoscopy in 5 yrs.   GERD (gastroesophageal reflux disease)    Hx of echocardiogram    a. Echo 06/2011: EF 55-60%, Gr 1 diast dysfn, mild MR   Insomnia    Mini stroke    History of, in 2005 (before she was diagnosed with CAD).   Osteoporosis    Other and unspecified hyperlipidemia    Other isolated or specific phobias    Peripheral vascular disease, unspecified    a. s/p L common femoral & left illiac stenting  previously (patent by angio in 2007). - ABI 06/2011 normal. b. Carotid US 0-39% BICA 06/2011 (for f/u 2 yrs);   c.  ABIs 9/13:  normal (1.1 bilaterally)   Personal history of peptic ulcer disease    Remotely.   Psychiatric pseudoseizure    per patient and family   Tobacco use disorder  Type II or unspecified type diabetes mellitus without mention of complication, not stated as uncontrolled    Unspecified essential hypertension    REVIEW OF SYSTEMS:  Review of Systems  Constitutional:  Positive for fatigue (mild). Negative for appetite change, chills, diaphoresis, fever and unexpected weight change.  HENT:   Positive for hearing loss and sore throat (dry mouth). Negative for lump/mass, mouth sores, nosebleeds, tinnitus, trouble swallowing and voice change.   Eyes: Negative.  Negative for eye problems and icterus.  Respiratory: Negative.  Negative for chest tightness, cough, hemoptysis, shortness of breath and wheezing.   Cardiovascular:  Positive for chest pain (sore breast (especially site of her port)). Negative for leg swelling and palpitations.  Gastrointestinal:  Positive for abdominal pain. Negative for abdominal distention, blood in stool, constipation, diarrhea, nausea, rectal pain and vomiting.  Endocrine: Negative.   Genitourinary: Negative.  Negative for bladder incontinence, difficulty urinating, dyspareunia, dysuria, frequency, hematuria, menstrual problem, nocturia, pelvic pain, vaginal bleeding and vaginal discharge.   Musculoskeletal:  Negative for arthralgias, back pain, flank pain, gait problem, myalgias, neck pain and neck stiffness.       Knee pain  Skin: Negative.  Negative for itching, rash and wound.  Neurological:  Positive for light-headedness. Negative for dizziness, extremity weakness, gait problem, headaches, numbness, seizures and speech difficulty.  Hematological:  Positive for adenopathy. Does not bruise/bleed easily.  Psychiatric/Behavioral:  Positive for  sleep disturbance. Negative for confusion, decreased concentration, depression and suicidal ideas. The patient is not nervous/anxious.    VITALS:  Blood pressure (!) 112/55, pulse (!) 119, temperature 97.8 F (36.6 C), temperature source Oral, resp. rate 20, height 5\' 1"  (1.549 m), weight 137 lb 1.6 oz (62.2 kg), SpO2 96 %.     Wt Readings from Last 3 Encounters:  06/24/22 140 lb (63.5 kg)  06/22/22 132 lb 1.3 oz (59.9 kg)  06/18/22 137 lb 1.6 oz (62.2 kg)    Body mass index is 25.9 kg/m.   Performance status (ECOG): 0 - Asymptomatic   PHYSICAL EXAM:  Physical Exam Vitals and nursing note reviewed. Exam conducted with a chaperone present.  Constitutional:      General: She is not in acute distress.    Appearance: Normal appearance. She is normal weight. She is not ill-appearing, toxic-appearing or diaphoretic.  HENT:     Head: Normocephalic and atraumatic.     Right Ear: Tympanic membrane, ear canal and external ear normal. There is no impacted cerumen.     Left Ear: Tympanic membrane, ear canal and external ear normal. There is no impacted cerumen.     Nose: Nose normal. No congestion or rhinorrhea.     Mouth/Throat:     Mouth: Mucous membranes are moist.     Pharynx: Oropharynx is clear. No oropharyngeal exudate or posterior oropharyngeal erythema.     Comments: Thrush Eyes:     General: No scleral icterus.       Right eye: No discharge.        Left eye: No discharge.     Extraocular Movements: Extraocular movements intact.     Conjunctiva/sclera: Conjunctivae normal.     Pupils: Pupils are equal, round, and reactive to light.  Neck:     Vascular: No carotid bruit.  Cardiovascular:     Rate and Rhythm: Normal rate and regular rhythm.     Pulses: Normal pulses.     Heart sounds: Normal heart sounds. No murmur heard.    No friction rub. No gallop.  Pulmonary:  Effort: Pulmonary effort is normal. No respiratory distress.     Breath sounds: Normal breath sounds. No  stridor. No wheezing, rhonchi or rales.  Chest:     Chest wall: No tenderness.     Comments: Hyperpigmentation of the left breast with post radiation changes which are healing.    Abdominal:     General: Bowel sounds are normal. There is no distension.     Palpations: Abdomen is soft. There is no hepatomegaly, splenomegaly or mass.     Tenderness: There is no abdominal tenderness. There is no right CVA tenderness, left CVA tenderness, guarding or rebound.     Hernia: No hernia is present.  Musculoskeletal:        General: No swelling, tenderness, deformity or signs of injury. Normal range of motion.     Cervical back: Normal range of motion and neck supple. No rigidity or tenderness.     Right lower leg: No edema.     Left lower leg: No edema.  Lymphadenopathy:     Cervical: No cervical adenopathy.     Right cervical: No superficial, deep or posterior cervical adenopathy.    Left cervical: No superficial, deep or posterior cervical adenopathy.     Upper Body:     Right upper body: No supraclavicular, axillary or pectoral adenopathy.     Left upper body: No supraclavicular, axillary or pectoral adenopathy.  Skin:    General: Skin is warm and dry.     Coloration: Skin is not jaundiced or pale.     Findings: No bruising, erythema, lesion or rash.     Comments: Decreased trugor  Neurological:     General: No focal deficit present.     Mental Status: She is alert and oriented to person, place, and time. Mental status is at baseline.     Cranial Nerves: No cranial nerve deficit.     Sensory: No sensory deficit.     Motor: No weakness.     Coordination: Coordination normal.     Gait: Gait normal.     Deep Tendon Reflexes: Reflexes normal.  Psychiatric:        Mood and Affect: Mood normal.        Behavior: Behavior normal.        Thought Content: Thought content normal.        Judgment: Judgment normal.    LABS:    Component Ref Range & Units 2 wk ago (10/22/22) 3 wk  ago (10/21/22) 3 wk ago (10/20/22) 3 wk ago (10/19/22)  WBC 4.0 - 10.5 K/uL 4.2 4.6 5.4 6.8  RBC 3.87 - 5.11 MIL/uL 3.25 Low  3.36 Low  3.50 Low  3.96  Hemoglobin 12.0 - 15.0 g/dL 9.6 Low  9.9 Low  16.1 Low  11.7 Low   HCT 36.0 - 46.0 % 30.3 Low  31.0 Low  32.1 Low  36.6  MCV 80.0 - 100.0 fL 93.2 92.3 91.7 92.4  MCH 26.0 - 34.0 pg 29.5 29.5 29.7 29.5  MCHC 30.0 - 36.0 g/dL 09.6 04.5 40.9 81.1  RDW 11.5 - 15.5 % 15.9 High  15.7 High  15.9 High  15.6 High   Platelets 150 - 400 K/uL 124 Low  127 Low  137 Low     Component Ref Range & Units 2 wk ago (10/22/22) 3 wk ago (10/21/22) 3 wk ago (10/20/22) 3 wk ago (10/19/22)  Sodium 135 - 145 mmol/L 141 139 140 138  Potassium 3.5 - 5.1 mmol/L 3.9 3.1 Low  3.5 3.5  Chloride 98 - 111 mmol/L 109 105 107 107  CO2 22 - 32 mmol/L 24 24 24 22   Glucose, Bld 70 - 99 mg/dL 88 87 CM 74 CM 91 CM  BUN 8 - 23 mg/dL 10 16 20 20   Creatinine, Ser 0.44 - 1.00 mg/dL 4.09 8.11 9.14 7.82  Calcium 8.9 - 10.3 mg/dL 8.8 Low  8.4 Low  8.6 Low  8.3 Low    Component Ref Range & Units 2 wk ago (10/22/22) 3 wk ago (10/21/22) 3 wk ago (10/20/22) 3 wk ago (10/19/22)  Magnesium 1.7 - 2.4 mg/dL 2.0 1.8 CM 1.7 CM 1.8 CM   Component Ref Range & Units 2 wk ago (10/22/22)  Glucose-Capillary 70 - 99 mg/dL 956 High      STUDIES:  EXAM: 10/19/2022 CT ABDOMEN AND PELVIS WITH CONTRAST IMPRESSION: 1. No CT evidence for acute intra-abdominal or pelvic abnormality. 2. Hepatic steatosis. 3. Aortic atherosclerosis.(ICD10-I70.0).   EXAM:05/05/22 NUCLEAR MEDICINE WHOLE BODY BONE SCAN IMPRESSION: Uptake at LEFT tibial metaphysis especially medially, nonspecific; radiographic correlation recommended to exclude metastatic focus.   Focal abnormal tracer uptake at the LEFT temporo-occipital calvarium, corresponding to and focus of abnormal marrow signal on prior MR head exams back to 2010, nonspecific but likely benign.   No additional scintigraphic findings to  suggest osseous metastatic disease.   Diffuse calvarial uptake, favoring metabolic bone disease.    EXAM:05/05/22 CT CHEST WITH CONTRAST IMPRESSION: Ground glass nodule in the right upper lobe with a possible developing internal solid component. Lesion has shown indolent growth from 11/11/2017, at which time it measured approcimately 1.2 x 1.8 cm. Findings are worrisome for adenocarcinoma.  No definitive evidence of metastatic breast cancer. Steatotic cirrhotic liver   EXAM:03/18/2022 REPORT SURGICAL PATHOLOGY DIAGNOSIS Breast, lumpectomy, Left UOQ Invasive ductal carcinoma, grade 3, and ductal carcinoma in situ Ribbon clip and biopsy site present All margins negative for invasive carcinoma and DCIS Fibrocystic changes Lymph node, sentinel, biopsy One lymph node negative for tumor (0/1)   Estrogen receptor: 0% Progesterone receptor: 0% HER2 negative at 1+ Proliferation marker Ki-67: 40%   Bone density scan 01/27/2022 1.  Osteoporosis of the spine with a T score of -2.5, worse 2.  Left femur - osteoporosis with a T score of -2.7, slightly better 3.  Dual femur total mean-osteoporosis with a T score of -2.5, worse     Allergies  Allergen Reactions   Amoxicillin Hives and Nausea And Vomiting   Claritin [Loratadine] Other (See Comments)    Pt's daughter notified us of this allergy.   Insulins Nausea And Vomiting and Other (See Comments)    Patient unsure of name of insulin, will call us back   Vioxx [Rofecoxib] Hives   Celecoxib Hives and Nausea And Vomiting   Liraglutide Nausea And Vomiting   Propranolol Diarrhea and Nausea And Vomiting   Current Outpatient Medications on File Prior to Visit  Medication Sig Dispense Refill   aspirin EC 81 MG tablet Take 1 tablet (81 mg total) by mouth daily. 30 tablet 11   busPIRone (BUSPAR) 7.5 MG tablet Take 1 tablet by mouth in the morning, at noon, and at bedtime.     clopidogrel (PLAVIX) 75 MG tablet Take 1 tablet (75 mg total) by mouth  daily.     DULoxetine (CYMBALTA) 60 MG capsule Take 120 mg by mouth at bedtime.      famotidine (PEPCID) 20 MG tablet Take 20 mg by mouth 2 (two) times daily.  fenofibrate (TRICOR) 145 MG tablet Take 145 mg by mouth daily.     fexofenadine (ALLEGRA) 180 MG tablet Take 180 mg by mouth daily.     gabapentin (NEURONTIN) 600 MG tablet Take 600 mg by mouth 3 (three) times daily.     hydrALAZINE (APRESOLINE) 50 MG tablet Take 1 tablet (50 mg total) by mouth 3 (three) times daily.     HYDROcodone-acetaminophen (NORCO) 5-325 MG tablet Take 1 tablet by mouth every 4 (four) hours as needed for moderate pain. 30 tablet 0   metoprolol tartrate (LOPRESSOR) 25 MG tablet Take 25 mg by mouth 2 (two) times daily.     nicotine (NICODERM CQ - DOSED IN MG/24 HOURS) 21 mg/24hr patch Place 1 patch (21 mg total) onto the skin daily. 28 patch 0   nitroGLYCERIN (NITROSTAT) 0.4 MG SL tablet Place 0.4 mg under the tongue every 5 (five) minutes as needed. For chest pain     ondansetron (ZOFRAN-ODT) 4 MG disintegrating tablet Take 4 mg by mouth every 6 (six) hours as needed.     pantoprazole (PROTONIX) 40 MG tablet Take 40 mg by mouth 2 (two) times daily.     TRESIBA FLEXTOUCH 100 UNIT/ML FlexTouch Pen Inject into the skin.     valsartan-hydrochlorothiazide (DIOVAN-HCT) 320-25 MG tablet TAKE 1 TABLET BY ORAL ROUTE ONCE DAILY FOR HIGH BLOOD PRESSURE     No current facility-administered medications on file prior to visit.     I,Jasmine M Lassiter,acting as a scribe for Dellia Beckwith, MD.,have documented all relevant documentation on the behalf of Dellia Beckwith, MD,as directed by  Dellia Beckwith, MD while in the presence of Dellia Beckwith, MD.

## 2022-11-12 ENCOUNTER — Encounter: Payer: Self-pay | Admitting: Oncology

## 2022-11-12 ENCOUNTER — Inpatient Hospital Stay: Payer: 59

## 2022-11-12 ENCOUNTER — Other Ambulatory Visit: Payer: Self-pay | Admitting: Oncology

## 2022-11-12 ENCOUNTER — Inpatient Hospital Stay: Payer: 59 | Attending: Hematology and Oncology | Admitting: Oncology

## 2022-11-12 VITALS — BP 124/69 | Resp 18

## 2022-11-12 VITALS — BP 154/76 | HR 84 | Temp 98.1°F | Resp 18 | Ht 61.0 in | Wt 129.1 lb

## 2022-11-12 DIAGNOSIS — Z7902 Long term (current) use of antithrombotics/antiplatelets: Secondary | ICD-10-CM | POA: Diagnosis not present

## 2022-11-12 DIAGNOSIS — Z794 Long term (current) use of insulin: Secondary | ICD-10-CM | POA: Insufficient documentation

## 2022-11-12 DIAGNOSIS — Z7982 Long term (current) use of aspirin: Secondary | ICD-10-CM | POA: Diagnosis not present

## 2022-11-12 DIAGNOSIS — Z171 Estrogen receptor negative status [ER-]: Secondary | ICD-10-CM

## 2022-11-12 DIAGNOSIS — Z79899 Other long term (current) drug therapy: Secondary | ICD-10-CM | POA: Insufficient documentation

## 2022-11-12 DIAGNOSIS — C50412 Malignant neoplasm of upper-outer quadrant of left female breast: Secondary | ICD-10-CM

## 2022-11-12 DIAGNOSIS — R911 Solitary pulmonary nodule: Secondary | ICD-10-CM | POA: Diagnosis not present

## 2022-11-12 DIAGNOSIS — B370 Candidal stomatitis: Secondary | ICD-10-CM

## 2022-11-12 DIAGNOSIS — T451X5A Adverse effect of antineoplastic and immunosuppressive drugs, initial encounter: Secondary | ICD-10-CM

## 2022-11-12 DIAGNOSIS — G620 Drug-induced polyneuropathy: Secondary | ICD-10-CM

## 2022-11-12 DIAGNOSIS — E860 Dehydration: Secondary | ICD-10-CM

## 2022-11-12 DIAGNOSIS — K7460 Unspecified cirrhosis of liver: Secondary | ICD-10-CM | POA: Diagnosis not present

## 2022-11-12 DIAGNOSIS — F411 Generalized anxiety disorder: Secondary | ICD-10-CM

## 2022-11-12 DIAGNOSIS — Z923 Personal history of irradiation: Secondary | ICD-10-CM | POA: Diagnosis not present

## 2022-11-12 DIAGNOSIS — E876 Hypokalemia: Secondary | ICD-10-CM

## 2022-11-12 LAB — CMP (CANCER CENTER ONLY)
ALT: 16 U/L (ref 0–44)
AST: 21 U/L (ref 15–41)
Albumin: 3.9 g/dL (ref 3.5–5.0)
Alkaline Phosphatase: 72 U/L (ref 38–126)
Anion gap: 9 (ref 5–15)
BUN: 16 mg/dL (ref 8–23)
CO2: 25 mmol/L (ref 22–32)
Calcium: 9.2 mg/dL (ref 8.9–10.3)
Chloride: 106 mmol/L (ref 98–111)
Creatinine: 1.13 mg/dL — ABNORMAL HIGH (ref 0.44–1.00)
GFR, Estimated: 52 mL/min — ABNORMAL LOW (ref >60)
Glucose, Bld: 124 mg/dL — ABNORMAL HIGH (ref 70–99)
Potassium: 4.6 mmol/L (ref 3.5–5.1)
Sodium: 140 mmol/L (ref 135–145)
Total Bilirubin: 0.4 mg/dL (ref 0.3–1.2)
Total Protein: 7 g/dL (ref 6.5–8.1)

## 2022-11-12 LAB — CBC WITH DIFFERENTIAL (CANCER CENTER ONLY)
Abs Immature Granulocytes: 0.03 10*3/uL (ref 0.00–0.07)
Basophils Absolute: 0 10*3/uL (ref 0.0–0.1)
Basophils Relative: 0 %
Eosinophils Absolute: 0.3 10*3/uL (ref 0.0–0.5)
Eosinophils Relative: 4 %
HCT: 35.8 % — ABNORMAL LOW (ref 36.0–46.0)
Hemoglobin: 11.5 g/dL — ABNORMAL LOW (ref 12.0–15.0)
Immature Granulocytes: 0 %
Lymphocytes Relative: 22 %
Lymphs Abs: 1.5 10*3/uL (ref 0.7–4.0)
MCH: 30.9 pg (ref 26.0–34.0)
MCHC: 32.1 g/dL (ref 30.0–36.0)
MCV: 96.2 fL (ref 80.0–100.0)
Monocytes Absolute: 0.5 10*3/uL (ref 0.1–1.0)
Monocytes Relative: 7 %
Neutro Abs: 4.6 10*3/uL (ref 1.7–7.7)
Neutrophils Relative %: 67 %
Platelet Count: 192 10*3/uL (ref 150–400)
RBC: 3.72 MIL/uL — ABNORMAL LOW (ref 3.87–5.11)
RDW: 17.9 % — ABNORMAL HIGH (ref 11.5–15.5)
WBC Count: 6.8 10*3/uL (ref 4.0–10.5)
nRBC: 0 % (ref 0.0–0.2)

## 2022-11-12 MED ORDER — SODIUM CHLORIDE 0.9% FLUSH
10.0000 mL | Freq: Once | INTRAVENOUS | Status: AC | PRN
  Administered 2022-11-12: 10 mL

## 2022-11-12 MED ORDER — LORAZEPAM 0.5 MG PO TABS: 0.5000 mg | ORAL_TABLET | Freq: Every evening | ORAL | 0 refills | Status: DC | PRN

## 2022-11-12 MED ORDER — SODIUM CHLORIDE 0.9 % IV SOLN: Freq: Once | INTRAVENOUS | Status: AC

## 2022-11-12 MED ORDER — FLUCONAZOLE 100 MG PO TABS: 100.0000 mg | ORAL_TABLET | Freq: Every day | ORAL | 0 refills | Status: DC

## 2022-11-12 MED ORDER — HEPARIN SOD (PORK) LOCK FLUSH 100 UNIT/ML IV SOLN
500.0000 [IU] | Freq: Once | INTRAVENOUS | Status: AC | PRN
  Administered 2022-11-12: 500 [IU]

## 2022-11-12 MED ORDER — POTASSIUM CHLORIDE CRYS ER 10 MEQ PO TBCR: 10.0000 meq | EXTENDED_RELEASE_TABLET | Freq: Three times a day (TID) | ORAL | 5 refills | Status: DC

## 2022-11-12 NOTE — Patient Instructions (Signed)
Dehydration, Adult Dehydration is a condition in which there is not enough water or other fluids in the body. This happens when a person loses more fluids than they take in. Important organs cannot work right without the right amount of fluids. Any loss of fluids from the body can cause dehydration. Dehydration can be mild, worse, or very bad. It should be treated right away to keep it from getting very bad. What are the causes? Conditions that cause loss of water in the body. They include: Watery poop (diarrhea). Vomiting. Sweating a lot. Fever. Infection. Peeing (urinating) a lot. Not drinking enough fluids. Certain medicines, such as medicines that take extra fluid out of the body (diuretics). Lack of safe drinking water. Not being able to get enough water and food. What increases the risk? Having a long-term (chronic) illness that has not been treated the right way, such as: Diabetes. Heart disease. Kidney disease. Being 65 years of age or older. Having a disability. Living in a place that is high above the ground or sea (high in altitude). The thinner, drier air causes more fluid loss. Doing exercises that put stress on your body for a long time. Being active when in hot places. What are the signs or symptoms? Symptoms of dehydration depend on how bad it is. Mild or worse dehydration Thirst. Dry lips or dry mouth. Feeling dizzy or light-headed. Muscle cramps. Passing little pee or dark pee. Pee may be the color of tea. Headache. Very bad dehydration Changes in skin. Skin may: Be cold to the touch (clammy). Be blotchy or pale. Not go back to normal right after you pinch it and let it go. Little or no tears, pee, or sweat. Fast breathing. Low blood pressure. Weak pulse. Pulse that is more than 100 beats a minute when you are sitting still. Other changes, such as: Feeling very thirsty. Eyes that look hollow (sunken). Cold hands and feet. Being confused. Being very  tired (lethargic) or having trouble waking from sleep. Losing weight. Loss of consciousness. How is this treated? Treatment for this condition depends on how bad your dehydration is. Treatment should start right away. Do not wait until your condition gets very bad. Very bad dehydration is an emergency. You will need to go to a hospital. Mild or worse dehydration can be treated at home. You may be asked to: Drink more fluids. Drink an oral rehydration solution (ORS). This drink gives you the right amount of fluids, salts, and minerals (electrolytes). Very bad dehydration can be treated: With fluids through an IV tube. By correcting low levels of electrolytes in the body. By treating the problem that caused your dehydration. Follow these instructions at home: Oral rehydration solution If told by your doctor, drink an ORS: Make an ORS. Use instructions on the package. Start by drinking small amounts, about  cup (120 mL) every 5-10 minutes. Slowly drink more until you have had the amount that your doctor said to have.  Eating and drinking  Drink enough clear fluid to keep your pee pale yellow. If you were told to drink an ORS, finish the ORS first. Then, start slowly drinking other clear fluids. Drink fluids such as: Water. Do not drink only water. Doing that can make the salt (sodium) level in your body get too low. Water from ice chips you suck on. Fruit juice that you have added water to (diluted). Low-calorie sports drinks. Eat foods that have the right amounts of salts and minerals, such as bananas, oranges, potatoes,   tomatoes, or spinach. Do not drink alcohol. Avoid drinks that have caffeine or sugar. These include:: High-calorie sports drinks. Fruit juice that you did not add water to. Soda. Coffee or energy drinks. Avoid foods that are greasy or have a lot of fat or sugar. General instructions Take over-the-counter and prescription medicines only as told by your doctor. Do  not take sodium tablets. Doing that can make the salt level in your body get too high. Return to your normal activities as told by your doctor. Ask your doctor what activities are safe for you. Keep all follow-up visits. Your doctor may check and change your treatment. Contact a doctor if: You have pain in your belly (abdomen) and the pain: Gets worse. Stays in one place. You have a rash. You have a stiff neck. You get angry or annoyed more easily than normal. You are more tired or have a harder time waking than normal. You feel weak or dizzy. You feel very thirsty. Get help right away if: You have any symptoms of very bad dehydration. You vomit every time you eat or drink. Your vomiting gets worse, does not go away, or you vomit blood or green stuff. You are getting treatment, but symptoms are getting worse. You have a fever. You have a very bad headache. You have: Diarrhea that gets worse or does not go away. Blood in your poop (stool). This may cause poop to look black and tarry. No pee in 6-8 hours. Only a small amount of pee in 6-8 hours, and the pee is very dark. You have trouble breathing. These symptoms may be an emergency. Get help right away. Call 911. Do not wait to see if the symptoms will go away. Do not drive yourself to the hospital. This information is not intended to replace advice given to you by your health care provider. Make sure you discuss any questions you have with your health care provider. Document Revised: 02/23/2022 Document Reviewed: 02/23/2022 [employer] Patient Education  2023 [employer]  

## 2022-11-12 NOTE — Addendum Note (Signed)
Addended by: Juanetta Beets on: 11/12/2022 03:01 PM   Modules accepted: Orders

## 2022-11-13 ENCOUNTER — Telehealth: Payer: Self-pay | Admitting: Oncology

## 2022-11-13 NOTE — Telephone Encounter (Signed)
11/13/22 Spoke with daughter and confirmed next appt

## 2022-11-16 DIAGNOSIS — Z794 Long term (current) use of insulin: Secondary | ICD-10-CM | POA: Diagnosis not present

## 2022-11-16 DIAGNOSIS — E785 Hyperlipidemia, unspecified: Secondary | ICD-10-CM | POA: Diagnosis not present

## 2022-11-16 DIAGNOSIS — Z6824 Body mass index (BMI) 24.0-24.9, adult: Secondary | ICD-10-CM | POA: Diagnosis not present

## 2022-11-16 DIAGNOSIS — I10 Essential (primary) hypertension: Secondary | ICD-10-CM | POA: Diagnosis not present

## 2022-11-16 DIAGNOSIS — E1169 Type 2 diabetes mellitus with other specified complication: Secondary | ICD-10-CM | POA: Diagnosis not present

## 2022-11-19 DIAGNOSIS — E785 Hyperlipidemia, unspecified: Secondary | ICD-10-CM | POA: Diagnosis not present

## 2022-11-19 DIAGNOSIS — E1169 Type 2 diabetes mellitus with other specified complication: Secondary | ICD-10-CM | POA: Diagnosis not present

## 2022-11-19 DIAGNOSIS — I10 Essential (primary) hypertension: Secondary | ICD-10-CM | POA: Diagnosis not present

## 2022-11-20 DIAGNOSIS — R0603 Acute respiratory distress: Secondary | ICD-10-CM | POA: Diagnosis not present

## 2022-11-20 DIAGNOSIS — U071 COVID-19: Secondary | ICD-10-CM | POA: Diagnosis not present

## 2022-11-27 ENCOUNTER — Telehealth: Payer: Self-pay

## 2022-11-27 DIAGNOSIS — G620 Drug-induced polyneuropathy: Secondary | ICD-10-CM | POA: Diagnosis not present

## 2022-11-27 DIAGNOSIS — C50412 Malignant neoplasm of upper-outer quadrant of left female breast: Secondary | ICD-10-CM | POA: Diagnosis not present

## 2022-11-27 DIAGNOSIS — J449 Chronic obstructive pulmonary disease, unspecified: Secondary | ICD-10-CM | POA: Diagnosis not present

## 2022-11-27 NOTE — Telephone Encounter (Signed)
Patient's daughter notified of lab results.

## 2022-11-27 NOTE — Telephone Encounter (Signed)
-----   Message from Dellia Beckwith, MD sent at 11/26/2022  9:54 AM EDT ----- Regarding: call Tell daughter blood is looking better, anemia coming up, not so dry

## 2022-12-02 ENCOUNTER — Encounter: Payer: Self-pay | Admitting: Oncology

## 2022-12-02 DIAGNOSIS — Z720 Tobacco use: Secondary | ICD-10-CM | POA: Diagnosis not present

## 2022-12-02 DIAGNOSIS — I459 Conduction disorder, unspecified: Secondary | ICD-10-CM | POA: Diagnosis not present

## 2022-12-02 DIAGNOSIS — F17210 Nicotine dependence, cigarettes, uncomplicated: Secondary | ICD-10-CM | POA: Diagnosis not present

## 2022-12-02 DIAGNOSIS — I4510 Unspecified right bundle-branch block: Secondary | ICD-10-CM | POA: Diagnosis not present

## 2022-12-02 DIAGNOSIS — R0789 Other chest pain: Secondary | ICD-10-CM | POA: Diagnosis not present

## 2022-12-02 DIAGNOSIS — I6529 Occlusion and stenosis of unspecified carotid artery: Secondary | ICD-10-CM | POA: Diagnosis not present

## 2022-12-02 DIAGNOSIS — E1122 Type 2 diabetes mellitus with diabetic chronic kidney disease: Secondary | ICD-10-CM | POA: Diagnosis not present

## 2022-12-02 DIAGNOSIS — I2510 Atherosclerotic heart disease of native coronary artery without angina pectoris: Secondary | ICD-10-CM | POA: Diagnosis not present

## 2022-12-02 DIAGNOSIS — I10 Essential (primary) hypertension: Secondary | ICD-10-CM | POA: Diagnosis not present

## 2022-12-02 DIAGNOSIS — Z853 Personal history of malignant neoplasm of breast: Secondary | ICD-10-CM | POA: Diagnosis not present

## 2022-12-02 DIAGNOSIS — N183 Chronic kidney disease, stage 3 unspecified: Secondary | ICD-10-CM | POA: Diagnosis not present

## 2022-12-02 DIAGNOSIS — R9431 Abnormal electrocardiogram [ECG] [EKG]: Secondary | ICD-10-CM | POA: Diagnosis not present

## 2022-12-02 DIAGNOSIS — E876 Hypokalemia: Secondary | ICD-10-CM | POA: Diagnosis not present

## 2022-12-02 DIAGNOSIS — R0602 Shortness of breath: Secondary | ICD-10-CM | POA: Diagnosis not present

## 2022-12-02 DIAGNOSIS — R079 Chest pain, unspecified: Secondary | ICD-10-CM | POA: Diagnosis not present

## 2022-12-02 DIAGNOSIS — I739 Peripheral vascular disease, unspecified: Secondary | ICD-10-CM | POA: Diagnosis not present

## 2022-12-02 DIAGNOSIS — I129 Hypertensive chronic kidney disease with stage 1 through stage 4 chronic kidney disease, or unspecified chronic kidney disease: Secondary | ICD-10-CM | POA: Diagnosis not present

## 2022-12-02 DIAGNOSIS — Z955 Presence of coronary angioplasty implant and graft: Secondary | ICD-10-CM | POA: Diagnosis not present

## 2022-12-02 DIAGNOSIS — Z794 Long term (current) use of insulin: Secondary | ICD-10-CM | POA: Diagnosis not present

## 2022-12-02 DIAGNOSIS — E785 Hyperlipidemia, unspecified: Secondary | ICD-10-CM | POA: Diagnosis not present

## 2022-12-03 DIAGNOSIS — R531 Weakness: Secondary | ICD-10-CM | POA: Diagnosis not present

## 2022-12-03 DIAGNOSIS — R404 Transient alteration of awareness: Secondary | ICD-10-CM | POA: Diagnosis not present

## 2022-12-03 DIAGNOSIS — R55 Syncope and collapse: Secondary | ICD-10-CM | POA: Diagnosis not present

## 2022-12-03 DIAGNOSIS — R29810 Facial weakness: Secondary | ICD-10-CM | POA: Diagnosis not present

## 2022-12-03 DIAGNOSIS — R0602 Shortness of breath: Secondary | ICD-10-CM | POA: Diagnosis not present

## 2022-12-03 DIAGNOSIS — R479 Unspecified speech disturbances: Secondary | ICD-10-CM | POA: Diagnosis not present

## 2022-12-03 DIAGNOSIS — R569 Unspecified convulsions: Secondary | ICD-10-CM | POA: Diagnosis not present

## 2022-12-03 DIAGNOSIS — R0789 Other chest pain: Secondary | ICD-10-CM | POA: Diagnosis not present

## 2022-12-04 DIAGNOSIS — R569 Unspecified convulsions: Secondary | ICD-10-CM | POA: Diagnosis not present

## 2022-12-04 DIAGNOSIS — I4510 Unspecified right bundle-branch block: Secondary | ICD-10-CM | POA: Diagnosis not present

## 2022-12-09 DIAGNOSIS — E785 Hyperlipidemia, unspecified: Secondary | ICD-10-CM | POA: Diagnosis not present

## 2022-12-09 DIAGNOSIS — I10 Essential (primary) hypertension: Secondary | ICD-10-CM | POA: Diagnosis not present

## 2022-12-09 DIAGNOSIS — E1169 Type 2 diabetes mellitus with other specified complication: Secondary | ICD-10-CM | POA: Diagnosis not present

## 2022-12-10 DIAGNOSIS — I6529 Occlusion and stenosis of unspecified carotid artery: Secondary | ICD-10-CM | POA: Diagnosis not present

## 2022-12-10 NOTE — Progress Notes (Signed)
[facility]  [address] [place],  [place]  [address] [phone]   Clinic Day: 12/11/22  Referring physician: [name], MD   ASSESSMENT & PLAN:  Stage IA Left breast cancer This is a grade 3 triple negative 21 mm tumor for a T1cN0M0 with a Ki-67 of 40%. She has had resection with clear margins and negative sentinel node. However, I recommended adjuvant chemotherapy with docetaxol and cyclophosphamide every 3 weeks for 4 cycles to avoid anthracyclines.  She completed this 07/13/22. She has now completed adjuvant radiation in February, 2024.   Neuropathy She complains of numbness and pain of both legs and she feels unsteady.   Family history of breast cancer and BRCA positivity Yet she is BRCA negative.   Lung nodule This is a ground glass nodule of the right upper lobe which has shown indolent growth from 1.8 cm in April of 2019 to 2.2 cm currently. We will plan to monitor this with repeat imaging periodically.   Liver cirrhosis Liver is decreased in attenuation diffusely with mildly irregular margins. I don't think she was aware of this diagnosis but it may explain her increased toxicities.   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  She had another episode today and again when I saw her last.   Memory impairment This is consistent with early dementia. She also has significant hearing impairment.   Hypokalemia CMP reveals a potassium of 3.9. She will continue potassium to TID.  Hypotension She is on 3 different blood pressure medications and I have recommended that she cut all of them in half until she is able to see her primary care provider and/or cardiologist.  This is metoprolol, hydralazine and valsartan/hydrochlorothiazide.  Her blood pressure is 87/50 but did improve after we administered IV fluids.  She is also followed by  cardiology.    Plan:  She will receive 750 cc normal saline IV this afternoon. Her daughter will keep check on her fluids and let me know if she needs any additional before her next appointment. I will see her back in 4 weeks with CBC, and CMP.  Once she stabilizes, we can put her on less frequent follow-up.  She will need a repeat CT of the chest later in the year.  The patient and her daughter agreed with the plan.  The patient was advised to call back if the symptoms worsen or if the condition fails to improve as anticipated.   I provided 20 minutes of face-to-face time during this this encounter and > 50% was spent counseling as documented under my assessment and plan.    Dellia Beckwith, MD [facility] AT [facility] [address] [place] [address] Dept: [phone] Dept Fax: [phone]    CHIEF COMPLAINT:  CC: Invasive ductal carcinoma, triple negative   Current Treatment: Surveillance and supportive care  HISTORY OF PRESENT ILLNESS:       Oncology History  Breast cancer of upper-outer quadrant of left female breast (HCC)  03/18/2022 Initial Diagnosis    Breast cancer of upper-outer quadrant of left female breast (HCC)    04/28/2022 Cancer Staging    Staging form: Breast, AJCC 8th Edition - Clinical stage from 04/28/2022: Stage IB (cT1c, cN0(sn), cM0, G3, ER-, PR-, HER2-) - Signed by Dellia Beckwith, MD on 04/28/2022 Histopathologic type: Infiltrating duct carcinoma, NOS Stage prefix:  Initial diagnosis Method of lymph node assessment: Sentinel lymph node biopsy Nuclear grade: G3 Multigene prognostic tests performed: None Histologic grading system: 3 grade system Laterality: Left Tumor size (mm): 20 Lymph-vascular invasion (LVI): LVI not present (absent)/not identified Diagnostic confirmation: Positive histology Specimen type: Excision Staged by: Managing physician Menopausal status: Postmenopausal Ki-67  (%): 40 Stage used in treatment planning: Yes National guidelines used in treatment planning: Yes Type of national guideline used in treatment planning: NCCN    05/07/2022 -  Chemotherapy    Patient is on Treatment Plan : BREAST TC q21d     05/19/2022 Genetic Testing    Negative hereditary cancer genetic testing: no pathogenic variant detected in [facility] Common Hereditary Cancers +RNA Panel.  Report date is May 19, 2022.     The Common Hereditary Cancers + RNA Panel offered by [facility] includes sequencing, deletion/duplication, and RNA testing of the following 47 genes: APC, ATM, AXIN2, BARD1, BMPR1A, BRCA1, BRCA2, BRIP1, CDH1, CDK4*, CDKN2A (p14ARF)*, CDKN2A (p16INK4a)*, CHEK2, CTNNA1, DICER1, EPCAM (Deletion/duplication testing only), GREM1 (promoter region deletion/duplication testing only), KIT, MEN1, MLH1, MSH2, MSH3, MSH6, MUTYH, NBN, NF1, NHTL1, PALB2, PDGFRA*, PMS2, POLD1, POLE, PTEN, RAD50, RAD51C, RAD51D, SDHB, SDHC, SDHD, SMAD4, SMARCA4. STK11, TP53, TSC1, TSC2, and VHL.  The following genes were evaluated for sequence changes only: SDHA and HOXB13 c.251G>A variant only.  RNA analysis is not performed for the * genes.      Iretha is a 72 year old woman who had a screening mammogram on 01/27/2022 and was found to have a possible left breast mass. She does get yearly mammograms due to her strong family history. She thought  she could feel a lump after this was found. She had a diagnostic mammogram on 02/11/2022 with a persistent spiculated hyperdense mass with associated calcifications in the upper inner quadrant. Ultrasound confirmed an irregular hypoechoic mass with peripheral vascularity at 11:30, 3 cm from the nipple. This measures 2.2 cm and the left axilla was negative. She was referred to [name] after a biopsy was done on 02/17/2022, which confirmed a grade 3 invasive ductal carcinoma which is triple negative with a Ki-67 of 40%. [name] took her to surgery on 03/18/2022 for  lumpectomy and sentinel lymph node. The final pathology revealed a 20 mm grade 3 invasive ductal carcinoma with DCIS. The margins were clear and the lymph node was negative for a T1cN0M0, stage IA. She has had a port placed.   INTERVAL HISTORY:  Joshalynn is seen here today for invasive ductal carcinoma for continued supportive care after completing chemotherapy in December.  She then had radiation which was completed in February 2024.  Since she is triple negative, no hormonal therapy is appropriate.  Patient states that she feels fair but describes nightmares and difficulty with sleeping.  She has occasional pain in the left breast and I think some of her discomfort may be related to her recent radiation.  She has had several events recently.Marland Kitchen  She was hospitalized in mid March for rectal bleeding.  Dr. Camila Li follow-up had to be rescheduled due to not having her medication list and discharge papers.  She saw cardiology last Wednesday and did have an ultrasound of her neck.  She was having chest pressure so she was sent to the emergency room.  She walked out and signed out AMA even though they advised admission.  The chest pressure was in the lower left side.  She also had an episode of unresponsiveness and EMS took her to [facility],  who concluded that it was a seizure.  She did have numbness in the left side at the time but apparently was negative for a stroke.  Her blood pressure was low today at 87/50.  When the nurse stood her up to get a standing blood pressure the patient had an episode of shaking and tach tachypnea and the blood pressure was undetectable.  We are therefore giving her IV fluids it is 4:00 and so I cannot give her a full liter but we will give her about 7750 cc of normal saline.She denies signs of infection such as sore throat, sinus drainage, cough, or urinary symptoms.  She denies fevers or recurrent chills. She denies pain. She denies nausea, vomiting, chest pain, dyspnea  or cough. Her appetite is fair and her weight has decreased and pounds over last 6 months . She is accompanied at today's visit with her daughter.   HISTORY:  SOCIAL HISTORY She lives with her first daughter. She has 2 daughters. She smokes 1.5 packs a day, and has been smoking since 71 yo. She is not sure about quitting. She did smoke 2 packs per day for over 50 years. She doesn't drink other than rare occasion. She is widowed. She has been married 4 times. She is disabled.   Menstrual History 72 years old when she had her first child She has had 3 children 69 years old when starting menstrual period Menopause started at 19 She has had a partial hysterectomy in her 28's and went back for complete hysterectomy/BSO later. No hormone replacement therapy   Family History  Problem Relation Age of Onset   Heart attack Mother    Allergies Mother    Heart attack Father    Allergies Father    Kidney disease Father    Hypertension Father    Melanoma Sister    Coronary artery disease Brother    Breast cancer Maternal Aunt    Breast cancer Maternal Aunt    Breast cancer Paternal Aunt        d. under 51   Pancreatic cancer Maternal Grandmother    Breast cancer Daughter        BRCA1+  Her sister had a brain tumor at age 44    Past Surgical History:  Procedure Laterality Date   APPENDECTOMY     BREAST BIOPSY     CARDIAC CATHETERIZATION  2009   patent RCA stent. mild CAD otherwise.    CATARACT EXTRACTION, BILATERAL  2016   CHOLECYSTECTOMY  2009   FLEXIBLE SIGMOIDOSCOPY N/A 10/21/2022   Procedure: FLEXIBLE SIGMOIDOSCOPY;  Surgeon: Charna Elizabeth, MD;  Location: [facility];  Service: Gastroenterology;  Laterality: N/A;   hysterectomy (other)     age 81   KNEE ARTHROSCOPY Left    left arm surgery     LEFT HEART CATHETERIZATION WITH CORONARY ANGIOGRAM N/A 01/16/2014   Procedure: LEFT HEART CATHETERIZATION WITH CORONARY ANGIOGRAM;  Surgeon: Micheline Chapman, MD;  Location: [facility] CATH LAB;   Service: Cardiovascular;  Laterality: N/A;   left leg angiography     stenting of the left common iliac artery   left rotator cuff repair     right coronary artery stent     Past Medical History:  Diagnosis Date   CAD (coronary artery disease)    a. s/p 2 CoStar DES to RCA 06/2004. b.  LHC 12/09:  EF 65%, oCFX 40%, oRCA 30%, then 30-40% leading into stented seg that is patent, (10-20% ISR), 40% beyond stent,;  c. EF 81%, poor exercise capacity, hypotensive BP response, + chest pain, normal nuclear images (overall low risk)   CKD (chronic kidney disease)    Colon polyps    a. s/p removal 01/2012 - was told one might be cancerous but was not diagnosed with colon cancer - plan is for f/u colonoscopy in 5 yrs.   GERD (gastroesophageal reflux disease)    Hx of echocardiogram    a. Echo 06/2011: EF 55-60%, Gr 1 diast dysfn, mild MR   Insomnia    Mini stroke    History of, in 2005 (before she was diagnosed with CAD).   Osteoporosis    Other and unspecified hyperlipidemia    Other isolated or specific phobias    Peripheral vascular disease, unspecified (HCC)    a. s/p L common femoral & left illiac stenting previously (patent by angio in 2007). - ABI 06/2011 normal. b. Carotid US 0-39% BICA 06/2011 (for f/u 2 yrs);   c.  ABIs 9/13:  normal (1.1 bilaterally)   Personal history of peptic ulcer disease    Remotely.   Psychiatric pseudoseizure    per patient and family   Tobacco use disorder    Type II or unspecified type diabetes mellitus without mention of complication, not stated as uncontrolled    Unspecified essential hypertension    REVIEW OF SYSTEMS:  Review of Systems  Constitutional:  Positive for fatigue (mild). Negative for appetite change, chills, diaphoresis, fever and unexpected weight change.  HENT:   Positive for hearing loss and sore throat (dry mouth). Negative for lump/mass, mouth sores, nosebleeds, tinnitus, trouble swallowing and voice change.   Eyes: Negative.  Negative  for eye problems and icterus.  Respiratory: Negative.  Negative for chest tightness, cough, hemoptysis, shortness of breath and wheezing.   Cardiovascular:  Positive for chest pain (sore breast (especially site of her port)). Negative for leg swelling and palpitations.  Gastrointestinal:  Positive for abdominal pain. Negative for abdominal distention, blood in stool, constipation, diarrhea, nausea, rectal pain and vomiting.  Endocrine: Negative.   Genitourinary: Negative.  Negative for bladder incontinence, difficulty urinating, dyspareunia, dysuria, frequency, hematuria, menstrual problem, nocturia, pelvic pain, vaginal bleeding and vaginal discharge.   Musculoskeletal:  Negative for arthralgias, back pain, flank pain, gait problem, myalgias, neck pain and neck stiffness.       Knee pain  Skin: Negative.  Negative for itching, rash and wound.  Neurological:  Positive for light-headedness. Negative for dizziness, extremity weakness, gait problem, headaches, numbness, seizures and speech difficulty.  Hematological:  Positive for adenopathy. Does not bruise/bleed easily.  Psychiatric/Behavioral:  Positive for sleep disturbance. Negative for confusion, decreased concentration, depression and suicidal ideas. The patient is not nervous/anxious.    VITALS:  Blood pressure (!) 112/55, pulse (!) 119, temperature 97.8 F (36.6 C), temperature source Oral, resp. rate 20, height 5\' 1"  (1.549 m), weight 137 lb 1.6 oz (62.2 kg), SpO2 96 %.     Wt Readings from Last 3 Encounters:  06/24/22 140 lb (63.5 kg)  06/22/22 132 lb 1.3 oz (59.9 kg)  06/18/22 137 lb 1.6 oz (62.2 kg)    Body mass index is 25.9 kg/m.   Performance status (ECOG): 0 - Asymptomatic   PHYSICAL EXAM:  Physical Exam Vitals and nursing note reviewed. Exam conducted with a chaperone present.  Constitutional:      General: She is not in acute distress.    Appearance: Normal appearance. She is normal weight. She is not ill-appearing,  toxic-appearing or diaphoretic.  HENT:     Head: Normocephalic and atraumatic.     Right Ear: Tympanic membrane, ear canal and external ear normal. There is no impacted cerumen.     Left Ear: Tympanic membrane, ear canal and external ear normal. There is no impacted cerumen.     Nose: Nose normal. No congestion or rhinorrhea.     Mouth/Throat:     Mouth: Mucous membranes are moist.     Pharynx: Oropharynx is clear. No oropharyngeal exudate or posterior oropharyngeal erythema.     Comments: Thrush Eyes:     General: No scleral icterus.       Right eye: No discharge.        Left eye: No discharge.     Extraocular Movements: Extraocular movements intact.     Conjunctiva/sclera: Conjunctivae normal.     Pupils: Pupils are equal, round, and reactive to light.  Neck:     Vascular: No carotid bruit.  Cardiovascular:     Rate and Rhythm: Normal rate and regular rhythm.     Pulses: Normal pulses.     Heart sounds: Normal heart sounds. No murmur heard.    No friction rub. No gallop.  Pulmonary:     Effort: Pulmonary effort is normal. No respiratory distress.     Breath sounds: Normal breath sounds. No stridor. No wheezing, rhonchi or rales.  Chest:     Chest wall: No tenderness.     Comments: Hyperpigmentation of the left breast with post radiation changes which are healing.    Abdominal:     General: Bowel sounds are normal. There is no distension.     Palpations: Abdomen is soft. There is no hepatomegaly, splenomegaly or mass.     Tenderness: There is no abdominal tenderness. There is no right CVA tenderness, left CVA tenderness, guarding or rebound.     Hernia: No hernia is present.  Musculoskeletal:        General: No swelling, tenderness, deformity or signs of injury. Normal range of motion.     Cervical back: Normal range of motion and neck supple. No rigidity or tenderness.     Right lower leg: No edema.     Left lower leg: No edema.  Lymphadenopathy:     Cervical: No cervical  adenopathy.     Right cervical: No superficial, deep or posterior cervical adenopathy.    Left cervical: No superficial, deep or posterior cervical adenopathy.     Upper Body:     Right upper body: No supraclavicular, axillary or pectoral adenopathy.     Left upper body: No supraclavicular, axillary or pectoral adenopathy.  Skin:    General: Skin is warm and dry.     Coloration: Skin is not jaundiced or pale.     Findings: No bruising, erythema, lesion or rash.     Comments: Decreased trugor  Neurological:     General: No focal deficit present.     Mental Status: She is alert and oriented to person, place, and time. Mental status is at baseline.     Cranial Nerves: No cranial nerve deficit.     Sensory: No sensory deficit.     Motor: No weakness.     Coordination: Coordination normal.     Gait: Gait normal.     Deep Tendon Reflexes: Reflexes normal.  Psychiatric:        Mood and Affect: Mood normal.        Behavior: Behavior normal.        Thought  Content: Thought content normal.        Judgment: Judgment normal.    LABS:    CBC 4 wk ago (11/12/22) 1 mo ago (10/22/22) 1 mo ago (10/21/22) 1 mo ago (10/20/22)  WBC Count 4.0 - 10.5 K/uL 6.8 4.2 4.6 5.4  RBC 3.87 - 5.11 MIL/uL 3.72 Low  3.25 Low  3.36 Low  3.50 Low   Hemoglobin 12.0 - 15.0 g/dL 16.1 Low  9.6 Low  9.9 Low  10.4 Low   HCT 36.0 - 46.0 % 35.8 Low  30.3 Low  31.0 Low  32.1 Low   MCV 80.0 - 100.0 fL 96.2 93.2 92.3 91.7  MCH 26.0 - 34.0 pg 30.9 29.5 29.5 29.7  MCHC 30.0 - 36.0 g/dL 09.6 04.5 40.9 81.1  RDW 11.5 - 15.5 % 17.9 High  15.9 High  15.7 High  15.9 High   Platelet Count 150 - 400 K/uL 192 124 Low  127 Low  137 Low    CMP 4 wk ago (11/12/22) 1 mo ago (10/22/22) 1 mo ago (10/21/22) 1 mo ago (10/20/22) 1 mo ago (10/19/22) 2 mo ago (10/05/22)  Sodium 135 - 145 mmol/L 140 141 139 140 138 140 R  Potassium 3.5 - 5.1 mmol/L 4.6 3.9 3.1 Low  3.5 3.5 3.2 Abnormal  R  Chloride 98 - 111 mmol/L 106 109 105 107  107 106 R  CO2 22 - 32 mmol/L 25 24 24 24 22 24  Abnormal  R  Glucose, Bld 70 - 99 mg/dL 914 High  88 CM 87 CM 74 CM 91 CM 110 R  BUN 8 - 23 mg/dL 16 10 16 20 20 19  R  Creatinine 0.44 - 1.00 mg/dL 7.82 High  9.56 2.13 0.86 0.98 1.0 R  Calcium 8.9 - 10.3 mg/dL 9.2 8.8 Low  8.4 Low  8.6 Low  8.3 Low    Total Protein 6.5 - 8.1 g/dL 7.0  5.5 Low  5.6 Low  6.2 Low    Albumin 3.5 - 5.0 g/dL 3.9  3.0 Low  3.0 Low  3.3 Low    AST 15 - 41 U/L 21  19 17 18    ALT 0 - 44 U/L 16  11 11 9    Alkaline Phosphatase 38 - 126 U/L 72  62 63 63    Component Ref Range & Units 2 wk ago (10/22/22) 3 wk ago (10/21/22) 3 wk ago (10/20/22) 3 wk ago (10/19/22)  Magnesium 1.7 - 2.4 mg/dL 2.0 1.8 CM 1.7 CM 1.8 CM   Component Ref Range & Units 2 wk ago (10/22/22)  Glucose-Capillary 70 - 99 mg/dL 578 High      STUDIES:  EXAM: 10/19/2022 CT ABDOMEN AND PELVIS WITH CONTRAST IMPRESSION: 1. No CT evidence for acute intra-abdominal or pelvic abnormality. 2. Hepatic steatosis. 3. Aortic atherosclerosis.(ICD10-I70.0).     Allergies  Allergen Reactions   Amoxicillin Hives and Nausea And Vomiting   Claritin [Loratadine] Other (See Comments)    Pt's daughter notified us of this allergy.   Insulins Nausea And Vomiting and Other (See Comments)    Patient unsure of name of insulin, will call us back   Vioxx [Rofecoxib] Hives   Celecoxib Hives and Nausea And Vomiting   Liraglutide Nausea And Vomiting   Propranolol Diarrhea and Nausea And Vomiting   Current Outpatient Medications on File Prior to Visit  Medication Sig Dispense Refill   divalproex (DEPAKOTE) 250 MG DR tablet Take 250 mg by mouth 2 (two) times daily.  donepezil (ARICEPT) 5 MG tablet Take 5 mg by mouth daily.     JARDIANCE 25 MG TABS tablet Take 25 mg by mouth daily.     aspirin EC 81 MG tablet Take 1 tablet (81 mg total) by mouth daily. 30 tablet 11   busPIRone (BUSPAR) 7.5 MG tablet Take 1 tablet by mouth in the morning, at noon, and at  bedtime.     clopidogrel (PLAVIX) 75 MG tablet Take 1 tablet (75 mg total) by mouth daily.     DULoxetine (CYMBALTA) 60 MG capsule Take 120 mg by mouth at bedtime.      famotidine (PEPCID) 20 MG tablet Take 20 mg by mouth 2 (two) times daily.     fenofibrate (TRICOR) 145 MG tablet Take 145 mg by mouth daily.     fexofenadine (ALLEGRA) 180 MG tablet Take 180 mg by mouth daily.     fluconazole (DIFLUCAN) 100 MG tablet Take 1 tablet (100 mg total) by mouth daily. 10 tablet 0   gabapentin (NEURONTIN) 600 MG tablet Take 600 mg by mouth 3 (three) times daily.     hydrALAZINE (APRESOLINE) 50 MG tablet Take 1 tablet (50 mg total) by mouth 3 (three) times daily.     HYDROcodone-acetaminophen (NORCO) 5-325 MG tablet Take 1 tablet by mouth every 4 (four) hours as needed for moderate pain. 30 tablet 0   LORazepam (ATIVAN) 0.5 MG tablet Take 1 tablet (0.5 mg total) by mouth at bedtime as needed for sleep. May take one tab prior to imaging studies due to anxiety 30 tablet 0   metoprolol tartrate (LOPRESSOR) 25 MG tablet Take 25 mg by mouth 2 (two) times daily.     nicotine (NICODERM CQ - DOSED IN MG/24 HOURS) 21 mg/24hr patch Place 1 patch (21 mg total) onto the skin daily. 28 patch 0   nitroGLYCERIN (NITROSTAT) 0.4 MG SL tablet Place 0.4 mg under the tongue every 5 (five) minutes as needed. For chest pain     ondansetron (ZOFRAN-ODT) 4 MG disintegrating tablet Take 4 mg by mouth every 6 (six) hours as needed.     pantoprazole (PROTONIX) 40 MG tablet Take 40 mg by mouth 2 (two) times daily.     potassium chloride (KLOR-CON M) 10 MEQ tablet Take 1 tablet (10 mEq total) by mouth 3 (three) times daily. 90 tablet 5   TRESIBA FLEXTOUCH 100 UNIT/ML FlexTouch Pen Inject into the skin.     valsartan-hydrochlorothiazide (DIOVAN-HCT) 320-25 MG tablet TAKE 1 TABLET BY ORAL ROUTE ONCE DAILY FOR HIGH BLOOD PRESSURE     No current facility-administered medications on file prior to visit.     I,Jasmine M Lassiter,acting  as a scribe for Dellia Beckwith, MD.,have documented all relevant documentation on the behalf of Dellia Beckwith, MD,as directed by  Dellia Beckwith, MD while in the presence of Dellia Beckwith, MD.

## 2022-12-11 ENCOUNTER — Inpatient Hospital Stay: Payer: 59 | Attending: Hematology and Oncology | Admitting: Oncology

## 2022-12-11 ENCOUNTER — Inpatient Hospital Stay: Payer: 59

## 2022-12-11 ENCOUNTER — Encounter: Payer: Self-pay | Admitting: Oncology

## 2022-12-11 ENCOUNTER — Other Ambulatory Visit: Payer: Self-pay | Admitting: Oncology

## 2022-12-11 VITALS — BP 119/58 | HR 78 | Temp 97.6°F | Resp 18 | Ht 61.0 in | Wt 127.0 lb

## 2022-12-11 DIAGNOSIS — R911 Solitary pulmonary nodule: Secondary | ICD-10-CM | POA: Diagnosis not present

## 2022-12-11 DIAGNOSIS — C50412 Malignant neoplasm of upper-outer quadrant of left female breast: Secondary | ICD-10-CM | POA: Insufficient documentation

## 2022-12-11 DIAGNOSIS — E876 Hypokalemia: Secondary | ICD-10-CM | POA: Insufficient documentation

## 2022-12-11 DIAGNOSIS — Z171 Estrogen receptor negative status [ER-]: Secondary | ICD-10-CM

## 2022-12-11 DIAGNOSIS — Z08 Encounter for follow-up examination after completed treatment for malignant neoplasm: Secondary | ICD-10-CM | POA: Insufficient documentation

## 2022-12-11 DIAGNOSIS — E860 Dehydration: Secondary | ICD-10-CM | POA: Diagnosis not present

## 2022-12-11 DIAGNOSIS — F17210 Nicotine dependence, cigarettes, uncomplicated: Secondary | ICD-10-CM | POA: Insufficient documentation

## 2022-12-11 DIAGNOSIS — Z9221 Personal history of antineoplastic chemotherapy: Secondary | ICD-10-CM | POA: Insufficient documentation

## 2022-12-11 DIAGNOSIS — Z923 Personal history of irradiation: Secondary | ICD-10-CM | POA: Diagnosis not present

## 2022-12-11 DIAGNOSIS — D649 Anemia, unspecified: Secondary | ICD-10-CM | POA: Diagnosis not present

## 2022-12-11 LAB — BASIC METABOLIC PANEL
BUN: 29 — AB (ref 4–21)
CO2: 25 — AB (ref 13–22)
Chloride: 106 (ref 99–108)
Creatinine: 1.3 — AB (ref 0.5–1.1)
Glucose: 136
Potassium: 3.9 mEq/L (ref 3.5–5.1)
Sodium: 138 (ref 137–147)

## 2022-12-11 LAB — CBC AND DIFFERENTIAL
HCT: 37 (ref 36–46)
Hemoglobin: 12.1 (ref 12.0–16.0)
Neutrophils Absolute: 6.97
Platelets: 251 10*3/uL (ref 150–400)
WBC: 10.1

## 2022-12-11 LAB — COMPREHENSIVE METABOLIC PANEL
Albumin: 4 (ref 3.5–5.0)
Calcium: 9 (ref 8.7–10.7)
EGFR: 40

## 2022-12-11 LAB — CBC: RBC: 3.83 — AB (ref 3.87–5.11)

## 2022-12-11 LAB — HEPATIC FUNCTION PANEL
ALT: 11 U/L (ref 7–35)
AST: 94 — AB (ref 13–35)
Alkaline Phosphatase: 99 (ref 25–125)
Bilirubin, Total: 0.4

## 2022-12-11 MED ORDER — SODIUM CHLORIDE 0.9% FLUSH
3.0000 mL | Freq: Once | INTRAVENOUS | Status: AC | PRN
  Administered 2022-12-11: 3 mL

## 2022-12-11 MED ORDER — HEPARIN SOD (PORK) LOCK FLUSH 100 UNIT/ML IV SOLN
500.0000 [IU] | Freq: Once | INTRAVENOUS | Status: AC | PRN
  Administered 2022-12-11: 500 [IU]

## 2022-12-11 MED ORDER — SODIUM CHLORIDE 0.9 % IV SOLN: Freq: Once | INTRAVENOUS | Status: AC

## 2022-12-11 MED ORDER — SODIUM CHLORIDE 0.9% FLUSH: 10.0000 mL | Freq: Once | INTRAVENOUS | Status: DC | PRN

## 2022-12-11 NOTE — Addendum Note (Signed)
Addended by: Domenic Schwab on: 12/11/2022 04:31 PM   Modules accepted: Orders

## 2022-12-15 DIAGNOSIS — C50912 Malignant neoplasm of unspecified site of left female breast: Secondary | ICD-10-CM | POA: Diagnosis not present

## 2022-12-15 DIAGNOSIS — Z6823 Body mass index (BMI) 23.0-23.9, adult: Secondary | ICD-10-CM | POA: Diagnosis not present

## 2022-12-15 DIAGNOSIS — R569 Unspecified convulsions: Secondary | ICD-10-CM | POA: Diagnosis not present

## 2022-12-20 DIAGNOSIS — R0603 Acute respiratory distress: Secondary | ICD-10-CM | POA: Diagnosis not present

## 2022-12-20 DIAGNOSIS — U071 COVID-19: Secondary | ICD-10-CM | POA: Diagnosis not present

## 2022-12-24 NOTE — Progress Notes (Signed)
Lindsay Brooks [facility]  [address] [place],  [place]  [address] [phone]   Clinic Lindsay: 12/25/2022  Referring physician: [name], MD   ASSESSMENT & PLAN:  Stage IA Left breast cancer This is a grade 3 triple negative 21 mm tumor for a T1cN0M0 with a Ki-67 of 40%. She has had resection with clear margins and negative sentinel node. However, I recommended adjuvant chemotherapy with docetaxol and cyclophosphamide every 3 weeks for 4 cycles to avoid anthracyclines.  She completed this 07/13/22. She has now completed adjuvant radiation in February, 2024.   Neuropathy She complains of numbness and pain of both legs and she feels unsteady. Some of this was present even prior to chemotherapy but did worsen with her treatment.    Family history of breast cancer and BRCA positivity Yet she is BRCA negative.   Lung nodule This is a ground glass nodule of the right upper lobe which has shown indolent growth from 1.8 cm in April of 2019 to 2.2 cm as of September of 2023. We will plan to monitor this with repeat imaging periodically.   Liver cirrhosis Liver is decreased in attenuation diffusely with mildly irregular margins. I don't think she was aware of this diagnosis but it may explain her increased toxicities.   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 She has these fairly regularly.    Memory impairment This is consistent with early dementia. She also has significant hearing impairment.   Hypokalemia CMP reveals a potassium of 3.6, up from 3.0 and so I will order a small amount of  IV potassium to get this corrected. She will continue potassium 10 meq TID.   Plan:  Patient informed me that she is supposed to schedule a appointment with a nerve doctor and see her cardiologist to discuss the mild stenosis of the left carotid artery. She informed me that  she would like her port removed and I will contact Dr. Georgiana Shore about that. Her WBC is 7.6, hemoglobin 12.0, and platelet count 184,000 today. Her CMP is normal otherwise a slightly elevated glucose of 175. Her Bun came down from 29 to 18 with a normal creatinine. I will see her back in 2 weeks with CBC and CMP.  We will plan a repeat CT of the chest later this year. The patient agreed with the plan.  The patient was advised to call back if the symptoms worsen or if the condition fails to improve as anticipated.   I provided 20 minutes of face-to-face time during this this encounter and > 50% was spent counseling as documented under my assessment and plan.    Dellia Beckwith, MD [facility] AT [facility] [address] [place] [address] Dept: [phone] Dept Fax: [phone]    CHIEF COMPLAINT:  CC: Invasive ductal carcinoma   Current Treatment:  chemotherapy  HISTORY OF PRESENT ILLNESS:       Oncology History  Breast cancer of upper-outer quadrant of left female breast (HCC)  03/18/2022 Initial Diagnosis    Breast cancer of upper-outer quadrant of left female breast (HCC)    04/28/2022 Cancer Staging    Staging form: Breast, AJCC 8th Edition - Clinical stage from 04/28/2022: Stage IB (cT1c, cN0(sn), cM0, G3, ER-, PR-, HER2-) - Signed by Dellia Beckwith, MD on 04/28/2022 Histopathologic type: Infiltrating duct carcinoma, NOS Stage prefix: Initial diagnosis  Method of lymph node assessment: Sentinel lymph node biopsy Nuclear grade: G3 Multigene prognostic tests performed: None Histologic grading system: 3 grade system Laterality: Left Tumor size (mm): 20 Lymph-vascular invasion (LVI): LVI not present (absent)/not identified Diagnostic confirmation: Positive histology Specimen type: Excision Staged by: Managing physician Menopausal status: Postmenopausal Ki-67 (%): 40 Stage used in treatment planning: Yes National  guidelines used in treatment planning: Yes Type of national guideline used in treatment planning: NCCN    05/07/2022 -  Chemotherapy    Patient is on Treatment Plan : BREAST TC q21d     05/19/2022 Genetic Testing    Negative hereditary cancer genetic testing: no pathogenic variant detected in [facility] Common Hereditary Cancers +RNA Panel.  Report date is May 19, 2022.     The Common Hereditary Cancers + RNA Panel offered by [facility] includes sequencing, deletion/duplication, and RNA testing of the following 47 genes: APC, ATM, AXIN2, BARD1, BMPR1A, BRCA1, BRCA2, BRIP1, CDH1, CDK4*, CDKN2A (p14ARF)*, CDKN2A (p16INK4a)*, CHEK2, CTNNA1, DICER1, EPCAM (Deletion/duplication testing only), GREM1 (promoter region deletion/duplication testing only), KIT, MEN1, MLH1, MSH2, MSH3, MSH6, MUTYH, NBN, NF1, NHTL1, PALB2, PDGFRA*, PMS2, POLD1, POLE, PTEN, RAD50, RAD51C, RAD51D, SDHB, SDHC, SDHD, SMAD4, SMARCA4. STK11, TP53, TSC1, TSC2, and VHL.  The following genes were evaluated for sequence changes only: SDHA and HOXB13 c.251G>A variant only.  RNA analysis is not performed for the * genes.      Lindsay Brooks is a 72 year old woman who had a screening mammogram on 01/27/2022 and was found to have a possible left breast mass. She does get yearly mammograms due to her strong family history. She thought  she could feel a lump after this was found. She had a diagnostic mammogram on 02/11/2022 with a persistent spiculated hyperdense mass with associated calcifications in the upper inner quadrant. Ultrasound confirmed an irregular hypoechoic mass with peripheral vascularity at 11:30, 3 cm from the nipple. This measures 2.2 cm and the left axilla was negative. She was referred to [name] after a biopsy was done on 02/17/2022, which confirmed a grade 3 invasive ductal carcinoma which is triple negative with a Ki-67 of 40%. [name] took her to surgery on 03/18/2022 for lumpectomy and sentinel lymph node. The final pathology revealed  a 20 mm grade 3 invasive ductal carcinoma with DCIS. The margins were clear and the lymph node was negative for a T1cN0M0, stage IA. She has had a port placed and had 4 cycles of TC chemotherapy, completed in December of 2023. She then had radiation, completed in February of 2024, but required a great deal of supportive care.   INTERVAL HISTORY:  Avilynn is seen here today for invasive ductal carcinoma for continued supportive care after completing chemotherapy in December, 2023 and completing radiation in February, 2024. Patient states that she feels ok and has no complaints of pain but states that her balance is off and she walks to the left more. She informed me that she is supposed to schedule a appointment with a neurologist and see her cardiologist to discuss the mild stenosis of the left carotid artery. She informed me that she would like her port removed and I will contact Dr. Georgiana Shore about that. Her WBC is 7.6, hemoglobin 12.0, and platelet count 184,000 today. Her CMP is normal other than a slightly elevated glucose of 175. Her Bun came down from 29 to 18 with a normal creatinine. I will see her back in 2 weeks with CBC and CMP. We will plan a repeat CT of the  chest later this year. She denies signs of infection such as sore throat, sinus drainage, or urinary symptoms.  She denies fevers or recurrent chills. She denies pain. She denies nausea, vomiting, chest pain, dyspnea or cough. Her appetite is better and her weight has decreased 1 pounds over last 1.5 month .She is accompanied at today's visit by her daughter.   HISTORY:  SOCIAL HISTORY She lives with her first daughter. She has 2 daughters. She smokes 1.5 packs a Lindsay, and has been smoking since 72 yo. She is not sure about quitting. She did smoke 2 packs per Lindsay for over 50 years. She doesn't drink other than rare occasion. She is widowed. She has been married 4 times. She is disabled.   Menstrual History 72 years old when she had her  first child She has had 3 children 12 years old when starting menstrual period Menopause started at 70 She has had a partial hysterectomy in her 24's and went back for complete hysterectomy/BSO later. No hormone replacement therapy   Family History  Problem Relation Age of Onset   Heart attack Mother    Allergies Mother    Heart attack Father    Allergies Father    Kidney disease Father    Hypertension Father    Melanoma Sister    Coronary artery disease Brother    Breast cancer Maternal Aunt    Breast cancer Maternal Aunt    Breast cancer Paternal Aunt        d. under 40   Pancreatic cancer Maternal Grandmother    Breast cancer Daughter        BRCA1+  Her sister had a brain tumor at age 72    Past Surgical History:  Procedure Laterality Date   APPENDECTOMY     BREAST BIOPSY     CARDIAC CATHETERIZATION  2009   patent RCA stent. mild CAD otherwise.    CATARACT EXTRACTION, BILATERAL  2016   CHOLECYSTECTOMY  2009   FLEXIBLE SIGMOIDOSCOPY N/A 10/21/2022   Procedure: FLEXIBLE SIGMOIDOSCOPY;  Surgeon: Charna Elizabeth, MD;  Location: [facility];  Service: Gastroenterology;  Laterality: N/A;   hysterectomy (other)     age 18   KNEE ARTHROSCOPY Left    left arm surgery     LEFT HEART CATHETERIZATION WITH CORONARY ANGIOGRAM N/A 01/16/2014   Procedure: LEFT HEART CATHETERIZATION WITH CORONARY ANGIOGRAM;  Surgeon: Micheline Chapman, MD;  Location: [facility] CATH LAB;  Service: Cardiovascular;  Laterality: N/A;   left leg angiography     stenting of the left common iliac artery   left rotator cuff repair     right coronary artery stent     Past Medical History:  Diagnosis Date   CAD (coronary artery disease)    a. s/p 2 CoStar DES to RCA 06/2004. b.  LHC 12/09:  EF 65%, oCFX 40%, oRCA 30%, then 30-40% leading into stented seg that is patent, (10-20% ISR), 40% beyond stent,;  c. EF 81%, poor exercise capacity, hypotensive BP response, + chest pain, normal nuclear images (overall low risk)    CKD (chronic kidney disease)    Colon polyps    a. s/p removal 01/2012 - was told one might be cancerous but was not diagnosed with colon cancer - plan is for f/u colonoscopy in 5 yrs.   GERD (gastroesophageal reflux disease)    Hx of echocardiogram    a. Echo 06/2011: EF 55-60%, Gr 1 diast dysfn, mild MR   Insomnia    Mini  stroke    History of, in 2005 (before she was diagnosed with CAD).   Osteoporosis    Other and unspecified hyperlipidemia    Other isolated or specific phobias    Peripheral vascular disease, unspecified (HCC)    a. s/p L common femoral & left illiac stenting previously (patent by angio in 2007). - ABI 06/2011 normal. b. Carotid US 0-39% BICA 06/2011 (for f/u 2 yrs);   c.  ABIs 9/13:  normal (1.1 bilaterally)   Personal history of peptic ulcer disease    Remotely.   Psychiatric pseudoseizure    per patient and family   Tobacco use disorder    Type II or unspecified type diabetes mellitus without mention of complication, not stated as uncontrolled    Unspecified essential hypertension    REVIEW OF SYSTEMS:  Review of Systems  Constitutional:  Positive for fatigue (mild). Negative for appetite change, chills, diaphoresis, fever and unexpected weight change.  HENT:   Positive for hearing loss. Negative for lump/mass, mouth sores, nosebleeds, sore throat, tinnitus, trouble swallowing and voice change.   Eyes: Negative.  Negative for eye problems and icterus.  Respiratory: Negative.  Negative for chest tightness, cough, hemoptysis, shortness of breath and wheezing.   Cardiovascular: Negative.  Negative for chest pain, leg swelling and palpitations.  Gastrointestinal: Negative.  Negative for abdominal distention, abdominal pain, blood in stool, constipation, diarrhea, nausea, rectal pain and vomiting.  Endocrine: Negative.   Genitourinary: Negative.  Negative for bladder incontinence, difficulty urinating, dyspareunia, dysuria, frequency, hematuria, menstrual problem,  nocturia, pelvic pain, vaginal bleeding and vaginal discharge.   Musculoskeletal: Negative.  Negative for arthralgias, back pain, flank pain, gait problem, myalgias, neck pain and neck stiffness.       Knee pain  Skin: Negative.  Negative for itching, rash and wound.       Changes of her fingernails and toenails  Neurological: Negative.  Negative for dizziness, extremity weakness, gait problem, headaches, light-headedness, numbness, seizures and speech difficulty.  Hematological: Negative.  Negative for adenopathy. Does not bruise/bleed easily.  Psychiatric/Behavioral:  Positive for sleep disturbance. Negative for confusion, decreased concentration, depression and suicidal ideas. The patient is not nervous/anxious.    VITALS:  Blood pressure (!) 112/55, pulse (!) 119, temperature 97.8 F (36.6 C), temperature source Oral, resp. rate 20, height 5\' 1"  (1.549 m), weight 137 lb 1.6 oz (62.2 kg), SpO2 96 %.     Wt Readings from Last 3 Encounters:  06/24/22 140 lb (63.5 kg)  06/22/22 132 lb 1.3 oz (59.9 kg)  06/18/22 137 lb 1.6 oz (62.2 kg)    Body mass index is 25.9 kg/m.   Performance status (ECOG): 0 - Asymptomatic   PHYSICAL EXAM:  Physical Exam Vitals and nursing note reviewed. Exam conducted with a chaperone present.  Constitutional:      General: She is not in acute distress.    Appearance: Normal appearance. She is normal weight. She is not ill-appearing, toxic-appearing or diaphoretic.  HENT:     Head: Normocephalic and atraumatic.     Right Ear: Tympanic membrane, ear canal and external ear normal. There is no impacted cerumen.     Left Ear: Tympanic membrane, ear canal and external ear normal. There is no impacted cerumen.     Nose: Nose normal. No congestion or rhinorrhea.     Mouth/Throat:     Mouth: Mucous membranes are moist.     Pharynx: Oropharynx is clear. No oropharyngeal exudate or posterior oropharyngeal erythema.  Eyes:  General: No scleral icterus.        Right eye: No discharge.        Left eye: No discharge.     Extraocular Movements: Extraocular movements intact.     Conjunctiva/sclera: Conjunctivae normal.     Pupils: Pupils are equal, round, and reactive to light.  Neck:     Vascular: No carotid bruit.  Cardiovascular:     Rate and Rhythm: Normal rate and regular rhythm.     Pulses: Normal pulses.     Heart sounds: Normal heart sounds. No murmur heard.    No friction rub. No gallop.  Pulmonary:     Effort: Pulmonary effort is normal. No respiratory distress.     Breath sounds: Normal breath sounds. No stridor. No wheezing, rhonchi or rales.  Chest:     Chest wall: No tenderness.     Comments: Well healed scar in the upper inner quadrant of the right breast Hyperpigmentation and mild radiation changes that are mildly improving.  Port in the upper right chest Abdominal:     General: Bowel sounds are normal. There is no distension.     Palpations: Abdomen is soft. There is no hepatomegaly, splenomegaly or mass.     Tenderness: There is no abdominal tenderness. There is no right CVA tenderness, left CVA tenderness, guarding or rebound.     Hernia: No hernia is present.  Musculoskeletal:        General: No swelling, tenderness, deformity or signs of injury. Normal range of motion.     Cervical back: Normal range of motion and neck supple. No rigidity or tenderness.     Right lower leg: No edema.     Left lower leg: No edema.  Lymphadenopathy:     Cervical: No cervical adenopathy.     Right cervical: No superficial, deep or posterior cervical adenopathy.    Left cervical: No superficial, deep or posterior cervical adenopathy.     Upper Body:     Right upper body: No supraclavicular, axillary or pectoral adenopathy.     Left upper body: No supraclavicular, axillary or pectoral adenopathy.  Skin:    General: Skin is warm and dry.     Coloration: Skin is not jaundiced or pale.     Findings: No bruising, erythema, lesion or rash.      Comments: Mild decreased skin trugor  Neurological:     General: No focal deficit present.     Mental Status: She is alert and oriented to person, place, and time. Mental status is at baseline.     Cranial Nerves: No cranial nerve deficit.     Sensory: No sensory deficit.     Motor: No weakness.     Coordination: Coordination normal.     Gait: Gait normal.     Deep Tendon Reflexes: Reflexes normal.  Psychiatric:        Mood and Affect: Mood normal.        Behavior: Behavior normal.        Thought Content: Thought content normal.        Judgment: Judgment normal.    LABS:     CBC (12/11/22) (11/12/22) (10/22/22) (10/21/22) (10/20/22)  Hemoglobin 12.0 - 16.0 12.1 11.5 Low  R 9.6 Low  R 9.9 Low  R 10.4 Low  R  HCT 36 - 46 37 35.8 Low  R 30.3 Low  R 31.0 Low  R 32.1 Low  R  Neutrophils Absolute 6.97 4.6 R  2.8 R 2.9  R  Platelets 150 - 400 K/uL 251 192 124 Low  127 Low  137 Low   WBC 10.1 6.8 R 4.2 R 4.6 R 5.4 R     CMP (12/11/22) (11/12/22) (10/22/22) (10/21/22) (10/20/22) (10/05/22)  Glucose 136 124 High  R, CM 88 R, CM 87 R, CM 74 R, CM 110  BUN 4 - 21 29 Abnormal  16 R 10 R 16 R 20 R 19  CO2 13 - 22 25 Abnormal  25 R 24 R 24 R 24 R 24 Abnormal   Creatinine 0.5 - 1.1 1.3 Abnormal  1.13 High  R 0.73 R 0.81 R 0.84 R 1.0  Potassium 3.5 - 5.1 mEq/L 3.9 4.6 R 3.9 R 3.1 Low  R 3.5 R 3.2 Abnormal   Sodium 137 - 147 138 140 R 141 R 139 R 140 R 140  Chloride 99 - 108 106 106 R 109 R 105 R 107 R 106     Component Ref Range & Units 13 d ago (12/11/22) 1 mo ago (11/12/22) 2 mo ago (10/22/22) 2 mo ago (10/21/22) 2 mo ago (10/20/22) 2 mo ago (10/19/22) 2 mo ago (10/05/22)  Calcium 8.7 - 10.7 9.0 9.2 R 8.8 Low  R 8.4 Low  R 8.6 Low  R 8.3 Low  R 9.2  Albumin 3.5 - 5.0 4.0 3.9 R  3.0 Low  R 3.0 Low  R 3.3 Low  R 3.9     Component Ref Range & Units 12/03/2022  Magnesium 1.9 - 2.7 mg/dL 1.9    STUDIES:  EXAM: 10/19/2022 CT ABDOMEN AND PELVIS WITH CONTRAST IMPRESSION: 1. No CT evidence  for acute intra-abdominal or pelvic abnormality. 2. Hepatic steatosis. 3. Aortic atherosclerosis.(ICD10-I70.0).   EXAM:05/05/22 NUCLEAR MEDICINE WHOLE BODY BONE SCAN IMPRESSION: Uptake at LEFT tibial metaphysis especially medially, nonspecific; radiographic correlation recommended to exclude metastatic focus.   Focal abnormal tracer uptake at the LEFT temporo-occipital calvarium, corresponding to and focus of abnormal marrow signal on prior MR head exams back to 2010, nonspecific but likely benign.   No additional scintigraphic findings to suggest osseous metastatic disease.   Diffuse calvarial uptake, favoring metabolic bone disease.    EXAM:05/05/22 CT CHEST WITH CONTRAST IMPRESSION: Ground glass nodule in the right upper lobe with a possible developing internal solid component. Lesion has shown indolent growth from 11/11/2017, at which time it measured approcimately 1.2 x 1.8 cm. Findings are worrisome for adenocarcinoma.  No definitive evidence of metastatic breast cancer. Steatotic cirrhotic liver     Allergies  Allergen Reactions   Amoxicillin Hives and Nausea And Vomiting   Claritin [Loratadine] Other (See Comments)    Pt's daughter notified us of this allergy.   Insulins Nausea And Vomiting and Other (See Comments)    Patient unsure of name of insulin, will call us back   Vioxx [Rofecoxib] Hives   Celecoxib Hives and Nausea And Vomiting   Liraglutide Nausea And Vomiting   Propranolol Diarrhea and Nausea And Vomiting   Current Outpatient Medications on File Prior to Visit  Medication Sig Dispense Refill   PROLIA 60 MG/ML SOSY injection Inject into the skin.     aspirin EC 81 MG tablet Take 1 tablet (81 mg total) by mouth daily. 30 tablet 11   busPIRone (BUSPAR) 7.5 MG tablet Take 1 tablet by mouth in the morning, at noon, and at bedtime.     clopidogrel (PLAVIX) 75 MG tablet Take 1 tablet (75 mg total) by mouth daily.  divalproex (DEPAKOTE) 250 MG DR tablet Take 250 mg  by mouth 2 (two) times daily.     donepezil (ARICEPT) 5 MG tablet Take 5 mg by mouth daily.     DULoxetine (CYMBALTA) 60 MG capsule Take 120 mg by mouth at bedtime.      famotidine (PEPCID) 20 MG tablet Take 20 mg by mouth 2 (two) times daily.     fenofibrate (TRICOR) 145 MG tablet Take 145 mg by mouth daily.     fexofenadine (ALLEGRA) 180 MG tablet Take 180 mg by mouth daily.     fluconazole (DIFLUCAN) 100 MG tablet Take 1 tablet (100 mg total) by mouth daily. 10 tablet 0   gabapentin (NEURONTIN) 600 MG tablet Take 600 mg by mouth 3 (three) times daily.     hydrALAZINE (APRESOLINE) 50 MG tablet Take 1 tablet (50 mg total) by mouth 3 (three) times daily.     HYDROcodone-acetaminophen (NORCO) 5-325 MG tablet Take 1 tablet by mouth every 4 (four) hours as needed for moderate pain. 30 tablet 0   JARDIANCE 25 MG TABS tablet Take 25 mg by mouth daily.     LORazepam (ATIVAN) 0.5 MG tablet Take 1 tablet (0.5 mg total) by mouth at bedtime as needed for sleep. May take one tab prior to imaging studies due to anxiety 30 tablet 0   metoprolol tartrate (LOPRESSOR) 25 MG tablet Take 25 mg by mouth 2 (two) times daily.     nicotine (NICODERM CQ - DOSED IN MG/24 HOURS) 21 mg/24hr patch Place 1 patch (21 mg total) onto the skin daily. 28 patch 0   nitroGLYCERIN (NITROSTAT) 0.4 MG SL tablet Place 0.4 mg under the tongue every 5 (five) minutes as needed. For chest pain     ondansetron (ZOFRAN-ODT) 4 MG disintegrating tablet Take 4 mg by mouth every 6 (six) hours as needed.     pantoprazole (PROTONIX) 40 MG tablet Take 40 mg by mouth 2 (two) times daily.     potassium chloride (KLOR-CON M) 10 MEQ tablet Take 1 tablet (10 mEq total) by mouth 3 (three) times daily. 90 tablet 5   TRESIBA FLEXTOUCH 100 UNIT/ML FlexTouch Pen Inject into the skin.     valsartan-hydrochlorothiazide (DIOVAN-HCT) 320-25 MG tablet TAKE 1 TABLET BY ORAL ROUTE ONCE DAILY FOR HIGH BLOOD PRESSURE     No current facility-administered medications  on file prior to visit.     I,Jasmine M Lassiter,acting as a scribe for Dellia Beckwith, MD.,have documented all relevant documentation on the behalf of Dellia Beckwith, MD,as directed by  Dellia Beckwith, MD while in the presence of Dellia Beckwith, MD.

## 2022-12-25 ENCOUNTER — Encounter: Payer: Self-pay | Admitting: Oncology

## 2022-12-25 ENCOUNTER — Inpatient Hospital Stay: Payer: 59

## 2022-12-25 ENCOUNTER — Inpatient Hospital Stay (INDEPENDENT_AMBULATORY_CARE_PROVIDER_SITE_OTHER): Payer: 59 | Admitting: Oncology

## 2022-12-25 VITALS — BP 132/62 | HR 71 | Temp 97.8°F | Resp 18 | Ht 61.0 in | Wt 128.9 lb

## 2022-12-25 DIAGNOSIS — Z171 Estrogen receptor negative status [ER-]: Secondary | ICD-10-CM | POA: Diagnosis not present

## 2022-12-25 DIAGNOSIS — M810 Age-related osteoporosis without current pathological fracture: Secondary | ICD-10-CM

## 2022-12-25 DIAGNOSIS — C50412 Malignant neoplasm of upper-outer quadrant of left female breast: Secondary | ICD-10-CM | POA: Diagnosis not present

## 2022-12-25 DIAGNOSIS — D649 Anemia, unspecified: Secondary | ICD-10-CM | POA: Diagnosis not present

## 2022-12-25 LAB — CBC AND DIFFERENTIAL
HCT: 37 (ref 36–46)
Hemoglobin: 12 (ref 12.0–16.0)
Neutrophils Absolute: 5.09
Platelets: 184 10*3/uL (ref 150–400)
WBC: 7.6

## 2022-12-25 LAB — CBC: RBC: 3.86 — AB (ref 3.87–5.11)

## 2022-12-25 LAB — HEPATIC FUNCTION PANEL
ALT: 9 U/L (ref 7–35)
AST: 23 (ref 13–35)
Alkaline Phosphatase: 94 (ref 25–125)
Bilirubin, Total: 0.3

## 2022-12-25 LAB — BASIC METABOLIC PANEL
BUN: 18 (ref 4–21)
CO2: 25 — AB (ref 13–22)
Chloride: 107 (ref 99–108)
Creatinine: 1 (ref 0.5–1.1)
EGFR: 55
Glucose: 175
Potassium: 4.1 mEq/L (ref 3.5–5.1)
Sodium: 143 (ref 137–147)

## 2022-12-25 LAB — COMPREHENSIVE METABOLIC PANEL
Albumin: 3.9 (ref 3.5–5.0)
Calcium: 9.1 (ref 8.7–10.7)

## 2022-12-27 DIAGNOSIS — G620 Drug-induced polyneuropathy: Secondary | ICD-10-CM | POA: Diagnosis not present

## 2022-12-27 DIAGNOSIS — J449 Chronic obstructive pulmonary disease, unspecified: Secondary | ICD-10-CM | POA: Diagnosis not present

## 2022-12-27 DIAGNOSIS — C50412 Malignant neoplasm of upper-outer quadrant of left female breast: Secondary | ICD-10-CM | POA: Diagnosis not present

## 2023-01-06 DIAGNOSIS — Z794 Long term (current) use of insulin: Secondary | ICD-10-CM | POA: Diagnosis not present

## 2023-01-06 DIAGNOSIS — E119 Type 2 diabetes mellitus without complications: Secondary | ICD-10-CM | POA: Diagnosis not present

## 2023-01-06 DIAGNOSIS — Z7984 Long term (current) use of oral hypoglycemic drugs: Secondary | ICD-10-CM | POA: Diagnosis not present

## 2023-01-06 DIAGNOSIS — Z9849 Cataract extraction status, unspecified eye: Secondary | ICD-10-CM | POA: Diagnosis not present

## 2023-01-06 DIAGNOSIS — Z961 Presence of intraocular lens: Secondary | ICD-10-CM | POA: Diagnosis not present

## 2023-01-06 NOTE — Progress Notes (Incomplete)
[facility]  [address] [place],  [place]  [address] [phone]   Clinic Day: 12/11/22  Referring physician: [name], MD   ASSESSMENT & PLAN:  Stage IA Left breast cancer This is a grade 3 triple negative 21 mm tumor for a T1cN0M0 with a Ki-67 of 40%. She has had resection with clear margins and negative sentinel node. However, I recommended adjuvant chemotherapy with docetaxol and cyclophosphamide every 3 weeks for 4 cycles to avoid anthracyclines.  She completed this 07/13/22. She has now completed adjuvant radiation in February, 2024.   Neuropathy She complains of numbness and pain of both legs and she feels unsteady. Some of this was present even prior to chemotherapy but did worsen with her treatment.    Family history of breast cancer and BRCA positivity Yet she is BRCA negative.   Lung nodule This is a ground glass nodule of the right upper lobe which has shown indolent growth from 1.8 cm in April of 2019 to 2.2 cm currently. We will plan to monitor this with repeat imaging periodically.   Liver cirrhosis Liver is decreased in attenuation diffusely with mildly irregular margins. I don't think she was aware of this diagnosis but it may explain her increased toxicities.   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 She has these fairly regularly.    Memory impairment This is consistent with early dementia. She also has significant hearing impairment.   Hypokalemia CMP reveals a potassium of 3.6, up from 3.0 and so I will order a small amount of  IV potassium to get this corrected. She will continue potassium to TID.   Plan:  Patient informed me that she is supposed to schedule a appointment with a nerve doctor and see her cardiologist to discuss the mild stenosis of the left carotid artery. She informed me that she would like  her port removed and I will contact Dr. Georgiana Shore about that. Her WBC is at 7.6, hemoglobin at 12.0, and platelet count at 184,000 today. Her CMP is normal otherwise a slightly elevated glucose of 175. Her Bun came down from 29 to 18 with a normal creatinine. I will see her back in 2 weeks with CBC and CMP. The patient agreed with the plan.  The patient was advised to call back if the symptoms worsen or if the condition fails to improve as anticipated.   I provided 20 minutes of face-to-face time during this this encounter and > 50% was spent counseling as documented under my assessment and plan.    Dellia Beckwith, MD [facility] [address] [place] [address] Dept: [phone] Dept Fax: [phone]    CHIEF COMPLAINT:  CC: Invasive ductal carcinoma, triple negative   Current Treatment: Surveillance and supportive care  HISTORY OF PRESENT ILLNESS:       Oncology History  Breast cancer of upper-outer quadrant of left female breast (HCC)  03/18/2022 Initial Diagnosis    Breast cancer of upper-outer quadrant of left female breast (HCC)    04/28/2022 Cancer Staging    Staging form: Breast, AJCC 8th Edition - Clinical stage from 04/28/2022: Stage IB (cT1c, cN0(sn), cM0, G3, ER-, PR-, HER2-) - Signed by Dellia Beckwith, MD on 04/28/2022 Histopathologic type: Infiltrating duct carcinoma, NOS Stage prefix: Initial diagnosis Method of lymph node assessment: Sentinel lymph node biopsy Nuclear  grade: G3 Multigene prognostic tests performed: None Histologic grading system: 3 grade system Laterality: Left Tumor size (mm): 20 Lymph-vascular invasion (LVI): LVI not present (absent)/not identified Diagnostic confirmation: Positive histology Specimen type: Excision Staged by: Managing physician Menopausal status: Postmenopausal Ki-67 (%): 40 Stage used in treatment planning: Yes National guidelines used in treatment  planning: Yes Type of national guideline used in treatment planning: NCCN    05/07/2022 -  Chemotherapy    Patient is on Treatment Plan : BREAST TC q21d     05/19/2022 Genetic Testing    Negative hereditary cancer genetic testing: no pathogenic variant detected in [facility] Common Hereditary Cancers +RNA Panel.  Report date is May 19, 2022.     The Common Hereditary Cancers + RNA Panel offered by [facility] includes sequencing, deletion/duplication, and RNA testing of the following 47 genes: APC, ATM, AXIN2, BARD1, BMPR1A, BRCA1, BRCA2, BRIP1, CDH1, CDK4*, CDKN2A (p14ARF)*, CDKN2A (p16INK4a)*, CHEK2, CTNNA1, DICER1, EPCAM (Deletion/duplication testing only), GREM1 (promoter region deletion/duplication testing only), KIT, MEN1, MLH1, MSH2, MSH3, MSH6, MUTYH, NBN, NF1, NHTL1, PALB2, PDGFRA*, PMS2, POLD1, POLE, PTEN, RAD50, RAD51C, RAD51D, SDHB, SDHC, SDHD, SMAD4, SMARCA4. STK11, TP53, TSC1, TSC2, and VHL.  The following genes were evaluated for sequence changes only: SDHA and HOXB13 c.251G>A variant only.  RNA analysis is not performed for the * genes.      Falonda is a 72 year old woman who had a screening mammogram on 01/27/2022 and was found to have a possible left breast mass. She does get yearly mammograms due to her strong family history. She thought  she could feel a lump after this was found. She had a diagnostic mammogram on 02/11/2022 with a persistent spiculated hyperdense mass with associated calcifications in the upper inner quadrant. Ultrasound confirmed an irregular hypoechoic mass with peripheral vascularity at 11:30, 3 cm from the nipple. This measures 2.2 cm and the left axilla was negative. She was referred to [name] after a biopsy was done on 02/17/2022, which confirmed a grade 3 invasive ductal carcinoma which is triple negative with a Ki-67 of 40%. [name] took her to surgery on 03/18/2022 for lumpectomy and sentinel lymph node. The final pathology revealed a 20 mm grade 3 invasive  ductal carcinoma with DCIS. The margins were clear and the lymph node was negative for a T1cN0M0, stage IA. She has had a port placed.   INTERVAL HISTORY:  Lindsay Brooks is seen here today for invasive ductal carcinoma for continued supportive care after completing chemotherapy in December, 2023 and completing radiation in February, 2024. Patient states that is *** and ***.    She denies signs of infection such as sore throat, sinus drainage, cough, or urinary symptoms.  She denies fevers or recurrent chills. She denies pain. She denies nausea, vomiting, chest pain, dyspnea or cough. Her appetite is *** and her weight {Weight change:10426}.   feels ok and has no complaints of pain but states that her balance is off and she walks to the left more. She informed me that she is supposed to schedule a appointment with a nerve doctor and see her cardiologist to discuss the mild stenosis of the left carotid artery. She informed me that she would like her port removed and I will contact Dr. Georgiana Shore about that. Her WBC is at 7.6, hemoglobin at 12.0, and platelet count at 184,000 today. Her CMP is normal otherwise a slightly elevated glucose of 175. Her Bun came down from 29 to 18 with a normal creatinine. I will see her back  in 2 weeks with CBC and CMP.     HISTORY:  SOCIAL HISTORY She lives with her first daughter. She has 2 daughters. She smokes 1.5 packs a day, and has been smoking since 72 yo. She is not sure about quitting. She did smoke 2 packs per day for over 50 years. She doesn't drink other than rare occasion. She is widowed. She has been married 4 times. She is disabled.   Menstrual History 72 years old when she had her first child She has had 3 children 86 years old when starting menstrual period Menopause started at 14 She has had a partial hysterectomy in her 33's and went back for complete hysterectomy/BSO later. No hormone replacement therapy   Family History  Problem Relation Age of Onset    Heart attack Mother    Allergies Mother    Heart attack Father    Allergies Father    Kidney disease Father    Hypertension Father    Melanoma Sister    Coronary artery disease Brother    Breast cancer Maternal Aunt    Breast cancer Maternal Aunt    Breast cancer Paternal Aunt        d. under 103   Pancreatic cancer Maternal Grandmother    Breast cancer Daughter        BRCA1+  Her sister had a brain tumor at age 14    Past Surgical History:  Procedure Laterality Date   APPENDECTOMY     BREAST BIOPSY     CARDIAC CATHETERIZATION  2009   patent RCA stent. mild CAD otherwise.    CATARACT EXTRACTION, BILATERAL  2016   CHOLECYSTECTOMY  2009   FLEXIBLE SIGMOIDOSCOPY N/A 10/21/2022   Procedure: FLEXIBLE SIGMOIDOSCOPY;  Surgeon: Charna Elizabeth, MD;  Location: [facility];  Service: Gastroenterology;  Laterality: N/A;   hysterectomy (other)     age 19   KNEE ARTHROSCOPY Left    left arm surgery     LEFT HEART CATHETERIZATION WITH CORONARY ANGIOGRAM N/A 01/16/2014   Procedure: LEFT HEART CATHETERIZATION WITH CORONARY ANGIOGRAM;  Surgeon: Micheline Chapman, MD;  Location: [facility] CATH LAB;  Service: Cardiovascular;  Laterality: N/A;   left leg angiography     stenting of the left common iliac artery   left rotator cuff repair     right coronary artery stent     Past Medical History:  Diagnosis Date   CAD (coronary artery disease)    a. s/p 2 CoStar DES to RCA 06/2004. b.  LHC 12/09:  EF 65%, oCFX 40%, oRCA 30%, then 30-40% leading into stented seg that is patent, (10-20% ISR), 40% beyond stent,;  c. EF 81%, poor exercise capacity, hypotensive BP response, + chest pain, normal nuclear images (overall low risk)   CKD (chronic kidney disease)    Colon polyps    a. s/p removal 01/2012 - was told one might be cancerous but was not diagnosed with colon cancer - plan is for f/u colonoscopy in 5 yrs.   GERD (gastroesophageal reflux disease)    Hx of echocardiogram    a. Echo 06/2011: EF 55-60%,  Gr 1 diast dysfn, mild MR   Insomnia    Mini stroke    History of, in 2005 (before she was diagnosed with CAD).   Osteoporosis    Other and unspecified hyperlipidemia    Other isolated or specific phobias    Peripheral vascular disease, unspecified (HCC)    a. s/p L common femoral & left illiac stenting  previously (patent by angio in 2007). - ABI 06/2011 normal. b. Carotid US 0-39% BICA 06/2011 (for f/u 2 yrs);   c.  ABIs 9/13:  normal (1.1 bilaterally)   Personal history of peptic ulcer disease    Remotely.   Psychiatric pseudoseizure    per patient and family   Tobacco use disorder    Type II or unspecified type diabetes mellitus without mention of complication, not stated as uncontrolled    Unspecified essential hypertension    REVIEW OF SYSTEMS:  Review of Systems  Constitutional:  Positive for fatigue (mild). Negative for appetite change, chills, diaphoresis, fever and unexpected weight change.  HENT:   Positive for hearing loss. Negative for lump/mass, mouth sores, nosebleeds, sore throat, tinnitus, trouble swallowing and voice change.   Eyes: Negative.  Negative for eye problems and icterus.  Respiratory: Negative.  Negative for chest tightness, cough, hemoptysis, shortness of breath and wheezing.   Cardiovascular:  Negative for chest pain, leg swelling and palpitations.  Gastrointestinal:  Negative for abdominal distention, abdominal pain, blood in stool, constipation, diarrhea, nausea, rectal pain and vomiting.  Endocrine: Negative.   Genitourinary: Negative.  Negative for bladder incontinence, difficulty urinating, dyspareunia, dysuria, frequency, hematuria, menstrual problem, nocturia, pelvic pain, vaginal bleeding and vaginal discharge.   Musculoskeletal:  Negative for arthralgias, back pain, flank pain, gait problem, myalgias, neck pain and neck stiffness.       Knee pain  Skin: Negative.  Negative for itching, rash and wound.       Changes of her fingernails and toenails     Neurological:  Negative for dizziness, extremity weakness, gait problem, headaches, numbness, seizures and speech difficulty.  Hematological:  Does not bruise/bleed easily.  Psychiatric/Behavioral:  Positive for sleep disturbance. Negative for confusion, decreased concentration, depression and suicidal ideas. The patient is not nervous/anxious.    VITALS:  Blood pressure (!) 112/55, pulse (!) 119, temperature 97.8 F (36.6 C), temperature source Oral, resp. rate 20, height 5\' 1"  (1.549 m), weight 137 lb 1.6 oz (62.2 kg), SpO2 96 %.     Wt Readings from Last 3 Encounters:  06/24/22 140 lb (63.5 kg)  06/22/22 132 lb 1.3 oz (59.9 kg)  06/18/22 137 lb 1.6 oz (62.2 kg)    Body mass index is 25.9 kg/m.   Performance status (ECOG): 0 - Asymptomatic   PHYSICAL EXAM:  Physical Exam Vitals and nursing note reviewed. Exam conducted with a chaperone present.  Constitutional:      General: She is not in acute distress.    Appearance: Normal appearance. She is normal weight. She is not ill-appearing, toxic-appearing or diaphoretic.  HENT:     Head: Normocephalic and atraumatic.     Right Ear: Tympanic membrane, ear canal and external ear normal. There is no impacted cerumen.     Left Ear: Tympanic membrane, ear canal and external ear normal. There is no impacted cerumen.     Nose: Nose normal. No congestion or rhinorrhea.     Mouth/Throat:     Mouth: Mucous membranes are moist.     Pharynx: Oropharynx is clear. No oropharyngeal exudate or posterior oropharyngeal erythema.     Comments: Thrush Eyes:     General: No scleral icterus.       Right eye: No discharge.        Left eye: No discharge.     Extraocular Movements: Extraocular movements intact.     Conjunctiva/sclera: Conjunctivae normal.     Pupils: Pupils are equal, round, and reactive  to light.  Neck:     Vascular: No carotid bruit.  Cardiovascular:     Rate and Rhythm: Normal rate and regular rhythm.     Pulses: Normal  pulses.     Heart sounds: Normal heart sounds. No murmur heard.    No friction rub. No gallop.  Pulmonary:     Effort: Pulmonary effort is normal. No respiratory distress.     Breath sounds: Normal breath sounds. No stridor. No wheezing, rhonchi or rales.  Chest:     Chest wall: No tenderness.     Comments: Hyperpigmentation of the left breast with post radiation changes which are healing.    Abdominal:     General: Bowel sounds are normal. There is no distension.     Palpations: Abdomen is soft. There is no hepatomegaly, splenomegaly or mass.     Tenderness: There is no abdominal tenderness. There is no right CVA tenderness, left CVA tenderness, guarding or rebound.     Hernia: No hernia is present.  Musculoskeletal:        General: No swelling, tenderness, deformity or signs of injury. Normal range of motion.     Cervical back: Normal range of motion and neck supple. No rigidity or tenderness.     Right lower leg: No edema.     Left lower leg: No edema.  Lymphadenopathy:     Cervical: No cervical adenopathy.     Right cervical: No superficial, deep or posterior cervical adenopathy.    Left cervical: No superficial, deep or posterior cervical adenopathy.     Upper Body:     Right upper body: No supraclavicular, axillary or pectoral adenopathy.     Left upper body: No supraclavicular, axillary or pectoral adenopathy.  Skin:    General: Skin is warm and dry.     Coloration: Skin is not jaundiced or pale.     Findings: No bruising, erythema, lesion or rash.     Comments: Decreased trugor  Neurological:     General: No focal deficit present.     Mental Status: She is alert and oriented to person, place, and time. Mental status is at baseline.     Cranial Nerves: No cranial nerve deficit.     Sensory: No sensory deficit.     Motor: No weakness.     Coordination: Coordination normal.     Gait: Gait normal.     Deep Tendon Reflexes: Reflexes normal.  Psychiatric:        Mood and  Affect: Mood normal.        Behavior: Behavior normal.        Thought Content: Thought content normal.        Judgment: Judgment normal.    LABS:    Component Ref Range & Units 12 d ago (12/25/22) 3 wk ago (12/11/22) 1 mo ago (11/12/22) 2 mo ago (10/22/22) 2 mo ago (10/21/22) 2 mo ago (10/20/22) 2 mo ago (10/20/22)  Hemoglobin 12.0 - 16.0 12.0 12.1 11.5 Low  R 9.6 Low  R 9.9 Low  R 10.4 Low  R 10.4 Low  R  HCT 36 - 46 37 37 35.8 Low  R 30.3 Low  R 31.0 Low  R 32.1 Low  R 32.5 Low  R, CM  Neutrophils Absolute 5.09 6.97 4.6 R  2.8 R 2.9 R   Platelets 150 - 400 K/uL 184 251 192 124 Low  127 Low  137 Low    WBC 7.6 10.1 6.8 R 4.2 R  4.6 R 5.4 R    CMP (12/25/2022) (12/11/2022) 4 wk ago (11/12/22) 1 mo ago (10/22/22) 1 mo ago (10/21/22) 1 mo ago (10/20/22) 1 mo ago (10/19/22) 2 mo ago (10/05/22)  Sodium 135 - 145 mmol/L 143 138 140 141 139 140 138 140 R  Potassium 3.5 - 5.1 mmol/L 4.1 3.9 4.6 3.9 3.1 Low  3.5 3.5 3.2 Abnormal  R  Chloride 98 - 111 mmol/L 107 106 106 109 105 107 107 106 R  CO2 22 - 32 mmol/L 25 25 25 24 24 24 22 24  Abnormal  R  Glucose, Bld 70 - 99 mg/dL 161 096 045 High  88 CM 87 CM 74 CM 91 CM 110 R  BUN 8 - 23 mg/dL 18 29 16 10 16 20 20 19  R  Creatinine 0.44 - 1.00 mg/dL 1.0 1.3 4.09 High  8.11 0.81 0.84 0.98 1.0 R  Calcium 8.9 - 10.3 mg/dL 9.1 9.0 9.2 8.8 Low  8.4 Low  8.6 Low  8.3 Low    Total Protein 6.5 - 8.1 g/dL   7.0  5.5 Low  5.6 Low  6.2 Low    Albumin 3.5 - 5.0 g/dL 3.9 4.0 3.9  3.0 Low  3.0 Low  3.3 Low    AST 15 - 41 U/L 23 94  21  19 17 18    ALT 0 - 44 U/L 9 11 16  11 11 9    Alkaline Phosphatase 38 - 126 U/L 94 99 72  62 63 63    Component Ref Range & Units (12/03/2022) 2 wk ago (10/22/22) 3 wk ago (10/21/22) 3 wk ago (10/20/22) 3 wk ago (10/19/22)  Magnesium 1.7 - 2.4 mg/dL 1.9 2.0 1.8 CM 1.7 CM 1.8 CM    STUDIES:  EXAM: 10/19/2022 CT ABDOMEN AND PELVIS WITH CONTRAST IMPRESSION: 1. No CT evidence for acute intra-abdominal or pelvic  abnormality. 2. Hepatic steatosis. 3. Aortic atherosclerosis.(ICD10-I70.0).     Allergies  Allergen Reactions   Amoxicillin Hives and Nausea And Vomiting   Claritin [Loratadine] Other (See Comments)    Pt's daughter notified us of this allergy.   Insulins Nausea And Vomiting and Other (See Comments)    Patient unsure of name of insulin, will call us back   Vioxx [Rofecoxib] Hives   Celecoxib Hives and Nausea And Vomiting   Liraglutide Nausea And Vomiting   Propranolol Diarrhea and Nausea And Vomiting   Current Outpatient Medications on File Prior to Visit  Medication Sig Dispense Refill   aspirin EC 81 MG tablet Take 1 tablet (81 mg total) by mouth daily. 30 tablet 11   busPIRone (BUSPAR) 7.5 MG tablet Take 1 tablet by mouth in the morning, at noon, and at bedtime.     clopidogrel (PLAVIX) 75 MG tablet Take 1 tablet (75 mg total) by mouth daily.     divalproex (DEPAKOTE) 250 MG DR tablet Take 250 mg by mouth 2 (two) times daily.     donepezil (ARICEPT) 5 MG tablet Take 5 mg by mouth daily.     DULoxetine (CYMBALTA) 60 MG capsule Take 120 mg by mouth at bedtime.      famotidine (PEPCID) 20 MG tablet Take 20 mg by mouth 2 (two) times daily.     fenofibrate (TRICOR) 145 MG tablet Take 145 mg by mouth daily.     fexofenadine (ALLEGRA) 180 MG tablet Take 180 mg by mouth daily.     fluconazole (DIFLUCAN) 100 MG tablet Take 1 tablet (100 mg total) by  mouth daily. 10 tablet 0   gabapentin (NEURONTIN) 600 MG tablet Take 600 mg by mouth 3 (three) times daily.     hydrALAZINE (APRESOLINE) 50 MG tablet Take 1 tablet (50 mg total) by mouth 3 (three) times daily.     HYDROcodone-acetaminophen (NORCO) 5-325 MG tablet Take 1 tablet by mouth every 4 (four) hours as needed for moderate pain. 30 tablet 0   JARDIANCE 25 MG TABS tablet Take 25 mg by mouth daily.     LORazepam (ATIVAN) 0.5 MG tablet Take 1 tablet (0.5 mg total) by mouth at bedtime as needed for sleep. May take one tab prior to imaging  studies due to anxiety 30 tablet 0   metoprolol tartrate (LOPRESSOR) 25 MG tablet Take 25 mg by mouth 2 (two) times daily.     nicotine (NICODERM CQ - DOSED IN MG/24 HOURS) 21 mg/24hr patch Place 1 patch (21 mg total) onto the skin daily. 28 patch 0   nitroGLYCERIN (NITROSTAT) 0.4 MG SL tablet Place 0.4 mg under the tongue every 5 (five) minutes as needed. For chest pain     ondansetron (ZOFRAN-ODT) 4 MG disintegrating tablet Take 4 mg by mouth every 6 (six) hours as needed.     pantoprazole (PROTONIX) 40 MG tablet Take 40 mg by mouth 2 (two) times daily.     potassium chloride (KLOR-CON M) 10 MEQ tablet Take 1 tablet (10 mEq total) by mouth 3 (three) times daily. 90 tablet 5   PROLIA 60 MG/ML SOSY injection Inject into the skin.     TRESIBA FLEXTOUCH 100 UNIT/ML FlexTouch Pen Inject into the skin.     valsartan-hydrochlorothiazide (DIOVAN-HCT) 320-25 MG tablet TAKE 1 TABLET BY ORAL ROUTE ONCE DAILY FOR HIGH BLOOD PRESSURE     No current facility-administered medications on file prior to visit.     I,Jasmine M Lassiter,acting as a scribe for Dellia Beckwith, MD.,have documented all relevant documentation on the behalf of Dellia Beckwith, MD,as directed by  Dellia Beckwith, MD while in the presence of Dellia Beckwith, MD.

## 2023-01-07 DIAGNOSIS — M810 Age-related osteoporosis without current pathological fracture: Secondary | ICD-10-CM | POA: Diagnosis not present

## 2023-01-08 ENCOUNTER — Ambulatory Visit: Payer: 59 | Admitting: Oncology

## 2023-01-08 ENCOUNTER — Other Ambulatory Visit: Payer: 59

## 2023-01-10 ENCOUNTER — Encounter: Payer: Self-pay | Admitting: Oncology

## 2023-01-10 DIAGNOSIS — M810 Age-related osteoporosis without current pathological fracture: Secondary | ICD-10-CM | POA: Insufficient documentation

## 2023-01-12 ENCOUNTER — Other Ambulatory Visit: Payer: Self-pay | Admitting: Oncology

## 2023-01-12 ENCOUNTER — Encounter: Payer: Self-pay | Admitting: Oncology

## 2023-01-12 ENCOUNTER — Inpatient Hospital Stay: Payer: Medicare Other | Attending: Hematology and Oncology

## 2023-01-12 ENCOUNTER — Inpatient Hospital Stay (INDEPENDENT_AMBULATORY_CARE_PROVIDER_SITE_OTHER): Payer: Medicare Other | Admitting: Oncology

## 2023-01-12 VITALS — BP 143/62 | HR 72 | Temp 98.2°F | Resp 18 | Ht 61.0 in | Wt 131.4 lb

## 2023-01-12 DIAGNOSIS — Z171 Estrogen receptor negative status [ER-]: Secondary | ICD-10-CM

## 2023-01-12 DIAGNOSIS — G629 Polyneuropathy, unspecified: Secondary | ICD-10-CM | POA: Diagnosis not present

## 2023-01-12 DIAGNOSIS — T451X5A Adverse effect of antineoplastic and immunosuppressive drugs, initial encounter: Secondary | ICD-10-CM

## 2023-01-12 DIAGNOSIS — R911 Solitary pulmonary nodule: Secondary | ICD-10-CM

## 2023-01-12 DIAGNOSIS — C50412 Malignant neoplasm of upper-outer quadrant of left female breast: Secondary | ICD-10-CM | POA: Diagnosis not present

## 2023-01-12 DIAGNOSIS — Z853 Personal history of malignant neoplasm of breast: Secondary | ICD-10-CM | POA: Diagnosis present

## 2023-01-12 DIAGNOSIS — Z9221 Personal history of antineoplastic chemotherapy: Secondary | ICD-10-CM | POA: Insufficient documentation

## 2023-01-12 DIAGNOSIS — M810 Age-related osteoporosis without current pathological fracture: Secondary | ICD-10-CM | POA: Diagnosis not present

## 2023-01-12 DIAGNOSIS — E876 Hypokalemia: Secondary | ICD-10-CM | POA: Insufficient documentation

## 2023-01-12 DIAGNOSIS — K7460 Unspecified cirrhosis of liver: Secondary | ICD-10-CM | POA: Insufficient documentation

## 2023-01-12 DIAGNOSIS — G620 Drug-induced polyneuropathy: Secondary | ICD-10-CM

## 2023-01-12 DIAGNOSIS — Z803 Family history of malignant neoplasm of breast: Secondary | ICD-10-CM | POA: Insufficient documentation

## 2023-01-12 DIAGNOSIS — Z923 Personal history of irradiation: Secondary | ICD-10-CM | POA: Diagnosis not present

## 2023-01-12 LAB — CMP (CANCER CENTER ONLY)
ALT: 9 U/L (ref 0–44)
AST: 14 U/L — ABNORMAL LOW (ref 15–41)
Albumin: 3.5 g/dL (ref 3.5–5.0)
Alkaline Phosphatase: 77 U/L (ref 38–126)
Anion gap: 10 (ref 5–15)
BUN: 18 mg/dL (ref 8–23)
CO2: 22 mmol/L (ref 22–32)
Calcium: 6.7 mg/dL — ABNORMAL LOW (ref 8.9–10.3)
Chloride: 110 mmol/L (ref 98–111)
Creatinine: 0.98 mg/dL (ref 0.44–1.00)
GFR, Estimated: >60 mL/min (ref >60)
Glucose, Bld: 123 mg/dL — ABNORMAL HIGH (ref 70–99)
Potassium: 4.1 mmol/L (ref 3.5–5.1)
Sodium: 142 mmol/L (ref 135–145)
Total Bilirubin: 0.5 mg/dL (ref 0.3–1.2)
Total Protein: 7 g/dL (ref 6.5–8.1)

## 2023-01-12 LAB — CBC WITH DIFFERENTIAL (CANCER CENTER ONLY)
Abs Immature Granulocytes: 0.01 10*3/uL (ref 0.00–0.07)
Basophils Absolute: 0 10*3/uL (ref 0.0–0.1)
Basophils Relative: 1 %
Eosinophils Absolute: 0.1 10*3/uL (ref 0.0–0.5)
Eosinophils Relative: 1 %
HCT: 37.5 % (ref 36.0–46.0)
Hemoglobin: 11.3 g/dL — ABNORMAL LOW (ref 12.0–15.0)
Immature Granulocytes: 0 %
Lymphocytes Relative: 18 %
Lymphs Abs: 1.6 10*3/uL (ref 0.7–4.0)
MCH: 29.6 pg (ref 26.0–34.0)
MCHC: 30.1 g/dL (ref 30.0–36.0)
MCV: 98.2 fL (ref 80.0–100.0)
Monocytes Absolute: 0.7 10*3/uL (ref 0.1–1.0)
Monocytes Relative: 8 %
Neutro Abs: 6.3 10*3/uL (ref 1.7–7.7)
Neutrophils Relative %: 72 %
Platelet Count: 230 10*3/uL (ref 150–400)
RBC: 3.82 MIL/uL — ABNORMAL LOW (ref 3.87–5.11)
RDW: 14.4 % (ref 11.5–15.5)
WBC Count: 8.7 10*3/uL (ref 4.0–10.5)
nRBC: 0 % (ref 0.0–0.2)

## 2023-01-12 NOTE — Progress Notes (Signed)
[facility]  [address] [place],  [place]  [address] [phone]   Clinic Day: 01/12/23  Referring physician: [name], MD   ASSESSMENT & PLAN:  Stage IA Left breast cancer This is a grade 3 triple negative 21 mm tumor for a T1cN0M0 with a Ki-67 of 40%. She has had resection with clear margins and negative sentinel node. However, I recommended adjuvant chemotherapy with docetaxol and cyclophosphamide every 3 weeks for 4 cycles to avoid anthracyclines, completed 07/13/22. She completed adjuvant radiation in February, 2024.   Neuropathy She complains of numbness and pain of both legs and she feels unsteady. Some of this was present even prior to chemotherapy but did worsen with her treatment.    Family history of breast cancer and BRCA positivity Yet she is BRCA negative.   Lung nodule This is a ground glass nodule of the right upper lobe which has shown indolent growth from 1.8 cm in April of 2019 to 2.2 cm as of September of 2023. We will plan to monitor this with repeat imaging periodically.   Liver cirrhosis Liver is decreased in attenuation diffusely with mildly irregular margins. I don't think she was aware of this diagnosis but it may explain her increased toxicities.   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 She has these fairly regularly.    Memory impairment This is consistent with early dementia. She also has significant hearing impairment.   Hypokalemia CMP reveals a normal potassium of 4.0. She will continue potassium 10 meq TID.   Plan:  She has a upcoming consultation with Dr. Georgiana Brooks about having her port removed. She continues to push fluids. Her labs today are pending. I will plan a CT of the chest when she returns as  her last one was September, 2023 and we need to follow up on the enlarging pulmonary nodule of the right upper lobe. I will see her  back in 3 months with CBC, CMP, and possible port flush if she still has her port.  The patient and her daughter agreed with the plan.  The patient was advised to call back if the symptoms worsen or if she has further concerns.   I provided 16 minutes of face-to-face time during this this encounter and > 50% was spent counseling as documented under my assessment and plan.    Lindsay Beckwith, MD [facility] AT [facility] [address] [place] [address] Dept: [phone] Dept Fax: [phone]    CHIEF COMPLAINT:  CC: Invasive ductal carcinoma   Current Treatment:  surveillance  HISTORY OF PRESENT ILLNESS:       Oncology History  Breast cancer of upper-outer quadrant of left female breast (HCC)  03/18/2022 Initial Diagnosis    Breast cancer of upper-outer quadrant of left female breast (HCC)    04/28/2022 Cancer Staging    Staging form: Breast, AJCC 8th Edition - Clinical stage from 04/28/2022: Stage IB (cT1c, cN0(sn), cM0, G3, ER-, PR-, HER2-) - Signed by Lindsay Beckwith, MD on 04/28/2022 Histopathologic type: Infiltrating duct carcinoma, NOS Stage prefix: Initial diagnosis Method of lymph node assessment: Sentinel lymph node biopsy Nuclear grade: G3 Multigene prognostic tests performed: None Histologic grading system: 3 grade system Laterality: Left Tumor size (mm): 20 Lymph-vascular invasion (LVI): LVI not present (absent)/not identified Diagnostic confirmation: Positive histology Specimen type: Excision Staged by: Managing physician Menopausal status: Postmenopausal Ki-67 (%): 40  Stage used in treatment planning: Yes National guidelines used in treatment planning: Yes Type of national guideline used in treatment planning: NCCN    05/07/2022 -  Chemotherapy    Patient is on Treatment Plan : BREAST TC q21d     05/19/2022 Genetic Testing    Negative hereditary cancer genetic testing: no pathogenic variant  detected in [facility] Common Hereditary Cancers +RNA Panel.  Report date is May 19, 2022.     The Common Hereditary Cancers + RNA Panel offered by [facility] includes sequencing, deletion/duplication, and RNA testing of the following 47 genes: APC, ATM, AXIN2, BARD1, BMPR1A, BRCA1, BRCA2, BRIP1, CDH1, CDK4*, CDKN2A (p14ARF)*, CDKN2A (p16INK4a)*, CHEK2, CTNNA1, DICER1, EPCAM (Deletion/duplication testing only), GREM1 (promoter region deletion/duplication testing only), KIT, MEN1, MLH1, MSH2, MSH3, MSH6, MUTYH, NBN, NF1, NHTL1, PALB2, PDGFRA*, PMS2, POLD1, POLE, PTEN, RAD50, RAD51C, RAD51D, SDHB, SDHC, SDHD, SMAD4, SMARCA4. STK11, TP53, TSC1, TSC2, and VHL.  The following genes were evaluated for sequence changes only: SDHA and HOXB13 c.251G>A variant only.  RNA analysis is not performed for the * genes.      Lindsay Brooks is a 72 year old woman who had a screening mammogram on 01/27/2022 and was found to have a possible left breast mass. She does get yearly mammograms due to her strong family history. She thought  she could feel a lump after this was found. She had a diagnostic mammogram on 02/11/2022 with a persistent spiculated hyperdense mass with associated calcifications in the upper inner quadrant. Ultrasound confirmed an irregular hypoechoic mass with peripheral vascularity at 11:30, 3 cm from the nipple. This measures 2.2 cm and the left axilla was negative. She was referred to Lindsay Brooks after a biopsy was done on 02/17/2022, which confirmed a grade 3 invasive ductal carcinoma which is triple negative with a Ki-67 of 40%. Lindsay Brooks took her to surgery on 03/18/2022 for lumpectomy and sentinel lymph node. The final pathology revealed a 20 mm grade 3 invasive ductal carcinoma with DCIS. The margins were clear and the lymph node was negative for a T1cN0M0, stage IA. She has had a port placed and had 4 cycles of TC chemotherapy, completed in December of 2023. She then had radiation, completed in February of 2024, but  required a great deal of supportive care.   INTERVAL HISTORY:  Lindsay Brooks is seen here today for invasive ductal carcinoma for continued supportive care after completing chemotherapy in December, 2023 and completing radiation in February, 2024. Patient states that she feels well and complains of her knees aching. She has a upcoming consultation with Dr. Georgiana Brooks about having her port removed. She continues to push fluids. Her labs today are pending. I will see her back in 3 months with CBC, CMP, and possible port flush if she still has her port. We will also get a repeat CT chest to follow up on an enlarging nodule prior to her next appointment. She denies signs of infection such as sore throat, sinus drainage, cough, or urinary symptoms.  She denies fevers or recurrent chills. She denies pain. She denies nausea, vomiting, chest pain, dyspnea or cough. Her appetite is good and her weight has increased 3 pounds over last 2.5 weeks . She is accompanied at today's visit by her daughter.   HISTORY:  SOCIAL HISTORY She lives with her first daughter. She has 2 daughters. She smokes 1.5 packs a day, and has been smoking since 72 yo. She is not sure about quitting. She did smoke 2 packs per day for over 50 years.  She doesn't drink other than rare occasion. She is widowed. She has been married 4 times. She is disabled.   Menstrual History 72 years old when she had her first child She has had 3 children 58 years old when starting menstrual period Menopause started at 69 She has had a partial hysterectomy in her 17's and went back for complete hysterectomy/BSO later. No hormone replacement therapy   Family History  Problem Relation Age of Onset   Heart attack Mother    Allergies Mother    Heart attack Father    Allergies Father    Kidney disease Father    Hypertension Father    Melanoma Sister    Coronary artery disease Brother    Breast cancer Maternal Aunt    Breast cancer Maternal Aunt    Breast  cancer Paternal Aunt        d. under 61   Pancreatic cancer Maternal Grandmother    Breast cancer Daughter        BRCA1+  Her sister had a brain tumor at age 24    Past Surgical History:  Procedure Laterality Date   APPENDECTOMY     BREAST BIOPSY     CARDIAC CATHETERIZATION  2009   patent RCA stent. mild CAD otherwise.    CATARACT EXTRACTION, BILATERAL  2016   CHOLECYSTECTOMY  2009   FLEXIBLE SIGMOIDOSCOPY N/A 10/21/2022   Procedure: FLEXIBLE SIGMOIDOSCOPY;  Surgeon: Charna Elizabeth, MD;  Location: [facility];  Service: Gastroenterology;  Laterality: N/A;   hysterectomy (other)     age 68   KNEE ARTHROSCOPY Left    left arm surgery     LEFT HEART CATHETERIZATION WITH CORONARY ANGIOGRAM N/A 01/16/2014   Procedure: LEFT HEART CATHETERIZATION WITH CORONARY ANGIOGRAM;  Surgeon: Micheline Chapman, MD;  Location: [facility] CATH LAB;  Service: Cardiovascular;  Laterality: N/A;   left leg angiography     stenting of the left common iliac artery   left rotator cuff repair     right coronary artery stent     Past Medical History:  Diagnosis Date   CAD (coronary artery disease)    a. s/p 2 CoStar DES to RCA 06/2004. b.  LHC 12/09:  EF 65%, oCFX 40%, oRCA 30%, then 30-40% leading into stented seg that is patent, (10-20% ISR), 40% beyond stent,;  c. EF 81%, poor exercise capacity, hypotensive BP response, + chest pain, normal nuclear images (overall low risk)   CKD (chronic kidney disease)    Colon polyps    a. s/p removal 01/2012 - was told one might be cancerous but was not diagnosed with colon cancer - plan is for f/u colonoscopy in 5 yrs.   GERD (gastroesophageal reflux disease)    Hx of echocardiogram    a. Echo 06/2011: EF 55-60%, Gr 1 diast dysfn, mild MR   Insomnia    Mini stroke    History of, in 2005 (before she was diagnosed with CAD).   Osteoporosis    Other and unspecified hyperlipidemia    Other isolated or specific phobias    Peripheral vascular disease, unspecified (HCC)    a.  s/p L common femoral & left illiac stenting previously (patent by angio in 2007). - ABI 06/2011 normal. b. Carotid US 0-39% BICA 06/2011 (for f/u 2 yrs);   c.  ABIs 9/13:  normal (1.1 bilaterally)   Personal history of peptic ulcer disease    Remotely.   Psychiatric pseudoseizure    per patient and family  Tobacco use disorder    Type II or unspecified type diabetes mellitus without mention of complication, not stated as uncontrolled    Unspecified essential hypertension    REVIEW OF SYSTEMS:  Review of Systems  Constitutional:  Positive for fatigue (mild). Negative for appetite change, chills, diaphoresis, fever and unexpected weight change.  HENT:   Positive for hearing loss. Negative for lump/mass, mouth sores, nosebleeds, sore throat, tinnitus, trouble swallowing and voice change.   Eyes: Negative.  Negative for eye problems and icterus.  Respiratory: Negative.  Negative for chest tightness, cough, hemoptysis, shortness of breath and wheezing.   Cardiovascular: Negative.  Negative for chest pain, leg swelling and palpitations.  Gastrointestinal: Negative.  Negative for abdominal distention, abdominal pain, blood in stool, constipation, diarrhea, nausea, rectal pain and vomiting.  Endocrine: Negative.   Genitourinary: Negative.  Negative for bladder incontinence, difficulty urinating, dyspareunia, dysuria, frequency, hematuria, menstrual problem, nocturia, pelvic pain, vaginal bleeding and vaginal discharge.   Musculoskeletal:  Positive for arthralgias. Negative for back pain, flank pain, gait problem, myalgias, neck pain and neck stiffness.       Knee pain  Skin: Negative.  Negative for itching, rash and wound.  Neurological: Negative.  Negative for dizziness, extremity weakness, gait problem, headaches, light-headedness, numbness, seizures and speech difficulty.  Hematological: Negative.  Negative for adenopathy. Does not bruise/bleed easily.  Psychiatric/Behavioral:  Positive for sleep  disturbance. Negative for confusion, decreased concentration, depression and suicidal ideas. The patient is not nervous/anxious.    VITALS:  Blood pressure (!) 112/55, pulse (!) 119, temperature 97.8 F (36.6 C), temperature source Oral, resp. rate 20, height 5\' 1"  (1.549 m), weight 137 lb 1.6 oz (62.2 kg), SpO2 96 %.     Wt Readings from Last 3 Encounters:  06/24/22 140 lb (63.5 kg)  06/22/22 132 lb 1.3 oz (59.9 kg)  06/18/22 137 lb 1.6 oz (62.2 kg)    Body mass index is 25.9 kg/m.   Performance status (ECOG): 0 - Asymptomatic   PHYSICAL EXAM:  Physical Exam Vitals and nursing note reviewed. Exam conducted with a chaperone present.  Constitutional:      General: She is not in acute distress.    Appearance: Normal appearance. She is normal weight. She is not ill-appearing, toxic-appearing or diaphoretic.  HENT:     Head: Normocephalic and atraumatic.     Right Ear: Tympanic membrane, ear canal and external ear normal. There is no impacted cerumen.     Left Ear: Tympanic membrane, ear canal and external ear normal. There is no impacted cerumen.     Nose: Nose normal. No congestion or rhinorrhea.     Mouth/Throat:     Mouth: Mucous membranes are moist.     Pharynx: Oropharynx is clear. No oropharyngeal exudate or posterior oropharyngeal erythema.  Eyes:     General: No scleral icterus.       Right eye: No discharge.        Left eye: No discharge.     Extraocular Movements: Extraocular movements intact.     Conjunctiva/sclera: Conjunctivae normal.     Pupils: Pupils are equal, round, and reactive to light.  Neck:     Vascular: No carotid bruit.  Cardiovascular:     Rate and Rhythm: Normal rate and regular rhythm.     Pulses: Normal pulses.     Heart sounds: Normal heart sounds. No murmur heard.    No friction rub. No gallop.  Pulmonary:     Effort: Pulmonary effort is normal.  No respiratory distress.     Breath sounds: No stridor. Examination of the right-upper field  reveals wheezing. Wheezing present. No rhonchi or rales.     Comments: Expiratory wheeze in the right upper lobe.  Chest:     Chest wall: No tenderness.     Comments: Port in the upper right chest Post radiation change of the left breast with some hyperpigmentation changes and small amount of edema of  her nipple/areolar complex and central breast.  Abdominal:     General: Bowel sounds are normal. There is no distension.     Palpations: Abdomen is soft. There is no hepatomegaly, splenomegaly or mass.     Tenderness: There is no abdominal tenderness. There is no right CVA tenderness, left CVA tenderness, guarding or rebound.     Hernia: No hernia is present.  Musculoskeletal:        General: No swelling, tenderness, deformity or signs of injury. Normal range of motion.     Cervical back: Normal range of motion and neck supple. No rigidity or tenderness.     Right lower leg: No edema.     Left lower leg: No edema.  Lymphadenopathy:     Cervical: No cervical adenopathy.     Right cervical: No superficial, deep or posterior cervical adenopathy.    Left cervical: No superficial, deep or posterior cervical adenopathy.     Upper Body:     Right upper body: No supraclavicular, axillary or pectoral adenopathy.     Left upper body: No supraclavicular, axillary or pectoral adenopathy.  Skin:    General: Skin is warm and dry.     Coloration: Skin is not jaundiced or pale.     Findings: No bruising, erythema, lesion or rash.     Comments: Mild decreased skin trugor  Neurological:     General: No focal deficit present.     Mental Status: She is alert and oriented to person, place, and time. Mental status is at baseline.     Cranial Nerves: No cranial nerve deficit.     Sensory: No sensory deficit.     Motor: No weakness.     Coordination: Coordination normal.     Gait: Gait normal.     Deep Tendon Reflexes: Reflexes normal.  Psychiatric:        Mood and Affect: Mood normal.        Behavior:  Behavior normal.        Thought Content: Thought content normal.        Judgment: Judgment normal.    LABS:    Component Ref Range & Units 2 wk ago (12/25/22) 1 mo ago (12/11/22) 2 mo ago (11/12/22) 2 mo ago (10/22/22) 2 mo ago (10/21/22) 2 mo ago (10/20/22) 2 mo ago (10/20/22)  Hemoglobin 12.0 - 16.0 12.0 12.1 11.5 Low  R 9.6 Low  R 9.9 Low  R 10.4 Low  R 10.4 Low  R  HCT 36 - 46 37 37 35.8 Low  R 30.3 Low  R 31.0 Low  R 32.1 Low  R 32.5 Low  R, CM  Neutrophils Absolute 5.09 6.97 4.6 R  2.8 R 2.9 R   Platelets 150 - 400 K/uL 184 251 192 124 Low  127 Low  137 Low    WBC 7.6 10.1 6.8 R 4.2 R 4.6 R 5.4 R    Component Ref Range & Units 2 wk ago (12/25/22) 1 mo ago (12/11/22) 1 mo ago (12/11/22) 2 mo ago (11/12/22) 2  mo ago (10/22/22) 2 mo ago (10/21/22) 2 mo ago (10/20/22)  Glucose 175  136 124 High  R, CM 88 R, CM 87 R, CM 74 R, CM  BUN 4 - 21 18  29  Abnormal  16 R 10 R 16 R 20 R  CO2 13 - 22 25 Abnormal   25 Abnormal  25 R 24 R 24 R 24 R  Creatinine 0.5 - 1.1 1.0  1.3 Abnormal  1.13 High  R 0.73 R 0.81 R 0.84 R  Potassium 3.5 - 5.1 mEq/L 4.1  3.9 4.6 R 3.9 R 3.1 Low  R 3.5 R  Sodium 137 - 147 143  138 140 R 141 R 139 R 140 R  Chloride 99 - 108 107  106 106 R 109 R 105 R 107 R   Component Ref Range & Units 2 wk ago (12/25/22) 1 mo ago (12/11/22) 2 mo ago (11/12/22) 2 mo ago (10/22/22) 2 mo ago (10/21/22) 2 mo ago (10/20/22) 2 mo ago (10/19/22)  Calcium 8.7 - 10.7 9.1 9.0 9.2 R 8.8 Low  R 8.4 Low  R 8.6 Low  R 8.3 Low  R  Albumin 3.5 - 5.0 3.9 4.0 3.9 R  3.0 Low  R 3.0 Low  R 3.3 Low  R     Component Ref Range & Units 2 wk ago (12/25/22) 1 mo ago (12/11/22) 2 mo ago (11/12/22) 2 mo ago (10/21/22) 2 mo ago (10/20/22) 2 mo ago (10/19/22) 3 mo ago (10/05/22)  Alkaline Phosphatase 25 - 125 94 99 72 R 62 R 63 R 63 R 76  ALT 7 - 35 U/L 9 11 16  R 11 R 11 R 9 R 12  AST 13 - 35 23 94 Abnormal  21 R 19 R 17 R 18 R 25  Bilirubin, Total 0.3 0.4     0.3     Component Ref Range & Units  12/03/2022  Magnesium 1.9 - 2.7 mg/dL 1.9    STUDIES:  EXAM: 10/19/2022 CT ABDOMEN AND PELVIS WITH CONTRAST IMPRESSION: 1. No CT evidence for acute intra-abdominal or pelvic abnormality. 2. Hepatic steatosis. 3. Aortic atherosclerosis.(ICD10-I70.0).   EXAM:05/05/22 NUCLEAR MEDICINE WHOLE BODY BONE SCAN IMPRESSION: Uptake at LEFT tibial metaphysis especially medially, nonspecific; radiographic correlation recommended to exclude metastatic focus.   Focal abnormal tracer uptake at the LEFT temporo-occipital calvarium, corresponding to and focus of abnormal marrow signal on prior MR head exams back to 2010, nonspecific but likely benign.   No additional scintigraphic findings to suggest osseous metastatic disease.   Diffuse calvarial uptake, favoring metabolic bone disease.    EXAM:05/05/22 CT CHEST WITH CONTRAST IMPRESSION: Ground glass nodule in the right upper lobe with a possible developing internal solid component. Lesion has shown indolent growth from 11/11/2017, at which time it measured approcimately 1.2 x 1.8 cm. Findings are worrisome for adenocarcinoma.  No definitive evidence of metastatic breast cancer. Steatotic cirrhotic liver     Allergies  Allergen Reactions   Amoxicillin Hives and Nausea And Vomiting   Claritin [Loratadine] Other (See Comments)    Pt's daughter notified us of this allergy.   Insulins Nausea And Vomiting and Other (See Comments)    Patient unsure of name of insulin, will call us back   Vioxx [Rofecoxib] Hives   Celecoxib Hives and Nausea And Vomiting   Liraglutide Nausea And Vomiting   Propranolol Diarrhea and Nausea And Vomiting   Current Outpatient Medications on File Prior to Visit  Medication Sig Dispense Refill  aspirin EC 81 MG tablet Take 1 tablet (81 mg total) by mouth daily. 30 tablet 11   busPIRone (BUSPAR) 7.5 MG tablet Take 1 tablet by mouth in the morning, at noon, and at bedtime.     clopidogrel (PLAVIX) 75 MG tablet Take 1  tablet (75 mg total) by mouth daily.     divalproex (DEPAKOTE) 250 MG DR tablet Take 250 mg by mouth 2 (two) times daily.     donepezil (ARICEPT) 5 MG tablet Take 5 mg by mouth daily.     DULoxetine (CYMBALTA) 60 MG capsule Take 120 mg by mouth at bedtime.      famotidine (PEPCID) 20 MG tablet Take 20 mg by mouth 2 (two) times daily.     fenofibrate (TRICOR) 145 MG tablet Take 145 mg by mouth daily.     fexofenadine (ALLEGRA) 180 MG tablet Take 180 mg by mouth daily.     fluconazole (DIFLUCAN) 100 MG tablet Take 1 tablet (100 mg total) by mouth daily. 10 tablet 0   gabapentin (NEURONTIN) 600 MG tablet Take 600 mg by mouth 3 (three) times daily.     hydrALAZINE (APRESOLINE) 50 MG tablet Take 1 tablet (50 mg total) by mouth 3 (three) times daily.     HYDROcodone-acetaminophen (NORCO) 5-325 MG tablet Take 1 tablet by mouth every 4 (four) hours as needed for moderate pain. 30 tablet 0   JARDIANCE 25 MG TABS tablet Take 25 mg by mouth daily.     LORazepam (ATIVAN) 0.5 MG tablet Take 1 tablet (0.5 mg total) by mouth at bedtime as needed for sleep. May take one tab prior to imaging studies due to anxiety 30 tablet 0   metoprolol tartrate (LOPRESSOR) 25 MG tablet Take 25 mg by mouth 2 (two) times daily.     nicotine (NICODERM CQ - DOSED IN MG/24 HOURS) 21 mg/24hr patch Place 1 patch (21 mg total) onto the skin daily. 28 patch 0   nitroGLYCERIN (NITROSTAT) 0.4 MG SL tablet Place 0.4 mg under the tongue every 5 (five) minutes as needed. For chest pain     ondansetron (ZOFRAN-ODT) 4 MG disintegrating tablet Take 4 mg by mouth every 6 (six) hours as needed.     pantoprazole (PROTONIX) 40 MG tablet Take 40 mg by mouth 2 (two) times daily.     potassium chloride (KLOR-CON M) 10 MEQ tablet Take 1 tablet (10 mEq total) by mouth 3 (three) times daily. 90 tablet 5   PROLIA 60 MG/ML SOSY injection Inject into the skin.     TRESIBA FLEXTOUCH 100 UNIT/ML FlexTouch Pen Inject into the skin.      valsartan-hydrochlorothiazide (DIOVAN-HCT) 320-25 MG tablet TAKE 1 TABLET BY ORAL ROUTE ONCE DAILY FOR HIGH BLOOD PRESSURE     No current facility-administered medications on file prior to visit.     I,Jasmine M Lassiter,acting as a scribe for Lindsay Beckwith, MD.,have documented all relevant documentation on the behalf of Lindsay Beckwith, MD,as directed by  Lindsay Beckwith, MD while in the presence of Lindsay Beckwith, MD.

## 2023-01-14 ENCOUNTER — Telehealth: Payer: Self-pay

## 2023-01-14 NOTE — Telephone Encounter (Signed)
-----   Message from Dellia Beckwith, MD sent at 01/12/2023  6:00 PM EDT ----- Regarding: CT Pls explain to her daughter Minerva Ends that Lindsay Brooks will be due for repeat CT chest by Sept to f/u on the pulmonary nodule seen last year. So will need to change appt to tests (labs and CT)  on Tues,f/u Thur or tests on 9/4 and f/u 9/6 Wed/Fri)

## 2023-01-14 NOTE — Telephone Encounter (Signed)
Lindsay Brooks notified of message

## 2023-01-15 ENCOUNTER — Other Ambulatory Visit: Payer: Self-pay | Admitting: Oncology

## 2023-01-15 ENCOUNTER — Telehealth: Payer: Self-pay

## 2023-01-15 NOTE — Telephone Encounter (Signed)
-----   Message from Dellia Beckwith, MD sent at 01/15/2023  7:14 AM EDT ----- Regarding: call Tell daughter Lindsay Brooks that she is mildly anemic but rest is good except very low calcium.  She needs to take calcium 600 mg tid - and may need something for constipation with that

## 2023-01-22 ENCOUNTER — Encounter: Payer: Self-pay | Admitting: Oncology

## 2023-02-01 LAB — HM MAMMOGRAPHY

## 2023-03-02 ENCOUNTER — Encounter: Payer: Self-pay | Admitting: Oncology

## 2023-03-02 ENCOUNTER — Other Ambulatory Visit: Payer: Self-pay | Admitting: Oncology

## 2023-03-02 DIAGNOSIS — R911 Solitary pulmonary nodule: Secondary | ICD-10-CM

## 2023-03-03 ENCOUNTER — Encounter: Payer: Self-pay | Admitting: Oncology

## 2023-03-04 ENCOUNTER — Telehealth: Payer: Self-pay

## 2023-03-04 ENCOUNTER — Other Ambulatory Visit: Payer: Self-pay | Admitting: Oncology

## 2023-03-04 DIAGNOSIS — G620 Drug-induced polyneuropathy: Secondary | ICD-10-CM

## 2023-03-04 MED ORDER — HYDROCODONE-ACETAMINOPHEN 5-325 MG PO TABS
1.0000 | ORAL_TABLET | ORAL | 0 refills | Status: DC | PRN
Start: 2023-03-04 — End: 2023-04-19

## 2023-03-04 NOTE — Telephone Encounter (Signed)
Patients daughter called, reported patient fell on Monday and was seen in the ER with x-rays and CT . Patient complaints of left rib pain and left arm pain since fall . ER gave them oxycodone 10 tablets. They are wanting a refill of hydrocodone from [place] last filled in March. She rported pain as a 7 increasing to a 10 at times.

## 2023-03-04 NOTE — Telephone Encounter (Signed)
Made aware pain medication script sent to pharmacy.

## 2023-03-11 ENCOUNTER — Telehealth: Payer: Self-pay | Admitting: Oncology

## 2023-03-11 DIAGNOSIS — E1169 Type 2 diabetes mellitus with other specified complication: Secondary | ICD-10-CM | POA: Diagnosis not present

## 2023-03-11 DIAGNOSIS — E785 Hyperlipidemia, unspecified: Secondary | ICD-10-CM | POA: Diagnosis not present

## 2023-03-11 NOTE — Telephone Encounter (Signed)
03/11/23 spoke with patients daughter and scheduled PET SCAN on 03/17/23@1230pm 

## 2023-03-12 ENCOUNTER — Encounter: Payer: Self-pay | Admitting: Oncology

## 2023-03-16 ENCOUNTER — Telehealth: Payer: Self-pay | Admitting: Oncology

## 2023-03-16 NOTE — Telephone Encounter (Signed)
03/16/23 Spoke with daughter and confirmed next appt

## 2023-03-18 ENCOUNTER — Encounter: Payer: Self-pay | Admitting: Oncology

## 2023-03-18 NOTE — Progress Notes (Incomplete)
[facility]  [address] [place],  [place]  [address] [phone]   Clinic Day: 01/12/23  Referring physician: [name], MD   ASSESSMENT & PLAN:  Stage IA Left breast cancer This is a grade 3 triple negative 21 mm tumor for a T1cN0M0 with a Ki-67 of 40%. She has had resection with clear margins and negative sentinel node. However, I recommended adjuvant chemotherapy with docetaxol and cyclophosphamide every 3 weeks for 4 cycles to avoid anthracyclines, completed 07/13/22. She completed adjuvant radiation in February, 2024.   Neuropathy She complains of numbness and pain of both legs and she feels unsteady. Some of this was present even prior to chemotherapy but did worsen with her treatment.    Family history of breast cancer and BRCA positivity Yet she is BRCA negative.   Lung nodule This is a ground glass nodule of the right upper lobe which has shown indolent growth from 1.8 cm in April of 2019 to 2.2 cm as of September of 2023. We will plan to monitor this with repeat imaging periodically.   Liver cirrhosis Liver is decreased in attenuation diffusely with mildly irregular margins. I don't think she was aware of this diagnosis but it may explain her increased toxicities.   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 She has these fairly regularly.    Memory impairment This is consistent with early dementia. She also has significant hearing impairment.   Hypokalemia CMP reveals a normal potassium of 4.0. She will continue potassium 10 meq TID.   Plan:  She has a upcoming consultation with Dr. Georgiana Shore about having her port removed. She continues to push fluids. Her labs today are pending. I will plan a CT of the chest when she returns as  her last one was September, 2023 and we need to follow up on the enlarging pulmonary nodule of the right upper lobe. I will see her  back in 3 months with CBC, CMP, and possible port flush if she still has her port.  The patient and her daughter agreed with the plan.  The patient was advised to call back if the symptoms worsen or if she has further concerns.   I provided 16 minutes of face-to-face time during this this encounter and > 50% was spent counseling as documented under my assessment and plan.    Dellia Beckwith, MD [facility] [address] [place] [address] Dept: [phone] Dept Fax: [phone]    CHIEF COMPLAINT:  CC: Invasive ductal carcinoma   Current Treatment:  surveillance  HISTORY OF PRESENT ILLNESS:       Oncology History  Breast cancer of upper-outer quadrant of left female breast (HCC)  03/18/2022 Initial Diagnosis    Breast cancer of upper-outer quadrant of left female breast (HCC)    04/28/2022 Cancer Staging    Staging form: Breast, AJCC 8th Edition - Clinical stage from 04/28/2022: Stage IB (cT1c, cN0(sn), cM0, G3, ER-, PR-, HER2-) - Signed by Dellia Beckwith, MD on 04/28/2022 Histopathologic type: Infiltrating duct carcinoma, NOS Stage prefix: Initial diagnosis Method of lymph node assessment: Sentinel lymph node biopsy Nuclear grade: G3 Multigene prognostic tests performed: None Histologic grading system: 3 grade system Laterality: Left Tumor size (mm): 20 Lymph-vascular invasion (LVI): LVI not present (absent)/not identified Diagnostic confirmation: Positive histology Specimen type: Excision Staged by: Managing physician Menopausal status: Postmenopausal Ki-67 (%): 40  Stage used in treatment planning: Yes National guidelines used in treatment planning: Yes Type of national guideline used in treatment planning: NCCN    05/07/2022 -  Chemotherapy    Patient is on Treatment Plan : BREAST TC q21d     05/19/2022 Genetic Testing    Negative hereditary cancer genetic testing: no pathogenic variant  detected in [facility] Common Hereditary Cancers +RNA Panel.  Report date is May 19, 2022.     The Common Hereditary Cancers + RNA Panel offered by [facility] includes sequencing, deletion/duplication, and RNA testing of the following 47 genes: APC, ATM, AXIN2, BARD1, BMPR1A, BRCA1, BRCA2, BRIP1, CDH1, CDK4*, CDKN2A (p14ARF)*, CDKN2A (p16INK4a)*, CHEK2, CTNNA1, DICER1, EPCAM (Deletion/duplication testing only), GREM1 (promoter region deletion/duplication testing only), KIT, MEN1, MLH1, MSH2, MSH3, MSH6, MUTYH, NBN, NF1, NHTL1, PALB2, PDGFRA*, PMS2, POLD1, POLE, PTEN, RAD50, RAD51C, RAD51D, SDHB, SDHC, SDHD, SMAD4, SMARCA4. STK11, TP53, TSC1, TSC2, and VHL.  The following genes were evaluated for sequence changes only: SDHA and HOXB13 c.251G>A variant only.  RNA analysis is not performed for the * genes.      Lindsay Brooks is a 72 year old woman who had a screening mammogram on 01/27/2022 and was found to have a possible left breast mass. She does get yearly mammograms due to her strong family history. She thought  she could feel a lump after this was found. She had a diagnostic mammogram on 02/11/2022 with a persistent spiculated hyperdense mass with associated calcifications in the upper inner quadrant. Ultrasound confirmed an irregular hypoechoic mass with peripheral vascularity at 11:30, 3 cm from the nipple. This measures 2.2 cm and the left axilla was negative. She was referred to [name] after a biopsy was done on 02/17/2022, which confirmed a grade 3 invasive ductal carcinoma which is triple negative with a Ki-67 of 40%. [name] took her to surgery on 03/18/2022 for lumpectomy and sentinel lymph node. The final pathology revealed a 20 mm grade 3 invasive ductal carcinoma with DCIS. The margins were clear and the lymph node was negative for a T1cN0M0, stage IA. She has had a port placed and had 4 cycles of TC chemotherapy, completed in December of 2023. She then had radiation, completed in February of 2024, but  required a great deal of supportive care.   INTERVAL HISTORY:  Lindsay Brooks is seen here today for invasive ductal carcinoma for continued supportive care after completing chemotherapy in December, 2023 and completing radiation in February, 2024. Patient states that she feels *** and ***     I will see her  back in *** with CBC and CMP.  She  denies signs of infections such as sore throat, sinus drainage, cough or urinary symptoms. She  denies fever or recurrent chills. She  also deny nausea, vomiting, chest pain, or dyspnea. Her  appetite is *** and Her  weight {Weight change:10426}     well and complains of her knees aching. She has a upcoming consultation with Dr. Georgiana Shore about having her port removed. She continues to push fluids. Her labs today are pending. I will see her back in 3 months with CBC, CMP, and possible port flush if she still has her port. We will also get a repeat CT chest to follow up on an enlarging nodule prior to her next appointment. She denies signs of infection such as sore throat, sinus drainage, cough, or urinary symptoms.  She denies fevers or recurrent chills. She denies pain. She denies nausea, vomiting, chest pain, dyspnea or cough. Her appetite is  good and her weight has increased 3 pounds over last 2.5 weeks . She is accompanied at today's visit by her daughter.   HISTORY:  SOCIAL HISTORY She lives with her first daughter. She has 2 daughters. She smokes 1.5 packs a day, and has been smoking since 72 yo. She is not sure about quitting. She did smoke 2 packs per day for over 50 years. She doesn't drink other than rare occasion. She is widowed. She has been married 4 times. She is disabled.   Menstrual History 72 years old when she had her first child She has had 3 children 7 years old when starting menstrual period Menopause started at 42 She has had a partial hysterectomy in her 48's and went back for complete hysterectomy/BSO later. No hormone replacement  therapy   Family History  Problem Relation Age of Onset   Heart attack Mother    Allergies Mother    Heart attack Father    Allergies Father    Kidney disease Father    Hypertension Father    Melanoma Sister    Coronary artery disease Brother    Breast cancer Maternal Aunt    Breast cancer Maternal Aunt    Breast cancer Paternal Aunt        d. under 69   Pancreatic cancer Maternal Grandmother    Breast cancer Daughter        BRCA1+  Her sister had a brain tumor at age 55    Past Surgical History:  Procedure Laterality Date   APPENDECTOMY     BREAST BIOPSY     CARDIAC CATHETERIZATION  2009   patent RCA stent. mild CAD otherwise.    CATARACT EXTRACTION, BILATERAL  2016   CHOLECYSTECTOMY  2009   FLEXIBLE SIGMOIDOSCOPY N/A 10/21/2022   Procedure: FLEXIBLE SIGMOIDOSCOPY;  Surgeon: Charna Elizabeth, MD;  Location: [facility];  Service: Gastroenterology;  Laterality: N/A;   hysterectomy (other)     age 58   KNEE ARTHROSCOPY Left    left arm surgery     LEFT HEART CATHETERIZATION WITH CORONARY ANGIOGRAM N/A 01/16/2014   Procedure: LEFT HEART CATHETERIZATION WITH CORONARY ANGIOGRAM;  Surgeon: Micheline Chapman, MD;  Location[facility];  Service: Cardiovascular;  Laterality: N/A;   left leg angiography     stenting of the left common iliac artery   left rotator cuff repair     right coronary artery stent     Past Medical History:  Diagnosis Date   CAD (coronary artery disease)    a. s/p 2 CoStar DES to RCA 06/2004. b.  LHC 12/09:  EF 65%, oCFX 40%, oRCA 30%, then 30-40% leading into stented seg that is patent, (10-20% ISR), 40% beyond stent,;  c. EF 81%, poor exercise capacity, hypotensive BP response, + chest pain, normal nuclear images (overall low risk)   CKD (chronic kidney disease)    Colon polyps    a. s/p removal 01/2012 - was told one might be cancerous but was not diagnosed with colon cancer - plan is for f/u colonoscopy in 5 yrs.   GERD (gastroesophageal reflux disease)     Hx of echocardiogram    a. Echo 06/2011: EF 55-60%, Gr 1 diast dysfn, mild MR   Insomnia    Mini stroke    History of, in 2005 (before she was diagnosed with CAD).   Osteoporosis    Other and unspecified hyperlipidemia    Other isolated or specific phobias    Peripheral vascular disease, unspecified (  HCC)    a. s/p L common femoral & left illiac stenting previously (patent by angio in 2007). - ABI 06/2011 normal. b. Carotid US 0-39% BICA 06/2011 (for f/u 2 yrs);   c.  ABIs 9/13:  normal (1.1 bilaterally)   Personal history of peptic ulcer disease    Remotely.   Psychiatric pseudoseizure    per patient and family   Tobacco use disorder    Type II or unspecified type diabetes mellitus without mention of complication, not stated as uncontrolled    Unspecified essential hypertension    REVIEW OF SYSTEMS:  Review of Systems  Constitutional:  Positive for fatigue (mild). Negative for appetite change, chills, diaphoresis, fever and unexpected weight change.  HENT:   Positive for hearing loss. Negative for lump/mass, mouth sores, nosebleeds, sore throat, tinnitus, trouble swallowing and voice change.   Eyes: Negative.  Negative for eye problems and icterus.  Respiratory: Negative.  Negative for chest tightness, cough, hemoptysis, shortness of breath and wheezing.   Cardiovascular: Negative.  Negative for chest pain, leg swelling and palpitations.  Gastrointestinal: Negative.  Negative for abdominal distention, abdominal pain, blood in stool, constipation, diarrhea, nausea, rectal pain and vomiting.  Endocrine: Negative.   Genitourinary: Negative.  Negative for bladder incontinence, difficulty urinating, dyspareunia, dysuria, frequency, hematuria, menstrual problem, nocturia, pelvic pain, vaginal bleeding and vaginal discharge.   Musculoskeletal:  Positive for arthralgias. Negative for back pain, flank pain, gait problem, myalgias, neck pain and neck stiffness.       Knee pain  Skin:  Negative.  Negative for itching, rash and wound.  Neurological: Negative.  Negative for dizziness, extremity weakness, gait problem, headaches, light-headedness, numbness, seizures and speech difficulty.  Hematological: Negative.  Negative for adenopathy. Does not bruise/bleed easily.  Psychiatric/Behavioral:  Positive for sleep disturbance. Negative for confusion, decreased concentration, depression and suicidal ideas. The patient is not nervous/anxious.    VITALS:  Blood pressure (!) 112/55, pulse (!) 119, temperature 97.8 F (36.6 C), temperature source Oral, resp. rate 20, height 5\' 1"  (1.549 m), weight 137 lb 1.6 oz (62.2 kg), SpO2 96 %.     Wt Readings from Last 3 Encounters:  06/24/22 140 lb (63.5 kg)  06/22/22 132 lb 1.3 oz (59.9 kg)  06/18/22 137 lb 1.6 oz (62.2 kg)    Body mass index is 25.9 kg/m.   Performance status (ECOG): 0 - Asymptomatic   PHYSICAL EXAM:  Physical Exam Vitals and nursing note reviewed. Exam conducted with a chaperone present.  Constitutional:      General: She is not in acute distress.    Appearance: Normal appearance. She is normal weight. She is not ill-appearing, toxic-appearing or diaphoretic.  HENT:     Head: Normocephalic and atraumatic.     Right Ear: Tympanic membrane, ear canal and external ear normal. There is no impacted cerumen.     Left Ear: Tympanic membrane, ear canal and external ear normal. There is no impacted cerumen.     Nose: Nose normal. No congestion or rhinorrhea.     Mouth/Throat:     Mouth: Mucous membranes are moist.     Pharynx: Oropharynx is clear. No oropharyngeal exudate or posterior oropharyngeal erythema.  Eyes:     General: No scleral icterus.       Right eye: No discharge.        Left eye: No discharge.     Extraocular Movements: Extraocular movements intact.     Conjunctiva/sclera: Conjunctivae normal.     Pupils: Pupils are  equal, round, and reactive to light.  Neck:     Vascular: No carotid bruit.   Cardiovascular:     Rate and Rhythm: Normal rate and regular rhythm.     Pulses: Normal pulses.     Heart sounds: Normal heart sounds. No murmur heard.    No friction rub. No gallop.  Pulmonary:     Effort: Pulmonary effort is normal. No respiratory distress.     Breath sounds: No stridor. Examination of the right-upper field reveals wheezing. Wheezing present. No rhonchi or rales.     Comments: Expiratory wheeze in the right upper lobe.  Chest:     Chest wall: No tenderness.     Comments: Port in the upper right chest Post radiation change of the left breast with some hyperpigmentation changes and small amount of edema of  her nipple/areolar complex and central breast.  Abdominal:     General: Bowel sounds are normal. There is no distension.     Palpations: Abdomen is soft. There is no hepatomegaly, splenomegaly or mass.     Tenderness: There is no abdominal tenderness. There is no right CVA tenderness, left CVA tenderness, guarding or rebound.     Hernia: No hernia is present.  Musculoskeletal:        General: No swelling, tenderness, deformity or signs of injury. Normal range of motion.     Cervical back: Normal range of motion and neck supple. No rigidity or tenderness.     Right lower leg: No edema.     Left lower leg: No edema.  Lymphadenopathy:     Cervical: No cervical adenopathy.     Right cervical: No superficial, deep or posterior cervical adenopathy.    Left cervical: No superficial, deep or posterior cervical adenopathy.     Upper Body:     Right upper body: No supraclavicular, axillary or pectoral adenopathy.     Left upper body: No supraclavicular, axillary or pectoral adenopathy.  Skin:    General: Skin is warm and dry.     Coloration: Skin is not jaundiced or pale.     Findings: No bruising, erythema, lesion or rash.     Comments: Mild decreased skin trugor  Neurological:     General: No focal deficit present.     Mental Status: She is alert and oriented to  person, place, and time. Mental status is at baseline.     Cranial Nerves: No cranial nerve deficit.     Sensory: No sensory deficit.     Motor: No weakness.     Coordination: Coordination normal.     Gait: Gait normal.     Deep Tendon Reflexes: Reflexes normal.  Psychiatric:        Mood and Affect: Mood normal.        Behavior: Behavior normal.        Thought Content: Thought content normal.        Judgment: Judgment normal.    LABS:    Component Ref Range & Units 2 mo ago (01/12/23) 2 mo ago (12/25/22) 2 mo ago (12/25/22) 3 mo ago (12/11/22) 3 mo ago (12/11/22) 4 mo ago (11/12/22) 4 mo ago (10/22/22)  WBC Count 4.0 - 10.5 K/uL 8.7  7.6 R  10.1 R 6.8 4.2  RBC 3.87 - 5.11 MIL/uL 3.82 Low  3.86 Abnormal  R  3.83 Abnormal  R  3.72 Low  3.25 Low   Hemoglobin 12.0 - 15.0 g/dL 52.8 Low   41.3 R  24.4 R  11.5 Low  9.6 Low   HCT 36.0 - 46.0 % 37.5  37 R  37 R 35.8 Low  30.3 Low   MCV 80.0 - 100.0 fL 98.2     96.2 93.2  MCH 26.0 - 34.0 pg 29.6     30.9 29.5  MCHC 30.0 - 36.0 g/dL 16.1     09.6 04.5  RDW 11.5 - 15.5 % 14.4     17.9 High  15.9 High   Platelet Count 150 - 400 K/uL 230  184  251 192 124 Low    Component Ref Range & Units 2 wk ago (12/25/22) 1 mo ago (12/11/22) 1 mo ago (12/11/22) 2 mo ago (11/12/22) 2 mo ago (10/22/22) 2 mo ago (10/21/22) 2 mo ago (10/20/22)  Glucose 175  136 124 High  R, CM 88 R, CM 87 R, CM 74 R, CM  BUN 4 - 21 18  29  Abnormal  16 R 10 R 16 R 20 R  CO2 13 - 22 25 Abnormal   25 Abnormal  25 R 24 R 24 R 24 R  Creatinine 0.5 - 1.1 1.0  1.3 Abnormal  1.13 High  R 0.73 R 0.81 R 0.84 R  Potassium 3.5 - 5.1 mEq/L 4.1  3.9 4.6 R 3.9 R 3.1 Low  R 3.5 R  Sodium 137 - 147 143  138 140 R 141 R 139 R 140 R  Chloride 99 - 108 107  106 106 R 109 R 105 R 107 R            Component Ref Range & Units 2 mo ago (01/12/23) 2 mo ago (12/25/22) 2 mo ago (12/25/22) 2 mo ago (12/25/22) 3 mo ago (12/11/22) 3 mo ago (12/11/22) 3 mo ago (12/11/22)  Sodium 135 - 145 mmol/L 142    143 R   138 R  Potassium 3.5 - 5.1 mmol/L 4.1   4.1 R   3.9 R  Chloride 98 - 111 mmol/L 110   107 R   106 R  CO2 22 - 32 mmol/L 22   25 Abnormal  R   25 Abnormal  R  Glucose, Bld 70 - 99 mg/dL 409 High    811 R   914 R  BUN 8 - 23 mg/dL 18   18 R   29 Abnormal  R  Creatinine 0.44 - 1.00 mg/dL 7.82   1.0 R   1.3 Abnormal  R  Calcium 8.9 - 10.3 mg/dL 6.7 Low   9.1 R   9.0 R   Total Protein 6.5 - 8.1 g/dL 7.0        Albumin 3.5 - 5.0 g/dL 3.5  3.9 R   4.0 R   AST 15 - 41 U/L 14 Low  23 R   94 Abnormal  R    ALT 0 - 44 U/L 9 9 R   11 R    Alkaline Phosphatase 38 - 126 U/L 77 94 R   99 R    Total Bilirubin 0.3 - 1.2 mg/dL 0.5        GFR, Estimated >60 mL/min >60           Component Ref Range & Units 12/03/2022  Magnesium 1.9 - 2.7 mg/dL 1.9    STUDIES:  EXAM: 10/19/2022 CT ABDOMEN AND PELVIS WITH CONTRAST IMPRESSION: 1. No CT evidence for acute intra-abdominal or pelvic abnormality. 2. Hepatic steatosis. 3. Aortic atherosclerosis.(ICD10-I70.0).   EXAM:05/05/22 NUCLEAR MEDICINE WHOLE BODY BONE SCAN IMPRESSION:  Uptake at LEFT tibial metaphysis especially medially, nonspecific; radiographic correlation recommended to exclude metastatic focus.   Focal abnormal tracer uptake at the LEFT temporo-occipital calvarium, corresponding to and focus of abnormal marrow signal on prior MR head exams back to 2010, nonspecific but likely benign.   No additional scintigraphic findings to suggest osseous metastatic disease.   Diffuse calvarial uptake, favoring metabolic bone disease.    EXAM:05/05/22 CT CHEST WITH CONTRAST IMPRESSION: Ground glass nodule in the right upper lobe with a possible developing internal solid component. Lesion has shown indolent growth from 11/11/2017, at which time it measured approcimately 1.2 x 1.8 cm. Findings are worrisome for adenocarcinoma.  No definitive evidence of metastatic breast cancer. Steatotic cirrhotic liver     Allergies  Allergen  Reactions   Amoxicillin Hives and Nausea And Vomiting   Claritin [Loratadine] Other (See Comments)    Pt's daughter notified us of this allergy.   Insulins Nausea And Vomiting and Other (See Comments)    Patient unsure of name of insulin, will call us back   Vioxx [Rofecoxib] Hives   Celecoxib Hives and Nausea And Vomiting   Liraglutide Nausea And Vomiting   Propranolol Diarrhea and Nausea And Vomiting   Current Outpatient Medications on File Prior to Visit  Medication Sig Dispense Refill   aspirin EC 81 MG tablet Take 1 tablet (81 mg total) by mouth daily. 30 tablet 11   busPIRone (BUSPAR) 7.5 MG tablet Take 1 tablet by mouth in the morning, at noon, and at bedtime.     calcium carbonate (OS-CAL) 600 MG tablet Take 600 mg by mouth 3 (three) times daily with meals.     clopidogrel (PLAVIX) 75 MG tablet Take 1 tablet (75 mg total) by mouth daily.     divalproex (DEPAKOTE) 250 MG DR tablet Take 250 mg by mouth 2 (two) times daily.     docusate sodium (COLACE) 100 MG capsule Take 100 mg by mouth 2 (two) times daily.     donepezil (ARICEPT) 5 MG tablet Take 5 mg by mouth daily.     DULoxetine (CYMBALTA) 60 MG capsule Take 120 mg by mouth at bedtime.      famotidine (PEPCID) 20 MG tablet Take 20 mg by mouth 2 (two) times daily.     fenofibrate (TRICOR) 145 MG tablet Take 145 mg by mouth daily.     fexofenadine (ALLEGRA) 180 MG tablet Take 180 mg by mouth daily.     fluconazole (DIFLUCAN) 100 MG tablet Take 1 tablet (100 mg total) by mouth daily. 10 tablet 0   gabapentin (NEURONTIN) 100 MG capsule Take 100 mg by mouth 2 (two) times daily.     hydrALAZINE (APRESOLINE) 50 MG tablet Take 1 tablet (50 mg total) by mouth 3 (three) times daily.     HYDROcodone-acetaminophen (NORCO) 5-325 MG tablet Take 1 tablet by mouth every 4 (four) hours as needed for moderate pain. 30 tablet 0   JARDIANCE 25 MG TABS tablet Take 25 mg by mouth daily.     LORazepam (ATIVAN) 0.5 MG tablet Take 1 tablet (0.5 mg  total) by mouth at bedtime as needed for sleep. May take one tab prior to imaging studies due to anxiety 30 tablet 0   metoprolol tartrate (LOPRESSOR) 25 MG tablet Take 25 mg by mouth 2 (two) times daily.     nicotine (NICODERM CQ - DOSED IN MG/24 HOURS) 21 mg/24hr patch Place 1 patch (21 mg total) onto the skin daily. 28 patch 0  nitroGLYCERIN (NITROSTAT) 0.4 MG SL tablet Place 0.4 mg under the tongue every 5 (five) minutes as needed. For chest pain     ondansetron (ZOFRAN-ODT) 4 MG disintegrating tablet Take 4 mg by mouth every 6 (six) hours as needed.     pantoprazole (PROTONIX) 40 MG tablet Take 40 mg by mouth 2 (two) times daily.     potassium chloride (KLOR-CON M) 10 MEQ tablet Take 1 tablet (10 mEq total) by mouth 3 (three) times daily. 90 tablet 5   PROLIA 60 MG/ML SOSY injection Inject into the skin.     TRESIBA FLEXTOUCH 100 UNIT/ML FlexTouch Pen Inject into the skin.     valsartan-hydrochlorothiazide (DIOVAN-HCT) 320-25 MG tablet TAKE 1 TABLET BY ORAL ROUTE ONCE DAILY FOR HIGH BLOOD PRESSURE     No current facility-administered medications on file prior to visit.      I,Oluwatobi Asade,acting as a scribe for Dellia Beckwith, MD.,have documented all relevant documentation on the behalf of Dellia Beckwith, MD,as directed by  Dellia Beckwith, MD while in the presence of Dellia Beckwith, MD.

## 2023-03-19 ENCOUNTER — Ambulatory Visit: Admitting: Oncology

## 2023-03-22 DIAGNOSIS — U071 COVID-19: Secondary | ICD-10-CM | POA: Diagnosis not present

## 2023-03-22 DIAGNOSIS — R0603 Acute respiratory distress: Secondary | ICD-10-CM | POA: Diagnosis not present

## 2023-03-24 ENCOUNTER — Ambulatory Visit: Admitting: Oncology

## 2023-03-25 ENCOUNTER — Encounter: Payer: Self-pay | Admitting: Emergency Medicine

## 2023-03-25 ENCOUNTER — Ambulatory Visit (INDEPENDENT_AMBULATORY_CARE_PROVIDER_SITE_OTHER): Payer: Medicare Other | Admitting: Emergency Medicine

## 2023-03-25 VITALS — BP 138/70 | HR 79 | Temp 97.0°F | Ht 61.0 in | Wt 124.0 lb

## 2023-03-25 DIAGNOSIS — J449 Chronic obstructive pulmonary disease, unspecified: Secondary | ICD-10-CM

## 2023-03-25 DIAGNOSIS — R911 Solitary pulmonary nodule: Secondary | ICD-10-CM | POA: Diagnosis not present

## 2023-03-25 NOTE — Assessment & Plan Note (Signed)
Solitary enlarging right upper lobe pulmonary nodule, groundglass, most consistent with a well-differentiated adenocarcinoma in a patient with heavy tobacco use.  Discussed options with her and we have decided to pursue navigational bronchoscopy.  Risk and benefits discussed.  She understands and wants to proceed  We reviewed your CT scan of the chest today. We will arrange for navigational bronchoscopy to evaluate a right upper lobe pulmonary nodule.  This will be done under general anesthesia as an outpatient at [facility] endoscopy.  You will need a designated driver and someone to watch you at home on that day after the procedure.  You will need to stop your Plavix 5 days prior to the procedure and your aspirin 2 days prior to the procedure.  We will try to get this arranged for 04/19/2023. Follow with T Parrett in about 1 month Follow Dr. Delton Coombes in 3 months, sooner if you have problems.

## 2023-03-25 NOTE — Progress Notes (Signed)
Subjective:    Patient ID: Lindsay Brooks, female    DOB: 1951-03-06, 72 y.o.   MRN: [id number]  HPI 72 year old woman with a history of tobacco use (66 pack years, 1.5 pack daily), stage Ia left breast cancer, CAD, CKD, colon polyps, cerebrovascular disease, type 2 diabetes, hypertension, conversion disorder with pseudoseizures.  She is referred today for review of CT scan of the chest.  She has a groundglass right upper lobe pulmonary nodule that has evolved on serial CT imaging going back to April 2019.  She is here to discuss her abnormal CT scan of the chest. She tries to be active but she does get exertional SOB walking to mailbox. Walks through the store slowly, has to stop. She sleeps w 2.5L/min, but doesn't use during the day because she is smoking. Daily cough w tan/green mucous.    CT chest 02/19/2023 reviewed by me Duke Salvia) shows a 2.5 cm groundglass pulmonary nodule with a 7 mm solid component.  This is increased in size compared with prior study done 05/05/2022.  Pulmonary function testing 12/08/2013 reviewed by me show moderate obstruction without a positive bronchodilator response, normal TLC, normal diffusion capacity.  Review of Systems As per HPI Past Medical History:  Diagnosis Date   CAD (coronary artery disease)    a. s/p 2 CoStar DES to RCA 06/2004. b.  LHC 12/09:  EF 65%, oCFX 40%, oRCA 30%, then 30-40% leading into stented seg that is patent, (10-20% ISR), 40% beyond stent,;  c. EF 81%, poor exercise capacity, hypotensive BP response, + chest pain, normal nuclear images (overall low risk)   CKD (chronic kidney disease)    Colon polyps    a. s/p removal 01/2012 - was told one might be cancerous but was not diagnosed with colon cancer - plan is for f/u colonoscopy in 5 yrs.   GERD (gastroesophageal reflux disease)    Hx of echocardiogram    a. Echo 06/2011: EF 55-60%, Gr 1 diast dysfn, mild MR   Insomnia    Mini stroke    History of, in 2005 (before she was diagnosed  with CAD).   Osteoporosis    Other and unspecified hyperlipidemia    Other isolated or specific phobias    Peripheral vascular disease, unspecified (HCC)    a. s/p L common femoral & left illiac stenting previously (patent by angio in 2007). - ABI 06/2011 normal. b. Carotid US 0-39% BICA 06/2011 (for f/u 2 yrs);   c.  ABIs 9/13:  normal (1.1 bilaterally)   Personal history of peptic ulcer disease    Remotely.   Psychiatric pseudoseizure    per patient and family   Tobacco use disorder    Type II or unspecified type diabetes mellitus without mention of complication, not stated as uncontrolled    Unspecified essential hypertension      Family History  Problem Relation Age of Onset   Heart attack Mother    Allergies Mother    Heart attack Father    Allergies Father    Kidney disease Father    Hypertension Father    Melanoma Sister    Coronary artery disease Brother    Breast cancer Maternal Aunt    Breast cancer Maternal Aunt    Breast cancer Paternal Aunt        d. under 66   Pancreatic cancer Maternal Grandmother    Breast cancer Daughter        BRCA1+     Social History  Socioeconomic History   Marital status: Widowed    Spouse name: Not on file   Number of children: 3   Years of education: 9   Highest education level: 9th grade  Occupational History   Occupation: Retired  Tobacco Use   Smoking status: Every Day    Current packs/day: 1.50    Average packs/day: 1.5 packs/day for 44.0 years (66.0 ttl pk-yrs)    Types: Cigarettes   Smokeless tobacco: Never   Tobacco comments:    12/29/18 1.5-2 ppd  Vaping Use   Vaping status: Not on file  Substance and Sexual Activity   Alcohol use: No    Comment: Recovering alcoholic. Sober for >30 years.   Drug use: No   Sexual activity: Not on file  Other Topics Concern   Not on file  Social History Narrative   Lives in [place] with her daughter, and is currently not working outside the home.    Caffeine- coffee 1 cup,  mtn dew      Social Determinants of Health   Financial Resource Strain: Not on file  Food Insecurity: No Food Insecurity (10/20/2022)   Hunger Vital Sign    Worried About Running Out of Food in the Last Year: Never true    Ran Out of Food in the Last Year: Never true  Transportation Needs: No Transportation Needs (10/20/2022)   PRAPARE - Administrator, Civil Service (Medical): No    Lack of Transportation (Non-Medical): No  Physical Activity: Not on file  Stress: Not on file  Social Connections: Not on file  Intimate Partner Violence: Not At Risk (10/20/2022)   Humiliation, Afraid, Rape, and Kick questionnaire    Fear of Current or Ex-Partner: No    Emotionally Abused: No    Physically Abused: No    Sexually Abused: No     Allergies  Allergen Reactions   Amoxicillin Hives and Nausea And Vomiting   Claritin [Loratadine] Other (See Comments)    Pt's daughter notified us of this allergy.   Insulins Nausea And Vomiting and Other (See Comments)    Patient unsure of name of insulin, will call us back   Vioxx [Rofecoxib] Hives   Celecoxib Hives and Nausea And Vomiting   Liraglutide Nausea And Vomiting   Propranolol Diarrhea and Nausea And Vomiting     Outpatient Medications Prior to Visit  Medication Sig Dispense Refill   aspirin EC 81 MG tablet Take 1 tablet (81 mg total) by mouth daily. 30 tablet 11   busPIRone (BUSPAR) 7.5 MG tablet Take 1 tablet by mouth in the morning, at noon, and at bedtime.     calcium carbonate (OS-CAL) 600 MG tablet Take 600 mg by mouth 3 (three) times daily with meals.     clopidogrel (PLAVIX) 75 MG tablet Take 1 tablet (75 mg total) by mouth daily.     divalproex (DEPAKOTE) 250 MG DR tablet Take 250 mg by mouth 2 (two) times daily.     docusate sodium (COLACE) 100 MG capsule Take 100 mg by mouth 2 (two) times daily.     donepezil (ARICEPT) 5 MG tablet Take 5 mg by mouth daily.     DULoxetine (CYMBALTA) 60 MG capsule Take 120 mg by mouth at  bedtime.      famotidine (PEPCID) 20 MG tablet Take 20 mg by mouth 2 (two) times daily.     fenofibrate (TRICOR) 145 MG tablet Take 145 mg by mouth daily.     fexofenadine (ALLEGRA)  180 MG tablet Take 180 mg by mouth daily.     fluconazole (DIFLUCAN) 100 MG tablet Take 1 tablet (100 mg total) by mouth daily. 10 tablet 0   gabapentin (NEURONTIN) 100 MG capsule Take 100 mg by mouth 2 (two) times daily.     hydrALAZINE (APRESOLINE) 50 MG tablet Take 1 tablet (50 mg total) by mouth 3 (three) times daily.     HYDROcodone-acetaminophen (NORCO) 5-325 MG tablet Take 1 tablet by mouth every 4 (four) hours as needed for moderate pain. 30 tablet 0   JARDIANCE 25 MG TABS tablet Take 25 mg by mouth daily.     LORazepam (ATIVAN) 0.5 MG tablet Take 1 tablet (0.5 mg total) by mouth at bedtime as needed for sleep. May take one tab prior to imaging studies due to anxiety 30 tablet 0   metoprolol tartrate (LOPRESSOR) 25 MG tablet Take 25 mg by mouth 2 (two) times daily.     nicotine (NICODERM CQ - DOSED IN MG/24 HOURS) 21 mg/24hr patch Place 1 patch (21 mg total) onto the skin daily. 28 patch 0   nitroGLYCERIN (NITROSTAT) 0.4 MG SL tablet Place 0.4 mg under the tongue every 5 (five) minutes as needed. For chest pain     ondansetron (ZOFRAN-ODT) 4 MG disintegrating tablet Take 4 mg by mouth every 6 (six) hours as needed.     pantoprazole (PROTONIX) 40 MG tablet Take 40 mg by mouth 2 (two) times daily.     potassium chloride (KLOR-CON M) 10 MEQ tablet Take 1 tablet (10 mEq total) by mouth 3 (three) times daily. 90 tablet 5   PROLIA 60 MG/ML SOSY injection Inject into the skin.     TRESIBA FLEXTOUCH 100 UNIT/ML FlexTouch Pen Inject into the skin.     valsartan-hydrochlorothiazide (DIOVAN-HCT) 320-25 MG tablet TAKE 1 TABLET BY ORAL ROUTE ONCE DAILY FOR HIGH BLOOD PRESSURE     No facility-administered medications prior to visit.         Objective:   Physical Exam Vitals:   03/25/23 0830  BP: 138/70  Pulse: 79   Temp: (!) 97 F (36.1 C)  TempSrc: Temporal  SpO2: 99%  Weight: 124 lb (56.2 kg)  Height: 5\' 1"  (1.549 m)   Gen: Pleasant elderly woman, well-nourished, in no distress,  normal affect  ENT: No lesions, poor hearing (does not have her hearing aids), mouth clear,  oropharynx clear, no postnasal drip  Neck: No JVD, no stridor  Lungs: No use of accessory muscles, distant, some scattered expiratory wheezes, no crackles  Cardiovascular: RRR, heart sounds normal, no murmur or gallops, no peripheral edema  Musculoskeletal: No deformities, no cyanosis or clubbing  Neuro: alert, awake, non focal  Skin: Warm, no lesions or rashes       Assessment & Plan:   Pulmonary nodule 1 cm or greater in diameter Solitary enlarging right upper lobe pulmonary nodule, groundglass, most consistent with a well-differentiated adenocarcinoma in a patient with heavy tobacco use.  Discussed options with her and we have decided to pursue navigational bronchoscopy.  Risk and benefits discussed.  She understands and wants to proceed  We reviewed your CT scan of the chest today. We will arrange for navigational bronchoscopy to evaluate a right upper lobe pulmonary nodule.  This will be done under general anesthesia as an outpatient at [facility] endoscopy.  You will need a designated driver and someone to watch you at home on that day after the procedure.  You will need to stop your  Plavix 5 days prior to the procedure and your aspirin 2 days prior to the procedure.  We will try to get this arranged for 04/19/2023. Follow with T Parrett in about 1 month Follow Dr. Delton Coombes in 3 months, sooner if you have problems.  COPD (chronic obstructive pulmonary disease) (HCC) She has exertional dyspnea, most certainly COPD.  She has been tried on bronchodilator before but has always discontinued these due to bad taste.  We can revisit possible BD therapy going forward.  She has oxygen as well, unclear when she was titrated but  she only uses it for sleep.  She does not use it in the day because she is smoking all through the day  Levy Pupa, MD, PhD 03/25/2023, 9:05 AM [facility] [phone] or if no answer before 7:00PM call [phone] For any issues after 7:00PM please call eLink [phone]

## 2023-03-25 NOTE — Assessment & Plan Note (Signed)
She has exertional dyspnea, most certainly COPD.  She has been tried on bronchodilator before but has always discontinued these due to bad taste.  We can revisit possible BD therapy going forward.  She has oxygen as well, unclear when she was titrated but she only uses it for sleep.  She does not use it in the day because she is smoking all through the day

## 2023-03-25 NOTE — Patient Instructions (Addendum)
We reviewed your CT scan of the chest today. We will arrange for navigational bronchoscopy to evaluate a right upper lobe pulmonary nodule.  This will be done under general anesthesia as an outpatient at [facility] endoscopy.  You will need a designated driver and someone to watch you at home on that day after the procedure.  You will need to stop your Plavix 5 days prior to the procedure and your aspirin 2 days prior to the procedure.  We will try to get this arranged for 04/19/2023. Work on decreasing your cigarettes if at all possible. We can talk about options for inhaler medication at some point in the future Follow with T Parrett in about 1 month Follow Dr. Delton Coombes in 3 months, sooner if you have problems.

## 2023-03-26 DIAGNOSIS — R911 Solitary pulmonary nodule: Secondary | ICD-10-CM | POA: Diagnosis not present

## 2023-03-26 DIAGNOSIS — Z79899 Other long term (current) drug therapy: Secondary | ICD-10-CM | POA: Diagnosis not present

## 2023-03-29 ENCOUNTER — Other Ambulatory Visit: Payer: Self-pay | Admitting: Emergency Medicine

## 2023-03-29 DIAGNOSIS — U071 COVID-19: Secondary | ICD-10-CM | POA: Diagnosis not present

## 2023-03-29 DIAGNOSIS — R911 Solitary pulmonary nodule: Secondary | ICD-10-CM

## 2023-03-29 NOTE — H&P (View-Only) (Signed)
Super D CT chest ordered

## 2023-03-29 NOTE — Progress Notes (Signed)
Super D CT chest ordered

## 2023-04-01 DIAGNOSIS — S46092A Other injury of muscle(s) and tendon(s) of the rotator cuff of left shoulder, initial encounter: Secondary | ICD-10-CM | POA: Diagnosis not present

## 2023-04-01 DIAGNOSIS — S42255A Nondisplaced fracture of greater tuberosity of left humerus, initial encounter for closed fracture: Secondary | ICD-10-CM | POA: Diagnosis not present

## 2023-04-01 DIAGNOSIS — S20212A Contusion of left front wall of thorax, initial encounter: Secondary | ICD-10-CM | POA: Diagnosis not present

## 2023-04-07 ENCOUNTER — Telehealth: Payer: Self-pay

## 2023-04-07 DIAGNOSIS — E876 Hypokalemia: Secondary | ICD-10-CM

## 2023-04-07 DIAGNOSIS — M25512 Pain in left shoulder: Secondary | ICD-10-CM | POA: Diagnosis not present

## 2023-04-07 NOTE — Telephone Encounter (Addendum)
Pt's daughter has called reporting her mom 's c/o fatigue. Lindsay Brooks wants to know if it is ok for her mom to take a B12 tablet?

## 2023-04-09 ENCOUNTER — Other Ambulatory Visit: Payer: Self-pay | Admitting: Oncology

## 2023-04-09 ENCOUNTER — Inpatient Hospital Stay: Payer: Medicare Other | Attending: Hematology and Oncology

## 2023-04-09 ENCOUNTER — Encounter: Payer: Self-pay | Admitting: Oncology

## 2023-04-09 DIAGNOSIS — Z853 Personal history of malignant neoplasm of breast: Secondary | ICD-10-CM | POA: Diagnosis not present

## 2023-04-09 DIAGNOSIS — D62 Acute posthemorrhagic anemia: Secondary | ICD-10-CM

## 2023-04-09 DIAGNOSIS — D509 Iron deficiency anemia, unspecified: Secondary | ICD-10-CM | POA: Diagnosis not present

## 2023-04-09 LAB — CBC WITH DIFFERENTIAL (CANCER CENTER ONLY)
Abs Immature Granulocytes: 0.01 10*3/uL (ref 0.00–0.07)
Basophils Absolute: 0 10*3/uL (ref 0.0–0.1)
Basophils Relative: 0 %
Eosinophils Absolute: 0.3 10*3/uL (ref 0.0–0.5)
Eosinophils Relative: 4 %
HCT: 37 % (ref 36.0–46.0)
Hemoglobin: 11.4 g/dL — ABNORMAL LOW (ref 12.0–15.0)
Immature Granulocytes: 0 %
Lymphocytes Relative: 25 %
Lymphs Abs: 1.7 10*3/uL (ref 0.7–4.0)
MCH: 28.3 pg (ref 26.0–34.0)
MCHC: 30.8 g/dL (ref 30.0–36.0)
MCV: 91.8 fL (ref 80.0–100.0)
Monocytes Absolute: 0.4 10*3/uL (ref 0.1–1.0)
Monocytes Relative: 6 %
Neutro Abs: 4.5 10*3/uL (ref 1.7–7.7)
Neutrophils Relative %: 65 %
Platelet Count: 217 10*3/uL (ref 150–400)
RBC: 4.03 MIL/uL (ref 3.87–5.11)
RDW: 20.1 % — ABNORMAL HIGH (ref 11.5–15.5)
WBC Count: 7 10*3/uL (ref 4.0–10.5)
nRBC: 0 % (ref 0.0–0.2)

## 2023-04-09 LAB — VITAMIN B12: Vitamin B-12: 361 pg/mL (ref 180–914)

## 2023-04-09 LAB — CMP (CANCER CENTER ONLY)
ALT: 14 U/L (ref 0–44)
AST: 16 U/L (ref 15–41)
Albumin: 3.5 g/dL (ref 3.5–5.0)
Alkaline Phosphatase: 66 U/L (ref 38–126)
Anion gap: 10 (ref 5–15)
BUN: 18 mg/dL (ref 8–23)
CO2: 23 mmol/L (ref 22–32)
Calcium: 8.4 mg/dL — ABNORMAL LOW (ref 8.9–10.3)
Chloride: 107 mmol/L (ref 98–111)
Creatinine: 1.08 mg/dL — ABNORMAL HIGH (ref 0.44–1.00)
GFR, Estimated: 55 mL/min — ABNORMAL LOW (ref >60)
Glucose, Bld: 184 mg/dL — ABNORMAL HIGH (ref 70–99)
Potassium: 3.2 mmol/L — ABNORMAL LOW (ref 3.5–5.1)
Sodium: 140 mmol/L (ref 135–145)
Total Bilirubin: 0.3 mg/dL (ref 0.3–1.2)
Total Protein: 6.9 g/dL (ref 6.5–8.1)

## 2023-04-09 LAB — IRON AND TIBC
Iron: 32 ug/dL (ref 28–170)
Saturation Ratios: 8 % — ABNORMAL LOW (ref 10.4–31.8)
TIBC: 420 ug/dL (ref 250–450)
UIBC: 388 ug/dL

## 2023-04-09 LAB — MAGNESIUM: Magnesium: 2.3 mg/dL (ref 1.7–2.4)

## 2023-04-09 LAB — FOLATE: Folate: 9.9 ng/mL

## 2023-04-11 DIAGNOSIS — E785 Hyperlipidemia, unspecified: Secondary | ICD-10-CM | POA: Diagnosis not present

## 2023-04-11 DIAGNOSIS — E1169 Type 2 diabetes mellitus with other specified complication: Secondary | ICD-10-CM | POA: Diagnosis not present

## 2023-04-13 ENCOUNTER — Ambulatory Visit (HOSPITAL_COMMUNITY)
Admission: RE | Admit: 2023-04-13 | Discharge: 2023-04-13 | Disposition: A | Discharge status: Home / Self Care | Payer: Medicare Other | Source: Ambulatory Visit | Attending: Emergency Medicine | Admitting: Emergency Medicine

## 2023-04-13 ENCOUNTER — Ambulatory Visit: Payer: 59 | Admitting: Oncology

## 2023-04-13 ENCOUNTER — Other Ambulatory Visit: Payer: 59

## 2023-04-13 DIAGNOSIS — R911 Solitary pulmonary nodule: Secondary | ICD-10-CM | POA: Diagnosis not present

## 2023-04-13 DIAGNOSIS — J929 Pleural plaque without asbestos: Secondary | ICD-10-CM | POA: Diagnosis not present

## 2023-04-13 DIAGNOSIS — R918 Other nonspecific abnormal finding of lung field: Secondary | ICD-10-CM | POA: Diagnosis not present

## 2023-04-14 ENCOUNTER — Telehealth: Payer: Self-pay

## 2023-04-14 ENCOUNTER — Ambulatory Visit: Payer: 59 | Admitting: Oncology

## 2023-04-14 ENCOUNTER — Inpatient Hospital Stay: Payer: Medicare Other

## 2023-04-14 NOTE — Addendum Note (Signed)
Addended byHipolito Bayley on: 04/14/2023 11:25 AM   Modules accepted: Orders

## 2023-04-15 ENCOUNTER — Telehealth: Payer: Self-pay | Admitting: Emergency Medicine

## 2023-04-15 ENCOUNTER — Encounter: Payer: Self-pay | Admitting: Oncology

## 2023-04-15 NOTE — Telephone Encounter (Signed)
Patient has lost her preadmission papers for surgery and is unsure of her dos and donts. Please call and advise. [phone]. Her surgery is on the 9th of September.

## 2023-04-15 NOTE — Telephone Encounter (Signed)
Johny Drilling, can you help with this?

## 2023-04-15 NOTE — Telephone Encounter (Signed)
Lmam for the patient to call back

## 2023-04-15 NOTE — Telephone Encounter (Signed)
Opened in error

## 2023-04-16 ENCOUNTER — Other Ambulatory Visit: Payer: Self-pay

## 2023-04-16 ENCOUNTER — Encounter (HOSPITAL_COMMUNITY): Payer: Self-pay | Admitting: Emergency Medicine

## 2023-04-16 NOTE — Progress Notes (Signed)
I spoke with [facility] daughter, Horatio Pel, on Thursday, 04/15/23. Ms Mayford Knife takes care of patient's medication, Ms Mayford Knife is out of town and does not have her mother's medication list. I asked Ms Mayford Knife if Ms Koc would be able to read the medications bottles to someone from pharmacy, Ms Mayford Knife said , maybe, but if she gets anxious , she could have a seizure. I asked Ms Mayford Knife if Ms Eischeid had stopped Plavix and Aspirin, Ms Mayford Knife reported that the last dose for both was 04/14/23.  I instructed Ms Mayford Knife that Ms Treece should not take any more Jardiance after 04/15/23 dose.  Ms Mayford Knife instructed her mother and also had a friend go to Ms Ayub's house and check.  On Friday , September 6, I called Ms Mayford Knife an told h er that the medications had not been up dated. the mediation list is not updates, Ms. Mayford Knife said she will be home Saturday afternoon.    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

## 2023-04-19 ENCOUNTER — Encounter (HOSPITAL_COMMUNITY)
Admission: RE | Disposition: A | Discharge status: Home / Self Care | Payer: Self-pay | Source: Home / Self Care | Attending: Emergency Medicine

## 2023-04-19 ENCOUNTER — Ambulatory Visit (HOSPITAL_COMMUNITY)
Admission: RE | Admit: 2023-04-19 | Discharge: 2023-04-19 | Disposition: A | Discharge status: Home / Self Care | Payer: Medicare Other | Attending: Emergency Medicine | Admitting: Emergency Medicine

## 2023-04-19 ENCOUNTER — Ambulatory Visit (HOSPITAL_COMMUNITY): Payer: Medicare Other

## 2023-04-19 ENCOUNTER — Ambulatory Visit (HOSPITAL_COMMUNITY): Payer: Medicare Other | Admitting: Anesthesiology

## 2023-04-19 ENCOUNTER — Ambulatory Visit (HOSPITAL_BASED_OUTPATIENT_CLINIC_OR_DEPARTMENT_OTHER): Payer: Medicare Other | Admitting: Anesthesiology

## 2023-04-19 ENCOUNTER — Other Ambulatory Visit: Payer: Self-pay

## 2023-04-19 ENCOUNTER — Encounter (HOSPITAL_COMMUNITY): Payer: Self-pay | Admitting: Emergency Medicine

## 2023-04-19 DIAGNOSIS — I10 Essential (primary) hypertension: Secondary | ICD-10-CM | POA: Diagnosis not present

## 2023-04-19 DIAGNOSIS — I129 Hypertensive chronic kidney disease with stage 1 through stage 4 chronic kidney disease, or unspecified chronic kidney disease: Secondary | ICD-10-CM | POA: Insufficient documentation

## 2023-04-19 DIAGNOSIS — M810 Age-related osteoporosis without current pathological fracture: Secondary | ICD-10-CM | POA: Diagnosis not present

## 2023-04-19 DIAGNOSIS — Z87891 Personal history of nicotine dependence: Secondary | ICD-10-CM | POA: Diagnosis not present

## 2023-04-19 DIAGNOSIS — J449 Chronic obstructive pulmonary disease, unspecified: Secondary | ICD-10-CM | POA: Diagnosis not present

## 2023-04-19 DIAGNOSIS — I4510 Unspecified right bundle-branch block: Secondary | ICD-10-CM | POA: Insufficient documentation

## 2023-04-19 DIAGNOSIS — E876 Hypokalemia: Secondary | ICD-10-CM

## 2023-04-19 DIAGNOSIS — I700 Atherosclerosis of aorta: Secondary | ICD-10-CM | POA: Diagnosis not present

## 2023-04-19 DIAGNOSIS — M1990 Unspecified osteoarthritis, unspecified site: Secondary | ICD-10-CM | POA: Diagnosis not present

## 2023-04-19 DIAGNOSIS — K219 Gastro-esophageal reflux disease without esophagitis: Secondary | ICD-10-CM | POA: Insufficient documentation

## 2023-04-19 DIAGNOSIS — E785 Hyperlipidemia, unspecified: Secondary | ICD-10-CM

## 2023-04-19 DIAGNOSIS — I2510 Atherosclerotic heart disease of native coronary artery without angina pectoris: Secondary | ICD-10-CM | POA: Insufficient documentation

## 2023-04-19 DIAGNOSIS — F17210 Nicotine dependence, cigarettes, uncomplicated: Secondary | ICD-10-CM | POA: Diagnosis not present

## 2023-04-19 DIAGNOSIS — N189 Chronic kidney disease, unspecified: Secondary | ICD-10-CM | POA: Insufficient documentation

## 2023-04-19 DIAGNOSIS — Z794 Long term (current) use of insulin: Secondary | ICD-10-CM | POA: Diagnosis not present

## 2023-04-19 DIAGNOSIS — Z7984 Long term (current) use of oral hypoglycemic drugs: Secondary | ICD-10-CM | POA: Diagnosis not present

## 2023-04-19 DIAGNOSIS — Z9981 Dependence on supplemental oxygen: Secondary | ICD-10-CM | POA: Diagnosis not present

## 2023-04-19 DIAGNOSIS — Z48813 Encounter for surgical aftercare following surgery on the respiratory system: Secondary | ICD-10-CM | POA: Diagnosis not present

## 2023-04-19 DIAGNOSIS — R911 Solitary pulmonary nodule: Secondary | ICD-10-CM

## 2023-04-19 DIAGNOSIS — E1122 Type 2 diabetes mellitus with diabetic chronic kidney disease: Secondary | ICD-10-CM | POA: Diagnosis not present

## 2023-04-19 DIAGNOSIS — C3411 Malignant neoplasm of upper lobe, right bronchus or lung: Secondary | ICD-10-CM | POA: Diagnosis not present

## 2023-04-19 HISTORY — DX: Chronic obstructive pulmonary disease, unspecified: J44.9

## 2023-04-19 HISTORY — PX: BRONCHIAL WASHINGS: SHX5105

## 2023-04-19 HISTORY — PX: BRONCHIAL BRUSHINGS: SHX5108

## 2023-04-19 HISTORY — DX: Unspecified osteoarthritis, unspecified site: M19.90

## 2023-04-19 HISTORY — DX: Transient cerebral ischemic attack, unspecified: G45.9

## 2023-04-19 HISTORY — PX: FIDUCIAL MARKER PLACEMENT: SHX6858

## 2023-04-19 HISTORY — DX: Anemia, unspecified: D64.9

## 2023-04-19 HISTORY — DX: Depression, unspecified: F32.A

## 2023-04-19 HISTORY — PX: BRONCHIAL NEEDLE ASPIRATION BIOPSY: SHX5106

## 2023-04-19 HISTORY — PX: BRONCHIAL BIOPSY: SHX5109

## 2023-04-19 LAB — GLUCOSE, CAPILLARY
Glucose-Capillary: 123 mg/dL — ABNORMAL HIGH (ref 70–99)
Glucose-Capillary: 119 mg/dL — ABNORMAL HIGH (ref 70–99)

## 2023-04-19 SURGERY — BRONCHOSCOPY, WITH BIOPSY USING ELECTROMAGNETIC NAVIGATION
Anesthesia: General

## 2023-04-19 MED ORDER — LIDOCAINE 2% (20 MG/ML) 5 ML SYRINGE
INTRAMUSCULAR | Status: DC | PRN
  Administered 2023-04-19: 60 mg via INTRAVENOUS

## 2023-04-19 MED ORDER — FENTANYL CITRATE (PF) 100 MCG/2ML IJ SOLN: 25.0000 ug | INTRAMUSCULAR | Status: DC | PRN

## 2023-04-19 MED ORDER — CHLORHEXIDINE GLUCONATE 0.12 % MT SOLN
15.0000 mL | Freq: Once | OROMUCOSAL | Status: AC
  Administered 2023-04-19: 15 mL via OROMUCOSAL
  Filled 2023-04-19: qty 15

## 2023-04-19 MED ORDER — LACTATED RINGERS IV SOLN: INTRAVENOUS | Status: DC

## 2023-04-19 MED ORDER — METOPROLOL TARTRATE 25 MG PO TABS
25.0000 mg | ORAL_TABLET | Freq: Once | ORAL | Status: AC
  Administered 2023-04-19: 25 mg via ORAL
  Filled 2023-04-19: qty 2

## 2023-04-19 MED ORDER — ACETAMINOPHEN 10 MG/ML IV SOLN: 1000.0000 mg | Freq: Once | INTRAVENOUS | Status: DC | PRN

## 2023-04-19 MED ORDER — ROCURONIUM BROMIDE 10 MG/ML (PF) SYRINGE
PREFILLED_SYRINGE | INTRAVENOUS | Status: DC | PRN
  Administered 2023-04-19: 40 mg via INTRAVENOUS

## 2023-04-19 MED ORDER — DEXAMETHASONE SODIUM PHOSPHATE 10 MG/ML IJ SOLN
INTRAMUSCULAR | Status: DC | PRN
  Administered 2023-04-19: 10 mg via INTRAVENOUS

## 2023-04-19 MED ORDER — POTASSIUM CHLORIDE CRYS ER 10 MEQ PO TBCR: 10.0000 meq | EXTENDED_RELEASE_TABLET | Freq: Two times a day (BID) | ORAL | Status: DC

## 2023-04-19 MED ORDER — PROPOFOL 10 MG/ML IV BOLUS
INTRAVENOUS | Status: DC | PRN
  Administered 2023-04-19: 100 mg via INTRAVENOUS

## 2023-04-19 MED ORDER — ONDANSETRON HCL 4 MG/2ML IJ SOLN
INTRAMUSCULAR | Status: DC | PRN
  Administered 2023-04-19: 4 mg via INTRAVENOUS

## 2023-04-19 MED ORDER — AMISULPRIDE (ANTIEMETIC) 5 MG/2ML IV SOLN: 10.0000 mg | Freq: Once | INTRAVENOUS | Status: DC | PRN

## 2023-04-19 MED ORDER — SUGAMMADEX SODIUM 200 MG/2ML IV SOLN
INTRAVENOUS | Status: DC | PRN
  Administered 2023-04-19: 200 mg via INTRAVENOUS

## 2023-04-19 MED ORDER — FENTANYL CITRATE (PF) 100 MCG/2ML IJ SOLN
INTRAMUSCULAR | Status: DC | PRN
Start: 2023-04-19 — End: 2023-04-19
  Administered 2023-04-19: 100 ug via INTRAVENOUS

## 2023-04-19 MED ORDER — ONDANSETRON HCL 4 MG/2ML IJ SOLN: 4.0000 mg | Freq: Once | INTRAMUSCULAR | Status: DC | PRN

## 2023-04-19 MED ORDER — PHENYLEPHRINE 80 MCG/ML (10ML) SYRINGE FOR IV PUSH (FOR BLOOD PRESSURE SUPPORT)
PREFILLED_SYRINGE | INTRAVENOUS | Status: DC | PRN
Start: 2023-04-19 — End: 2023-04-19
  Administered 2023-04-19: 80 ug via INTRAVENOUS
  Administered 2023-04-19: 160 ug via INTRAVENOUS

## 2023-04-19 SURGICAL SUPPLY — 1 items: superlock fiducial marker IMPLANT

## 2023-04-19 NOTE — Discharge Instructions (Signed)
Flexible Bronchoscopy, Care After This sheet gives you information about how to care for yourself after your test. Your doctor may also give you more specific instructions. If you have problems or questions, contact your doctor. Follow these instructions at home: Eating and drinking When your numbness is gone and your cough and gag reflexes have come back, you may: Eat only soft foods. Slowly drink liquids. The day after the test, go back to your normal diet. Driving Do not drive for 24 hours if you were given a medicine to help you relax (sedative). Do not drive or use heavy machinery while taking prescription pain medicine. General instructions  Take over-the-counter and prescription medicines only as told by your doctor. Return to your normal activities as told. Ask what activities are safe for you. Do not use any products that have nicotine or tobacco in them. This includes cigarettes and e-cigarettes. If you need help quitting, ask your doctor. Keep all follow-up visits as told by your doctor. This is important. It is very important if you had a tissue sample (biopsy) taken. Get help right away if: You have shortness of breath that gets worse. You get light-headed. You feel like you are going to pass out (faint). You have chest pain. You cough up: More than a little blood. More blood than before. Summary Do not eat or drink anything (not even water) for 2 hours after your test, or until your numbing medicine wears off. Do not use cigarettes. Do not use e-cigarettes. Get help right away if you have chest pain.  Please call our office for any questions or concerns.  [phone]. You can restart your Plavix and aspirin on 04/20/2023.  This information is not intended to replace advice given to you by your health care provider. Make sure you discuss any questions you have with your health care provider. Document Released: 05/24/2009 Document Revised: 07/09/2017 Document Reviewed:  08/14/2016 Elsevier Patient Education  2020 [employer].

## 2023-04-19 NOTE — Interval H&P Note (Signed)
History and Physical Interval Note:  04/19/2023 8:43 AM  Lindsay Brooks  has presented today for surgery, with the diagnosis of RIGHT UPPER LOPE NODULE.  The various methods of treatment have been discussed with the patient and family. After consideration of risks, benefits and other options for treatment, the patient has consented to  Procedure(s): ROBOTIC ASSISTED NAVIGATIONAL BRONCHOSCOPY (N/A) as a surgical intervention.  The patient's history has been reviewed, patient examined, no change in status, stable for surgery. She last took her plavix 4 days ago. I did explain to her that there could be a slight increased risk of bleeding but I do not believe this precludes the procedure.  I have reviewed the patient's chart and labs.  Questions were answered to the patient's satisfaction.     Leslye Peer

## 2023-04-19 NOTE — Transfer of Care (Signed)
Immediate Anesthesia Transfer of Care Note  Patient: Lindsay Brooks  Procedure(s) Performed: ROBOTIC ASSISTED NAVIGATIONAL BRONCHOSCOPY BRONCHIAL NEEDLE ASPIRATION BIOPSIES BRONCHIAL BRUSHINGS BRONCHIAL WASHINGS BRONCHIAL BIOPSIES FIDUCIAL MARKER PLACEMENT  Patient Location: PACU  Anesthesia Type:General  Level of Consciousness: awake, alert , and oriented  Airway & Oxygen Therapy: Patient Spontanous Breathing  Post-op Assessment: Report given to RN and Post -op Vital signs reviewed and stable  Post vital signs: Reviewed and stable  Last Vitals:  Vitals Value Taken Time  BP 137/79 04/19/23 1045  Temp 37.2 C 04/19/23 1040  Pulse 76 04/19/23 1049  Resp 23 04/19/23 1049  SpO2 96 % 04/19/23 1049  Vitals shown include unfiled device data.  Last Pain:  Vitals:   04/19/23 1040  TempSrc:   PainSc: 0-No pain      Patients Stated Pain Goal: 0 (04/19/23 0743)  Complications: No notable events documented.

## 2023-04-19 NOTE — Op Note (Signed)
Video Bronchoscopy with Robotic Assisted Bronchoscopic Navigation   Date of Operation: 04/19/2023   Pre-op Diagnosis: Right upper lobe nodule  Post-op Diagnosis: Same  Surgeon: Levy Pupa  Assistants: None  Anesthesia: General endotracheal anesthesia  Operation: Flexible video fiberoptic bronchoscopy with robotic assistance and biopsies.  Estimated Blood Loss: Minimal  Complications: None  Indications and History: Lindsay Brooks is a 72 y.o. female with history of tobacco use.  She has a slowly enlarging groundglass right upper lobe pulmonary nodule suspicious for possible malignancy.  Recommendation made to achieve tissue diagnosis via robotic assisted navigational bronchoscopy. The risks, benefits, complications, treatment options and expected outcomes were discussed with the patient.  The possibilities of pneumothorax, pneumonia, reaction to medication, pulmonary aspiration, perforation of a viscus, bleeding, failure to diagnose a condition and creating a complication requiring transfusion or operation were discussed with the patient who freely signed the consent.    Description of Procedure: The patient was seen in the Preoperative Area, was examined and was deemed appropriate to proceed.  The patient was taken to [facility] endoscopy room 3, identified as Lindsay Brooks and the procedure verified as Flexible Video Fiberoptic Bronchoscopy.  A Time Out was held and the above information confirmed.   Prior to the date of the procedure a high-resolution CT scan of the chest was performed. Utilizing ION software program a virtual tracheobronchial tree was generated to allow the creation of distinct navigation pathways to the patient's parenchymal abnormalities. After being taken to the operating room general anesthesia was initiated and the patient  was orally intubated. The video fiberoptic bronchoscope was introduced via the endotracheal tube and a general inspection was performed which  showed normal right and left lung anatomy. Aspiration of the bilateral mainstems was completed to remove any remaining secretions. Robotic catheter inserted into patient's endotracheal tube.   Target #1 right upper lobe groundglass pulmonary nodule: The distinct navigation pathways prepared prior to this procedure were then utilized to navigate to patient's lesion identified on CT scan. The robotic catheter was secured into place and the vision probe was withdrawn.  Lesion location was approximated using fluoroscopy.  Local registration and targeting was performed using Cios three-dimensional imaging. Under fluoroscopic guidance transbronchial needle brushings, transbronchial needle biopsies, and transbronchial forceps biopsies were performed to be sent for cytology and pathology.  Under fluoroscopic guidance a single fiducial marker was placed adjacent to the nodule.  A bronchioalveolar lavage was performed in the right upper lobe and sent for cytology.  At the end of the procedure a general airway inspection was performed and there was no evidence of active bleeding. The bronchoscope was removed.  The patient tolerated the procedure well. There was no significant blood loss and there were no obvious complications. A post-procedural chest x-ray is pending.  Samples Target #1: 1. Transbronchial needle brushings from right upper lobe nodule 2. Transbronchial Wang needle biopsies from right upper lobe nodule 3. Transbronchial forceps biopsies from right upper lobe nodule 4. Bronchoalveolar lavage from right upper lobe    Plans:  The patient will be discharged from the PACU to home when recovered from anesthesia and after chest x-ray is reviewed. We will review the cytology, pathology and microbiology results with the patient when they become available. Outpatient followup will be with Dr. Delton Coombes.   Levy Pupa, MD, PhD 04/19/2023, 10:37 AM [facility] [phone] or if no  answer before 7:00PM call [phone] For any issues after 7:00PM please call eLink [phone]

## 2023-04-19 NOTE — Anesthesia Procedure Notes (Signed)
Procedure Name: Intubation Date/Time: 04/19/2023 9:55 AM  Performed by: Gwenyth Allegra, CRNAPre-anesthesia Checklist: Patient identified, Emergency Drugs available, Suction available, Patient being monitored and Timeout performed Patient Re-evaluated:Patient Re-evaluated prior to induction Oxygen Delivery Method: Circle system utilized Preoxygenation: Pre-oxygenation with 100% oxygen Induction Type: IV induction Ventilation: Mask ventilation without difficulty Laryngoscope Size: 4, Mac and 3 Tube type: Oral Tube size: 8.5 mm Number of attempts: 1 Airway Equipment and Method: Stylet and Patient positioned with wedge pillow Placement Confirmation: positive ETCO2, breath sounds checked- equal and bilateral and ETT inserted through vocal cords under direct vision Secured at: 21 cm Tube secured with: Tape Dental Injury: Teeth and Oropharynx as per pre-operative assessment

## 2023-04-19 NOTE — Anesthesia Preprocedure Evaluation (Signed)
Anesthesia Evaluation  Patient identified by MRN, date of birth, ID band Patient awake    Reviewed: Allergy & Precautions, NPO status , Patient's Chart, lab work & pertinent test results  Airway Mallampati: III  TM Distance: >3 FB Neck ROM: Full    Dental  (+) Edentulous Upper, Edentulous Lower   Pulmonary COPD,  oxygen dependent, Current Smoker and Patient abstained from smoking.   Pulmonary exam normal        Cardiovascular hypertension, Pt. on medications and Pt. on home beta blockers + angina  + CAD, + Cardiac Stents and + Peripheral Vascular Disease  Normal cardiovascular exam     Neuro/Psych Seizures -,  PSYCHIATRIC DISORDERS Anxiety Depression    TIA   GI/Hepatic Neg liver ROS,GERD  Medicated and Controlled,,  Endo/Other  diabetes, Insulin Dependent, Oral Hypoglycemic Agents    Renal/GU Renal disease     Musculoskeletal  (+) Arthritis ,    Abdominal   Peds  Hematology  (+) Blood dyscrasia (Plavix)   Anesthesia Other Findings RIGHT UPPER LOPE NODULE  Reproductive/Obstetrics                              Anesthesia Physical Anesthesia Plan  ASA: 4  Anesthesia Plan: General   Post-op Pain Management:    Induction: Intravenous  PONV Risk Score and Plan: 2 and Ondansetron, Dexamethasone, Propofol infusion, Midazolam and Treatment may vary due to age or medical condition  Airway Management Planned: Oral ETT  Additional Equipment:   Intra-op Plan:   Post-operative Plan: Extubation in OR  Informed Consent: I have reviewed the patients History and Physical, chart, labs and discussed the procedure including the risks, benefits and alternatives for the proposed anesthesia with the patient or authorized representative who has indicated his/her understanding and acceptance.     Dental advisory given  Plan Discussed with: CRNA  Anesthesia Plan Comments:           Anesthesia Quick Evaluation

## 2023-04-20 ENCOUNTER — Ambulatory Visit: Payer: Self-pay | Admitting: Oncology

## 2023-04-20 ENCOUNTER — Other Ambulatory Visit

## 2023-04-20 LAB — GLUCOSE, CAPILLARY: Glucose-Capillary: 105 mg/dL — ABNORMAL HIGH (ref 70–99)

## 2023-04-20 LAB — CYTOLOGY - NON PAP

## 2023-04-20 NOTE — Anesthesia Postprocedure Evaluation (Signed)
Anesthesia Post Note  Patient: Lindsay Brooks  Procedure(s) Performed: ROBOTIC ASSISTED NAVIGATIONAL BRONCHOSCOPY BRONCHIAL NEEDLE ASPIRATION BIOPSIES BRONCHIAL BRUSHINGS BRONCHIAL WASHINGS BRONCHIAL BIOPSIES FIDUCIAL MARKER PLACEMENT     Patient location during evaluation: PACU Anesthesia Type: General Level of consciousness: awake Pain management: pain level controlled Vital Signs Assessment: post-procedure vital signs reviewed and stable Respiratory status: spontaneous breathing, nonlabored ventilation and respiratory function stable Cardiovascular status: blood pressure returned to baseline and stable Postop Assessment: no apparent nausea or vomiting Anesthetic complications: no   No notable events documented.  Last Vitals:  Vitals:   04/19/23 1100 04/19/23 1110  BP: (!) 133/49 (!) 134/57  Pulse: 84 80  Resp: 20 19  Temp:  37.1 C  SpO2: 94% 93%    Last Pain:  Vitals:   04/19/23 1100  TempSrc:   PainSc: 0-No pain                 Ula Couvillon P Nuria Phebus

## 2023-04-22 ENCOUNTER — Inpatient Hospital Stay: Admitting: Oncology

## 2023-04-22 ENCOUNTER — Telehealth: Payer: Self-pay | Admitting: Emergency Medicine

## 2023-04-22 ENCOUNTER — Inpatient Hospital Stay: Attending: Hematology and Oncology

## 2023-04-22 DIAGNOSIS — C3490 Malignant neoplasm of unspecified part of unspecified bronchus or lung: Secondary | ICD-10-CM

## 2023-04-22 DIAGNOSIS — U071 COVID-19: Secondary | ICD-10-CM | POA: Diagnosis not present

## 2023-04-22 DIAGNOSIS — R0603 Acute respiratory distress: Secondary | ICD-10-CM | POA: Diagnosis not present

## 2023-04-22 NOTE — Telephone Encounter (Signed)
Called the patient's daughter to discuss bronchoscopy results.  Her biopsies, brushings show non-small cell lung cancer.  She has been seen by Dr. Gilman Buttner in the past.  There was no answer so I left a voicemail.  I will have to call them back.

## 2023-04-22 NOTE — Progress Notes (Incomplete)
[facility]  [address] [place],  [place]  [address] [phone]   Clinic Day: 04/22/23   Referring physician: [name], MD   ASSESSMENT & PLAN:  Stage IA Left breast cancer This is a grade 3 triple negative 21 mm tumor for a T1cN0M0 with a Ki-67 of 40%. She has had resection with clear margins and negative sentinel node. However, I recommended adjuvant chemotherapy with docetaxol and cyclophosphamide every 3 weeks for 4 cycles to avoid anthracyclines, completed 07/13/22. She completed adjuvant radiation in February, 2024.   Neuropathy She complains of numbness and pain of both legs and she feels unsteady. Some of this was present even prior to chemotherapy but did worsen with her treatment.    Family history of breast cancer and BRCA positivity Yet she is BRCA negative.   Lung nodule This is a ground glass nodule of the right upper lobe which has shown indolent growth from 1.8 cm in April of 2019 to 2.2 cm as of September of 2023. We will plan to monitor this with repeat imaging periodically.   Liver cirrhosis Liver is decreased in attenuation diffusely with mildly irregular margins. I don't think she was aware of this diagnosis but it may explain her increased toxicities.   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 She has these fairly regularly.    Memory impairment This is consistent with early dementia. She also has significant hearing impairment.   Hypokalemia CMP reveals a normal potassium of 4.0. She will continue potassium 10 meq TID.   Plan:  She has a upcoming consultation with Dr. Georgiana Shore about having her port removed. She continues to push fluids. Her labs today are pending. I will plan a CT of the chest when she returns as  her last one was September, 2023 and we need to follow up on the enlarging pulmonary nodule of the right upper lobe. I will see her  back in 3 months with CBC, CMP, and possible port flush if she still has her port.  The patient and her daughter agreed with the plan.  The patient was advised to call back if the symptoms worsen or if she has further concerns.   I provided 16 minutes of face-to-face time during this this encounter and > 50% was spent counseling as documented under my assessment and plan.    Dellia Beckwith, MD [facility] AT [facility] [address] [place] [address] Dept: [phone] Dept Fax: [phone]    CHIEF COMPLAINT:  CC: Invasive ductal carcinoma   Current Treatment:  surveillance  HISTORY OF PRESENT ILLNESS:       Oncology History  Breast cancer of upper-outer quadrant of left female breast (HCC)  03/18/2022 Initial Diagnosis    Breast cancer of upper-outer quadrant of left female breast (HCC)    04/28/2022 Cancer Staging    Staging form: Breast, AJCC 8th Edition - Clinical stage from 04/28/2022: Stage IB (cT1c, cN0(sn), cM0, G3, ER-, PR-, HER2-) - Signed by Dellia Beckwith, MD on 04/28/2022 Histopathologic type: Infiltrating duct carcinoma, NOS Stage prefix: Initial diagnosis Method of lymph node assessment: Sentinel lymph node biopsy Nuclear grade: G3 Multigene prognostic tests performed: None Histologic grading system: 3 grade system Laterality: Left Tumor size (mm): 20 Lymph-vascular invasion (LVI): LVI not present (absent)/not identified Diagnostic confirmation: Positive histology Specimen type: Excision Staged by: Managing physician Menopausal status: Postmenopausal Ki-67 (%):  40 Stage used in treatment planning: Yes National guidelines used in treatment planning: Yes Type of national guideline used in treatment planning: NCCN    05/07/2022 -  Chemotherapy    Patient is on Treatment Plan : BREAST TC q21d     05/19/2022 Genetic Testing    Negative hereditary cancer genetic testing: no pathogenic variant  detected in [facility] Common Hereditary Cancers +RNA Panel.  Report date is May 19, 2022.     The Common Hereditary Cancers + RNA Panel offered by [facility] includes sequencing, deletion/duplication, and RNA testing of the following 47 genes: APC, ATM, AXIN2, BARD1, BMPR1A, BRCA1, BRCA2, BRIP1, CDH1, CDK4*, CDKN2A (p14ARF)*, CDKN2A (p16INK4a)*, CHEK2, CTNNA1, DICER1, EPCAM (Deletion/duplication testing only), GREM1 (promoter region deletion/duplication testing only), KIT, MEN1, MLH1, MSH2, MSH3, MSH6, MUTYH, NBN, NF1, NHTL1, PALB2, PDGFRA*, PMS2, POLD1, POLE, PTEN, RAD50, RAD51C, RAD51D, SDHB, SDHC, SDHD, SMAD4, SMARCA4. STK11, TP53, TSC1, TSC2, and VHL.  The following genes were evaluated for sequence changes only: SDHA and HOXB13 c.251G>A variant only.  RNA analysis is not performed for the * genes.      Lindsay Brooks is a 72 year old woman who had a screening mammogram on 01/27/2022 and was found to have a possible left breast mass. She does get yearly mammograms due to her strong family history. She thought  she could feel a lump after this was found. She had a diagnostic mammogram on 02/11/2022 with a persistent spiculated hyperdense mass with associated calcifications in the upper inner quadrant. Ultrasound confirmed an irregular hypoechoic mass with peripheral vascularity at 11:30, 3 cm from the nipple. This measures 2.2 cm and the left axilla was negative. She was referred to [name] after a biopsy was done on 02/17/2022, which confirmed a grade 3 invasive ductal carcinoma which is triple negative with a Ki-67 of 40%. [name] took her to surgery on 03/18/2022 for lumpectomy and sentinel lymph node. The final pathology revealed a 20 mm grade 3 invasive ductal carcinoma with DCIS. The margins were clear and the lymph node was negative for a T1cN0M0, stage IA. She has had a port placed and had 4 cycles of TC chemotherapy, completed in December of 2023. She then had radiation, completed in February of 2024, but  required a great deal of supportive care.   INTERVAL HISTORY:  Lindsay Brooks is seen here today for invasive ductal carcinoma for continued supportive care after completing chemotherapy in December, 2023 and completing radiation in February, 2024. Patient states that she feels *** and ***.  Patient was admitted in the ER for a fall and was given a CT head done on 03/01/2023 that revealed no CT evidence of intracranial injury or acute fracture or traumatic malalignment of the cervical spine. However, she also had a CT chest, abdomen, and pelvis done on the same day which found grossly stable 2.6x1.8cm ground-glass density seen in the right upper lobe concerning for adenocarcinoma as noted on prior studies. Thoracic surgery consultation and a PET scan was then recommended. She was given a bronchoscopy and pathology found atypical cell present in the lung and rare atypical cells consistent with non-small cell carcinoma.   She had a PET scan done on 04/03/2023 that revealed a 2.5cm sub-solid nodule in the central right upper lobe, suspicious for primary bronchogenic carcinoma, specifically semi invasive adenocarcinoma, radiation changes involving the left breast and anterior left upper lobe, multiple left anterolateral rib fracture deformities, including a mildly displaced left lateral 9th rib, and a hypermetabolic 12mm right isthmic thyroid nodule.  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  She denies signs of infection such as sore throat, sinus  drainage, cough, or urinary symptoms.  She denies fevers or recurrent chills. She denies pain. She denies nausea, vomiting, chest pain, dyspnea or cough. Her appetite is *** and her weight {Weight change:10426}.  Patient states that she feels well and complains of her knees aching. She has a upcoming consultation with Dr. Georgiana Shore about having her port removed. She continues to push fluids. Her labs today are pending. I will see her back in 3 months with CBC, CMP, and possible port flush if she still has her port. We will also get a repeat CT chest to follow up on an enlarging nodule prior to her next appointment. She denies signs of infection such as sore throat, sinus drainage, cough, or urinary symptoms.  She denies fevers or recurrent chills. She denies pain. She denies nausea, vomiting, chest pain, dyspnea or cough. Her appetite is good and her weight has increased 3 pounds over last 2.5 weeks . She is accompanied at today's visit by her daughter.   HISTORY:  SOCIAL HISTORY She lives with her first daughter. She has 2 daughters. She smokes 1.5 packs a day, and has been smoking since 72 yo. She is not sure about quitting. She did smoke 2 packs per day for over 50 years. She doesn't drink other than rare occasion. She is widowed. She has been married 4 times. She is disabled.   Menstrual History 72 years old when she had her first child She has had 3 children 64 years old when starting menstrual period Menopause started at 76 She has had a partial hysterectomy in her 42's and went back for complete hysterectomy/BSO later. No hormone replacement therapy   Family History  Problem Relation Age of Onset   Heart attack Mother    Allergies Mother    Heart attack Father    Allergies Father    Kidney disease Father    Hypertension Father    Melanoma Sister    Coronary artery disease Brother    Breast cancer Maternal Aunt    Breast cancer Maternal Aunt    Breast cancer Paternal Aunt        d.  under 64   Pancreatic cancer Maternal Grandmother    Breast cancer Daughter        BRCA1+  Her sister had a brain tumor at age 39    Past Surgical History:  Procedure Laterality Date   APPENDECTOMY     BREAST BIOPSY     CARDIAC CATHETERIZATION  2009   patent RCA stent. mild CAD otherwise.    CATARACT EXTRACTION, BILATERAL  2016   CHOLECYSTECTOMY  2009   FLEXIBLE SIGMOIDOSCOPY N/A 10/21/2022   Procedure: FLEXIBLE SIGMOIDOSCOPY;  Surgeon: Charna Elizabeth, MD;  Location: [facility];  Service: Gastroenterology;  Laterality: N/A;   hysterectomy (other)     age 67   KNEE ARTHROSCOPY Left    left arm surgery     LEFT HEART CATHETERIZATION WITH CORONARY ANGIOGRAM N/A  01/16/2014   Procedure: LEFT HEART CATHETERIZATION WITH CORONARY ANGIOGRAM;  Surgeon: Micheline Chapman, MD;  Location: [facility] CATH LAB;  Service: Cardiovascular;  Laterality: N/A;   left leg angiography     stenting of the left common iliac artery   left rotator cuff repair     right coronary artery stent     Past Medical History:  Diagnosis Date   Anemia    Arthritis    CAD (coronary artery disease)    a. s/p 2 CoStar DES to RCA 06/2004. b.  LHC 12/09:  EF 65%, oCFX 40%, oRCA 30%, then 30-40% leading into stented seg that is patent, (10-20% ISR), 40% beyond stent,;  c. EF 81%, poor exercise capacity, hypotensive BP response, + chest pain, normal nuclear images (overall low risk)   CKD (chronic kidney disease)    Colon polyps    a. s/p removal 01/2012 - was told one might be cancerous but was not diagnosed with colon cancer - plan is for f/u colonoscopy in 5 yrs.   COPD (chronic obstructive pulmonary disease) (HCC)    Depression    GERD (gastroesophageal reflux disease)    Hx of echocardiogram    a. Echo 06/2011: EF 55-60%, Gr 1 diast dysfn, mild MR   Insomnia    Mini stroke    History of, in 2005 (before she was diagnosed with CAD).   Osteoporosis    Other and unspecified hyperlipidemia    Other isolated or specific  phobias    Peripheral vascular disease, unspecified (HCC)    a. s/p L common femoral & left illiac stenting previously (patent by angio in 2007). - ABI 06/2011 normal. b. Carotid US 0-39% BICA 06/2011 (for f/u 2 yrs);   c.  ABIs 9/13:  normal (1.1 bilaterally)   Personal history of peptic ulcer disease    Remotely.   Psychiatric pseudoseizure    per patient and family   TIA (transient ischemic attack)    Tobacco use disorder    Type II or unspecified type diabetes mellitus without mention of complication, not stated as uncontrolled    Unspecified essential hypertension    REVIEW OF SYSTEMS:  Review of Systems  Constitutional:  Positive for fatigue (mild). Negative for appetite change, chills, diaphoresis, fever and unexpected weight change.  HENT:   Positive for hearing loss. Negative for lump/mass, mouth sores, nosebleeds, sore throat, tinnitus, trouble swallowing and voice change.   Eyes: Negative.  Negative for eye problems and icterus.  Respiratory: Negative.  Negative for chest tightness, cough, hemoptysis, shortness of breath and wheezing.   Cardiovascular: Negative.  Negative for chest pain, leg swelling and palpitations.  Gastrointestinal: Negative.  Negative for abdominal distention, abdominal pain, blood in stool, constipation, diarrhea, nausea, rectal pain and vomiting.  Endocrine: Negative.   Genitourinary: Negative.  Negative for bladder incontinence, difficulty urinating, dyspareunia, dysuria, frequency, hematuria, menstrual problem, nocturia, pelvic pain, vaginal bleeding and vaginal discharge.   Musculoskeletal:  Positive for arthralgias. Negative for back pain, flank pain, gait problem, myalgias, neck pain and neck stiffness.       Knee pain  Skin: Negative.  Negative for itching, rash and wound.  Neurological: Negative.  Negative for dizziness, extremity weakness, gait problem, headaches, light-headedness, numbness, seizures and speech difficulty.  Hematological: Negative.   Negative for adenopathy. Does not bruise/bleed easily.  Psychiatric/Behavioral:  Positive for sleep disturbance. Negative for confusion, decreased concentration, depression and suicidal ideas. The patient is not nervous/anxious.    VITALS:  Blood pressure Marland Kitchen)  112/55, pulse (!) 119, temperature 97.8 F (36.6 C), temperature source Oral, resp. rate 20, height 5\' 1"  (1.549 m), weight 137 lb 1.6 oz (62.2 kg), SpO2 96 %.     Wt Readings from Last 3 Encounters:  06/24/22 140 lb (63.5 kg)  06/22/22 132 lb 1.3 oz (59.9 kg)  06/18/22 137 lb 1.6 oz (62.2 kg)    Body mass index is 25.9 kg/m.   Performance status (ECOG): 0 - Asymptomatic   PHYSICAL EXAM:  Physical Exam Vitals and nursing note reviewed. Exam conducted with a chaperone present.  Constitutional:      General: She is not in acute distress.    Appearance: Normal appearance. She is normal weight. She is not ill-appearing, toxic-appearing or diaphoretic.  HENT:     Head: Normocephalic and atraumatic.     Right Ear: Tympanic membrane, ear canal and external ear normal. There is no impacted cerumen.     Left Ear: Tympanic membrane, ear canal and external ear normal. There is no impacted cerumen.     Nose: Nose normal. No congestion or rhinorrhea.     Mouth/Throat:     Mouth: Mucous membranes are moist.     Pharynx: Oropharynx is clear. No oropharyngeal exudate or posterior oropharyngeal erythema.  Eyes:     General: No scleral icterus.       Right eye: No discharge.        Left eye: No discharge.     Extraocular Movements: Extraocular movements intact.     Conjunctiva/sclera: Conjunctivae normal.     Pupils: Pupils are equal, round, and reactive to light.  Neck:     Vascular: No carotid bruit.  Cardiovascular:     Rate and Rhythm: Normal rate and regular rhythm.     Pulses: Normal pulses.     Heart sounds: Normal heart sounds. No murmur heard.    No friction rub. No gallop.  Pulmonary:     Effort: Pulmonary effort is  normal. No respiratory distress.     Breath sounds: No stridor. Examination of the right-upper field reveals wheezing. Wheezing present. No rhonchi or rales.     Comments: Expiratory wheeze in the right upper lobe.  Chest:     Chest wall: No tenderness.     Comments: Port in the upper right chest Post radiation change of the left breast with some hyperpigmentation changes and small amount of edema of  her nipple/areolar complex and central breast.  Abdominal:     General: Bowel sounds are normal. There is no distension.     Palpations: Abdomen is soft. There is no hepatomegaly, splenomegaly or mass.     Tenderness: There is no abdominal tenderness. There is no right CVA tenderness, left CVA tenderness, guarding or rebound.     Hernia: No hernia is present.  Musculoskeletal:        General: No swelling, tenderness, deformity or signs of injury. Normal range of motion.     Cervical back: Normal range of motion and neck supple. No rigidity or tenderness.     Right lower leg: No edema.     Left lower leg: No edema.  Lymphadenopathy:     Cervical: No cervical adenopathy.     Right cervical: No superficial, deep or posterior cervical adenopathy.    Left cervical: No superficial, deep or posterior cervical adenopathy.     Upper Body:     Right upper body: No supraclavicular, axillary or pectoral adenopathy.     Left upper body: No  supraclavicular, axillary or pectoral adenopathy.  Skin:    General: Skin is warm and dry.     Coloration: Skin is not jaundiced or pale.     Findings: No bruising, erythema, lesion or rash.     Comments: Mild decreased skin trugor  Neurological:     General: No focal deficit present.     Mental Status: She is alert and oriented to person, place, and time. Mental status is at baseline.     Cranial Nerves: No cranial nerve deficit.     Sensory: No sensory deficit.     Motor: No weakness.     Coordination: Coordination normal.     Gait: Gait normal.     Deep  Tendon Reflexes: Reflexes normal.  Psychiatric:        Mood and Affect: Mood normal.        Behavior: Behavior normal.        Thought Content: Thought content normal.        Judgment: Judgment normal.    LABS:    Component Ref Range & Units 13 d ago (04/09/23) 3 mo ago (01/12/23) 5 mo ago (11/12/22)  WBC Count 4.0 - 10.5 K/uL 7.0 8.7 6.8  RBC 3.87 - 5.11 MIL/uL 4.03 3.82 Low  3.72 Low   Hemoglobin 12.0 - 15.0 g/dL 40.9 Low  81.1 Low  91.4 Low   HCT 36.0 - 46.0 % 37.0 37.5 35.8 Low   MCV 80.0 - 100.0 fL 91.8 98.2 96.2  MCH 26.0 - 34.0 pg 28.3 29.6 30.9  MCHC 30.0 - 36.0 g/dL 78.2 95.6 21.3  RDW 08.6 - 15.5 % 20.1 High  14.4 17.9 High   Platelet Count 150 - 400 K/uL 217 230     Component Ref Range & Units 13 d ago (04/09/23) 3 mo ago (01/12/23) 3 mo ago (12/25/22) 4 mo ago (12/11/22)  Sodium 135 - 145 mmol/L 140 142 143 R 138 R  Potassium 3.5 - 5.1 mmol/L 3.2 Low  4.1 4.1 R 3.9 R  Chloride 98 - 111 mmol/L 107 110 107 R 106 R  CO2 22 - 32 mmol/L 23 22 25  Abnormal  R 25 Abnormal  R  Glucose, Bld 70 - 99 mg/dL 578 High  469 High  CM 175 R 136 R  BUN 8 - 23 mg/dL 18 18 18  R 29 Abnormal  R  Creatinine 0.44 - 1.00 mg/dL 6.29 High  5.28 1.0 R 1.3 Abnormal  R  Calcium 8.9 - 10.3 mg/dL 8.4 Low  6.7 Low     Total Protein 6.5 - 8.1 g/dL 6.9 7.0    Albumin 3.5 - 5.0 g/dL 3.5 3.5    AST 15 - 41 U/L 16 14 Low     ALT 0 - 44 U/L 14 9    Alkaline Phosphatase 38 - 126 U/L 66 77    Total Bilirubin 0.3 - 1.2 mg/dL 0.3 0.5    GFR, Estimated >60 mL/min 55 Low  >60 CM    Anion gap 5 - 15 10 10  CM          Component Ref Range & Units 13 d ago  Vitamin B-12 180 - 914 pg/mL 361        Component Ref Range & Units 13 d ago  Folate >5.9 ng/mL 9.9         Component Ref Range & Units 13 d ago 6 mo ago  Iron 28 - 170 ug/dL 32 99  TIBC 413 - 244  ug/dL 865 784  Saturation Ratios 10.4 - 31.8 % 8 Low  27  UIBC ug/dL 696 295 CM            Component Ref Range &  Units 13 d ago (04/09/23) 6 mo ago (10/22/22) 6 mo ago (10/21/22) 6 mo ago (10/20/22) 6 mo ago (10/19/22)  Magnesium 1.7 - 2.4 mg/dL 2.3 2.0 CM 1.8 CM 1.7 CM 1.8 CM     Component Ref Range & Units 2 wk ago (12/25/22) 1 mo ago (12/11/22) 1 mo ago (12/11/22) 2 mo ago (11/12/22) 2 mo ago (10/22/22) 2 mo ago (10/21/22) 2 mo ago (10/20/22)  Glucose 175  136 124 High  R, CM 88 R, CM 87 R, CM 74 R, CM  BUN 4 - 21 18  29  Abnormal  16 R 10 R 16 R 20 R  CO2 13 - 22 25 Abnormal   25 Abnormal  25 R 24 R 24 R 24 R  Creatinine 0.5 - 1.1 1.0  1.3 Abnormal  1.13 High  R 0.73 R 0.81 R 0.84 R  Potassium 3.5 - 5.1 mEq/L 4.1  3.9 4.6 R 3.9 R 3.1 Low  R 3.5 R  Sodium 137 - 147 143  138 140 R 141 R 139 R 140 R  Chloride 99 - 108 107  106 106 R 109 R 105 R 107 R   Component Ref Range & Units 2 wk ago (12/25/22) 1 mo ago (12/11/22) 2 mo ago (11/12/22) 2 mo ago (10/22/22) 2 mo ago (10/21/22) 2 mo ago (10/20/22) 2 mo ago (10/19/22)  Calcium 8.7 - 10.7 9.1 9.0 9.2 R 8.8 Low  R 8.4 Low  R 8.6 Low  R 8.3 Low  R  Albumin 3.5 - 5.0 3.9 4.0 3.9 R  3.0 Low  R 3.0 Low  R 3.3 Low  R     Component Ref Range & Units 2 wk ago (12/25/22) 1 mo ago (12/11/22) 2 mo ago (11/12/22) 2 mo ago (10/21/22) 2 mo ago (10/20/22) 2 mo ago (10/19/22) 3 mo ago (10/05/22)  Alkaline Phosphatase 25 - 125 94 99 72 R 62 R 63 R 63 R 76  ALT 7 - 35 U/L 9 11 16  R 11 R 11 R 9 R 12  AST 13 - 35 23 94 Abnormal  21 R 19 R 17 R 18 R 25  Bilirubin, Total 0.3 0.4     0.3     Component Ref Range & Units 12/03/2022  Magnesium 1.9 - 2.7 mg/dL 1.9    STUDIES:  Exam: 04/03/2023 PET Lung Cancer or Pulmonary Nodule (W/Low dose CT) Impression:  2.5cm sub-solid nodule in the central right upper lobe, suspicious for primary bronchogenic carcinoma, specifically semi invasive adenocarcinoma. No findings suspicious for metastatic disease Radiation changes involving the left breast and anterior left upper lobe. Multiple left anterolateral rib fracture  deformities, including a mildly displaced left lateral 9th rib. Hypermetabolic 12mm right isthmic thyroid nodule. Recommend thyroid US and biopsy  EXAM: 04/13/2023 CT CHEST WITHOUT CONTRAST IMPRESSION: Persistent semi-solid nodule centrally in the right upper lobe. Unchanged from the more recent examinations but only slightly increased going back to a study of March 2021. Recommend continued follow up to achieve 5 years of stability. Persistent juxtapleural nodular thickening/opacity in the left upper lobe. Recommend more short-term follow up this areas this has relatively new and did show some uptake on prior examination in 3 months. Progressive healing of the left-sided rib fractures. Stable small saccular  aneurysm from the aortic arch. Aortic Atherosclerosis (ICD10-I70.0).  EXAM: 10/19/2022 CT ABDOMEN AND PELVIS WITH CONTRAST IMPRESSION: 1. No CT evidence for acute intra-abdominal or pelvic abnormality. 2. Hepatic steatosis. 3. Aortic atherosclerosis.(ICD10-I70.0).   EXAM:05/05/22 NUCLEAR MEDICINE WHOLE BODY BONE SCAN IMPRESSION: Uptake at LEFT tibial metaphysis especially medially, nonspecific; radiographic correlation recommended to exclude metastatic focus.   Focal abnormal tracer uptake at the LEFT temporo-occipital calvarium, corresponding to and focus of abnormal marrow signal on prior MR head exams back to 2010, nonspecific but likely benign.   No additional scintigraphic findings to suggest osseous metastatic disease.   Diffuse calvarial uptake, favoring metabolic bone disease.    EXAM:05/05/22 CT CHEST WITH CONTRAST IMPRESSION: Ground glass nodule in the right upper lobe with a possible developing internal solid component. Lesion has shown indolent growth from 11/11/2017, at which time it measured approcimately 1.2 x 1.8 cm. Findings are worrisome for adenocarcinoma.  No definitive evidence of metastatic breast cancer. Steatotic cirrhotic liver     Allergies  Allergen  Reactions   Amoxicillin Hives and Nausea And Vomiting   Claritin [Loratadine] Other (See Comments)    Pt's daughter notified us of this allergy.   Insulins Nausea And Vomiting and Other (See Comments)    Patient unsure of name of insulin, will call us back   Vioxx [Rofecoxib] Hives   Celecoxib Hives and Nausea And Vomiting   Liraglutide Nausea And Vomiting   Propranolol Diarrhea and Nausea And Vomiting   Current Outpatient Medications on File Prior to Visit  Medication Sig Dispense Refill   acetaminophen (TYLENOL) 500 MG tablet Take 1,000 mg by mouth as needed for moderate pain.     amLODipine (NORVASC) 2.5 MG tablet Take 2.5 mg by mouth 2 (two) times daily.     aspirin EC 81 MG tablet Take 1 tablet (81 mg total) by mouth daily. 30 tablet 11   busPIRone (BUSPAR) 7.5 MG tablet Take 7.5 mg by mouth as needed (anxiety).     calcium carbonate (OS-CAL) 600 MG tablet Take 600 mg by mouth daily.     clopidogrel (PLAVIX) 75 MG tablet Take 1 tablet (75 mg total) by mouth daily.     divalproex (DEPAKOTE) 250 MG DR tablet Take 250 mg by mouth 2 (two) times daily.     donepezil (ARICEPT) 5 MG tablet Take 5 mg by mouth daily.     DULoxetine (CYMBALTA) 60 MG capsule Take 60 mg by mouth at bedtime.     ezetimibe (ZETIA) 10 MG tablet Take 10 mg by mouth daily.     famotidine (PEPCID) 20 MG tablet Take 20 mg by mouth 2 (two) times daily.     fexofenadine (ALLEGRA) 180 MG tablet Take 180 mg by mouth as needed for allergies.     hydrALAZINE (APRESOLINE) 50 MG tablet Take 50 mg by mouth in the morning and at bedtime.     JARDIANCE 25 MG TABS tablet Take 25 mg by mouth daily.     LANTUS SOLOSTAR 100 UNIT/ML Solostar Pen Inject 20 Units into the skin at bedtime.     metoprolol tartrate (LOPRESSOR) 25 MG tablet Take 25 mg by mouth 2 (two) times daily.     nitroGLYCERIN (NITROSTAT) 0.4 MG SL tablet Place 0.4 mg under the tongue every 5 (five) minutes as needed. For chest pain     pantoprazole (PROTONIX) 40 MG  tablet Take 40 mg by mouth 2 (two) times daily.     potassium chloride (KLOR-CON M) 10 MEQ tablet Take  1 tablet (10 mEq total) by mouth 2 (two) times daily.     PROLIA 60 MG/ML SOSY injection Inject 60 mg into the skin every 6 (six) months.     valsartan-hydrochlorothiazide (DIOVAN-HCT) 320-25 MG tablet TAKE 1 TABLET BY ORAL ROUTE ONCE DAILY FOR HIGH BLOOD PRESSURE     zonisamide (ZONEGRAN) 50 MG capsule Take 200 mg by mouth in the morning and at bedtime.     No current facility-administered medications on file prior to visit.     I,Jasmine M Lassiter,acting as a scribe for Dellia Beckwith, MD.,have documented all relevant documentation on the behalf of Dellia Beckwith, MD,as directed by  Dellia Beckwith, MD while in the presence of Dellia Beckwith, MD.

## 2023-04-22 NOTE — Telephone Encounter (Signed)
Lindsay Brooks daughter is returning Dr. Delton Coombes phone call. Lindsay Brooks phone number is [phone].

## 2023-04-23 NOTE — Telephone Encounter (Signed)
Called and spoke with pt, I did inform pt that Dr.Byrum did reach out and will return the call when he is available. Sending to Dr.Byrum as a fyi returned phone call

## 2023-04-23 NOTE — Telephone Encounter (Signed)
Spoke with the patient and her daughter by phone.  Explained that her biopsy showed non-small cell lung cancer.  She wants to follow-up with Dr. Gilman Buttner at [place] [facility] and we will work on getting that appointment made.  I will also order a PET scan and an MRI brain.

## 2023-04-23 NOTE — Telephone Encounter (Signed)
Daughter is calling back to get results. I let her know someone would reach out. Please call and advise. [phone].

## 2023-04-25 ENCOUNTER — Encounter (HOSPITAL_COMMUNITY): Payer: Self-pay | Admitting: Emergency Medicine

## 2023-04-27 ENCOUNTER — Encounter: Payer: Self-pay | Admitting: Hematology and Oncology

## 2023-04-27 ENCOUNTER — Inpatient Hospital Stay (INDEPENDENT_AMBULATORY_CARE_PROVIDER_SITE_OTHER): Admitting: Hematology and Oncology

## 2023-04-27 ENCOUNTER — Telehealth: Payer: Self-pay

## 2023-04-27 VITALS — BP 140/54 | HR 70 | Temp 98.5°F | Resp 20 | Ht 61.0 in | Wt 121.2 lb

## 2023-04-27 DIAGNOSIS — C50412 Malignant neoplasm of upper-outer quadrant of left female breast: Secondary | ICD-10-CM | POA: Diagnosis not present

## 2023-04-27 DIAGNOSIS — F445 Conversion disorder with seizures or convulsions: Secondary | ICD-10-CM | POA: Diagnosis not present

## 2023-04-27 DIAGNOSIS — M25512 Pain in left shoulder: Secondary | ICD-10-CM | POA: Diagnosis not present

## 2023-04-27 DIAGNOSIS — L02421 Furuncle of right axilla: Secondary | ICD-10-CM

## 2023-04-27 DIAGNOSIS — E041 Nontoxic single thyroid nodule: Secondary | ICD-10-CM | POA: Insufficient documentation

## 2023-04-27 DIAGNOSIS — Z171 Estrogen receptor negative status [ER-]: Secondary | ICD-10-CM | POA: Diagnosis not present

## 2023-04-27 DIAGNOSIS — C3410 Malignant neoplasm of upper lobe, unspecified bronchus or lung: Secondary | ICD-10-CM | POA: Insufficient documentation

## 2023-04-27 DIAGNOSIS — C3411 Malignant neoplasm of upper lobe, right bronchus or lung: Secondary | ICD-10-CM

## 2023-04-27 DIAGNOSIS — L02429 Furuncle of limb, unspecified: Secondary | ICD-10-CM | POA: Insufficient documentation

## 2023-04-27 NOTE — Assessment & Plan Note (Signed)
Patient was added to the schedule today as she felt a tender nodule in the right axilla. She has a 1.5 cm boil. This may need I&D, but will try warm compresses and Bactrim. If this worsens or does not resolve, the patient and her daughter know to contact us, so we can set up appointment with Dr. Georgiana Shore.

## 2023-04-27 NOTE — Assessment & Plan Note (Signed)
Reported by family. I witnessed another today as she was getting up off the examination table. This resolved with deep breathing.

## 2023-04-27 NOTE — Telephone Encounter (Addendum)
Pt's daughter, Dereck Ligas, called and requests to bring her mom by for [place] to look at area under right axilla. "Mom thinks everything is cancer. It is red blue area and is sore to touch".

## 2023-04-27 NOTE — Assessment & Plan Note (Signed)
12 mm right thyroid nodule hypermetabolic on PET in August.  Will request ultrasound-guided biopsy as recommended by radiology.

## 2023-04-27 NOTE — Assessment & Plan Note (Signed)
Stage IB triple negative breast cancer. She had difficulty tolerating chemotherapy with docetaxel/cyclophosphamide despite dose reduction.  She completed chemotherapy in December 2023.  She intermittently required IV fluids after completion of chemotherapy.  She received adjuvant radiation to the left breast completed in very 2024.  Bilateral diagnostic mammogram in June did not reveal any evidence of malignancy.  She remains without evidence of recurrence.  Will plan to see her back in October as scheduled.

## 2023-04-27 NOTE — Progress Notes (Signed)
[facility]  [address] [place],  [place]  [address] [phone]  Clinic Day:  04/27/2023  Referring physician: [name], MD  ASSESSMENT & PLAN:   Assessment & Plan: Boil, axilla Patient was added to the schedule today as she felt a tender nodule in the right axilla. She has a 1.5 cm boil. This may need I&D, but will try warm compresses and Bactrim. If this worsens or does not resolve, the patient and her daughter know to contact us, so we can set up appointment with Dr. Georgiana Brooks.  Lung cancer, upper lobe (HCC) New diagnosis of non-small cell lung cancer of the right upper lobe. Clinical stage IB (T1b N0 M0).  CT chest, abdomen and pelvis done in the ER July 22 after a fall revealed a 25 x 22 mm right upper lobe groundglass nodule increased in size from September 2023.  There were central lobar micronodules in the right upper lobe, lingula and right middle lobe.  PET on August 16 revealed 2.5 cm subsolid nodule in the central right upper lobe, suspicious for primary bronchogenic carcinoma, specifically semi invasive adenocarcinoma. No findings suspicious for metastatic disease. Radiation changes involving the left breast and anterior left upper lobe. Multiple left anterolateral rib fracture deformities, including a mildly displaced left lateral 9th rib. Hypermetabolic 12 mm right isthmic thyroid nodule. Recommend thyroid US and biopsy. She was referred to Dr. Delton Brooks and underwent bronchoscopy on September 9.  Brushings revealed malignant cells consistent with non-small cell lung cancer.  He has recommended an MRI head and PET.  Unfortunately, she continues to smoke about 1-1/2 packs a day.  She remains weak since her breast cancer treatment.  Due to her multiple medical comorbidities, she is not likely to be a surgical candidate.  Dr. Delton Brooks has ordered a MRI brain.  I will request PFTs prior to considering referral to thoracic surgery.  She has an  appointment with Dr. Gilman Brooks on October 4 and she will keep that as scheduled.  Breast cancer of upper-outer quadrant of left female breast (HCC) Stage IB triple negative breast cancer. She had difficulty tolerating chemotherapy with docetaxel/cyclophosphamide despite dose reduction.  She completed chemotherapy in December 2023.  She intermittently required IV fluids after completion of chemotherapy.  She received adjuvant radiation to the left breast completed in very 2024.  Bilateral diagnostic mammogram in June did not reveal any evidence of malignancy.  She remains without evidence of recurrence.  Will plan to see her back in October as scheduled.    Psychosomatic seizure Reported by family. I witnessed another today as she was getting up off the examination table. This resolved with deep breathing.   Thyroid nodule greater than or equal to 1 cm in diameter incidentally noted on imaging study 12 mm right thyroid nodule hypermetabolic on PET in August.  Will request ultrasound-guided biopsy as recommended by radiology.   The patient understands the plans discussed today and is in agreement with them.  She knows to contact our office if she develops concerns prior to her next appointment.   I provided 40 minutes of face-to-face time during this encounter and > 50% was spent counseling as documented under my assessment and plan.    Lindsay Perl, PA-C  [facility] AT [facility] [address] [place] [address] Dept: [phone] Dept Fax: [phone]   No orders of the defined types were placed in this encounter.     CHIEF  COMPLAINT:  CC: Tender nodule on the right arm  Current Treatment:  Surveillance of breast cancer  HISTORY OF PRESENT ILLNESS:   Oncology History  Breast cancer of upper-outer quadrant of left female breast (HCC)  03/18/2022 Initial Diagnosis   Breast cancer of upper-outer quadrant of left female breast  (HCC)   04/28/2022 Cancer Staging   Staging form: Breast, AJCC 8th Edition - Clinical stage from 04/28/2022: Stage IB (cT1c, cN0(sn), cM0, G3, ER-, PR-, HER2-) - Signed by Dellia Beckwith, MD on 04/28/2022 Histopathologic type: Infiltrating duct carcinoma, NOS Stage prefix: Initial diagnosis Method of lymph node assessment: Sentinel lymph node biopsy Nuclear grade: G3 Multigene prognostic tests performed: None Histologic grading system: 3 grade system Laterality: Left Tumor size (mm): 20 Lymph-vascular invasion (LVI): LVI not present (absent)/not identified Diagnostic confirmation: Positive histology Specimen type: Excision Staged by: Managing physician Menopausal status: Postmenopausal Ki-67 (%): 40 Stage used in treatment planning: Yes National guidelines used in treatment planning: Yes Type of national guideline used in treatment planning: NCCN   05/07/2022 - 07/14/2022 Chemotherapy   Patient is on Treatment Plan : BREAST TC q21d     05/19/2022 Genetic Testing   Negative hereditary cancer genetic testing: no pathogenic variant detected in [facility] Common Hereditary Cancers +RNA Panel.  Report date is May 19, 2022.    The Common Hereditary Cancers + RNA Panel offered by [facility] includes sequencing, deletion/duplication, and RNA testing of the following 47 genes: APC, ATM, AXIN2, BARD1, BMPR1A, BRCA1, BRCA2, BRIP1, CDH1, CDK4*, CDKN2A (p14ARF)*, CDKN2A (p16INK4a)*, CHEK2, CTNNA1, DICER1, EPCAM (Deletion/duplication testing only), GREM1 (promoter region deletion/duplication testing only), KIT, MEN1, MLH1, MSH2, MSH3, MSH6, MUTYH, NBN, NF1, NHTL1, PALB2, PDGFRA*, PMS2, POLD1, POLE, PTEN, RAD50, RAD51C, RAD51D, SDHB, SDHC, SDHD, SMAD4, SMARCA4. STK11, TP53, TSC1, TSC2, and VHL.  The following genes were evaluated for sequence changes only: SDHA and HOXB13 c.251G>A variant only.  RNA analysis is not performed for the * genes.         INTERVAL HISTORY:  Lindsay Brooks is did to the  schedule with report of tender nodule under her right arm.  She denies fevers or chills.  She denies any changes in her breasts.  She reports continued fatigue.  She denies pain. Her appetite is fairly good. Her weight has decreased 16 pounds over last 3 months .  CT imaging in the ER in July revealed an increase in the right upper lobe nodule.  PET scan in August did not reveal any evidence of metastatic disease, however, there was a hypermetabolic right thyroid nodule.  She underwent bronchoscopy on September 9 findings of non-small cell lung cancer on bronchial brushings.  Further characterization of her cancer is not possible due to the malignant cells being on the brushings.  Unfortunately, she continues to smoke.  She states that she has tried smoking cessation on multiple occasions using nicotine replacement and Chantix without success.  REVIEW OF SYSTEMS:  Review of Systems  Constitutional:  Positive for fatigue and unexpected weight change. Negative for appetite change, chills and fever.  HENT:   Negative for lump/mass, mouth sores and sore throat.   Respiratory:  Negative for cough and shortness of breath.   Cardiovascular:  Negative for chest pain and leg swelling.  Gastrointestinal:  Negative for abdominal pain, constipation, diarrhea, nausea and vomiting.  Endocrine: Negative for hot flashes.  Genitourinary:  Negative for difficulty urinating, dysuria, frequency and hematuria.   Musculoskeletal:  Negative for arthralgias, back pain and myalgias.  Skin:  Positive for wound (  Tender nodule in the right axilla).  Neurological:  Negative for dizziness and headaches.  Hematological:  Negative for adenopathy. Does not bruise/bleed easily.  Psychiatric/Behavioral:  Positive for depression. Negative for sleep disturbance. The patient is nervous/anxious.      VITALS:  Blood pressure (!) 140/54, pulse 70, temperature 98.5 F (36.9 C), temperature source Oral, resp. rate 20, height 5\' 1"  (1.549  m), weight 121 lb 3.2 oz (55 kg), SpO2 100%.  Wt Readings from Last 3 Encounters:  04/27/23 121 lb 3.2 oz (55 kg)  04/19/23 125 lb (56.7 kg)  03/25/23 124 lb (56.2 kg)    Body mass index is 22.9 kg/m.  Performance status (ECOG): 2 - Symptomatic, <50% confined to bed  PHYSICAL EXAM:  Physical Exam Vitals and nursing note reviewed.  Constitutional:      General: She is not in acute distress.    Appearance: Normal appearance. She is ill-appearing (Chronically ill-appearing).     Comments: Ambulates on her own.  Difficulty getting on and off the examination table.  HENT:     Head: Normocephalic and atraumatic.     Mouth/Throat:     Mouth: Mucous membranes are moist.     Pharynx: Oropharynx is clear. No oropharyngeal exudate or posterior oropharyngeal erythema.  Eyes:     General: No scleral icterus.    Extraocular Movements: Extraocular movements intact.     Conjunctiva/sclera: Conjunctivae normal.     Pupils: Pupils are equal, round, and reactive to light.  Cardiovascular:     Rate and Rhythm: Normal rate and regular rhythm.     Heart sounds: Normal heart sounds. No murmur heard.    No friction rub. No gallop.  Pulmonary:     Effort: Pulmonary effort is normal.     Breath sounds: Normal breath sounds. No wheezing, rhonchi or rales.  Chest:  Breasts:    Right: Inverted nipple (Chronic, stable) present. No mass, nipple discharge or skin change.     Left: Normal. No inverted nipple, mass, nipple discharge or skin change.  Abdominal:     General: There is no distension.     Palpations: Abdomen is soft. There is no hepatomegaly, splenomegaly or mass.     Tenderness: There is no abdominal tenderness.  Musculoskeletal:        General: Normal range of motion.     Cervical back: Normal range of motion and neck supple. No tenderness.     Right lower leg: No edema.     Left lower leg: No edema.  Lymphadenopathy:     Cervical: No cervical adenopathy.     Upper Body:     Right  upper body: No supraclavicular or axillary adenopathy.     Left upper body: No supraclavicular or axillary adenopathy.     Lower Body: No right inguinal adenopathy. No left inguinal adenopathy.  Skin:    General: Skin is warm and dry.     Coloration: Skin is not jaundiced.     Findings: No rash.  Neurological:     Mental Status: She is alert and oriented to person, place, and time.     Cranial Nerves: No cranial nerve deficit.  Psychiatric:        Mood and Affect: Mood normal.        Behavior: Behavior normal.        Thought Content: Thought content normal.     LABS:      Latest Ref Rng & Units 04/09/2023    2:29 PM 01/12/2023  1:25 PM 12/25/2022   12:00 AM  CBC  WBC 4.0 - 10.5 K/uL 7.0  8.7  7.6      Hemoglobin 12.0 - 15.0 g/dL 84.1  32.4  40.1      Hematocrit 36.0 - 46.0 % 37.0  37.5  37      Platelets 150 - 400 K/uL 217  230  184         This result is from an external source.      Latest Ref Rng & Units 04/09/2023    2:29 PM 01/12/2023    1:25 PM 12/25/2022   12:00 AM  CMP  Glucose 70 - 99 mg/dL 027  253    BUN 8 - 23 mg/dL 18  18  18       Creatinine 0.44 - 1.00 mg/dL 6.64  4.03  1.0      Sodium 135 - 145 mmol/L 140  142  143      Potassium 3.5 - 5.1 mmol/L 3.2  4.1  4.1      Chloride 98 - 111 mmol/L 107  110  107      CO2 22 - 32 mmol/L 23  22  25       Calcium 8.9 - 10.3 mg/dL 8.4  6.7  9.1      Total Protein 6.5 - 8.1 g/dL 6.9  7.0    Total Bilirubin 0.3 - 1.2 mg/dL 0.3  0.5    Alkaline Phos 38 - 126 U/L 66  77  94      AST 15 - 41 U/L 16  14  23       ALT 0 - 44 U/L 14  9  9          This result is from an external source.     No results found for: "CEA1", "CEA" / No results found for: "CEA1", "CEA" No results found for: "PSA1" No results found for: "KVQ259" No results found for: "CAN125"  No results found for: "TOTALPROTELP", "ALBUMINELP", "A1GS", "A2GS", "BETS", "BETA2SER", "GAMS", "MSPIKE", "SPEI" Lab Results  Component Value Date   TIBC 420 04/09/2023    TIBC 361 10/20/2022   FERRITIN 11 10/20/2022   IRONPCTSAT 8 (L) 04/09/2023   IRONPCTSAT 27 10/20/2022   No results found for: "LDH"  STUDIES:  CT Super D Chest Wo Contrast  Result Date: 04/21/2023 CLINICAL DATA:  Pulmonary nodule EXAM: CT CHEST WITHOUT CONTRAST TECHNIQUE: Multidetector CT imaging of the chest was performed using thin slice collimation for electromagnetic bronchoscopy planning purposes, without intravenous contrast. RADIATION DOSE REDUCTION: This exam was performed according to the departmental dose-optimization program which includes automated exposure control, adjustment of the mA and/or kV according to patient size and/or use of iterative reconstruction technique. COMPARISON:  PET-CT 03/26/2019.  CT scan July 2024 and older. FINDINGS: Cardiovascular: On this non IV contrast exam, the heart is nonenlarged. Trace pericardial effusion. Coronary artery calcifications are seen. The thoracic aorta has a normal course and caliber with some vascular calcifications. There is a saccular outpouching from the left side of the aortic arch. Diameter of the aorta at this level in total measures 2.8 cm in the outpouching measures up to 11 mm. Unchanged from previous. Mediastinum/Nodes: Normal caliber thoracic esophagus. Small thyroid gland. On this non IV contrast exam there is no specific abnormal lymph node enlargement identified in the axillary regions, hilum or mediastinum. Lungs/Pleura: No pneumothorax or effusion. Apical pleural thickening with scarring and fibrotic changes. Punctate calcified juxtapleural nodule anteriorly left upper  lobe on series 4, image 40 is unchanged. There is some new juxtapleural nodularity or thickening along the left upper lobe anterolateral on series 4, image 58 measuring 18 by 6 mm. This was seen on the PET-CT scan with some abnormal uptake but not on the older CT scan. Unchanged from the recent PET-CT. Some patchy ill-defined nodular areas seen along the lingula  more inferiorly are stable. Few other tiny nodules in the left lower lobe. The semi-solid nodule centrally in the right upper lobe is again seen. On the prior examination this measured dimension up to 2.5 cm and today on series 4, image 57 when measured in the same fashion, 2.5 cm. Going back to an older examination of March 2021 this semi-solid nodule would have measured 2.1 cm. Upper Abdomen: Adrenal glands are preserved in the upper abdomen. Nodular contours of the liver. Previous cholecystectomy. Musculoskeletal: Progressive healing of left lateral rib fractures involving the lateral aspect of the left fourth and seventh ribs. More callus formation. These are not seen on the study of July 2024. stable left-sided breast skin and soft tissue thickening. IMPRESSION: Persistent semi-solid nodule centrally in the right upper lobe. Unchanged from the more recent examinations but only slightly increased going back to a study of March 2021. Recommend continued follow up to achieve 5 years of stability. Persistent juxtapleural nodular thickening/opacity in the left upper lobe. Recommend more short-term follow up this areas this has relatively new and did show some uptake on prior examination in 3 months. Progressive healing of the left-sided rib fractures. Stable small saccular aneurysm from the aortic arch. Aortic Atherosclerosis (ICD10-I70.0). Electronically Signed   By: Karen Kays M.D.   On: 04/21/2023 11:41   DG C-ARM BRONCHOSCOPY  Result Date: 04/19/2023 C-ARM BRONCHOSCOPY: Fluoroscopy was utilized by the requesting physician.  No radiographic interpretation.   DG Chest Port 1 View  Result Date: 04/19/2023 CLINICAL DATA:  Status post bronchoscopy. EXAM: PORTABLE CHEST 1 VIEW COMPARISON:  03/01/2019 x-ray FINDINGS: No pneumothorax or effusion. Normal cardiopericardial silhouette. Calcified aorta. There is a marker in the suprahilar region in the right hemithorax with some subtle nodular opacity. Overlapping  cardiac leads. IMPRESSION: No pneumothorax identified or effusion. Metallic marker right suprahilar with an ill-defined density. Please correlate for known location of the lesion Electronically Signed   By: Karen Kays M.D.   On: 04/19/2023 10:51      HISTORY:   Past Medical History:  Diagnosis Date   Anemia    Arthritis    CAD (coronary artery disease)    a. s/p 2 CoStar DES to RCA 06/2004. b.  LHC 12/09:  EF 65%, oCFX 40%, oRCA 30%, then 30-40% leading into stented seg that is patent, (10-20% ISR), 40% beyond stent,;  c. EF 81%, poor exercise capacity, hypotensive BP response, + chest pain, normal nuclear images (overall low risk)   CKD (chronic kidney disease)    Colon polyps    a. s/p removal 01/2012 - was told one might be cancerous but was not diagnosed with colon cancer - plan is for f/u colonoscopy in 5 yrs.   COPD (chronic obstructive pulmonary disease) (HCC)    Depression    GERD (gastroesophageal reflux disease)    Hx of echocardiogram    a. Echo 06/2011: EF 55-60%, Gr 1 diast dysfn, mild MR   Insomnia    Mini stroke    History of, in 2005 (before she was diagnosed with CAD).   Osteoporosis    Other and unspecified hyperlipidemia  Other isolated or specific phobias    Peripheral vascular disease, unspecified (HCC)    a. s/p L common femoral & left illiac stenting previously (patent by angio in 2007). - ABI 06/2011 normal. b. Carotid US 0-39% BICA 06/2011 (for f/u 2 yrs);   c.  ABIs 9/13:  normal (1.1 bilaterally)   Personal history of peptic ulcer disease    Remotely.   Psychiatric pseudoseizure    per patient and family   TIA (transient ischemic attack)    Tobacco use disorder    Type II or unspecified type diabetes mellitus without mention of complication, not stated as uncontrolled    Unspecified essential hypertension     Past Surgical History:  Procedure Laterality Date   APPENDECTOMY     BREAST BIOPSY     BRONCHIAL BIOPSY  04/19/2023   Procedure: BRONCHIAL  BIOPSIES;  Surgeon: Leslye Peer, MD;  Location: [facility] ENDOSCOPY;  Service: Pulmonary;;   BRONCHIAL BRUSHINGS  04/19/2023   Procedure: BRONCHIAL BRUSHINGS;  Surgeon: Leslye Peer, MD;  Location: [facility] ENDOSCOPY;  Service: Pulmonary;;   BRONCHIAL NEEDLE ASPIRATION BIOPSY  04/19/2023   Procedure: BRONCHIAL NEEDLE ASPIRATION BIOPSIES;  Surgeon: Leslye Peer, MD;  Location[facility] ENDOSCOPY;  Service: Pulmonary;;   BRONCHIAL WASHINGS  04/19/2023   Procedure: BRONCHIAL WASHINGS;  Surgeon: Leslye Peer, MD;  Location: [facility] ENDOSCOPY;  Service: Pulmonary;;   CARDIAC CATHETERIZATION  2009   patent RCA stent. mild CAD otherwise.    CATARACT EXTRACTION, BILATERAL  2016   CHOLECYSTECTOMY  2009   FIDUCIAL MARKER PLACEMENT  04/19/2023   Procedure: FIDUCIAL MARKER PLACEMENT;  Surgeon: Leslye Peer, MD;  Location[facility] ENDOSCOPY;  Service: Pulmonary;;   FLEXIBLE SIGMOIDOSCOPY N/A 10/21/2022   Procedure: Arnell Sieving;  Surgeon: Charna Elizabeth, MD;  Location: [facility] ENDOSCOPY;  Service: Gastroenterology;  Laterality: N/A;   hysterectomy (other)     age 34   KNEE ARTHROSCOPY Left    left arm surgery     LEFT HEART CATHETERIZATION WITH CORONARY ANGIOGRAM N/A 01/16/2014   Procedure: LEFT HEART CATHETERIZATION WITH CORONARY ANGIOGRAM;  Surgeon: Micheline Chapman, MD;  Location: [facility] CATH LAB;  Service: Cardiovascular;  Laterality: N/A;   left leg angiography     stenting of the left common iliac artery   left rotator cuff repair     right coronary artery stent      Family History  Problem Relation Age of Onset   Heart attack Mother    Allergies Mother    Heart attack Father    Allergies Father    Kidney disease Father    Hypertension Father    Melanoma Sister    Coronary artery disease Brother    Breast cancer Maternal Aunt    Breast cancer Maternal Aunt    Breast cancer Paternal Aunt        d. under 34   Pancreatic cancer Maternal Grandmother    Breast cancer Daughter        BRCA1+    Social  History:  reports that she has been smoking cigarettes. She has a 66 pack-year smoking history. She has never used smokeless tobacco. She reports that she does not drink alcohol and does not use drugs. The patient continues to smoke 1-1/2 packs/day.  The patient is accompanied by her daughter today.  Allergies:  Allergies  Allergen Reactions   Amoxicillin Hives and Nausea And Vomiting   Claritin [Loratadine] Other (See Comments)    Pt's daughter notified us of this  allergy.   Insulins Nausea And Vomiting and Other (See Comments)    Patient unsure of name of insulin, will call us back   Vioxx [Rofecoxib] Hives   Celecoxib Hives and Nausea And Vomiting   Liraglutide Nausea And Vomiting   Propranolol Diarrhea and Nausea And Vomiting    Current Medications: Current Outpatient Medications  Medication Sig Dispense Refill   acetaminophen (TYLENOL) 500 MG tablet Take 1,000 mg by mouth as needed for moderate pain.     amLODipine (NORVASC) 2.5 MG tablet Take 2.5 mg by mouth 2 (two) times daily.     aspirin EC 81 MG tablet Take 1 tablet (81 mg total) by mouth daily. 30 tablet 11   busPIRone (BUSPAR) 7.5 MG tablet Take 7.5 mg by mouth as needed (anxiety).     busPIRone (BUSPAR) 7.5 MG tablet Take 1 tablet by mouth daily.     calcium carbonate (OS-CAL) 600 MG tablet Take 600 mg by mouth daily.     clopidogrel (PLAVIX) 75 MG tablet Take 1 tablet (75 mg total) by mouth daily.     divalproex (DEPAKOTE) 250 MG DR tablet Take 250 mg by mouth 2 (two) times daily.     donepezil (ARICEPT) 5 MG tablet Take 5 mg by mouth daily.     DULoxetine (CYMBALTA) 60 MG capsule Take 60 mg by mouth at bedtime.     ezetimibe (ZETIA) 10 MG tablet Take 10 mg by mouth daily.     famotidine (PEPCID) 20 MG tablet Take 20 mg by mouth 2 (two) times daily.     fexofenadine (ALLEGRA) 180 MG tablet Take 180 mg by mouth as needed for allergies.     hydrALAZINE (APRESOLINE) 50 MG tablet Take 50 mg by mouth in the morning and at  bedtime.     JARDIANCE 25 MG TABS tablet Take 25 mg by mouth daily.     LANTUS SOLOSTAR 100 UNIT/ML Solostar Pen Inject 20 Units into the skin at bedtime.     metoprolol tartrate (LOPRESSOR) 25 MG tablet Take 25 mg by mouth 2 (two) times daily.     nitroGLYCERIN (NITROSTAT) 0.4 MG SL tablet Place 0.4 mg under the tongue every 5 (five) minutes as needed. For chest pain     pantoprazole (PROTONIX) 40 MG tablet Take 40 mg by mouth 2 (two) times daily.     potassium chloride (KLOR-CON M) 10 MEQ tablet Take 1 tablet (10 mEq total) by mouth 2 (two) times daily.     PROLIA 60 MG/ML SOSY injection Inject 60 mg into the skin every 6 (six) months.     valsartan-hydrochlorothiazide (DIOVAN-HCT) 320-25 MG tablet TAKE 1 TABLET BY ORAL ROUTE ONCE DAILY FOR HIGH BLOOD PRESSURE     zonisamide (ZONEGRAN) 50 MG capsule Take 200 mg by mouth in the morning and at bedtime.     No current facility-administered medications for this visit.

## 2023-04-27 NOTE — Assessment & Plan Note (Addendum)
New diagnosis of non-small cell lung cancer of the right upper lobe. Clinical stage IB (T1b N0 M0).  CT chest, abdomen and pelvis done in the ER July 22 after a fall revealed a 25 x 22 mm right upper lobe groundglass nodule increased in size from September 2023.  There were central lobar micronodules in the right upper lobe, lingula and right middle lobe.  PET on August 16 revealed 2.5 cm subsolid nodule in the central right upper lobe, suspicious for primary bronchogenic carcinoma, specifically semi invasive adenocarcinoma. No findings suspicious for metastatic disease. Radiation changes involving the left breast and anterior left upper lobe. Multiple left anterolateral rib fracture deformities, including a mildly displaced left lateral 9th rib. Hypermetabolic 12 mm right isthmic thyroid nodule. Recommend thyroid US and biopsy. She was referred to Dr. Delton Coombes and underwent bronchoscopy on September 9.  Brushings revealed malignant cells consistent with non-small cell lung cancer.  He has recommended an MRI head and PET.  Unfortunately, she continues to smoke about 1-1/2 packs a day.  She remains weak since her breast cancer treatment.  Due to her multiple medical comorbidities, she is not likely to be a surgical candidate.  Dr. Delton Coombes has ordered a MRI brain.  I will request PFTs prior to considering referral to thoracic surgery.  She has an appointment with Dr. Gilman Buttner on October 4 and she will keep that as scheduled.

## 2023-04-29 DIAGNOSIS — U071 COVID-19: Secondary | ICD-10-CM | POA: Diagnosis not present

## 2023-05-03 ENCOUNTER — Ambulatory Visit: Admitting: Adult Health

## 2023-05-04 ENCOUNTER — Other Ambulatory Visit: Payer: Self-pay | Admitting: Hematology and Oncology

## 2023-05-04 DIAGNOSIS — Z6822 Body mass index (BMI) 22.0-22.9, adult: Secondary | ICD-10-CM | POA: Diagnosis not present

## 2023-05-04 DIAGNOSIS — D62 Acute posthemorrhagic anemia: Secondary | ICD-10-CM | POA: Diagnosis not present

## 2023-05-04 DIAGNOSIS — K625 Hemorrhage of anus and rectum: Secondary | ICD-10-CM | POA: Diagnosis not present

## 2023-05-04 DIAGNOSIS — Z5321 Procedure and treatment not carried out due to patient leaving prior to being seen by health care provider: Secondary | ICD-10-CM | POA: Diagnosis not present

## 2023-05-04 DIAGNOSIS — C3490 Malignant neoplasm of unspecified part of unspecified bronchus or lung: Secondary | ICD-10-CM | POA: Diagnosis not present

## 2023-05-04 DIAGNOSIS — E041 Nontoxic single thyroid nodule: Secondary | ICD-10-CM

## 2023-05-05 DIAGNOSIS — I6782 Cerebral ischemia: Secondary | ICD-10-CM | POA: Diagnosis not present

## 2023-05-05 DIAGNOSIS — C3490 Malignant neoplasm of unspecified part of unspecified bronchus or lung: Secondary | ICD-10-CM | POA: Diagnosis not present

## 2023-05-06 DIAGNOSIS — E042 Nontoxic multinodular goiter: Secondary | ICD-10-CM | POA: Diagnosis not present

## 2023-05-06 DIAGNOSIS — R9389 Abnormal findings on diagnostic imaging of other specified body structures: Secondary | ICD-10-CM | POA: Diagnosis not present

## 2023-05-06 DIAGNOSIS — G43009 Migraine without aura, not intractable, without status migrainosus: Secondary | ICD-10-CM | POA: Diagnosis not present

## 2023-05-07 ENCOUNTER — Telehealth: Payer: Self-pay | Admitting: Hematology and Oncology

## 2023-05-07 NOTE — Telephone Encounter (Signed)
05/07/23 Lisa(IR)stated no thyroid biopsy needed-per thyroid ultrasound.Sent note to [employer].

## 2023-05-10 DIAGNOSIS — Z6822 Body mass index (BMI) 22.0-22.9, adult: Secondary | ICD-10-CM | POA: Diagnosis not present

## 2023-05-10 DIAGNOSIS — D649 Anemia, unspecified: Secondary | ICD-10-CM | POA: Diagnosis not present

## 2023-05-10 DIAGNOSIS — R197 Diarrhea, unspecified: Secondary | ICD-10-CM | POA: Diagnosis not present

## 2023-05-11 DIAGNOSIS — E1169 Type 2 diabetes mellitus with other specified complication: Secondary | ICD-10-CM | POA: Diagnosis not present

## 2023-05-11 DIAGNOSIS — E785 Hyperlipidemia, unspecified: Secondary | ICD-10-CM | POA: Diagnosis not present

## 2023-05-14 ENCOUNTER — Inpatient Hospital Stay: Payer: Medicare Other | Attending: Hematology and Oncology

## 2023-05-14 ENCOUNTER — Inpatient Hospital Stay (HOSPITAL_BASED_OUTPATIENT_CLINIC_OR_DEPARTMENT_OTHER): Payer: Medicare Other | Admitting: Oncology

## 2023-05-14 ENCOUNTER — Other Ambulatory Visit: Payer: Self-pay | Admitting: Oncology

## 2023-05-14 DIAGNOSIS — F17210 Nicotine dependence, cigarettes, uncomplicated: Secondary | ICD-10-CM | POA: Diagnosis not present

## 2023-05-14 DIAGNOSIS — J449 Chronic obstructive pulmonary disease, unspecified: Secondary | ICD-10-CM | POA: Diagnosis not present

## 2023-05-14 DIAGNOSIS — I10 Essential (primary) hypertension: Secondary | ICD-10-CM | POA: Diagnosis not present

## 2023-05-14 DIAGNOSIS — I4510 Unspecified right bundle-branch block: Secondary | ICD-10-CM | POA: Diagnosis not present

## 2023-05-14 DIAGNOSIS — C3411 Malignant neoplasm of upper lobe, right bronchus or lung: Secondary | ICD-10-CM | POA: Diagnosis not present

## 2023-05-14 DIAGNOSIS — Z743 Need for continuous supervision: Secondary | ICD-10-CM | POA: Diagnosis not present

## 2023-05-14 DIAGNOSIS — Z853 Personal history of malignant neoplasm of breast: Secondary | ICD-10-CM | POA: Insufficient documentation

## 2023-05-14 DIAGNOSIS — Z171 Estrogen receptor negative status [ER-]: Secondary | ICD-10-CM

## 2023-05-14 DIAGNOSIS — I959 Hypotension, unspecified: Secondary | ICD-10-CM | POA: Diagnosis not present

## 2023-05-14 DIAGNOSIS — I639 Cerebral infarction, unspecified: Secondary | ICD-10-CM | POA: Diagnosis not present

## 2023-05-14 DIAGNOSIS — I69354 Hemiplegia and hemiparesis following cerebral infarction affecting left non-dominant side: Secondary | ICD-10-CM | POA: Diagnosis not present

## 2023-05-14 DIAGNOSIS — R569 Unspecified convulsions: Secondary | ICD-10-CM | POA: Diagnosis not present

## 2023-05-14 DIAGNOSIS — R9431 Abnormal electrocardiogram [ECG] [EKG]: Secondary | ICD-10-CM | POA: Diagnosis not present

## 2023-05-14 DIAGNOSIS — R410 Disorientation, unspecified: Secondary | ICD-10-CM | POA: Diagnosis not present

## 2023-05-14 DIAGNOSIS — I2510 Atherosclerotic heart disease of native coronary artery without angina pectoris: Secondary | ICD-10-CM | POA: Diagnosis not present

## 2023-05-14 DIAGNOSIS — C50412 Malignant neoplasm of upper-outer quadrant of left female breast: Secondary | ICD-10-CM

## 2023-05-14 DIAGNOSIS — R55 Syncope and collapse: Secondary | ICD-10-CM | POA: Diagnosis not present

## 2023-05-14 DIAGNOSIS — R404 Transient alteration of awareness: Secondary | ICD-10-CM | POA: Diagnosis not present

## 2023-05-14 DIAGNOSIS — I252 Old myocardial infarction: Secondary | ICD-10-CM | POA: Diagnosis not present

## 2023-05-14 DIAGNOSIS — E119 Type 2 diabetes mellitus without complications: Secondary | ICD-10-CM | POA: Diagnosis not present

## 2023-05-14 NOTE — Progress Notes (Addendum)
[facility]  [address] [place],  [place]  [address] [phone]  Clinic Day: 05/14/23  Referring physician: [name], MD  ASSESSMENT & PLAN:   Assessment & Plan:  Pseudoseizure versus cerebrovascular accident The patient came for her appointment but had an episode prior to phlebotomy where she appeared unresponsive with eyes rolled back and unable to speak.  She still had a pulse and blood pressure and was placed on a stretcher.  EMS was called and her blood sugar was normal.  By that time she was starting to wake up but was still nonverbal.  They therefore called a code stroke and transported her to the emergency room immediately.  Lung cancer, upper lobe (HCC) New diagnosis of non-small cell lung cancer of the right upper lobe. Clinical stage IA3 (T1c N0 M0).  CT chest, abdomen and pelvis done in the ER July 22 after a fall revealed a 25 x 22 mm right upper lobe groundglass nodule increased in size from September 2023.  There were central lobar micronodules in the right upper lobe, lingula and right middle lobe.  PET on August 16 revealed 2.5 cm subsolid nodule in the central right upper lobe, suspicious for primary bronchogenic carcinoma, specifically semi invasive adenocarcinoma. No findings suspicious for metastatic disease. Radiation changes involving the left breast and anterior left upper lobe. Multiple left anterolateral rib fracture deformities, including a mildly displaced left lateral 9th rib. Hypermetabolic 12 mm right isthmic thyroid nodule. Recommend thyroid US and biopsy. She was referred to Dr. Delton Coombes and underwent bronchoscopy on September 9.  Brushings revealed malignant cells consistent with non-small cell lung cancer.  He has recommended an MRI head and PET.  Unfortunately, she continues to smoke about 1-1/2 packs a day.  She remains weak since her breast cancer treatment.  Due to her multiple medical comorbidities, she is not likely to  be a surgical candidate.  Dr. Delton Coombes has ordered a MRI brain.  I will request PFTs prior to considering referral to thoracic surgery.  The patient was here to discuss the lung cancer findings and was extremely anxious.  I do not know if that is what triggered this neurologic episode but she will be evaluated in the emergency room.  However I did discuss the status of the lung cancer with the patient and her daughter although I am not sure how much the patient understands.  Her PET scan does not show any evidence of metastatic disease and so this appears to be a stage I non-small cell lung cancer.  I explained we would normally approach this with surgery but I doubt she is a surgical candidate and so we will plan referral to radiation oncology at [facility] in [place] so she can be considered for stereotactic radiation.  The MRI of the brain was negative for metastatic disease.  The patient will need to be rescheduled for a future appointment in order to discuss this information and answer her questions.  Breast cancer of upper-outer quadrant of left female breast (HCC) Stage IB triple negative breast cancer. She had difficulty tolerating chemotherapy with docetaxel/cyclophosphamide despite dose reduction.  She completed chemotherapy in December 2023.  She intermittently required IV fluids after completion of chemotherapy.  She received adjuvant radiation to the left breast completed in very 2024.  Bilateral diagnostic mammogram in June did not reveal any evidence of malignancy.  She remains without evidence of recurrence.  Will plan to see her back in October as  scheduled.   Psychosomatic seizures Reported by family. I have witnessed these on several occasions at her appointments but she usually recovers with prompting from her daughter to take deep breaths.  This time she seemed much less responsive and did not come around with coaching from her daughter.  This event still may have  represented a pseudoseizure but she will be transported to the emergency room in order to have further evaluation.  The EMS personnel was informed of her case and the events of this morning.   Thyroid nodule greater than or equal to 1 cm in diameter incidentally noted on imaging study 12 mm right thyroid nodule hypermetabolic on PET in August.  Will request ultrasound-guided biopsy as recommended by radiology. The patient understands the plans discussed today and is in agreement with them.  She knows to contact our office if she develops concerns prior to her next appointment.   I provided 30 minutes of face-to-face time during this encounter and > 50% was spent counseling as documented under my assessment and plan.    Dellia Beckwith, MD  [facility] AT [facility] [address] [place] [address] Dept: [phone] Dept Fax: [phone]   No orders of the defined types were placed in this encounter.     CHIEF COMPLAINT:  CC: Tender nodule on the right arm  Current Treatment:  Surveillance of breast cancer  HISTORY OF PRESENT ILLNESS:   Oncology History  Breast cancer of upper-outer quadrant of left female breast (HCC)  03/18/2022 Initial Diagnosis   Breast cancer of upper-outer quadrant of left female breast (HCC)   04/28/2022 Cancer Staging   Staging form: Breast, AJCC 8th Edition - Clinical stage from 04/28/2022: Stage IB (cT1c, cN0(sn), cM0, G3, ER-, PR-, HER2-) - Signed by Dellia Beckwith, MD on 04/28/2022 Histopathologic type: Infiltrating duct carcinoma, NOS Stage prefix: Initial diagnosis Method of lymph node assessment: Sentinel lymph node biopsy Nuclear grade: G3 Multigene prognostic tests performed: None Histologic grading system: 3 grade system Laterality: Left Tumor size (mm): 20 Lymph-vascular invasion (LVI): LVI not present (absent)/not identified Diagnostic confirmation: Positive histology Specimen  type: Excision Staged by: Managing physician Menopausal status: Postmenopausal Ki-67 (%): 40 Stage used in treatment planning: Yes National guidelines used in treatment planning: Yes Type of national guideline used in treatment planning: NCCN   05/07/2022 - 07/14/2022 Chemotherapy   Patient is on Treatment Plan : BREAST TC q21d     05/19/2022 Genetic Testing   Negative hereditary cancer genetic testing: no pathogenic variant detected in [facility] Common Hereditary Cancers +RNA Panel.  Report date is May 19, 2022.    The Common Hereditary Cancers + RNA Panel offered by [facility] includes sequencing, deletion/duplication, and RNA testing of the following 47 genes: APC, ATM, AXIN2, BARD1, BMPR1A, BRCA1, BRCA2, BRIP1, CDH1, CDK4*, CDKN2A (p14ARF)*, CDKN2A (p16INK4a)*, CHEK2, CTNNA1, DICER1, EPCAM (Deletion/duplication testing only), GREM1 (promoter region deletion/duplication testing only), KIT, MEN1, MLH1, MSH2, MSH3, MSH6, MUTYH, NBN, NF1, NHTL1, PALB2, PDGFRA*, PMS2, POLD1, POLE, PTEN, RAD50, RAD51C, RAD51D, SDHB, SDHC, SDHD, SMAD4, SMARCA4. STK11, TP53, TSC1, TSC2, and VHL.  The following genes were evaluated for sequence changes only: SDHA and HOXB13 c.251G>A variant only.  RNA analysis is not performed for the * genes.         INTERVAL HISTORY:  Lindsay Brooks is seen here today for discussion of newly diagnosed early-stage non-small cell lung cancer of the right upper lobe.  She has been evaluated by Dr. Delton Coombes and  has had bronchoscopy.  This is clinical stage IA3 (T1c N0 M0).  CT chest, abdomen and pelvis done in the ER July 22 after a fall revealed a 25 x 22 mm right upper lobe groundglass nodule increased in size from September 2023.  There were central lobar micronodules in the right upper lobe, lingula and right middle lobe.  PET on August 16 revealed 2.5 cm subsolid nodule in the central right upper lobe, suspicious for primary bronchogenic carcinoma, specifically semi invasive adenocarcinoma. No  findings suspicious for metastatic disease. Radiation changes involving the left breast and anterior left upper lobe. Multiple left anterolateral rib fracture deformities, including a mildly displaced left lateral 9th rib. Hypermetabolic 12 mm right isthmic thyroid nodule. Recommend thyroid US and biopsy. She was referred to Dr. Delton Coombes and underwent bronchoscopy on September 9.  Brushings revealed malignant cells consistent with non-small cell lung cancer.  He has recommended an MRI head and PET.  Unfortunately, she continues to smoke about 1-1/2 packs a day.  She remains weak since her breast cancer treatment.  Due to her multiple medical comorbidities, she is not likely to be a surgical candidate.  Dr. Delton Coombes has ordered a MRI brain.  I will request PFTs prior to considering referral to thoracic surgery.  The patient was here to discuss the lung cancer findings and was extremely anxious.  She appeared to have a seizure and became unresponsive but we are not sure whether this represents a stroke or a pseudoseizure.  I do not know if that is what triggered this neurologic episode but she will be evaluated in the emergency room.  However I did discuss the status of the lung cancer with the patient and her daughter although I am not sure how much the patient understands.  Her repeat PET scan does not show any evidence of metastatic disease and so this appears to be a stage I non-small cell lung cancer.  I explained we would normally approach this with surgery but I doubt she is a surgical candidate and so we will plan referral to radiation oncology at [facility] in [place] so she can be considered for stereotactic radiation.  The MRI of the brain was negative for metastatic disease.  The patient will need to be rescheduled for a future appointment in order to discuss this information and answer her questions.  REVIEW OF SYSTEMS:  Review of Systems  Constitutional:  Positive for fatigue and  unexpected weight change. Negative for appetite change, chills and fever.  HENT:  Negative.  Negative for lump/mass, mouth sores and sore throat.   Eyes: Negative.   Respiratory: Negative.  Negative for chest tightness, cough, hemoptysis, shortness of breath and wheezing.   Cardiovascular: Negative.  Negative for chest pain, leg swelling and palpitations.  Gastrointestinal: Negative.  Negative for abdominal distention, abdominal pain, blood in stool, constipation, diarrhea, nausea and vomiting.  Endocrine: Negative.  Negative for hot flashes.  Genitourinary: Negative.  Negative for difficulty urinating, dysuria, frequency and hematuria.   Musculoskeletal:  Negative for arthralgias, back pain, flank pain, gait problem and myalgias.  Skin:  Positive for wound (Tender nodule in the right axilla).  Neurological:  Negative for dizziness, extremity weakness, gait problem, headaches, light-headedness, numbness, seizures and speech difficulty.  Hematological: Negative.  Negative for adenopathy. Does not bruise/bleed easily.  Psychiatric/Behavioral:  Positive for depression. Negative for sleep disturbance. The patient is nervous/anxious.      VITALS:  There were no vitals taken for this visit.  Wt Readings from Last 3 Encounters:  04/27/23 121 lb 3.2 oz (55 kg)  04/19/23 125 lb (56.7 kg)  03/25/23 124 lb (56.2 kg)    There is no height or weight on file to calculate BMI.  Performance status (ECOG): 2 - Symptomatic, <50% confined to bed  PHYSICAL EXAM:  Physical Exam Vitals and nursing note reviewed.  Constitutional:      General: She is not in acute distress.    Appearance: Normal appearance. She is ill-appearing (Chronically ill-appearing).     Comments: Ambulates on her own.  Difficulty getting on and off the examination table.  HENT:     Head: Normocephalic and atraumatic.     Mouth/Throat:     Mouth: Mucous membranes are moist.     Pharynx: Oropharynx is clear. No oropharyngeal  exudate or posterior oropharyngeal erythema.  Eyes:     General: No scleral icterus.    Extraocular Movements: Extraocular movements intact.     Conjunctiva/sclera: Conjunctivae normal.     Pupils: Pupils are equal, round, and reactive to light.  Cardiovascular:     Rate and Rhythm: Normal rate and regular rhythm.     Heart sounds: Normal heart sounds. No murmur heard.    No friction rub. No gallop.  Pulmonary:     Effort: Pulmonary effort is normal.     Breath sounds: Normal breath sounds. No wheezing, rhonchi or rales.  Chest:  Breasts:    Right: Inverted nipple (Chronic, stable) present. No mass, nipple discharge or skin change.     Left: Normal. No inverted nipple, mass, nipple discharge or skin change.  Abdominal:     General: There is no distension.     Palpations: Abdomen is soft. There is no hepatomegaly, splenomegaly or mass.     Tenderness: There is no abdominal tenderness.  Musculoskeletal:        General: Normal range of motion.     Cervical back: Normal range of motion and neck supple. No tenderness.     Right lower leg: No edema.     Left lower leg: No edema.  Lymphadenopathy:     Cervical: No cervical adenopathy.     Upper Body:     Right upper body: No supraclavicular or axillary adenopathy.     Left upper body: No supraclavicular or axillary adenopathy.     Lower Body: No right inguinal adenopathy. No left inguinal adenopathy.  Skin:    General: Skin is warm and dry.     Coloration: Skin is not jaundiced.     Findings: No rash.  Neurological:     Mental Status: She is alert and oriented to person, place, and time.     Cranial Nerves: No cranial nerve deficit.  Psychiatric:        Mood and Affect: Mood normal.        Behavior: Behavior normal.        Thought Content: Thought content normal.     LABS:      Latest Ref Rng & Units 04/09/2023    2:29 PM 01/12/2023    1:25 PM 12/25/2022   12:00 AM  CBC  WBC 4.0 - 10.5 K/uL 7.0  8.7  7.6      Hemoglobin  12.0 - 15.0 g/dL 16.1  09.6  04.5      Hematocrit 36.0 - 46.0 % 37.0  37.5  37      Platelets 150 - 400 K/uL 217  230  184  This result is from an external source.      Latest Ref Rng & Units 04/09/2023    2:29 PM 01/12/2023    1:25 PM 12/25/2022   12:00 AM  CMP  Glucose 70 - 99 mg/dL 629  528    BUN 8 - 23 mg/dL 18  18  18       Creatinine 0.44 - 1.00 mg/dL 4.13  2.44  1.0      Sodium 135 - 145 mmol/L 140  142  143      Potassium 3.5 - 5.1 mmol/L 3.2  4.1  4.1      Chloride 98 - 111 mmol/L 107  110  107      CO2 22 - 32 mmol/L 23  22  25       Calcium 8.9 - 10.3 mg/dL 8.4  6.7  9.1      Total Protein 6.5 - 8.1 g/dL 6.9  7.0    Total Bilirubin 0.3 - 1.2 mg/dL 0.3  0.5    Alkaline Phos 38 - 126 U/L 66  77  94      AST 15 - 41 U/L 16  14  23       ALT 0 - 44 U/L 14  9  9          This result is from an external source.   Component Ref Range & Units 04/09/23  Vitamin B-12 180 - 914 pg/mL 361     Component Ref Range & Units 04/09/23  Folate >5.9 ng/mL 9.9   Component Ref Range & Units 1 mo ago (04/09/23) 6 mo ago (10/22/22) 6 mo ago (10/21/22) 6 mo ago (10/20/22) 6 mo ago (10/19/22)  Magnesium 1.7 - 2.4 mg/dL 2.3 2.0 CM 1.8 CM 1.7 CM 1.8 CM    No results found for: "CEA1", "CEA" / No results found for: "CEA1", "CEA" No results found for: "PSA1" No results found for: "WNU272" No results found for: "CAN125"  No results found for: "TOTALPROTELP", "ALBUMINELP", "A1GS", "A2GS", "BETS", "BETA2SER", "GAMS", "MSPIKE", "SPEI" Lab Results  Component Value Date   TIBC 420 04/09/2023   TIBC 361 10/20/2022   FERRITIN 11 10/20/2022   IRONPCTSAT 8 (L) 04/09/2023   IRONPCTSAT 27 10/20/2022   No results found for: "LDH"  STUDIES:  No results found.  EXAM: 10/19/2022 CT ABDOMEN AND PELVIS WITH CONTRAST IMPRESSION: 1. No CT evidence for acute intra-abdominal or pelvic abnormality. 2. Hepatic steatosis. 3. Aortic atherosclerosis.(ICD10-I70.0).     HISTORY:   Past Medical  History:  Diagnosis Date   Anemia    Arthritis    CAD (coronary artery disease)    a. s/p 2 CoStar DES to RCA 06/2004. b.  LHC 12/09:  EF 65%, oCFX 40%, oRCA 30%, then 30-40% leading into stented seg that is patent, (10-20% ISR), 40% beyond stent,;  c. EF 81%, poor exercise capacity, hypotensive BP response, + chest pain, normal nuclear images (overall low risk)   CKD (chronic kidney disease)    Colon polyps    a. s/p removal 01/2012 - was told one might be cancerous but was not diagnosed with colon cancer - plan is for f/u colonoscopy in 5 yrs.   COPD (chronic obstructive pulmonary disease) (HCC)    Depression    GERD (gastroesophageal reflux disease)    Hx of echocardiogram    a. Echo 06/2011: EF 55-60%, Gr 1 diast dysfn, mild MR   Insomnia    Mini stroke    History of, in 2005 (  before she was diagnosed with CAD).   Osteoporosis    Other and unspecified hyperlipidemia    Other isolated or specific phobias    Peripheral vascular disease, unspecified (HCC)    a. s/p L common femoral & left illiac stenting previously (patent by angio in 2007). - ABI 06/2011 normal. b. Carotid US 0-39% BICA 06/2011 (for f/u 2 yrs);   c.  ABIs 9/13:  normal (1.1 bilaterally)   Personal history of peptic ulcer disease    Remotely.   Psychiatric pseudoseizure    per patient and family   TIA (transient ischemic attack)    Tobacco use disorder    Type II or unspecified type diabetes mellitus without mention of complication, not stated as uncontrolled    Unspecified essential hypertension     Past Surgical History:  Procedure Laterality Date   APPENDECTOMY     BREAST BIOPSY     BRONCHIAL BIOPSY  04/19/2023   Procedure: BRONCHIAL BIOPSIES;  Surgeon: Leslye Peer, MD;  Location: [facility] ENDOSCOPY;  Service: Pulmonary;;   BRONCHIAL BRUSHINGS  04/19/2023   Procedure: BRONCHIAL BRUSHINGS;  Surgeon: Leslye Peer, MD;  Location: [facility] ENDOSCOPY;  Service: Pulmonary;;   BRONCHIAL NEEDLE ASPIRATION BIOPSY  04/19/2023    Procedure: BRONCHIAL NEEDLE ASPIRATION BIOPSIES;  Surgeon: Leslye Peer, MD;  Location: [facility] ENDOSCOPY;  Service: Pulmonary;;   BRONCHIAL WASHINGS  04/19/2023   Procedure: BRONCHIAL WASHINGS;  Surgeon: Leslye Peer, MD;  Location: [facility] ENDOSCOPY;  Service: Pulmonary;;   CARDIAC CATHETERIZATION  2009   patent RCA stent. mild CAD otherwise.    CATARACT EXTRACTION, BILATERAL  2016   CHOLECYSTECTOMY  2009   FIDUCIAL MARKER PLACEMENT  04/19/2023   Procedure: FIDUCIAL MARKER PLACEMENT;  Surgeon: Leslye Peer, MD;  Location: [facility] ENDOSCOPY;  Service: Pulmonary;;   FLEXIBLE SIGMOIDOSCOPY N/A 10/21/2022   Procedure: Arnell Sieving;  Surgeon: Charna Elizabeth, MD;  Location: [facility] ENDOSCOPY;  Service: Gastroenterology;  Laterality: N/A;   hysterectomy (other)     age 69   KNEE ARTHROSCOPY Left    left arm surgery     LEFT HEART CATHETERIZATION WITH CORONARY ANGIOGRAM N/A 01/16/2014   Procedure: LEFT HEART CATHETERIZATION WITH CORONARY ANGIOGRAM;  Surgeon: Micheline Chapman, MD;  Location: [facility] CATH LAB;  Service: Cardiovascular;  Laterality: N/A;   left leg angiography     stenting of the left common iliac artery   left rotator cuff repair     right coronary artery stent      Family History  Problem Relation Age of Onset   Heart attack Mother    Allergies Mother    Heart attack Father    Allergies Father    Kidney disease Father    Hypertension Father    Melanoma Sister    Coronary artery disease Brother    Breast cancer Maternal Aunt    Breast cancer Maternal Aunt    Breast cancer Paternal Aunt        d. under 93   Pancreatic cancer Maternal Grandmother    Breast cancer Daughter        BRCA1+    Social History:  reports that she has been smoking cigarettes. She has a 66 pack-year smoking history. She has never used smokeless tobacco. She reports that she does not drink alcohol and does not use drugs. The patient continues to smoke 1-1/2 packs/day.  The patient is accompanied by her  daughter today.  Allergies:  Allergies  Allergen Reactions   Amoxicillin Hives and  Nausea And Vomiting   Claritin [Loratadine] Other (See Comments)    Pt's daughter notified us of this allergy.   Insulins Nausea And Vomiting and Other (See Comments)    Patient unsure of name of insulin, will call us back   Vioxx [Rofecoxib] Hives   Celecoxib Hives and Nausea And Vomiting   Liraglutide Nausea And Vomiting   Propranolol Diarrhea and Nausea And Vomiting    Current Medications: Current Outpatient Medications  Medication Sig Dispense Refill   acetaminophen (TYLENOL) 500 MG tablet Take 1,000 mg by mouth as needed for moderate pain.     amLODipine (NORVASC) 2.5 MG tablet Take 2.5 mg by mouth 2 (two) times daily.     aspirin EC 81 MG tablet Take 1 tablet (81 mg total) by mouth daily. 30 tablet 11   busPIRone (BUSPAR) 7.5 MG tablet Take 7.5 mg by mouth as needed (anxiety).     busPIRone (BUSPAR) 7.5 MG tablet Take 1 tablet by mouth daily.     calcium carbonate (OS-CAL) 600 MG tablet Take 600 mg by mouth daily.     clopidogrel (PLAVIX) 75 MG tablet Take 1 tablet (75 mg total) by mouth daily.     divalproex (DEPAKOTE) 250 MG DR tablet Take 250 mg by mouth 2 (two) times daily.     donepezil (ARICEPT) 5 MG tablet Take 5 mg by mouth daily.     DULoxetine (CYMBALTA) 60 MG capsule Take 60 mg by mouth at bedtime.     ezetimibe (ZETIA) 10 MG tablet Take 10 mg by mouth daily.     famotidine (PEPCID) 20 MG tablet Take 20 mg by mouth 2 (two) times daily.     fexofenadine (ALLEGRA) 180 MG tablet Take 180 mg by mouth as needed for allergies.     hydrALAZINE (APRESOLINE) 50 MG tablet Take 50 mg by mouth in the morning and at bedtime.     JARDIANCE 25 MG TABS tablet Take 25 mg by mouth daily.     LANTUS SOLOSTAR 100 UNIT/ML Solostar Pen Inject 20 Units into the skin at bedtime.     metoprolol tartrate (LOPRESSOR) 25 MG tablet Take 25 mg by mouth 2 (two) times daily.     nitroGLYCERIN (NITROSTAT) 0.4 MG SL  tablet Place 0.4 mg under the tongue every 5 (five) minutes as needed. For chest pain     pantoprazole (PROTONIX) 40 MG tablet Take 40 mg by mouth 2 (two) times daily.     potassium chloride (KLOR-CON M) 10 MEQ tablet Take 1 tablet (10 mEq total) by mouth 2 (two) times daily.     PROLIA 60 MG/ML SOSY injection Inject 60 mg into the skin every 6 (six) months.     valsartan-hydrochlorothiazide (DIOVAN-HCT) 320-25 MG tablet TAKE 1 TABLET BY ORAL ROUTE ONCE DAILY FOR HIGH BLOOD PRESSURE     zonisamide (ZONEGRAN) 50 MG capsule Take 200 mg by mouth in the morning and at bedtime.     No current facility-administered medications for this visit.     I,Jasmine M Lassiter,acting as a scribe for Dellia Beckwith, MD.,have documented all relevant documentation on the behalf of Dellia Beckwith, MD,as directed by  Dellia Beckwith, MD while in the presence of Dellia Beckwith, MD.

## 2023-05-22 DIAGNOSIS — R0603 Acute respiratory distress: Secondary | ICD-10-CM | POA: Diagnosis not present

## 2023-05-22 DIAGNOSIS — U071 COVID-19: Secondary | ICD-10-CM | POA: Diagnosis not present

## 2023-05-23 ENCOUNTER — Encounter: Payer: Self-pay | Admitting: Oncology

## 2023-05-23 DIAGNOSIS — C3411 Malignant neoplasm of upper lobe, right bronchus or lung: Secondary | ICD-10-CM | POA: Insufficient documentation

## 2023-05-26 ENCOUNTER — Other Ambulatory Visit: Payer: Self-pay | Admitting: Oncology

## 2023-05-26 DIAGNOSIS — R911 Solitary pulmonary nodule: Secondary | ICD-10-CM | POA: Diagnosis not present

## 2023-05-26 DIAGNOSIS — E876 Hypokalemia: Secondary | ICD-10-CM

## 2023-05-28 ENCOUNTER — Telehealth: Payer: Self-pay | Admitting: Oncology

## 2023-05-28 NOTE — Telephone Encounter (Signed)
05/28/23 Lef msg next appt scheduled on 06/03/23@330pm .

## 2023-05-29 DIAGNOSIS — U071 COVID-19: Secondary | ICD-10-CM | POA: Diagnosis not present

## 2023-05-31 ENCOUNTER — Other Ambulatory Visit: Payer: Self-pay | Admitting: Oncology

## 2023-05-31 DIAGNOSIS — Z171 Estrogen receptor negative status [ER-]: Secondary | ICD-10-CM

## 2023-05-31 DIAGNOSIS — D62 Acute posthemorrhagic anemia: Secondary | ICD-10-CM

## 2023-06-01 ENCOUNTER — Ambulatory Visit: Admitting: Oncology

## 2023-06-01 NOTE — Progress Notes (Signed)
[facility]  [address] [place],  [place]  [address] [phone]  Clinic Day: 06/03/23  Referring physician: [name], MD  ASSESSMENT & PLAN:  Assessment & Plan: Lung cancer, right upper lobe [facility]), non small cell type New diagnosis of non-small cell lung cancer of the right upper lobe. Clinical stage IA3 (T1c N0 M0).  CT chest, abdomen and pelvis done in the ER July 22 after a fall revealed a 25 x 22 mm right upper lobe groundglass nodule increased in size from September 2023.  There were central lobar micronodules in the right upper lobe, lingula and right middle lobe.  PET on August 16 revealed 2.5 cm subsolid nodule in the central right upper lobe, suspicious for primary bronchogenic carcinoma, specifically semi invasive adenocarcinoma. No findings suspicious for metastatic disease. Radiation changes involving the left breast and anterior left upper lobe. Multiple left anterolateral rib fracture deformities, including a mildly displaced left lateral 9th rib. Hypermetabolic 12 mm right isthmic thyroid nodule. Recommend thyroid US and biopsy. She was referred to Dr. Delton Coombes and underwent bronchoscopy on September 9.  Brushings revealed malignant cells consistent with non-small cell lung cancer.   Unfortunately, she continues to smoke about 1-1/2 packs a day. Due to her multiple medical comorbidities, she is not likely to be a surgical candidate. Her PET scan does not show any evidence of metastatic disease and so this appears to be a stage I non-small cell lung cancer.  I explained we would normally approach this with surgery but I doubt she is a surgical candidate and so we will plan referral to radiation oncology at [facility] in [place] so she can be considered for stereotactic radiation.  The MRI of the brain was negative for metastatic disease.   Breast cancer of upper-outer quadrant of left female breast (HCC) Stage IB triple negative  breast cancer. She had difficulty tolerating chemotherapy with docetaxel/cyclophosphamide despite dose reduction.  She completed chemotherapy in December 2023.  She intermittently required IV fluids after completion of chemotherapy.  She received adjuvant radiation to the left breast completed in very 2024.  Bilateral diagnostic mammogram in June did not reveal any evidence of malignancy.  She remains without evidence of recurrence.  Will plan to see her back in October as scheduled.   Psychosomatic seizures Reported by family. I have witnessed these on several occasions at her appointments but she usually recovers with prompting from her daughter to take deep breaths.  This time she seemed much less responsive and did not come around with coaching from her daughter.  This event still may have represented a pseudoseizure but she will be transported to the emergency room in order to have further evaluation.  The EMS personnel was informed of her case and the events of this morning.   Thyroid nodule greater than or equal to 1 cm in diameter incidentally noted on imaging study 12 mm right thyroid nodule hypermetabolic on PET in August. The radiologist did not recommend a biopsy but did recommend a 1 year follow-up ultrasound, especially since this did show some activity on the PET scan.   Pseudoseizure versus cerebrovascular accident The patient came for her appointment October 2024, but had an episode prior to phlebotomy where she appeared unresponsive with eyes rolled back and unable to speak.  She still had a pulse and blood pressure and was placed on a stretcher.  EMS was called and her blood sugar was normal.  By that time she  was starting to wake up but was still nonverbal.  They therefore called a code stroke and transported her to the emergency room immediately.  Plan: She had a cytology done that showed rare atypical cells suspicious for malignancy. Brushings revealed malignant cells consistent  with non-small cell lung cancer.  MRI of the brain was negative for malignancy. Unfortunately, she continues to smoke about 1-1/2 packs a day.  She remains weak since her breast cancer treatment.  Due to her multiple medical comorbidities, she is not likely to be a surgical candidate, especially with her dementia and continued tobacco abuse. I have therefore recommended stereotactic radiation and will refer her to radiation oncology at [facility] in [place]. I explained why this technique is not available in [place]. She and her daughter agree with not pursuing surgical consultation. She did have a ultrasound of the neck on 05/06/2023 with 1 nodule measuring 1.2 cm and the other 0.7 cm. She is here to discuss treatment of her lung cancer since she had a episode on 05/14/2023 and we were not able to review the information. I think she had a TIA and was evaluated in the ER for code stroke and recovered promptly. She was given a CT head on 05/14/2023 that revealed no acute intracranial abnormality or significant interval change, stable mild periventricular white matter disease which likely reflects the sequela of chronic microvascular ischemia. She informed me that she has some purple looking boils under her right arm that are sore. During physical exam I found 1 large inflamed tender boil in her superior right axilla and 4 smaller ones inferior to that. I recommended hot compresses. Her labs today are pending. I will see her back in 2-3 months with CBC and CMP. The patient understands the plans discussed today and is in agreement with them.  She knows to contact our office if she develops concerns prior to her next appointment.  I provided 23 minutes of face-to-face time during this encounter and > 50% was spent counseling as documented under my assessment and plan.    Dellia Beckwith, MD  [facility] AT [facility] [address]  [place] [address] Dept: [phone] Dept Fax: [phone]   No orders of the defined types were placed in this encounter.     CHIEF COMPLAINT:  CC: Tender nodule on the right arm  Current Treatment:  Surveillance of breast cancer  HISTORY OF PRESENT ILLNESS:   Oncology History  Breast cancer of upper-outer quadrant of left female breast (HCC)  03/18/2022 Initial Diagnosis   Breast cancer of upper-outer quadrant of left female breast (HCC)   04/28/2022 Cancer Staging   Staging form: Breast, AJCC 8th Edition - Clinical stage from 04/28/2022: Stage IB (cT1c, cN0(sn), cM0, G3, ER-, PR-, HER2-) - Signed by Dellia Beckwith, MD on 04/28/2022 Histopathologic type: Infiltrating duct carcinoma, NOS Stage prefix: Initial diagnosis Method of lymph node assessment: Sentinel lymph node biopsy Nuclear grade: G3 Multigene prognostic tests performed: None Histologic grading system: 3 grade system Laterality: Left Tumor size (mm): 20 Lymph-vascular invasion (LVI): LVI not present (absent)/not identified Diagnostic confirmation: Positive histology Specimen type: Excision Staged by: Managing physician Menopausal status: Postmenopausal Ki-67 (%): 40 Stage used in treatment planning: Yes National guidelines used in treatment planning: Yes Type of national guideline used in treatment planning: NCCN   05/07/2022 - 07/14/2022 Chemotherapy   Patient is on Treatment Plan : BREAST TC q21d     05/19/2022 Genetic  Testing   Negative hereditary cancer genetic testing: no pathogenic variant detected in [facility] Common Hereditary Cancers +RNA Panel.  Report date is May 19, 2022.    The Common Hereditary Cancers + RNA Panel offered by [facility] includes sequencing, deletion/duplication, and RNA testing of the following 47 genes: APC, ATM, AXIN2, BARD1, BMPR1A, BRCA1, BRCA2, BRIP1, CDH1, CDK4*, CDKN2A (p14ARF)*, CDKN2A (p16INK4a)*, CHEK2, CTNNA1, DICER1, EPCAM (Deletion/duplication testing only),  GREM1 (promoter region deletion/duplication testing only), KIT, MEN1, MLH1, MSH2, MSH3, MSH6, MUTYH, NBN, NF1, NHTL1, PALB2, PDGFRA*, PMS2, POLD1, POLE, PTEN, RAD50, RAD51C, RAD51D, SDHB, SDHC, SDHD, SMAD4, SMARCA4. STK11, TP53, TSC1, TSC2, and VHL.  The following genes were evaluated for sequence changes only: SDHA and HOXB13 c.251G>A variant only.  RNA analysis is not performed for the * genes.     Primary cancer of right upper lobe of lung (HCC)  04/19/2023 Cancer Staging   Staging form: Lung, AJCC 8th Edition - Clinical stage from 04/19/2023: Stage IA3 (cT1c, cN0, cM0) - Signed by Dellia Beckwith, MD on 05/23/2023 Histopathologic type: Carcinoma, NOS Stage prefix: Initial diagnosis Laterality: Right Tumor size (mm): 25 Lymph-vascular invasion (LVI): Presence of LVI unknown/indeterminate Diagnostic confirmation: Positive cytology, no positive histology Specimen type: Bronchial Biopsy Staged by: Managing physician Type of lung cancer: Resectable non-small cell lung cancer in inoperable patients treated with radiotherapy ECOG performance status: Grade 1 Symptoms: Absent Prognostic indicators: Non small cell lung cancer Stage used in treatment planning: Yes National guidelines used in treatment planning: Yes Type of national guideline used in treatment planning: NCCN   05/23/2023 Initial Diagnosis   Primary cancer of right upper lobe of lung (HCC)       INTERVAL HISTORY:  Lindsay Brooks is seen here today for discussion of newly diagnosed early-stage non-small cell lung cancer of the right upper lobe.  She has been evaluated by Dr. Delton Coombes and has had bronchoscopy.  This is clinical stage IA3 (T1c N0 M0).  CT chest, abdomen and pelvis done in the ER July 22 after a fall revealed a 25 x 22 mm right upper lobe groundglass nodule increased in size from September 2023.  There were central lobar micronodules in the right upper lobe, lingula and right middle lobe.  PET on August 16 revealed a 2.5 cm  subsolid nodule in the central right upper lobe, suspicious for primary bronchogenic carcinoma, specifically semi invasive adenocarcinoma. No findings suspicious for metastatic disease. She had radiation changes involving the left breast and anterior left upper lobe, multiple left anterolateral rib fracture deformities, including a mildly displaced left lateral 9th rib, and a hypermetabolic 12 mm right isthmic thyroid nodule. She was referred to Dr. Delton Coombes and underwent bronchoscopy on September 9.  She had a cytology done that showed rare atypical cells suspicious for malignancy. Brushings revealed malignant cells consistent with non-small cell lung cancer.  MRI of the brain was negative for malignancy. Unfortunately, she continues to smoke about 1-1/2 packs a day.  She remains weak since her breast cancer treatment.  Due to her multiple medical comorbidities, she is not likely to be a surgical candidate, especially with her dementia and continued tobacco abuse. I have therefore recommended stereotactic radiation and will refer her to radiation oncology at [facility] in [place]. I explained why this technique is not available in [place]. She and her daughter agree with not pursuing surgical consultation. She did have a ultrasound of the neck on 05/06/2023 with 1 nodule measuring 1.2 cm and the other 0.7 cm. The radiologist did  not recommend a biopsy but did recommend a 1 year follow-up ultrasound, especially since this did show some activity on the PET scan.   She was here to discuss treatment of her lung cancer since she had a episode on 05/14/2023 and we were not able to review the information. I think she had a TIA and was evaluated in the ER for code stroke and recovered promptly. She was given a CT head on 05/14/2023 that revealed no acute intracranial abnormality or significant interval change, stable mild periventricular white matter disease which likely reflects the sequela of chronic  microvascular ischemia. Patient states that she feels well but complains of headaches and neck pain located at the back. She informed me that she has some purple looking boils under her right arm that are sore. During physical exam I found 1 large inflamed tender boil in her superior right axilla and 4 smaller ones inferior to that. I recommended hot compresses.  Her labs today are pending. I will see her back in 2-3 months with CBC and CMP.   She denies signs of infection such as sore throat, sinus drainage, cough, or urinary symptoms.  She denies fevers or recurrent chills. She denies pain. She denies nausea, vomiting, chest pain, dyspnea or cough. Her appetite is good and her weight has decreased 4 pounds over last 1.5 months . She is accompanied with her daughter at today's visit.   REVIEW OF SYSTEMS:  Review of Systems  Constitutional:  Positive for fatigue and unexpected weight change. Negative for appetite change, chills, diaphoresis and fever.  HENT:  Negative.  Negative for hearing loss, lump/mass, mouth sores, nosebleeds, sore throat, tinnitus, trouble swallowing and voice change.   Eyes: Negative.  Negative for eye problems and icterus.  Respiratory: Negative.  Negative for chest tightness, cough, hemoptysis, shortness of breath and wheezing.   Cardiovascular: Negative.  Negative for chest pain, leg swelling and palpitations.  Gastrointestinal: Negative.  Negative for abdominal distention, abdominal pain, blood in stool, constipation, diarrhea, nausea, rectal pain and vomiting.  Endocrine: Negative.  Negative for hot flashes.  Genitourinary: Negative.  Negative for bladder incontinence, difficulty urinating, dyspareunia, dysuria, frequency, hematuria, menstrual problem, nocturia, pelvic pain, vaginal bleeding and vaginal discharge.   Musculoskeletal: Negative.  Negative for arthralgias, back pain, flank pain, gait problem, myalgias, neck pain and neck stiffness.  Skin:  Negative for  itching, rash and wound.       1 large inflamed tender boil in her superior right axilla and 4 smaller ones inferior to that  Neurological:  Positive for headaches. Negative for dizziness, extremity weakness, gait problem, light-headedness, numbness, seizures and speech difficulty.  Hematological: Negative.  Negative for adenopathy. Does not bruise/bleed easily.  Psychiatric/Behavioral:  Positive for depression. Negative for confusion, decreased concentration, sleep disturbance and suicidal ideas. The patient is nervous/anxious.      VITALS:  Blood pressure (!) 100/52, pulse 82, temperature (!) 97.5 F (36.4 C), temperature source Oral, resp. rate 18, height 5\' 1"  (1.549 m), weight 121 lb 9.6 oz (55.2 kg), SpO2 100%.  Wt Readings from Last 3 Encounters:  06/15/23 118 lb 8 oz (53.8 kg)  06/03/23 121 lb 9.6 oz (55.2 kg)  04/27/23 121 lb 3.2 oz (55 kg)    Body mass index is 22.98 kg/m.  Performance status (ECOG): 2 - Symptomatic, <50% confined to bed  PHYSICAL EXAM:  Physical Exam Vitals and nursing note reviewed. Exam conducted with a chaperone present.  Constitutional:      General: She  is not in acute distress.    Appearance: Normal appearance. She is normal weight. She is ill-appearing (Chronically ill-appearing). She is not toxic-appearing or diaphoretic.     Comments: Ambulates on her own.  Difficulty getting on and off the examination table.  HENT:     Head: Normocephalic and atraumatic.     Right Ear: Tympanic membrane, ear canal and external ear normal. There is no impacted cerumen.     Left Ear: Tympanic membrane, ear canal and external ear normal. There is no impacted cerumen.     Nose: Nose normal. No congestion or rhinorrhea.     Mouth/Throat:     Mouth: Mucous membranes are moist.     Pharynx: Oropharynx is clear. No oropharyngeal exudate or posterior oropharyngeal erythema.  Eyes:     General: No scleral icterus.       Right eye: No discharge.        Left eye: No  discharge.     Extraocular Movements: Extraocular movements intact.     Conjunctiva/sclera: Conjunctivae normal.     Pupils: Pupils are equal, round, and reactive to light.  Neck:     Vascular: No carotid bruit.  Cardiovascular:     Rate and Rhythm: Normal rate and regular rhythm.     Pulses: Normal pulses.     Heart sounds: Normal heart sounds. No murmur heard.    No friction rub. No gallop.  Pulmonary:     Effort: Pulmonary effort is normal. No respiratory distress.     Breath sounds: Normal breath sounds. No stridor. No wheezing, rhonchi or rales.  Chest:     Chest wall: No tenderness.  Breasts:    Right: Inverted nipple (Chronic, stable) present. No mass, nipple discharge or skin change.     Left: Normal. No inverted nipple, mass, nipple discharge or skin change.     Comments: Mild fibrocystic changes in the right breast Left breast has post radiation changes and a healing scar in the superior breast at about 11 o'clock.   Abdominal:     General: Bowel sounds are normal. There is no distension.     Palpations: Abdomen is soft. There is no hepatomegaly, splenomegaly or mass.     Tenderness: There is no abdominal tenderness. There is no right CVA tenderness, left CVA tenderness, guarding or rebound.     Hernia: No hernia is present.  Musculoskeletal:        General: No swelling, tenderness, deformity or signs of injury. Normal range of motion.     Cervical back: Normal range of motion and neck supple. No rigidity or tenderness.     Right lower leg: No edema.     Left lower leg: No edema.  Lymphadenopathy:     Cervical: No cervical adenopathy.     Right cervical: No superficial, deep or posterior cervical adenopathy.    Left cervical: No superficial, deep or posterior cervical adenopathy.     Upper Body:     Right upper body: No supraclavicular, axillary or pectoral adenopathy.     Left upper body: No supraclavicular, axillary or pectoral adenopathy.     Lower Body: No right  inguinal adenopathy. No left inguinal adenopathy.     Comments: 1 large inflamed tender boil in her superior right axilla and 4 smaller ones inferior to that   Skin:    General: Skin is warm and dry.     Coloration: Skin is not jaundiced or pale.     Findings: No bruising,  erythema, lesion or rash.  Neurological:     General: No focal deficit present.     Mental Status: She is alert and oriented to person, place, and time. Mental status is at baseline.     Cranial Nerves: No cranial nerve deficit.     Sensory: No sensory deficit.     Motor: No weakness.     Coordination: Coordination normal.     Gait: Gait normal.     Deep Tendon Reflexes: Reflexes normal.  Psychiatric:        Mood and Affect: Mood normal.        Behavior: Behavior normal.        Thought Content: Thought content normal.        Judgment: Judgment normal.    LABS:      Latest Ref Rng & Units 06/03/2023    3:11 PM 04/09/2023    2:29 PM 01/12/2023    1:25 PM  CBC  WBC 4.0 - 10.5 K/uL 10.7  7.0  8.7   Hemoglobin 12.0 - 15.0 g/dL 8.6  40.9  81.1   Hematocrit 36.0 - 46.0 % 29.6  37.0  37.5   Platelets 150 - 400 K/uL 329  217  230       Latest Ref Rng & Units 06/03/2023    3:11 PM 04/09/2023    2:29 PM 01/12/2023    1:25 PM  CMP  Glucose 70 - 99 mg/dL 914  782  956   BUN 8 - 23 mg/dL 27  18  18    Creatinine 0.44 - 1.00 mg/dL 2.13  0.86  5.78   Sodium 135 - 145 mmol/L 136  140  142   Potassium 3.5 - 5.1 mmol/L 3.5  3.2  4.1   Chloride 98 - 111 mmol/L 104  107  110   CO2 22 - 32 mmol/L 22  23  22    Calcium 8.9 - 10.3 mg/dL 8.8  8.4  6.7   Total Protein 6.5 - 8.1 g/dL 6.9  6.9  7.0   Total Bilirubin 0.3 - 1.2 mg/dL 0.3  0.3  0.5   Alkaline Phos 38 - 126 U/L 58  66  77   AST 15 - 41 U/L 15  16  14    ALT 0 - 44 U/L 9  14  9     Component Ref Range & Units 04/09/23  Vitamin B-12 180 - 914 pg/mL 361     Component Ref Range & Units 04/09/23  Folate >5.9 ng/mL 9.9   Component Ref Range & Units 1 mo  ago (04/09/23) 6 mo ago (10/22/22) 6 mo ago (10/21/22) 6 mo ago (10/20/22) 6 mo ago (10/19/22)  Magnesium 1.7 - 2.4 mg/dL 2.3 2.0 CM 1.8 CM 1.7 CM 1.8 CM    No results found for: "CEA1", "CEA" / No results found for: "CEA1", "CEA" No results found for: "PSA1" No results found for: "ION629" No results found for: "CAN125"  No results found for: "TOTALPROTELP", "ALBUMINELP", "A1GS", "A2GS", "BETS", "BETA2SER", "GAMS", "MSPIKE", "SPEI" Lab Results  Component Value Date   TIBC 553 (H) 06/03/2023   TIBC 420 04/09/2023   TIBC 361 10/20/2022   FERRITIN 3 (L) 06/03/2023   FERRITIN 11 10/20/2022   IRONPCTSAT 4 (L) 06/03/2023   IRONPCTSAT 8 (L) 04/09/2023   IRONPCTSAT 27 10/20/2022   No results found for: "LDH"  STUDIES:  EXAM: 10/19/2022 CT ABDOMEN AND PELVIS WITH CONTRAST IMPRESSION: 1. No CT evidence for acute intra-abdominal or pelvic abnormality. 2.  Hepatic steatosis. 3. Aortic atherosclerosis.(ICD10-I70.0).     HISTORY:   Past Medical History:  Diagnosis Date   Anemia    Arthritis    CAD (coronary artery disease)    a. s/p 2 CoStar DES to RCA 06/2004. b.  LHC 12/09:  EF 65%, oCFX 40%, oRCA 30%, then 30-40% leading into stented seg that is patent, (10-20% ISR), 40% beyond stent,;  c. EF 81%, poor exercise capacity, hypotensive BP response, + chest pain, normal nuclear images (overall low risk)   CKD (chronic kidney disease)    Colon polyps    a. s/p removal 01/2012 - was told one might be cancerous but was not diagnosed with colon cancer - plan is for f/u colonoscopy in 5 yrs.   COPD (chronic obstructive pulmonary disease) (HCC)    Depression    GERD (gastroesophageal reflux disease)    Hx of echocardiogram    a. Echo 06/2011: EF 55-60%, Gr 1 diast dysfn, mild MR   Insomnia    Mini stroke    History of, in 2005 (before she was diagnosed with CAD).   Osteoporosis    Other and unspecified hyperlipidemia    Other isolated or specific phobias    Peripheral vascular disease,  unspecified (HCC)    a. s/p L common femoral & left illiac stenting previously (patent by angio in 2007). - ABI 06/2011 normal. b. Carotid US 0-39% BICA 06/2011 (for f/u 2 yrs);   c.  ABIs 9/13:  normal (1.1 bilaterally)   Personal history of peptic ulcer disease    Remotely.   Psychiatric pseudoseizure    per patient and family   TIA (transient ischemic attack)    Tobacco use disorder    Type II or unspecified type diabetes mellitus without mention of complication, not stated as uncontrolled    Unspecified essential hypertension     Past Surgical History:  Procedure Laterality Date   APPENDECTOMY     BREAST BIOPSY     BRONCHIAL BIOPSY  04/19/2023   Procedure: BRONCHIAL BIOPSIES;  Surgeon: Leslye Peer, MD;  Location: [facility] ENDOSCOPY;  Service: Pulmonary;;   BRONCHIAL BRUSHINGS  04/19/2023   Procedure: BRONCHIAL BRUSHINGS;  Surgeon: Leslye Peer, MD;  Location: [facility] ENDOSCOPY;  Service: Pulmonary;;   BRONCHIAL NEEDLE ASPIRATION BIOPSY  04/19/2023   Procedure: BRONCHIAL NEEDLE ASPIRATION BIOPSIES;  Surgeon: Leslye Peer, MD;  Location: [facility] ENDOSCOPY;  Service: Pulmonary;;   BRONCHIAL WASHINGS  04/19/2023   Procedure: BRONCHIAL WASHINGS;  Surgeon: Leslye Peer, MD;  Location: [facility] ENDOSCOPY;  Service: Pulmonary;;   CARDIAC CATHETERIZATION  2009   patent RCA stent. mild CAD otherwise.    CATARACT EXTRACTION, BILATERAL  2016   CHOLECYSTECTOMY  2009   FIDUCIAL MARKER PLACEMENT  04/19/2023   Procedure: FIDUCIAL MARKER PLACEMENT;  Surgeon: Leslye Peer, MD;  [facility] ENDOSCOPY;  Service: Pulmonary;;   FLEXIBLE SIGMOIDOSCOPY N/A 10/21/2022   Procedure: Arnell Sieving;  Surgeon: Charna Elizabeth, MD;  Location: [facility] ENDOSCOPY;  Service: Gastroenterology;  Laterality: N/A;   hysterectomy (other)     age 72   KNEE ARTHROSCOPY Left    left arm surgery     LEFT HEART CATHETERIZATION WITH CORONARY ANGIOGRAM N/A 01/16/2014   Procedure: LEFT HEART CATHETERIZATION WITH CORONARY ANGIOGRAM;   Surgeon: Micheline Chapman, MD;  Location: [facility] CATH LAB;  Service: Cardiovascular;  Laterality: N/A;   left leg angiography     stenting of the left common iliac artery   left rotator cuff repair  right coronary artery stent      Family History  Problem Relation Age of Onset   Heart attack Mother    Allergies Mother    Heart attack Father    Allergies Father    Kidney disease Father    Hypertension Father    Melanoma Sister    Coronary artery disease Brother    Breast cancer Maternal Aunt    Breast cancer Maternal Aunt    Breast cancer Paternal Aunt        d. under 53   Pancreatic cancer Maternal Grandmother    Breast cancer Daughter        BRCA1+    Social History:  reports that she has been smoking cigarettes. She has a 66 pack-year smoking history. She has never used smokeless tobacco. She reports that she does not drink alcohol and does not use drugs. The patient continues to smoke 1-1/2 packs/day.  The patient is accompanied by her daughter today.  Allergies:  Allergies  Allergen Reactions   Amoxicillin Hives and Nausea And Vomiting   Claritin [Loratadine] Other (See Comments)    Pt's daughter notified us of this allergy.   Insulins Nausea And Vomiting and Other (See Comments)    Patient unsure of name of insulin, will call us back   Other Other (See Comments)   Vioxx [Rofecoxib] Hives   Celecoxib Hives and Nausea And Vomiting   Liraglutide Nausea And Vomiting   Propranolol Diarrhea and Nausea And Vomiting    Current Medications: Current Outpatient Medications  Medication Sig Dispense Refill   acetaminophen (TYLENOL) 500 MG tablet Take 1,000 mg by mouth as needed for moderate pain.     amLODipine (NORVASC) 2.5 MG tablet Take 2.5 mg by mouth 2 (two) times daily.     aspirin EC 81 MG tablet Take 1 tablet (81 mg total) by mouth daily. 30 tablet 11   busPIRone (BUSPAR) 7.5 MG tablet Take 7.5 mg by mouth as needed (anxiety).     busPIRone (BUSPAR) 7.5 MG tablet Take  1 tablet by mouth daily.     calcium carbonate (OS-CAL) 600 MG tablet Take 600 mg by mouth daily.     clopidogrel (PLAVIX) 75 MG tablet Take 1 tablet (75 mg total) by mouth daily.     divalproex (DEPAKOTE) 250 MG DR tablet Take 250 mg by mouth 2 (two) times daily.     donepezil (ARICEPT) 5 MG tablet Take 5 mg by mouth daily.     DULoxetine (CYMBALTA) 60 MG capsule Take 60 mg by mouth at bedtime.     ezetimibe (ZETIA) 10 MG tablet Take 10 mg by mouth daily.     famotidine (PEPCID) 20 MG tablet Take 20 mg by mouth 2 (two) times daily.     fexofenadine (ALLEGRA) 180 MG tablet Take 180 mg by mouth as needed for allergies.     hydrALAZINE (APRESOLINE) 50 MG tablet Take 50 mg by mouth in the morning and at bedtime.     JARDIANCE 25 MG TABS tablet Take 25 mg by mouth daily.     KLOR-CON M10 10 MEQ tablet TAKE 1 TABLET BY MOUTH EVERY DAY 90 tablet 1   LANTUS SOLOSTAR 100 UNIT/ML Solostar Pen Inject 20 Units into the skin at bedtime.     metoprolol tartrate (LOPRESSOR) 25 MG tablet Take 25 mg by mouth 2 (two) times daily.     nitroGLYCERIN (NITROSTAT) 0.4 MG SL tablet Place 0.4 mg under the tongue every 5 (five) minutes as  needed. For chest pain     pantoprazole (PROTONIX) 40 MG tablet Take 40 mg by mouth 2 (two) times daily.     PROLIA 60 MG/ML SOSY injection Inject 60 mg into the skin every 6 (six) months.     valsartan-hydrochlorothiazide (DIOVAN-HCT) 320-25 MG tablet TAKE 1 TABLET BY ORAL ROUTE ONCE DAILY FOR HIGH BLOOD PRESSURE     zonisamide (ZONEGRAN) 50 MG capsule Take 200 mg by mouth in the morning and at bedtime.     No current facility-administered medications for this visit.     I,Jasmine M Lassiter,acting as a scribe for Dellia Beckwith, MD.,have documented all relevant documentation on the behalf of Dellia Beckwith, MD,as directed by  Dellia Beckwith, MD while in the presence of Dellia Beckwith, MD.

## 2023-06-03 ENCOUNTER — Other Ambulatory Visit: Payer: Self-pay | Admitting: Oncology

## 2023-06-03 ENCOUNTER — Other Ambulatory Visit: Payer: Self-pay | Admitting: Radiation Oncology

## 2023-06-03 ENCOUNTER — Telehealth: Payer: Self-pay | Admitting: Radiation Oncology

## 2023-06-03 ENCOUNTER — Encounter: Payer: Self-pay | Admitting: Oncology

## 2023-06-03 ENCOUNTER — Inpatient Hospital Stay (HOSPITAL_BASED_OUTPATIENT_CLINIC_OR_DEPARTMENT_OTHER): Payer: Medicare Other | Admitting: Oncology

## 2023-06-03 ENCOUNTER — Ambulatory Visit
Admission: RE | Admit: 2023-06-03 | Discharge: 2023-06-03 | Disposition: A | Discharge status: Home / Self Care | Payer: Self-pay | Source: Ambulatory Visit | Attending: Radiation Oncology | Admitting: Radiation Oncology

## 2023-06-03 ENCOUNTER — Inpatient Hospital Stay: Payer: Medicare Other

## 2023-06-03 ENCOUNTER — Other Ambulatory Visit: Payer: Self-pay

## 2023-06-03 VITALS — BP 100/52 | HR 82 | Temp 97.5°F | Resp 18 | Ht 61.0 in | Wt 121.6 lb

## 2023-06-03 DIAGNOSIS — C3490 Malignant neoplasm of unspecified part of unspecified bronchus or lung: Secondary | ICD-10-CM

## 2023-06-03 DIAGNOSIS — C3411 Malignant neoplasm of upper lobe, right bronchus or lung: Secondary | ICD-10-CM

## 2023-06-03 DIAGNOSIS — Z171 Estrogen receptor negative status [ER-]: Secondary | ICD-10-CM

## 2023-06-03 DIAGNOSIS — D500 Iron deficiency anemia secondary to blood loss (chronic): Secondary | ICD-10-CM

## 2023-06-03 DIAGNOSIS — Z853 Personal history of malignant neoplasm of breast: Secondary | ICD-10-CM | POA: Diagnosis not present

## 2023-06-03 DIAGNOSIS — C50412 Malignant neoplasm of upper-outer quadrant of left female breast: Secondary | ICD-10-CM | POA: Diagnosis not present

## 2023-06-03 DIAGNOSIS — D62 Acute posthemorrhagic anemia: Secondary | ICD-10-CM

## 2023-06-03 LAB — CBC WITH DIFFERENTIAL (CANCER CENTER ONLY)
Abs Immature Granulocytes: 0.04 10*3/uL (ref 0.00–0.07)
Basophils Absolute: 0.1 10*3/uL (ref 0.0–0.1)
Basophils Relative: 1 %
Eosinophils Absolute: 0.2 10*3/uL (ref 0.0–0.5)
Eosinophils Relative: 2 %
HCT: 29.6 % — ABNORMAL LOW (ref 36.0–46.0)
Hemoglobin: 8.6 g/dL — ABNORMAL LOW (ref 12.0–15.0)
Immature Granulocytes: 0 %
Lymphocytes Relative: 21 %
Lymphs Abs: 2.3 10*3/uL (ref 0.7–4.0)
MCH: 26.7 pg (ref 26.0–34.0)
MCHC: 29.1 g/dL — ABNORMAL LOW (ref 30.0–36.0)
MCV: 91.9 fL (ref 80.0–100.0)
Monocytes Absolute: 0.7 10*3/uL (ref 0.1–1.0)
Monocytes Relative: 6 %
Neutro Abs: 7.5 10*3/uL (ref 1.7–7.7)
Neutrophils Relative %: 70 %
Platelet Count: 329 10*3/uL (ref 150–400)
RBC: 3.22 MIL/uL — ABNORMAL LOW (ref 3.87–5.11)
RDW: 16 % — ABNORMAL HIGH (ref 11.5–15.5)
WBC Count: 10.7 10*3/uL — ABNORMAL HIGH (ref 4.0–10.5)
nRBC: 0 % (ref 0.0–0.2)

## 2023-06-03 LAB — IRON AND TIBC
Iron: 24 ug/dL — ABNORMAL LOW (ref 28–170)
Saturation Ratios: 4 % — ABNORMAL LOW (ref 10.4–31.8)
TIBC: 553 ug/dL — ABNORMAL HIGH (ref 250–450)
UIBC: 529 ug/dL

## 2023-06-03 LAB — CMP (CANCER CENTER ONLY)
ALT: 9 U/L (ref 0–44)
AST: 15 U/L (ref 15–41)
Albumin: 3.5 g/dL (ref 3.5–5.0)
Alkaline Phosphatase: 58 U/L (ref 38–126)
Anion gap: 10 (ref 5–15)
BUN: 27 mg/dL — ABNORMAL HIGH (ref 8–23)
CO2: 22 mmol/L (ref 22–32)
Calcium: 8.8 mg/dL — ABNORMAL LOW (ref 8.9–10.3)
Chloride: 104 mmol/L (ref 98–111)
Creatinine: 1.17 mg/dL — ABNORMAL HIGH (ref 0.44–1.00)
GFR, Estimated: 50 mL/min — ABNORMAL LOW (ref >60)
Glucose, Bld: 100 mg/dL — ABNORMAL HIGH (ref 70–99)
Potassium: 3.5 mmol/L (ref 3.5–5.1)
Sodium: 136 mmol/L (ref 135–145)
Total Bilirubin: 0.3 mg/dL (ref 0.3–1.2)
Total Protein: 6.9 g/dL (ref 6.5–8.1)

## 2023-06-03 LAB — VITAMIN B12: Vitamin B-12: 327 pg/mL (ref 180–914)

## 2023-06-03 LAB — FOLATE: Folate: 8.7 ng/mL (ref >5.9)

## 2023-06-03 LAB — FERRITIN: Ferritin: 3 ng/mL — ABNORMAL LOW (ref 11–307)

## 2023-06-03 NOTE — Telephone Encounter (Signed)
10/24 @ 3:59 pm Left voicemail for patient to call our office to be schedule for consult.

## 2023-06-03 NOTE — Telephone Encounter (Signed)
10/24 @ 2:30 pm Sent via stat fax request for PET images to be push to powershare from [facility].  Waiting on images.

## 2023-06-03 NOTE — Telephone Encounter (Signed)
10/24 @ 3:40 pm Called and spoke to Toniann Fail [facility]) upload images to PACs in epic.  Waiting on images.

## 2023-06-04 ENCOUNTER — Telehealth: Payer: Self-pay | Admitting: Radiation Oncology

## 2023-06-04 NOTE — Telephone Encounter (Signed)
10/25 @ 8:41 am Left voicemail for patient to call our office to be schedule for consult.

## 2023-06-04 NOTE — Telephone Encounter (Signed)
10/25 @ 8:36 am follow up call to [facility].  Left voicemail patient images still not attached to PACs in epic.

## 2023-06-07 ENCOUNTER — Telehealth: Payer: Self-pay | Admitting: Radiation Oncology

## 2023-06-07 NOTE — Telephone Encounter (Signed)
10/28 @ 1:30 pm still no response from patient and/or patient's daughter.  Left voicemail with Dr. Lavonda Jumbo referral coordinator no response back from patient.  Sending out unable to contact letter to patient's residence.

## 2023-06-07 NOTE — Telephone Encounter (Signed)
10/28 @ 8:59 am Left voicemail for patient to call our office to be schedule for consult.

## 2023-06-11 DIAGNOSIS — E1169 Type 2 diabetes mellitus with other specified complication: Secondary | ICD-10-CM | POA: Diagnosis not present

## 2023-06-11 DIAGNOSIS — E785 Hyperlipidemia, unspecified: Secondary | ICD-10-CM | POA: Diagnosis not present

## 2023-06-15 ENCOUNTER — Telehealth: Payer: Self-pay | Admitting: Oncology

## 2023-06-15 ENCOUNTER — Ambulatory Visit
Admission: RE | Admit: 2023-06-15 | Discharge: 2023-06-15 | Disposition: A | Discharge status: Home / Self Care | Payer: Medicare Other | Source: Ambulatory Visit | Attending: Radiation Oncology | Admitting: Radiation Oncology

## 2023-06-15 ENCOUNTER — Encounter: Payer: Self-pay | Admitting: Radiation Oncology

## 2023-06-15 VITALS — BP 114/50 | HR 67 | Temp 96.9°F | Resp 20 | Ht 61.0 in | Wt 118.5 lb

## 2023-06-15 DIAGNOSIS — Z794 Long term (current) use of insulin: Secondary | ICD-10-CM | POA: Diagnosis not present

## 2023-06-15 DIAGNOSIS — M810 Age-related osteoporosis without current pathological fracture: Secondary | ICD-10-CM | POA: Insufficient documentation

## 2023-06-15 DIAGNOSIS — Z8673 Personal history of transient ischemic attack (TIA), and cerebral infarction without residual deficits: Secondary | ICD-10-CM | POA: Diagnosis not present

## 2023-06-15 DIAGNOSIS — Z79899 Other long term (current) drug therapy: Secondary | ICD-10-CM | POA: Diagnosis not present

## 2023-06-15 DIAGNOSIS — E119 Type 2 diabetes mellitus without complications: Secondary | ICD-10-CM | POA: Insufficient documentation

## 2023-06-15 DIAGNOSIS — G4700 Insomnia, unspecified: Secondary | ICD-10-CM | POA: Diagnosis not present

## 2023-06-15 DIAGNOSIS — D649 Anemia, unspecified: Secondary | ICD-10-CM | POA: Diagnosis not present

## 2023-06-15 DIAGNOSIS — J449 Chronic obstructive pulmonary disease, unspecified: Secondary | ICD-10-CM | POA: Insufficient documentation

## 2023-06-15 DIAGNOSIS — Z7902 Long term (current) use of antithrombotics/antiplatelets: Secondary | ICD-10-CM | POA: Diagnosis not present

## 2023-06-15 DIAGNOSIS — E785 Hyperlipidemia, unspecified: Secondary | ICD-10-CM | POA: Diagnosis not present

## 2023-06-15 DIAGNOSIS — I129 Hypertensive chronic kidney disease with stage 1 through stage 4 chronic kidney disease, or unspecified chronic kidney disease: Secondary | ICD-10-CM | POA: Diagnosis not present

## 2023-06-15 DIAGNOSIS — F17210 Nicotine dependence, cigarettes, uncomplicated: Secondary | ICD-10-CM | POA: Diagnosis not present

## 2023-06-15 DIAGNOSIS — I2510 Atherosclerotic heart disease of native coronary artery without angina pectoris: Secondary | ICD-10-CM | POA: Insufficient documentation

## 2023-06-15 DIAGNOSIS — C3411 Malignant neoplasm of upper lobe, right bronchus or lung: Secondary | ICD-10-CM | POA: Diagnosis not present

## 2023-06-15 DIAGNOSIS — Z860101 Personal history of adenomatous and serrated colon polyps: Secondary | ICD-10-CM | POA: Insufficient documentation

## 2023-06-15 DIAGNOSIS — Z7982 Long term (current) use of aspirin: Secondary | ICD-10-CM | POA: Insufficient documentation

## 2023-06-15 DIAGNOSIS — C50412 Malignant neoplasm of upper-outer quadrant of left female breast: Secondary | ICD-10-CM | POA: Diagnosis not present

## 2023-06-15 DIAGNOSIS — Z853 Personal history of malignant neoplasm of breast: Secondary | ICD-10-CM | POA: Insufficient documentation

## 2023-06-15 DIAGNOSIS — Z923 Personal history of irradiation: Secondary | ICD-10-CM | POA: Insufficient documentation

## 2023-06-15 DIAGNOSIS — N189 Chronic kidney disease, unspecified: Secondary | ICD-10-CM | POA: Insufficient documentation

## 2023-06-15 DIAGNOSIS — K219 Gastro-esophageal reflux disease without esophagitis: Secondary | ICD-10-CM | POA: Insufficient documentation

## 2023-06-15 DIAGNOSIS — Z8711 Personal history of peptic ulcer disease: Secondary | ICD-10-CM | POA: Insufficient documentation

## 2023-06-15 DIAGNOSIS — Z171 Estrogen receptor negative status [ER-]: Secondary | ICD-10-CM | POA: Diagnosis not present

## 2023-06-15 DIAGNOSIS — I493 Ventricular premature depolarization: Secondary | ICD-10-CM | POA: Diagnosis not present

## 2023-06-15 DIAGNOSIS — M129 Arthropathy, unspecified: Secondary | ICD-10-CM | POA: Diagnosis not present

## 2023-06-15 NOTE — Telephone Encounter (Signed)
Patient has been scheduled. Aware of appt date and time.   Scheduling Message Entered by Gery Pray H on 06/03/2023 at  7:31 PM Priority: Routine <No visit type provided>  Department: [facility]  Provider:  Scheduling Notes:  RT 2-3 months with labs

## 2023-06-15 NOTE — Progress Notes (Signed)
Thoracic Location of Tumor / Histology: Right Upper Lobe Lung   Biopsies of RUL Lung 04/19/2023         Past/Anticipated interventions by cardiothoracic surgery, if any:   Past/Anticipated interventions by medical oncology, if any:  Dr. Gilman Buttner 05/14/2023 -I explained we would normally approach this with surgery but I doubt she is a surgical candidate. -We will plan referral to radiation oncology at [facility] in [place] so she can be considered for stereotactic radiation.   Tobacco/Marijuana/Snuff/ETOH use: Current Smoker, 1.5-2 packs per day.  Signs/Symptoms Weight changes, if any: She reports few pound fluctuations in her weight. Respiratory complaints, if any: She reports increased SOB, due to smoking history. Hemoptysis, if any: She reports productive cough with yellowish phlegm. Pain issues, if any:  She reports some chest pain.  SAFETY ISSUES: Prior radiation? Left Breast, [facility] 2024 with Dr. Thersa Salt, ~29 fractions. Pacemaker/ICD? No Possible current pregnancy? Hysterectomy Is the patient on methotrexate? No  Current Complaints / other details:

## 2023-06-15 NOTE — Progress Notes (Signed)
Radiation Oncology         [phone] ________________________________  Name: Lindsay Brooks MRN: [id number]  Date: 06/15/2023  DOB: [birth year]  CC:Lindsay Greenspan, MD  Lindsay Beckwith, MD     REFERRING PHYSICIAN: Dellia Beckwith, MD   DIAGNOSIS: The encounter diagnosis was Primary cancer of right upper lobe of lung (HCC).   HISTORY OF PRESENT ILLNESS::Lindsay Brooks is a 72 y.o. female who is seen for an initial consultation visit regarding the patient's diagnosis of non-small cell lung cancer of the right upper lobe.  The patient was noted to have a 2.5 cm tumor within the right upper lobe on imaging.  PET scan was performed which showed low-level activity at this location with a maximum SUV of 1.8.  Bronchoscopy was performed and this confirmed non-small cell lung cancer.  The patient had an MRI scan of the brain which was negative for any intracranial disease.  The patient has a history of left-sided breast cancer for which she underwent radiation treatment.  She is not a good surgical candidate so I have been asked to see the patient for consideration of stereotactic body radiation treatment.    PREVIOUS RADIATION THERAPY: Yes as above the patient is status post adjuvant radiation treatment for left-sided breast cancer   PAST MEDICAL HISTORY:  has a past medical history of Anemia, Arthritis, CAD (coronary artery disease), CKD (chronic kidney disease), Colon polyps, COPD (chronic obstructive pulmonary disease) (HCC), Depression, GERD (gastroesophageal reflux disease), echocardiogram, Insomnia, Mini stroke, Osteoporosis, Other and unspecified hyperlipidemia, Other isolated or specific phobias, Peripheral vascular disease, unspecified (HCC), Personal history of peptic ulcer disease, Psychiatric pseudoseizure, TIA (transient ischemic attack), Tobacco use disorder, Type II or unspecified type diabetes mellitus without mention of complication, not stated as uncontrolled, and  Unspecified essential hypertension.     PAST SURGICAL HISTORY: Past Surgical History:  Procedure Laterality Date   APPENDECTOMY     BREAST BIOPSY     BRONCHIAL BIOPSY  04/19/2023   Procedure: BRONCHIAL BIOPSIES;  Surgeon: Leslye Peer, MD;  Location: [facility] ENDOSCOPY;  Service: Pulmonary;;   BRONCHIAL BRUSHINGS  04/19/2023   Procedure: BRONCHIAL BRUSHINGS;  Surgeon: Leslye Peer, MD;  Location: [facility] ENDOSCOPY;  Service: Pulmonary;;   BRONCHIAL NEEDLE ASPIRATION BIOPSY  04/19/2023   Procedure: BRONCHIAL NEEDLE ASPIRATION BIOPSIES;  Surgeon: Leslye Peer, MD;  Location: [facility] ENDOSCOPY;  Service: Pulmonary;;   BRONCHIAL WASHINGS  04/19/2023   Procedure: BRONCHIAL WASHINGS;  Surgeon: Leslye Peer, MD;  Location: [facility] ENDOSCOPY;  Service: Pulmonary;;   CARDIAC CATHETERIZATION  2009   patent RCA stent. mild CAD otherwise.    CATARACT EXTRACTION, BILATERAL  2016   CHOLECYSTECTOMY  2009   FIDUCIAL MARKER PLACEMENT  04/19/2023   Procedure: FIDUCIAL MARKER PLACEMENT;  Surgeon: Leslye Peer, MD;  Location: [facility] ENDOSCOPY;  Service: Pulmonary;;   FLEXIBLE SIGMOIDOSCOPY N/A 10/21/2022   Procedure: Arnell Sieving;  Surgeon: Charna Elizabeth, MD;  Location: [facility] ENDOSCOPY;  Service: Gastroenterology;  Laterality: N/A;   hysterectomy (other)     age 79   KNEE ARTHROSCOPY Left    left arm surgery     LEFT HEART CATHETERIZATION WITH CORONARY ANGIOGRAM N/A 01/16/2014   Procedure: LEFT HEART CATHETERIZATION WITH CORONARY ANGIOGRAM;  Surgeon: Micheline Chapman, MD;  Location: [facility] CATH LAB;  Service: Cardiovascular;  Laterality: N/A;   left leg angiography     stenting of the left common iliac artery   left rotator cuff repair     right  coronary artery stent       FAMILY HISTORY: family history includes Allergies in her father and mother; Breast cancer in her daughter, maternal aunt, maternal aunt, and paternal aunt; Coronary artery disease in her brother; Heart attack in her father and mother; Hypertension  in her father; Kidney disease in her father; Melanoma in her sister; Pancreatic cancer in her maternal grandmother.   SOCIAL HISTORY:  reports that she has been smoking cigarettes. She has a 66 pack-year smoking history. She has never used smokeless tobacco. She reports that she does not drink alcohol and does not use drugs.   ALLERGIES: Amoxicillin, Claritin [loratadine], Insulins, Other, Vioxx [rofecoxib], Celecoxib, Liraglutide, and Propranolol   MEDICATIONS:  Current Outpatient Medications  Medication Sig Dispense Refill   acetaminophen (TYLENOL) 500 MG tablet Take 1,000 mg by mouth as needed for moderate pain.     amLODipine (NORVASC) 2.5 MG tablet Take 2.5 mg by mouth 2 (two) times daily.     aspirin EC 81 MG tablet Take 1 tablet (81 mg total) by mouth daily. 30 tablet 11   busPIRone (BUSPAR) 7.5 MG tablet Take 7.5 mg by mouth as needed (anxiety).     busPIRone (BUSPAR) 7.5 MG tablet Take 1 tablet by mouth daily.     calcium carbonate (OS-CAL) 600 MG tablet Take 600 mg by mouth daily.     clopidogrel (PLAVIX) 75 MG tablet Take 1 tablet (75 mg total) by mouth daily.     divalproex (DEPAKOTE) 250 MG DR tablet Take 250 mg by mouth 2 (two) times daily.     donepezil (ARICEPT) 5 MG tablet Take 5 mg by mouth daily.     DULoxetine (CYMBALTA) 60 MG capsule Take 60 mg by mouth at bedtime.     ezetimibe (ZETIA) 10 MG tablet Take 10 mg by mouth daily.     famotidine (PEPCID) 20 MG tablet Take 20 mg by mouth 2 (two) times daily.     fexofenadine (ALLEGRA) 180 MG tablet Take 180 mg by mouth as needed for allergies.     hydrALAZINE (APRESOLINE) 50 MG tablet Take 50 mg by mouth in the morning and at bedtime.     JARDIANCE 25 MG TABS tablet Take 25 mg by mouth daily.     KLOR-CON M10 10 MEQ tablet TAKE 1 TABLET BY MOUTH EVERY DAY 90 tablet 1   LANTUS SOLOSTAR 100 UNIT/ML Solostar Pen Inject 20 Units into the skin at bedtime.     metoprolol tartrate (LOPRESSOR) 25 MG tablet Take 25 mg by mouth 2  (two) times daily.     nitroGLYCERIN (NITROSTAT) 0.4 MG SL tablet Place 0.4 mg under the tongue every 5 (five) minutes as needed. For chest pain     pantoprazole (PROTONIX) 40 MG tablet Take 40 mg by mouth 2 (two) times daily.     PROLIA 60 MG/ML SOSY injection Inject 60 mg into the skin every 6 (six) months.     valsartan-hydrochlorothiazide (DIOVAN-HCT) 320-25 MG tablet TAKE 1 TABLET BY ORAL ROUTE ONCE DAILY FOR HIGH BLOOD PRESSURE     zonisamide (ZONEGRAN) 50 MG capsule Take 200 mg by mouth in the morning and at bedtime.     No current facility-administered medications for this encounter.     REVIEW OF SYSTEMS:  A 15 point review of systems is documented in the electronic medical record. This was obtained by the nursing staff. However, I reviewed this with the patient to discuss relevant findings and make appropriate changes.  Pertinent items  are noted in HPI.    PHYSICAL EXAM:  height is 5\' 1"  (1.549 m) and weight is 118 lb 8 oz (53.8 kg). Her temporal temperature is 96.9 F (36.1 C) (abnormal). Her blood pressure is 114/50 (abnormal) and her pulse is 67. Her respiration is 20 and oxygen saturation is 95%.   ECOG = 1  0 - Asymptomatic (Fully active, able to carry on all predisease activities without restriction)  1 - Symptomatic but completely ambulatory (Restricted in physically strenuous activity but ambulatory and able to carry out work of a light or sedentary nature. For example, light housework, office work)  2 - Symptomatic, <50% in bed during the day (Ambulatory and capable of all self care but unable to carry out any work activities. Up and about more than 50% of waking hours)  3 - Symptomatic, >50% in bed, but not bedbound (Capable of only limited self-care, confined to bed or chair 50% or more of waking hours)  4 - Bedbound (Completely disabled. Cannot carry on any self-care. Totally confined to bed or chair)  5 - Death   Santiago Glad MM, Creech RH, Tormey DC, et al. [phone]).  "Toxicity and response criteria of the [facility] Group". Am. Evlyn Clines. Oncol. 5 (6): 649-55  General: Well-developed, in no acute distress HEENT: Normocephalic, atraumatic Extremities: No edema present Neuro: No focal deficits    LABORATORY DATA:  Lab Results  Component Value Date   WBC 10.7 (H) 06/03/2023   HGB 8.6 (L) 06/03/2023   HCT 29.6 (L) 06/03/2023   MCV 91.9 06/03/2023   PLT 329 06/03/2023   Lab Results  Component Value Date   NA 136 06/03/2023   K 3.5 06/03/2023   CL 104 06/03/2023   CO2 22 06/03/2023   Lab Results  Component Value Date   ALT 9 06/03/2023   AST 15 06/03/2023   ALKPHOS 58 06/03/2023   BILITOT 0.3 06/03/2023      RADIOGRAPHY: No results found.     IMPRESSION/ PLAN:  The patient is seen today for her diagnosis of non-small cell lung cancer, T1 cN0 M0.  Bronchoscopy confirmed non-small cell lung cancer.  Based on the size and the location of the patient's tumor, I tentatively discussed with the patient a recommendation to proceed with a 5 fraction course of SBRT.  We discussed the rationale of treatment as well as possible side effects and risks.  We discussed the logistics of treatment, some of which she is familiar with given her previous course of breast radiation treatment.  All of her questions were answered.  She does wish to proceed with such a treatment.  She will undergo simulation early next week for treatment planning.  The patient was seen in person today in clinic.  The total time spent on the patient's visit today was 50 minutes, including chart review, direct discussion/evaluation with the patient, and coordination of care.        ________________________________   Radene Gunning, MD, PhD   **Disclaimer: This note was dictated with voice recognition software. Similar sounding words can inadvertently be transcribed and this note may contain transcription errors which may not have been corrected upon publication  of note.**

## 2023-06-16 DIAGNOSIS — C3411 Malignant neoplasm of upper lobe, right bronchus or lung: Secondary | ICD-10-CM | POA: Diagnosis not present

## 2023-06-17 DIAGNOSIS — E1169 Type 2 diabetes mellitus with other specified complication: Secondary | ICD-10-CM | POA: Diagnosis not present

## 2023-06-17 DIAGNOSIS — I10 Essential (primary) hypertension: Secondary | ICD-10-CM | POA: Diagnosis not present

## 2023-06-21 ENCOUNTER — Ambulatory Visit
Admission: RE | Admit: 2023-06-21 | Discharge: 2023-06-21 | Disposition: A | Discharge status: Home / Self Care | Payer: Medicare Other | Source: Ambulatory Visit | Attending: Radiation Oncology | Admitting: Radiation Oncology

## 2023-06-21 DIAGNOSIS — Z510 Encounter for antineoplastic radiation therapy: Secondary | ICD-10-CM | POA: Insufficient documentation

## 2023-06-21 DIAGNOSIS — C3411 Malignant neoplasm of upper lobe, right bronchus or lung: Secondary | ICD-10-CM | POA: Diagnosis not present

## 2023-06-22 DIAGNOSIS — U071 COVID-19: Secondary | ICD-10-CM | POA: Diagnosis not present

## 2023-06-22 DIAGNOSIS — R0603 Acute respiratory distress: Secondary | ICD-10-CM | POA: Diagnosis not present

## 2023-06-25 ENCOUNTER — Encounter: Payer: Self-pay | Admitting: Oncology

## 2023-06-25 ENCOUNTER — Telehealth: Payer: Self-pay

## 2023-06-25 DIAGNOSIS — D509 Iron deficiency anemia, unspecified: Secondary | ICD-10-CM | POA: Insufficient documentation

## 2023-06-25 NOTE — Telephone Encounter (Signed)
-----   Message from Dellia Beckwith sent at 06/25/2023  9:48 AM EST ----- Regarding: call Call daughter Casimer Bilis to tell her labs show severe anemia and severe iron def.  We can try oral supp 1 bid but constipating and I am concerned about compliance.  She would prob benefit from IV iron, let me know if they are interested.  She is also mildly dehydrated, encourage oral fluids.  Rest looks good, incl BS 100 and K is just barely normal at 3.5 but better than last time.  I think we need to recheck in December.

## 2023-06-25 NOTE — Telephone Encounter (Signed)
Attempted to contact patient. No answer. 

## 2023-07-01 DIAGNOSIS — Z510 Encounter for antineoplastic radiation therapy: Secondary | ICD-10-CM | POA: Diagnosis not present

## 2023-07-01 DIAGNOSIS — C3411 Malignant neoplasm of upper lobe, right bronchus or lung: Secondary | ICD-10-CM | POA: Diagnosis not present

## 2023-07-05 ENCOUNTER — Encounter: Payer: Self-pay | Admitting: Oncology

## 2023-07-05 ENCOUNTER — Ambulatory Visit: Payer: Medicare Other | Admitting: Radiation Oncology

## 2023-07-05 ENCOUNTER — Telehealth: Payer: Self-pay

## 2023-07-05 NOTE — Addendum Note (Signed)
Addended by: Domenic Schwab on: 07/05/2023 01:12 PM   Modules accepted: Orders

## 2023-07-05 NOTE — Telephone Encounter (Signed)
Patients daughter states she was giving mom her oral iron supplement but patient did not like it made her feel. So daughter would like iron infusions to be set up for patient.  Per daughter patient had another dizzy and unresponsive spell this morning. Patient refused EMS but daughter is watching patient and will call EMS if it gets worse.

## 2023-07-05 NOTE — Telephone Encounter (Signed)
-----  Message from Dellia Beckwith sent at 06/25/2023  9:48 AM EST ----- Regarding: call Call daughter Casimer Bilis to tell her labs show severe anemia and severe iron def.  We can try oral supp 1 bid but constipating and I am concerned about compliance.  She would prob benefit from IV iron, let me know if they are interested.  She is also mildly dehydrated, encourage oral fluids.  Rest looks good, incl BS 100 and K is just barely normal at 3.5 but better than last time.  I think we need to recheck in December.

## 2023-07-06 ENCOUNTER — Ambulatory Visit: Payer: Medicare Other | Admitting: Radiation Oncology

## 2023-07-07 ENCOUNTER — Ambulatory Visit: Payer: Medicare Other | Admitting: Radiation Oncology

## 2023-07-11 DIAGNOSIS — E1169 Type 2 diabetes mellitus with other specified complication: Secondary | ICD-10-CM | POA: Diagnosis not present

## 2023-07-11 DIAGNOSIS — E785 Hyperlipidemia, unspecified: Secondary | ICD-10-CM | POA: Diagnosis not present

## 2023-07-12 ENCOUNTER — Other Ambulatory Visit: Payer: Self-pay

## 2023-07-12 ENCOUNTER — Emergency Department (HOSPITAL_COMMUNITY): Payer: Medicare Other

## 2023-07-12 ENCOUNTER — Encounter (HOSPITAL_COMMUNITY): Payer: Self-pay | Admitting: Radiology

## 2023-07-12 ENCOUNTER — Ambulatory Visit: Payer: Medicare Other | Admitting: Radiation Oncology

## 2023-07-12 ENCOUNTER — Inpatient Hospital Stay (HOSPITAL_COMMUNITY)
Admission: EM | Admit: 2023-07-12 | Discharge: 2023-07-15 | DRG: 101 | Disposition: A | Discharge status: Home Health | Payer: Medicare Other | Attending: Family Medicine | Admitting: Family Medicine

## 2023-07-12 DIAGNOSIS — E785 Hyperlipidemia, unspecified: Secondary | ICD-10-CM | POA: Diagnosis not present

## 2023-07-12 DIAGNOSIS — Z17421 Hormone receptor negative with human epidermal growth factor receptor 2 negative status: Secondary | ICD-10-CM | POA: Diagnosis not present

## 2023-07-12 DIAGNOSIS — D508 Other iron deficiency anemias: Secondary | ICD-10-CM | POA: Diagnosis not present

## 2023-07-12 DIAGNOSIS — Z23 Encounter for immunization: Secondary | ICD-10-CM

## 2023-07-12 DIAGNOSIS — J449 Chronic obstructive pulmonary disease, unspecified: Secondary | ICD-10-CM | POA: Diagnosis not present

## 2023-07-12 DIAGNOSIS — C50412 Malignant neoplasm of upper-outer quadrant of left female breast: Secondary | ICD-10-CM | POA: Diagnosis not present

## 2023-07-12 DIAGNOSIS — I2510 Atherosclerotic heart disease of native coronary artery without angina pectoris: Secondary | ICD-10-CM | POA: Diagnosis not present

## 2023-07-12 DIAGNOSIS — C3411 Malignant neoplasm of upper lobe, right bronchus or lung: Secondary | ICD-10-CM | POA: Diagnosis not present

## 2023-07-12 DIAGNOSIS — R29818 Other symptoms and signs involving the nervous system: Secondary | ICD-10-CM | POA: Diagnosis not present

## 2023-07-12 DIAGNOSIS — Z9221 Personal history of antineoplastic chemotherapy: Secondary | ICD-10-CM

## 2023-07-12 DIAGNOSIS — D638 Anemia in other chronic diseases classified elsewhere: Secondary | ICD-10-CM | POA: Diagnosis not present

## 2023-07-12 DIAGNOSIS — Z91199 Patient's noncompliance with other medical treatment and regimen due to unspecified reason: Secondary | ICD-10-CM

## 2023-07-12 DIAGNOSIS — R569 Unspecified convulsions: Secondary | ICD-10-CM | POA: Diagnosis not present

## 2023-07-12 DIAGNOSIS — Z800 Family history of malignant neoplasm of digestive organs: Secondary | ICD-10-CM

## 2023-07-12 DIAGNOSIS — E1122 Type 2 diabetes mellitus with diabetic chronic kidney disease: Secondary | ICD-10-CM | POA: Diagnosis present

## 2023-07-12 DIAGNOSIS — I951 Orthostatic hypotension: Secondary | ICD-10-CM | POA: Diagnosis not present

## 2023-07-12 DIAGNOSIS — E876 Hypokalemia: Secondary | ICD-10-CM | POA: Diagnosis present

## 2023-07-12 DIAGNOSIS — Z794 Long term (current) use of insulin: Secondary | ICD-10-CM | POA: Diagnosis not present

## 2023-07-12 DIAGNOSIS — N182 Chronic kidney disease, stage 2 (mild): Secondary | ICD-10-CM | POA: Diagnosis present

## 2023-07-12 DIAGNOSIS — I129 Hypertensive chronic kidney disease with stage 1 through stage 4 chronic kidney disease, or unspecified chronic kidney disease: Secondary | ICD-10-CM | POA: Diagnosis present

## 2023-07-12 DIAGNOSIS — Z8249 Family history of ischemic heart disease and other diseases of the circulatory system: Secondary | ICD-10-CM

## 2023-07-12 DIAGNOSIS — K219 Gastro-esophageal reflux disease without esophagitis: Secondary | ICD-10-CM | POA: Diagnosis not present

## 2023-07-12 DIAGNOSIS — Z880 Allergy status to penicillin: Secondary | ICD-10-CM

## 2023-07-12 DIAGNOSIS — Z7902 Long term (current) use of antithrombotics/antiplatelets: Secondary | ICD-10-CM

## 2023-07-12 DIAGNOSIS — I700 Atherosclerosis of aorta: Secondary | ICD-10-CM | POA: Diagnosis not present

## 2023-07-12 DIAGNOSIS — R7989 Other specified abnormal findings of blood chemistry: Secondary | ICD-10-CM | POA: Diagnosis not present

## 2023-07-12 DIAGNOSIS — Z803 Family history of malignant neoplasm of breast: Secondary | ICD-10-CM

## 2023-07-12 DIAGNOSIS — E041 Nontoxic single thyroid nodule: Secondary | ICD-10-CM | POA: Diagnosis present

## 2023-07-12 DIAGNOSIS — Z9842 Cataract extraction status, left eye: Secondary | ICD-10-CM

## 2023-07-12 DIAGNOSIS — Z9841 Cataract extraction status, right eye: Secondary | ICD-10-CM

## 2023-07-12 DIAGNOSIS — R200 Anesthesia of skin: Secondary | ICD-10-CM | POA: Diagnosis not present

## 2023-07-12 DIAGNOSIS — R2990 Unspecified symptoms and signs involving the nervous system: Secondary | ICD-10-CM

## 2023-07-12 DIAGNOSIS — F17210 Nicotine dependence, cigarettes, uncomplicated: Secondary | ICD-10-CM | POA: Diagnosis not present

## 2023-07-12 DIAGNOSIS — R55 Syncope and collapse: Secondary | ICD-10-CM | POA: Diagnosis present

## 2023-07-12 DIAGNOSIS — Z886 Allergy status to analgesic agent status: Secondary | ICD-10-CM

## 2023-07-12 DIAGNOSIS — Z8711 Personal history of peptic ulcer disease: Secondary | ICD-10-CM

## 2023-07-12 DIAGNOSIS — I672 Cerebral atherosclerosis: Secondary | ICD-10-CM | POA: Diagnosis not present

## 2023-07-12 DIAGNOSIS — Z79899 Other long term (current) drug therapy: Secondary | ICD-10-CM

## 2023-07-12 DIAGNOSIS — K922 Gastrointestinal hemorrhage, unspecified: Secondary | ICD-10-CM | POA: Diagnosis not present

## 2023-07-12 DIAGNOSIS — Z7982 Long term (current) use of aspirin: Secondary | ICD-10-CM

## 2023-07-12 DIAGNOSIS — J984 Other disorders of lung: Secondary | ICD-10-CM | POA: Diagnosis not present

## 2023-07-12 DIAGNOSIS — Z90710 Acquired absence of both cervix and uterus: Secondary | ICD-10-CM

## 2023-07-12 DIAGNOSIS — M810 Age-related osteoporosis without current pathological fracture: Secondary | ICD-10-CM | POA: Diagnosis not present

## 2023-07-12 DIAGNOSIS — Z888 Allergy status to other drugs, medicaments and biological substances status: Secondary | ICD-10-CM

## 2023-07-12 DIAGNOSIS — Z853 Personal history of malignant neoplasm of breast: Secondary | ICD-10-CM

## 2023-07-12 DIAGNOSIS — Z8673 Personal history of transient ischemic attack (TIA), and cerebral infarction without residual deficits: Secondary | ICD-10-CM

## 2023-07-12 DIAGNOSIS — I6782 Cerebral ischemia: Secondary | ICD-10-CM | POA: Diagnosis not present

## 2023-07-12 DIAGNOSIS — Z471 Aftercare following joint replacement surgery: Secondary | ICD-10-CM | POA: Diagnosis not present

## 2023-07-12 DIAGNOSIS — Z8489 Family history of other specified conditions: Secondary | ICD-10-CM

## 2023-07-12 DIAGNOSIS — R911 Solitary pulmonary nodule: Secondary | ICD-10-CM | POA: Diagnosis not present

## 2023-07-12 DIAGNOSIS — Z9181 History of falling: Secondary | ICD-10-CM

## 2023-07-12 DIAGNOSIS — C3490 Malignant neoplasm of unspecified part of unspecified bronchus or lung: Secondary | ICD-10-CM | POA: Diagnosis not present

## 2023-07-12 DIAGNOSIS — Z955 Presence of coronary angioplasty implant and graft: Secondary | ICD-10-CM

## 2023-07-12 DIAGNOSIS — F0394 Unspecified dementia, unspecified severity, with anxiety: Secondary | ICD-10-CM | POA: Diagnosis present

## 2023-07-12 DIAGNOSIS — E1151 Type 2 diabetes mellitus with diabetic peripheral angiopathy without gangrene: Secondary | ICD-10-CM | POA: Diagnosis present

## 2023-07-12 DIAGNOSIS — Z9049 Acquired absence of other specified parts of digestive tract: Secondary | ICD-10-CM

## 2023-07-12 DIAGNOSIS — I10 Essential (primary) hypertension: Secondary | ICD-10-CM | POA: Diagnosis not present

## 2023-07-12 DIAGNOSIS — Z7984 Long term (current) use of oral hypoglycemic drugs: Secondary | ICD-10-CM

## 2023-07-12 DIAGNOSIS — R4182 Altered mental status, unspecified: Principal | ICD-10-CM

## 2023-07-12 DIAGNOSIS — Z923 Personal history of irradiation: Secondary | ICD-10-CM

## 2023-07-12 DIAGNOSIS — Z808 Family history of malignant neoplasm of other organs or systems: Secondary | ICD-10-CM

## 2023-07-12 DIAGNOSIS — R4020 Unspecified coma: Secondary | ICD-10-CM

## 2023-07-12 DIAGNOSIS — Z860100 Personal history of colon polyps, unspecified: Secondary | ICD-10-CM

## 2023-07-12 DIAGNOSIS — Z841 Family history of disorders of kidney and ureter: Secondary | ICD-10-CM

## 2023-07-12 LAB — DIFFERENTIAL
Abs Immature Granulocytes: 0.09 10*3/uL — ABNORMAL HIGH (ref 0.00–0.07)
Basophils Absolute: 0 10*3/uL (ref 0.0–0.1)
Basophils Relative: 0 %
Eosinophils Absolute: 0.1 10*3/uL (ref 0.0–0.5)
Eosinophils Relative: 1 %
Immature Granulocytes: 1 %
Lymphocytes Relative: 35 %
Lymphs Abs: 2.8 10*3/uL (ref 0.7–4.0)
Monocytes Absolute: 0.4 10*3/uL (ref 0.1–1.0)
Monocytes Relative: 5 %
Neutro Abs: 4.5 10*3/uL (ref 1.7–7.7)
Neutrophils Relative %: 58 %

## 2023-07-12 LAB — CBC
HCT: 33.6 % — ABNORMAL LOW (ref 36.0–46.0)
HCT: 28.8 % — ABNORMAL LOW (ref 36.0–46.0)
Hemoglobin: 9.8 g/dL — ABNORMAL LOW (ref 12.0–15.0)
Hemoglobin: 8.5 g/dL — ABNORMAL LOW (ref 12.0–15.0)
MCH: 24.6 pg — ABNORMAL LOW (ref 26.0–34.0)
MCH: 24.6 pg — ABNORMAL LOW (ref 26.0–34.0)
MCHC: 29.2 g/dL — ABNORMAL LOW (ref 30.0–36.0)
MCHC: 29.5 g/dL — ABNORMAL LOW (ref 30.0–36.0)
MCV: 84.4 fL (ref 80.0–100.0)
MCV: 83.5 fL (ref 80.0–100.0)
Platelets: 307 10*3/uL (ref 150–400)
Platelets: 232 10*3/uL (ref 150–400)
RBC: 3.98 MIL/uL (ref 3.87–5.11)
RBC: 3.45 MIL/uL — ABNORMAL LOW (ref 3.87–5.11)
RDW: 18.4 % — ABNORMAL HIGH (ref 11.5–15.5)
RDW: 18.5 % — ABNORMAL HIGH (ref 11.5–15.5)
WBC: 7.9 10*3/uL (ref 4.0–10.5)
WBC: 4.9 10*3/uL (ref 4.0–10.5)
nRBC: 0 % (ref 0.0–0.2)
nRBC: 0 % (ref 0.0–0.2)

## 2023-07-12 LAB — CREATININE, SERUM
Creatinine, Ser: 1.1 mg/dL — ABNORMAL HIGH (ref 0.44–1.00)
GFR, Estimated: 53 mL/min — ABNORMAL LOW (ref >60)

## 2023-07-12 LAB — COMPREHENSIVE METABOLIC PANEL WITH GFR
ALT: 12 U/L (ref 0–44)
AST: 23 U/L (ref 15–41)
Albumin: 3.6 g/dL (ref 3.5–5.0)
Alkaline Phosphatase: 71 U/L (ref 38–126)
Anion gap: 7 (ref 5–15)
BUN: 40 mg/dL — ABNORMAL HIGH (ref 8–23)
CO2: 23 mmol/L (ref 22–32)
Calcium: 9 mg/dL (ref 8.9–10.3)
Chloride: 109 mmol/L (ref 98–111)
Creatinine, Ser: 1.28 mg/dL — ABNORMAL HIGH (ref 0.44–1.00)
GFR, Estimated: 45 mL/min — ABNORMAL LOW
Glucose, Bld: 125 mg/dL — ABNORMAL HIGH (ref 70–99)
Potassium: 4.1 mmol/L (ref 3.5–5.1)
Sodium: 139 mmol/L (ref 135–145)
Total Bilirubin: 0.4 mg/dL
Total Protein: 6.9 g/dL (ref 6.5–8.1)

## 2023-07-12 LAB — URINALYSIS, ROUTINE W REFLEX MICROSCOPIC
Bilirubin Urine: NEGATIVE
Glucose, UA: 50 mg/dL — AB
Hgb urine dipstick: NEGATIVE
Ketones, ur: NEGATIVE mg/dL
Leukocytes,Ua: NEGATIVE
Nitrite: NEGATIVE
Protein, ur: NEGATIVE mg/dL
Specific Gravity, Urine: >1.046 — ABNORMAL HIGH (ref 1.005–1.030)
pH: 5 (ref 5.0–8.0)

## 2023-07-12 LAB — ETHANOL: Alcohol, Ethyl (B): <10 mg/dL (ref <10)

## 2023-07-12 LAB — RAPID URINE DRUG SCREEN, HOSP PERFORMED
Amphetamines: NOT DETECTED
Barbiturates: NOT DETECTED
Benzodiazepines: NOT DETECTED
Cocaine: NOT DETECTED
Opiates: NOT DETECTED
Tetrahydrocannabinol: NOT DETECTED

## 2023-07-12 LAB — CBG MONITORING, ED
Glucose-Capillary: 108 mg/dL — ABNORMAL HIGH (ref 70–99)
Glucose-Capillary: 84 mg/dL (ref 70–99)

## 2023-07-12 LAB — PROTIME-INR
INR: 1.1 (ref 0.8–1.2)
Prothrombin Time: 14.4 s (ref 11.4–15.2)

## 2023-07-12 LAB — I-STAT CHEM 8, ED
BUN: 35 mg/dL — ABNORMAL HIGH (ref 8–23)
Calcium, Ion: 1.2 mmol/L (ref 1.15–1.40)
Chloride: 110 mmol/L (ref 98–111)
Creatinine, Ser: 1.4 mg/dL — ABNORMAL HIGH (ref 0.44–1.00)
Glucose, Bld: 120 mg/dL — ABNORMAL HIGH (ref 70–99)
HCT: 34 % — ABNORMAL LOW (ref 36.0–46.0)
Hemoglobin: 11.6 g/dL — ABNORMAL LOW (ref 12.0–15.0)
Potassium: 4.4 mmol/L (ref 3.5–5.1)
Sodium: 142 mmol/L (ref 135–145)
TCO2: 20 mmol/L — ABNORMAL LOW (ref 22–32)

## 2023-07-12 LAB — APTT: aPTT: 25 s (ref 24–36)

## 2023-07-12 MED ORDER — INSULIN ASPART 100 UNIT/ML IJ SOLN
0.0000 [IU] | Freq: Every day | INTRAMUSCULAR | Status: DC
  Filled 2023-07-12: qty 0.05

## 2023-07-12 MED ORDER — BUSPIRONE HCL 5 MG PO TABS
7.5000 mg | ORAL_TABLET | Freq: Every day | ORAL | Status: DC
  Administered 2023-07-13 – 2023-07-15 (×3): 7.5 mg via ORAL
  Filled 2023-07-12: qty 1.5
  Filled 2023-07-12 (×2): qty 2

## 2023-07-12 MED ORDER — SODIUM CHLORIDE 0.9% FLUSH
3.0000 mL | Freq: Two times a day (BID) | INTRAVENOUS | Status: DC
  Administered 2023-07-12 – 2023-07-15 (×5): 3 mL via INTRAVENOUS

## 2023-07-12 MED ORDER — SODIUM CHLORIDE 0.9 % IV BOLUS
500.0000 mL | Freq: Once | INTRAVENOUS | Status: AC
  Administered 2023-07-12: 500 mL via INTRAVENOUS

## 2023-07-12 MED ORDER — DIVALPROEX SODIUM 250 MG PO DR TAB: 250.0000 mg | DELAYED_RELEASE_TABLET | Freq: Two times a day (BID) | ORAL | Status: DC

## 2023-07-12 MED ORDER — POLYETHYLENE GLYCOL 3350 17 G PO PACK: 17.0000 g | PACK | Freq: Every day | ORAL | Status: DC | PRN

## 2023-07-12 MED ORDER — IOHEXOL 350 MG/ML SOLN
100.0000 mL | Freq: Once | INTRAVENOUS | Status: AC | PRN
  Administered 2023-07-12: 100 mL via INTRAVENOUS

## 2023-07-12 MED ORDER — LORAZEPAM 2 MG/ML IJ SOLN
1.0000 mg | Freq: Once | INTRAMUSCULAR | Status: AC
  Administered 2023-07-12: 1 mg via INTRAVENOUS
  Filled 2023-07-12: qty 1

## 2023-07-12 MED ORDER — SODIUM CHLORIDE 0.9 % IV SOLN
100.0000 mL/h | INTRAVENOUS | Status: DC
  Administered 2023-07-12 (×2): 100 mL/h via INTRAVENOUS

## 2023-07-12 MED ORDER — PANTOPRAZOLE SODIUM 40 MG IV SOLR
40.0000 mg | INTRAVENOUS | Status: DC
  Administered 2023-07-12: 40 mg via INTRAVENOUS
  Filled 2023-07-12: qty 10

## 2023-07-12 MED ORDER — PANTOPRAZOLE SODIUM 20 MG PO TBEC: 20.0000 mg | DELAYED_RELEASE_TABLET | Freq: Every day | ORAL | Status: DC

## 2023-07-12 MED ORDER — ASPIRIN 81 MG PO CHEW
81.0000 mg | CHEWABLE_TABLET | Freq: Every day | ORAL | Status: DC
  Administered 2023-07-13 – 2023-07-15 (×3): 81 mg via ORAL
  Filled 2023-07-12 (×3): qty 1

## 2023-07-12 MED ORDER — ACETAMINOPHEN 325 MG PO TABS: 650.0000 mg | ORAL_TABLET | Freq: Four times a day (QID) | ORAL | Status: DC | PRN

## 2023-07-12 MED ORDER — METOPROLOL TARTRATE 5 MG/5ML IV SOLN
5.0000 mg | Freq: Four times a day (QID) | INTRAVENOUS | Status: DC
  Filled 2023-07-12: qty 5

## 2023-07-12 MED ORDER — ENOXAPARIN SODIUM 30 MG/0.3ML IJ SOSY
30.0000 mg | PREFILLED_SYRINGE | INTRAMUSCULAR | Status: DC
  Administered 2023-07-13 – 2023-07-15 (×3): 30 mg via SUBCUTANEOUS
  Filled 2023-07-12 (×3): qty 0.3

## 2023-07-12 MED ORDER — INSULIN GLARGINE-YFGN 100 UNIT/ML ~~LOC~~ SOLN
10.0000 [IU] | Freq: Every day | SUBCUTANEOUS | Status: DC
  Administered 2023-07-12: 10 [IU] via SUBCUTANEOUS
  Filled 2023-07-12 (×2): qty 0.1

## 2023-07-12 MED ORDER — ZONISAMIDE 100 MG PO CAPS
200.0000 mg | ORAL_CAPSULE | Freq: Every day | ORAL | Status: DC
  Administered 2023-07-13 – 2023-07-14 (×2): 200 mg via ORAL
  Filled 2023-07-12 (×4): qty 2

## 2023-07-12 MED ORDER — METOPROLOL TARTRATE 25 MG PO TABS: 25.0000 mg | ORAL_TABLET | Freq: Two times a day (BID) | ORAL | Status: DC

## 2023-07-12 MED ORDER — ACETAMINOPHEN 650 MG RE SUPP: 650.0000 mg | Freq: Four times a day (QID) | RECTAL | Status: DC | PRN

## 2023-07-12 MED ORDER — INSULIN GLARGINE-YFGN 100 UNIT/ML ~~LOC~~ SOLN
20.0000 [IU] | Freq: Every day | SUBCUTANEOUS | Status: DC
  Filled 2023-07-12: qty 0.2

## 2023-07-12 MED ORDER — INSULIN ASPART 100 UNIT/ML IJ SOLN
0.0000 [IU] | Freq: Three times a day (TID) | INTRAMUSCULAR | Status: DC
  Administered 2023-07-14: 1 [IU] via SUBCUTANEOUS
  Filled 2023-07-12: qty 0.09

## 2023-07-12 MED ORDER — VALPROATE SODIUM 100 MG/ML IV SOLN
125.0000 mg | Freq: Four times a day (QID) | INTRAVENOUS | Status: DC
  Administered 2023-07-12 – 2023-07-14 (×6): 125 mg via INTRAVENOUS
  Filled 2023-07-12 (×11): qty 1.25

## 2023-07-12 NOTE — Progress Notes (Signed)
Poc glucose 86. Reduce semeglee to 10 units for tonight.

## 2023-07-12 NOTE — Progress Notes (Signed)
Patient failed her swallow screen.  Metoprolol, valproic acid as well as pantoprazole changed to IV formulations.

## 2023-07-12 NOTE — Consult Note (Signed)
[facility] Consult Services   Patient Name:   Lindsay Brooks, Lindsay Brooks Date of Birth:   14-Dec-1950 Identification Number:   MRN - [id number] Date of Service:   07/12/2023 14:56:56  Diagnosis:       R41.0 - Disorientation, unspecified  Impression:      72yo woman w/PMH of HTN, DM, HLD, COPD, lung cancer, breast cancer, pseudoseizures p/w AMS, weakness/numbness on 07/12/23. Her family at bedside reports this morning she had an episode of being non-responsive lasting 30 minutes. This reportedly happens three times a week. Later they were in the car when she reported hand numbness so they came to the ED. In the ED, she reported right leg weakness, In CT, she had seizure-like activity lasting 30 seconds and received Ativan. During interview, she became poorly responsive and unable to provide history or follow commands. NIHSS 14, exam is constantly fluctuating. CT Head shows possible age indeterminate right cerebellar infarct. Pt is not a candidate for thrombolytics due to possible recent stroke on CT Head. CTA Head/Neck shows no large vessel occlusion. Differential for this presentation includes but is not limited to acute ischemic stroke, complex partial seizure, pseudoseizure, syncope, infectious or metabolic encephalopathy, delirium. Plan listed below was recommended to the ED physician by phone.  Our recommendations are outlined below.  Recommendations:        Stroke/Telemetry Floor       Neuro Checks       Bedside Swallow Eval       DVT Prophylaxis       IV Fluids, Normal Saline       Head of Bed 30 Degrees       Euglycemia and Avoid Hyperthermia (PRN Acetaminophen)       Continue with Aspirin 81 mg daily and Clopidogrel 75 mg daily.       Antihypertensives PRN if Blood pressure is greater than 220/120 or there is a concern for End organ damage/contraindications for permissive HTN. If blood pressure is greater than 220/120 give labetalol PO or IV or Vasotec IV with  a goal of 15% reduction in BP during the first 24 hours.       MRI Brain without contrast to assess for stroke       EEG       Lipid Profile, A1C       PT/OT, Speech/Swallow evaluation  Sign Out:       Discussed with Emergency Department Provider    ------------------------------------------------------------------------------  Advanced Imaging: CTA Head and Neck Completed.  CTP Completed.  LVO:No  Patient in not a candidate for NIR   Metrics: Last Known Well: 07/12/2023 14:15:00 Dispatch Time: 07/12/2023 14:56:56 Arrival Time: 07/12/2023 14:31:00 Initial Response Time: 07/12/2023 14:59:57 Symptoms: AMS, weakness/numbness. Initial patient interaction: 07/12/2023 15:13:51 NIHSS Assessment Completed: 07/12/2023 15:24:48 Patient is not a candidate for Thrombolytic. Thrombolytic Medical Decision: 07/12/2023 15:24:50 Patient was not deemed candidate for Thrombolytic because of following reasons: Significant head trauma or stroke in previous 3 months .  CT head showed no acute hemorrhage or acute core infarct.  Primary Provider Notified of Diagnostic Impression and Management Plan on: 07/12/2023 16:55:04    ------------------------------------------------------------------------------  History of Present Illness: Patient is a 72 year old Female.  Patient was brought by private transportation with symptoms of AMS, weakness/numbness. 72yo woman w/PMH of HTN, DM, HLD, COPD, lung cancer, breast cancer, pseudoseizures p/w AMS, weakness/numbness on 07/12/23. Her family at bedside reports this morning she had an episode of being non-responsive lasting 30 minutes. This reportedly happens three  times a week. Later they were in the car when she reported hand numbness so they came to the ED. In the ED, she reported right leg weakness, In CT, she had seizure-like activity lasting 30 seconds and received Ativan. During interview, she became poorly responsive and unable to provide history  or follow commands.   Past Medical History: Other PMH:  see hpi unable to obtain due to:   Patient Is Confused  Medications:  No Anticoagulant use  Antiplatelet use: Yes aspirin, plavix Reviewed EMR for current medications  Allergies:  Reviewed Allergies Unable To Obtain Due To: Patient Is Confused  Social History: Unable To Obtain Due To Patient Status : Patient Is Confused  Family History:  Family History Cannot Be Obtained Because:Patient Is Confused  ROS : ROS Cannot Be Obtained Because:  Patient Is Confused  Past Surgical History: Past Surgical History Cannot Be Obtained Because: Patient Is Confused    Examination: BP(129/63), Pulse(80), 1A: Level of Consciousness - Requires repeated stimulation to arouse + 2 1B: Ask Month and Age - Could Not Answer Either Question Correctly + 2 1C: Blink Eyes & Squeeze Hands - Performs Both Tasks + 0 2: Test Horizontal Extraocular Movements - Normal + 0 3: Test Visual Fields - No Visual Loss + 0 4: Test Facial Palsy (Use Grimace if Obtunded) - Normal symmetry + 0 5A: Test Left Arm Motor Drift - Some Effort Against Gravity + 2 5B: Test Right Arm Motor Drift - Some Effort Against Gravity + 2 6A: Test Left Leg Motor Drift - Drift, but doesn't hit bed + 1 6B: Test Right Leg Motor Drift - Drift, but doesn't hit bed + 1 7: Test Limb Ataxia (FNF/Heel-Shin) - No Ataxia + 0 8: Test Sensation - Complete Loss: Cannot Sense Being Touched At All + 2 9: Test Language/Aphasia - Severe Aphasia: Fragmentary Expression, Inference Needed, Cannot Identify Materials + 2 10: Test Dysarthria - Normal + 0 11: Test Extinction/Inattention - No abnormality + 0  NIHSS Score: 14   Pre-Morbid Modified Rankin Scale: 0 Points = No symptoms at all  Spoke with : ED MD Tegeler  This consult was conducted in real time using interactive audio and Immunologist. Patient was informed of the technology being used for this visit and agreed to proceed.  Patient located in hospital and provider located at home/office setting.   Patient is being evaluated for possible acute neurologic impairment and high probability of imminent or life-threatening deterioration. I spent total of 35 minutes providing care to this patient, including time for face to face visit via telemedicine, review of medical records, imaging studies and discussion of findings with providers, the patient and/or family.   Dr Gwenyth Bender   [facility] For Inpatient follow-up with [facility] physician please call [facility] at [phone]. As we are not an outpatient service for any post hospital discharge needs please contact the hospital for assistance. If you have any questions for the [facility] physicians or need to reconsult for clinical or diagnostic changes please contact us via [facility] at [phone].

## 2023-07-12 NOTE — Progress Notes (Signed)
[facility] RN Code Stroke Note  1438- Code stroke cart activation 1443- To CT 1456- Pt Returned from CT 1507- Dr Caprice Renshaw on camera  1524- NIH =14, MRS 0

## 2023-07-12 NOTE — ED Notes (Signed)
Pt to CT

## 2023-07-12 NOTE — Assessment & Plan Note (Signed)
Per radonc note on 06/15/2023:  diagnosis of non-small cell lung cancer of the right upper lobe.  The patient was noted to have a 2.5 cm tumor within the right upper lobe on imaging.  PET scan was performed which showed low-level activity at this location with a maximum SUV of 1.8.  Bronchoscopy was performed and this confirmed non-small cell lung cancer.  The patient had an MRI scan of the brain which was negative for any intracranial disease.  The patient has a history of left-sided breast cancer for which she underwent radiation treatment.  She is not a good surgical candidate so I have been asked to see the patient for consideration of stereotactic body radiation treatment.

## 2023-07-12 NOTE — ED Notes (Signed)
Pt back from MRI,  Pt had drop in ox saturation to 60's    Applied 2L [place] with a rebound to 100 Unsure if a true desat or reading error

## 2023-07-12 NOTE — Assessment & Plan Note (Addendum)
Presentation of left arm/hand numbness leg tingling associated with tongue twisting as well as change in speech.  Followed by seizure-like activity in the CAT scan room in the ER.  See HPI for full details.  With prompt return to neurologic baseline afterwards.  In the setting of prior several episodes of similar presentations in the past as per daughter.  MRI at this time not showing any acute changes.  Although initial CAT scan showed concern of left cerebellar infarction.  Patient is s/p code stroke.  EEG is pending.  I will give patient diet after she passes swallow screen.  Patient being admitted to [facility] with plan for neurology to continue follow-up and advise.

## 2023-07-12 NOTE — Assessment & Plan Note (Signed)
occured around noon time, self-limited, again in a pattern of recurrent episodes about 3 times a week.  I will do limited syncope workup with telemetry echo, insulin glucose monitoring.

## 2023-07-12 NOTE — H&P (Addendum)
History and Physical    Patient: Lindsay Brooks WGN:[id number] DOB: 10-15-1950 DOA: 07/12/2023 DOS: the patient was seen and examined on 07/12/2023 PCP: [name], MD  Patient coming from: Home  Chief Complaint:  Chief Complaint  Patient presents with   Altered Mental Status   Code Stroke   HPI: Lindsay Brooks is a 72 y.o. female with medical history significant of multiple medical problems as listed below including described chronic respiratory failure for which patient is on 2 L/min of supplementary oxygen.  Patient's primary historian is daughter at the bedside.  Although patient also corroborates the history given.  Patient has had about 2 months of on and on episodes of loss of consciousness that are related to exertion without any hypoxemia below 90% being documented by family despite several attempts.  These happen about every 3 times a week and promptly resolved after administration of oxygen and rest.  No trauma is reported.  Further for about a year patient has had episodes of left arm/hand tingling associated with twisting of the tongue.  This happens about once every week and self resolves.  For above complaint, daughter describes that the patient has had multiple neurology evaluations.  Today patient was coming to [facility] for radiation therapy for her lung cancer.  At approximately 2:20 PM, while patient was being driven by daughter, patient had recurrence of left arm numbness/tingling with associated twisting of the tongue which is demonstrated to me by the daughter with associated alteration of speech.  Since patient was so close to the [facility] system patient was diverted to the ER.  Triage note indicates that the patient was initially found to be nonverbal, looking around.  However patient slowly gained more alert and interaction during initial evaluation.  Patient was transported to CAT scan room where there was seizure-like activity documented.  Regardless patient  gained neurologic baseline in next several minutes in the hospital.  And patient has remained asymptomatic since then.  Patient also had transient loss of consciousness at approximately noon time today after she had bathed in a pattern similar to prior episodes of fatigue and loss of consciousness.  No trauma is reported.  Patient and daughter both contributed to this history Review of Systems: As mentioned in the history of present illness. All other systems reviewed and are negative. Past Medical History:  Diagnosis Date   Anemia    Arthritis    CAD (coronary artery disease)    a. s/p 2 CoStar DES to RCA 06/2004. b.  LHC 12/09:  EF 65%, oCFX 40%, oRCA 30%, then 30-40% leading into stented seg that is patent, (10-20% ISR), 40% beyond stent,;  c. EF 81%, poor exercise capacity, hypotensive BP response, + chest pain, normal nuclear images (overall low risk)   CKD (chronic kidney disease)    Colon polyps    a. s/p removal 01/2012 - was told one might be cancerous but was not diagnosed with colon cancer - plan is for f/u colonoscopy in 5 yrs.   COPD (chronic obstructive pulmonary disease) (HCC)    Depression    GERD (gastroesophageal reflux disease)    Hx of echocardiogram    a. Echo 06/2011: EF 55-60%, Gr 1 diast dysfn, mild MR   Insomnia    Mini stroke    History of, in 2005 (before she was diagnosed with CAD).   Osteoporosis    Other and unspecified hyperlipidemia    Other isolated or specific phobias    Peripheral vascular  disease, unspecified (HCC)    a. s/p L common femoral & left illiac stenting previously (patent by angio in 2007). - ABI 06/2011 normal. b. Carotid US 0-39% BICA 06/2011 (for f/u 2 yrs);   c.  ABIs 9/13:  normal (1.1 bilaterally)   Personal history of peptic ulcer disease    Remotely.   Psychiatric pseudoseizure    per patient and family   TIA (transient ischemic attack)    Tobacco use disorder    Type II or unspecified type diabetes mellitus without mention of  complication, not stated as uncontrolled    Unspecified essential hypertension    Past Surgical History:  Procedure Laterality Date   APPENDECTOMY     BREAST BIOPSY     BRONCHIAL BIOPSY  04/19/2023   Procedure: BRONCHIAL BIOPSIES;  Surgeon: Leslye Peer, MD;  Location: [facility] ENDOSCOPY;  Service: Pulmonary;;   BRONCHIAL BRUSHINGS  04/19/2023   Procedure: BRONCHIAL BRUSHINGS;  Surgeon: Leslye Peer, MD;  Location: [facility] ENDOSCOPY;  Service: Pulmonary;;   BRONCHIAL NEEDLE ASPIRATION BIOPSY  04/19/2023   Procedure: BRONCHIAL NEEDLE ASPIRATION BIOPSIES;  Surgeon: Leslye Peer, MD;  Location: [facility] ENDOSCOPY;  Service: Pulmonary;;   BRONCHIAL WASHINGS  04/19/2023   Procedure: BRONCHIAL WASHINGS;  Surgeon: Leslye Peer, MD;  Location: [facility] ENDOSCOPY;  Service: Pulmonary;;   CARDIAC CATHETERIZATION  2009   patent RCA stent. mild CAD otherwise.    CATARACT EXTRACTION, BILATERAL  2016   CHOLECYSTECTOMY  2009   FIDUCIAL MARKER PLACEMENT  04/19/2023   Procedure: FIDUCIAL MARKER PLACEMENT;  Surgeon: Leslye Peer, MD;  Location: [facility] ENDOSCOPY;  Service: Pulmonary;;   FLEXIBLE SIGMOIDOSCOPY N/A 10/21/2022   Procedure: Arnell Sieving;  Surgeon: Charna Elizabeth, MD;  Location: [facility] ENDOSCOPY;  Service: Gastroenterology;  Laterality: N/A;   hysterectomy (other)     age 63   KNEE ARTHROSCOPY Left    left arm surgery     LEFT HEART CATHETERIZATION WITH CORONARY ANGIOGRAM N/A 01/16/2014   Procedure: LEFT HEART CATHETERIZATION WITH CORONARY ANGIOGRAM;  Surgeon: Micheline Chapman, MD;  Location: [facility] CATH LAB;  Service: Cardiovascular;  Laterality: N/A;   left leg angiography     stenting of the left common iliac artery   left rotator cuff repair     right coronary artery stent     Social History:  reports that she has been smoking cigarettes. She has a 66 pack-year smoking history. She has never used smokeless tobacco. She reports that she does not drink alcohol and does not use drugs.  Allergies  Allergen  Reactions   Amoxicillin Hives and Nausea And Vomiting   Claritin [Loratadine] Other (See Comments)    Pt's daughter notified us of this allergy.   Insulins Nausea And Vomiting and Other (See Comments)    Patient unsure of name of insulin, will call us back   Other Other (See Comments)   Vioxx [Rofecoxib] Hives   Celecoxib Hives and Nausea And Vomiting   Liraglutide Nausea And Vomiting   Propranolol Diarrhea and Nausea And Vomiting    Family History  Problem Relation Age of Onset   Heart attack Mother    Allergies Mother    Heart attack Father    Allergies Father    Kidney disease Father    Hypertension Father    Melanoma Sister    Coronary artery disease Brother    Breast cancer Maternal Aunt    Breast cancer Maternal Aunt    Breast cancer Paternal Aunt  d. under 50   Pancreatic cancer Maternal Grandmother    Breast cancer Daughter        BRCA1+    Prior to Admission medications   Medication Sig Start Date End Date Taking? Authorizing Provider  acetaminophen (TYLENOL) 500 MG tablet Take 1,000 mg by mouth as needed for moderate pain.    [provider]  amLODipine (NORVASC) 2.5 MG tablet Take 2.5 mg by mouth 2 (two) times daily.    [provider]  aspirin EC 81 MG tablet Take 1 tablet (81 mg total) by mouth daily. 10/29/22   Rodolph Bong, MD  busPIRone (BUSPAR) 7.5 MG tablet Take 7.5 mg by mouth as needed (anxiety).    [provider]  busPIRone (BUSPAR) 7.5 MG tablet Take 1 tablet by mouth daily.    [provider]  calcium carbonate (OS-CAL) 600 MG tablet Take 600 mg by mouth daily. 04/14/23   Dellia Beckwith, MD  clopidogrel (PLAVIX) 75 MG tablet Take 1 tablet (75 mg total) by mouth daily. 10/29/22   Rodolph Bong, MD  divalproex (DEPAKOTE) 250 MG DR tablet Take 250 mg by mouth 2 (two) times daily. 11/19/22   [provider]  donepezil (ARICEPT) 5 MG tablet Take 5 mg by mouth daily. 11/19/22   [provider]  DULoxetine (CYMBALTA) 60 MG capsule Take 60 mg by mouth at bedtime.    [provider]  ezetimibe (ZETIA) 10 MG tablet Take 10 mg by mouth daily.    [provider]  famotidine (PEPCID) 20 MG tablet Take 20 mg by mouth 2 (two) times daily. 04/22/22   [provider]  fexofenadine (ALLEGRA) 180 MG tablet Take 180 mg by mouth as needed for allergies.    [provider]  hydrALAZINE (APRESOLINE) 50 MG tablet Take 50 mg by mouth in the morning and at bedtime.    [provider]  JARDIANCE 25 MG TABS tablet Take 25 mg by mouth daily. 11/23/22   [provider]  KLOR-CON M10 10 MEQ tablet TAKE 1 TABLET BY MOUTH EVERY DAY 05/26/23   Dellia Beckwith, MD  LANTUS SOLOSTAR 100 UNIT/ML Solostar Pen Inject 20 Units into the skin at bedtime. 03/15/23   [provider]  metoprolol tartrate (LOPRESSOR) 25 MG tablet Take 25 mg by mouth 2 (two) times daily.    [provider]  nitroGLYCERIN (NITROSTAT) 0.4 MG SL tablet Place 0.4 mg under the tongue every 5 (five) minutes as needed. For chest pain    [provider]  pantoprazole (PROTONIX) 40 MG tablet Take 40 mg by mouth 2 (two) times daily. 11/30/18   [provider]  PROLIA 60 MG/ML SOSY injection Inject 60 mg into the skin every 6 (six) months. 12/15/22   [provider]  valsartan-hydrochlorothiazide (DIOVAN-HCT) 320-25 MG tablet TAKE 1 TABLET BY ORAL ROUTE ONCE DAILY FOR HIGH BLOOD PRESSURE 12/14/18   [provider]  zonisamide (ZONEGRAN) 50 MG capsule Take 200 mg by mouth in the morning and at bedtime.    [provider]    Physical Exam: Vitals:   07/12/23 1700 07/12/23 1815 07/12/23 1925 07/12/23 2000  BP:  137/64 (!) 153/67 118/69  Pulse:  67 64 75  Resp:  15 (!) 25 (!) 22  Temp: 97.6 F (36.4 C)  98 F (36.7 C)   TempSrc: Oral     SpO2:  97% 100% 100%  Weight:       General: Patient is  hard of hearing appears to be  in no distress.  Able to give a coherent account of her symptoms except for the time when she was on conscious Respiratory exam: Bilateral air entry vesicular Cardiovascular exam S1-S2 normal Abdomen all quadrants are soft nontender Extremities warm without edema Skin without any rash Neurologically symmetric facies gaze is equal, tongue protrusion normal, no focal motor deficit is appreciated Data Reviewed:  Labs on Admission:  Results for orders placed or performed during the hospital encounter of 07/12/23 (from the past 24 hour(s))  CBG monitoring, ED     Status: Abnormal   Collection Time: 07/12/23  2:35 PM  Result Value Ref Range   Glucose-Capillary 108 (H) 70 - 99 mg/dL  Ethanol     Status: None   Collection Time: 07/12/23  2:39 PM  Result Value Ref Range   Alcohol, Ethyl (B) <10 <10 mg/dL  Protime-INR     Status: None   Collection Time: 07/12/23  2:39 PM  Result Value Ref Range   Prothrombin Time 14.4 11.4 - 15.2 seconds   INR 1.1 0.8 - 1.2  APTT     Status: None   Collection Time: 07/12/23  2:39 PM  Result Value Ref Range   aPTT 25 24 - 36 seconds  CBC     Status: Abnormal   Collection Time: 07/12/23  2:39 PM  Result Value Ref Range   WBC 7.9 4.0 - 10.5 K/uL   RBC 3.98 3.87 - 5.11 MIL/uL   Hemoglobin 9.8 (L) 12.0 - 15.0 g/dL   HCT 16.1 (L) 09.6 - 04.5 %   MCV 84.4 80.0 - 100.0 fL   MCH 24.6 (L) 26.0 - 34.0 pg   MCHC 29.2 (L) 30.0 - 36.0 g/dL   RDW 40.9 (H) 81.1 - 91.4 %   Platelets 307 150 - 400 K/uL   nRBC 0.0 0.0 - 0.2 %  Differential     Status: Abnormal   Collection Time: 07/12/23  2:39 PM  Result Value Ref Range   Neutrophils Relative % 58 %   Neutro Abs 4.5 1.7 - 7.7 K/uL   Lymphocytes Relative 35 %   Lymphs Abs 2.8 0.7 - 4.0 K/uL   Monocytes Relative 5 %   Monocytes Absolute 0.4 0.1 - 1.0 K/uL   Eosinophils Relative 1 %   Eosinophils Absolute 0.1 0.0 - 0.5 K/uL   Basophils Relative 0 %   Basophils Absolute 0.0 0.0 - 0.1 K/uL   Immature Granulocytes  1 %   Abs Immature Granulocytes 0.09 (H) 0.00 - 0.07 K/uL  Comprehensive metabolic panel     Status: Abnormal   Collection Time: 07/12/23  2:39 PM  Result Value Ref Range   Sodium 139 135 - 145 mmol/L   Potassium 4.1 3.5 - 5.1 mmol/L   Chloride 109 98 - 111 mmol/L   CO2 23 22 - 32 mmol/L   Glucose, Bld 125 (H) 70 - 99 mg/dL   BUN 40 (H) 8 - 23 mg/dL   Creatinine, Ser 7.82 (H) 0.44 - 1.00 mg/dL   Calcium 9.0 8.9 - 95.6 mg/dL   Total Protein 6.9 6.5 - 8.1 g/dL   Albumin 3.6 3.5 - 5.0 g/dL   AST 23 15 - 41 U/L   ALT 12 0 - 44 U/L   Alkaline Phosphatase 71 38 - 126 U/L   Total Bilirubin 0.4 <1.2 mg/dL   GFR, Estimated 45 (L) >60 mL/min   Anion gap 7 5 - 15  I-stat chem  8, ED     Status: Abnormal   Collection Time: 07/12/23  2:50 PM  Result Value Ref Range   Sodium 142 135 - 145 mmol/L   Potassium 4.4 3.5 - 5.1 mmol/L   Chloride 110 98 - 111 mmol/L   BUN 35 (H) 8 - 23 mg/dL   Creatinine, Ser 4.09 (H) 0.44 - 1.00 mg/dL   Glucose, Bld 811 (H) 70 - 99 mg/dL   Calcium, Ion 9.14 1.15 - 1.40 mmol/L   TCO2 20 (L) 22 - 32 mmol/L   Hemoglobin 11.6 (L) 12.0 - 15.0 g/dL   HCT 78.2 (L) 95.6 - 21.3 %  Urine rapid drug screen (hosp performed)not at [facility]     Status: None   Collection Time: 07/12/23  8:05 PM  Result Value Ref Range   Opiates NONE DETECTED NONE DETECTED   Cocaine NONE DETECTED NONE DETECTED   Benzodiazepines NONE DETECTED NONE DETECTED   Amphetamines NONE DETECTED NONE DETECTED   Tetrahydrocannabinol NONE DETECTED NONE DETECTED   Barbiturates NONE DETECTED NONE DETECTED   Basic Metabolic Panel: Recent Labs  Lab 07/12/23 1439 07/12/23 1450  NA 139 142  K 4.1 4.4  CL 109 110  CO2 23  --   GLUCOSE 125* 120*  BUN 40* 35*  CREATININE 1.28* 1.40*  CALCIUM 9.0  --    Liver Function Tests: Recent Labs  Lab 07/12/23 1439  AST 23  ALT 12  ALKPHOS 71  BILITOT 0.4  PROT 6.9  ALBUMIN 3.6   No results for input(s): "LIPASE", "AMYLASE" in the last 168 hours. No  results for input(s): "AMMONIA" in the last 168 hours. CBC: Recent Labs  Lab 07/12/23 1439 07/12/23 1450  WBC 7.9  --   NEUTROABS 4.5  --   HGB 9.8* 11.6*  HCT 33.6* 34.0*  MCV 84.4  --   PLT 307  --    Cardiac Enzymes: No results for input(s): "CKTOTAL", "CKMB", "CKMBINDEX", "TROPONINIHS" in the last 168 hours.  BNP (last 3 results) No results for input(s): "PROBNP" in the last 8760 hours. CBG: Recent Labs  Lab 07/12/23 1435  GLUCAP 108*    Radiological Exams on Admission:  MR BRAIN WO CONTRAST  Result Date: 07/12/2023 CLINICAL DATA:  Nonverbal, right hand numbness, seizure like activity EXAM: MRI HEAD WITHOUT CONTRAST TECHNIQUE: Multiplanar, multiecho pulse sequences of the brain and surrounding structures were obtained without intravenous contrast. COMPARISON:  12/24/2018 MRI head, correlation is also made with 07/12/2023 CT head FINDINGS: Brain: No restricted diffusion to suggest acute or subacute infarct. No acute hemorrhage, mass, mass effect, or midline shift. No hydrocephalus or extra-axial collection. Pituitary and craniocervical junction within normal limits. No hemosiderin deposition to suggest remote hemorrhage. Scattered T2 hyperintense signal in the periventricular white matter and pons, likely the sequela of chronic small vessel ischemic disease. Vascular: Normal arterial flow voids. Skull and upper cervical spine: Normal marrow signal. Sinuses/Orbits: Mucosal thickening and air-fluid level in the right maxillary sinus. Mucosal thickening in the ethmoid air cells and frontal sinuses. No acute finding in the orbits. Status post bilateral lens replacements. Other: Fluid in the right-greater-than-left mastoid air cells. IMPRESSION: 1. No acute intracranial process. No evidence of acute or subacute infarct. 2. Mucosal thickening and air-fluid level in the right maxillary sinus, which can be seen in the setting of acute sinusitis. Electronically Signed   By: Wiliam Ke M.D.    On: 07/12/2023 19:23   CT ANGIO HEAD NECK W WO CM W PERF  Result Date:  07/12/2023 CLINICAL DATA:  Neuro deficit, acute, stroke suspected. Right hand numbness. Altered mental status. History of lung cancer. EXAM: CT ANGIOGRAPHY HEAD AND NECK CT PERFUSION BRAIN TECHNIQUE: Multidetector CT imaging of the head and neck was performed using the standard protocol during bolus administration of intravenous contrast. Multiplanar CT image reconstructions and MIPs were obtained to evaluate the vascular anatomy. Carotid stenosis measurements (when applicable) are obtained utilizing NASCET criteria, using the distal internal carotid diameter as the denominator. Multiphase CT imaging of the brain was performed following IV bolus contrast injection. Subsequent parametric perfusion maps were calculated using RAPID software. RADIATION DOSE REDUCTION: This exam was performed according to the departmental dose-optimization program which includes automated exposure control, adjustment of the mA and/or kV according to patient size and/or use of iterative reconstruction technique. CONTRAST:  OMNIPAQUE IOHEXOL 350 MG/ML SOLN COMPARISON:  CTA head and neck 06/08/2021 FINDINGS: CTA NECK FINDINGS Aortic arch: Standard 3 vessel aortic arch with mild-to-moderate calcified and soft plaque. No significant stenosis of the arch vessel origins. Right carotid system: Patent without evidence of stenosis or dissection. Partially retropharyngeal course of the proximal ICA. Left carotid system: Patent without evidence of a significant common carotid or internal carotid artery stenosis or dissection. Moderate stenosis of the proximal ECA. Vertebral arteries: Patent without evidence a significant stenosis or dissection. Dominant left vertebral artery. Skeleton: Mild cervical spondylosis. Other neck: No evidence of cervical lymphadenopathy or mass. Upper chest: Mild scarring and peribronchial thickening in the upper lobes. Review of the MIP  images confirms the above findings CTA HEAD FINDINGS Anterior circulation: The internal carotid arteries are patent from skull base to carotid termini with mild atherosclerosis resulting in no more than mild right cavernous stenosis, unchanged. ACAs and MCAs are patent without evidence of a proximal branch occlusion or significant proximal stenosis. No aneurysm is identified. Posterior circulation: The intracranial vertebral arteries are widely patent to the basilar. Patent PICA and SCA origins are visualized bilaterally. The basilar artery is widely patent. Posterior communicating arteries are diminutive or absent. Both PCAs are patent without evidence of a significant proximal stenosis. No aneurysm is identified. Venous sinuses: Patent. Anatomic variants: None. Review of the MIP images confirms the above findings CT Brain Perfusion Findings: The study is motion degraded. ASPECTS: 10 CBF (<30%) Volume: 0 mL Perfusion (Tmax>6.0s) volume: 9 mL, located in the anteroinferior right frontal lobe and considered to be artifactual IMPRESSION: 1. No large vessel occlusion or flow limiting proximal intracranial stenosis. 2. Cervical carotid atherosclerosis without significant common carotid or internal carotid artery stenosis. 3. No evidence of core infarct or penumbra on CTP. 4.  Aortic Atherosclerosis (ICD10-I70.0). Electronically Signed   By: Sebastian Ache M.D.   On: 07/12/2023 16:50   CT HEAD CODE STROKE WO CONTRAST  Result Date: 07/12/2023 CLINICAL DATA:  Code stroke.  Right hand numbness EXAM: CT HEAD WITHOUT CONTRAST TECHNIQUE: Contiguous axial images were obtained from the base of the skull through the vertex without intravenous contrast. RADIATION DOSE REDUCTION: This exam was performed according to the departmental dose-optimization program which includes automated exposure control, adjustment of the mA and/or kV according to patient size and/or use of iterative reconstruction technique. COMPARISON:  CT head  05/14/23 FINDINGS: Brain: No hemorrhage. No hydrocephalus. No extra-axial fluid collection. No mass effect. No mass lesion. Possible age indeterminate infarct in the right cerebellum (series 2, image 12). There is a background of mild chronic microvascular ischemic change. Vascular: No hyperdense vessel or unexpected calcification. Skull: Normal. Negative for fracture  or focal lesion. Sinuses/Orbits: No acute finding. Other: None. ASPECTS [facility] Stroke Program Early CT Score): 10 IMPRESSION: No hemorrhage. Possible age indeterminate infarct in the medial right cerebellum. Electronically Signed   By: Lorenza Cambridge M.D.   On: 07/12/2023 14:58    EKG: Independently reviewed. NSR. RSR' pattern in V1 V2 felt to be chronic since 04/19/2023   Assessment and Plan: Loss of consciousness (HCC) occured around noon time, self-limited, again in a pattern of recurrent episodes about 3 times a week.  I will do limited syncope workup with telemetry echo, insulin glucose monitoring.  Stroke-like symptoms Presentation of left arm/hand numbness leg tingling associated with tongue twisting as well as change in speech.  Followed by seizure-like activity in the CAT scan room in the ER.  See HPI for full details.  With prompt return to neurologic baseline afterwards.  In the setting of prior several episodes of similar presentations in the past as per daughter.  MRI at this time not showing any acute changes.  Although initial CAT scan showed concern of left cerebellar infarction.  Patient is s/p code stroke.  EEG is pending.  I will give patient diet after she passes swallow screen.  Patient being admitted to [facility] with plan for neurology to continue follow-up and advise.    Pulmonary nodule 1 cm or greater in diameter Per radonc note on 06/15/2023:  diagnosis of non-small cell lung cancer of the right upper lobe.  The patient was noted to have a 2.5 cm tumor within the right upper lobe on imaging.  PET scan  was performed which showed low-level activity at this location with a maximum SUV of 1.8.  Bronchoscopy was performed and this confirmed non-small cell lung cancer.  The patient had an MRI scan of the brain which was negative for any intracranial disease.  The patient has a history of left-sided breast cancer for which she underwent radiation treatment.  She is not a good surgical candidate so I have been asked to see the patient for consideration of stereotactic body radiation treatment.   Home medication reconciliation is pending pharmacy input.  Patient's medication list from November 5 was discussed with daughter, some medications were ordered.  Patient has previous diagnosis of pseudoseizures, patient was continued on zonisamide as well as valproic acid.  As well as her Lantus 20 units subcutaneously daily.  Review med rec after pharmacy input    Advance Care Planning:   Code Status: Full Code   Consults: neurology engaged per code stroke.  Family Communication: daughter at bedside. All questions answered.  Severity of Illness: The appropriate patient status for this patient is OBSERVATION. Observation status is judged to be reasonable and necessary in order to provide the required intensity of service to ensure the patient's safety. The patient's presenting symptoms, physical exam findings, and initial radiographic and laboratory data in the context of their medical condition is felt to place them at decreased risk for further clinical deterioration. Furthermore, it is anticipated that the patient will be medically stable for discharge from the hospital within 2 midnights of admission.   Author: Nolberto Hanlon, MD 07/12/2023 8:38 PM  For on call review [email or web address].

## 2023-07-12 NOTE — ED Triage Notes (Signed)
Pt arrived via POV with daughter at 25. Pt was pulled out of vehicle. Pt was non-verbal but looking around. Daughter stated pt complained that R hand became numb around 1420. Pt became slight ly more alert in rm. Then had seizure like activity in CT.

## 2023-07-12 NOTE — ED Notes (Signed)
Pt daughter states Lindsay Brooks does not have any metal in her body

## 2023-07-12 NOTE — ED Notes (Signed)
Pt had another pass out episode, notified knapp and nurse.

## 2023-07-12 NOTE — ED Notes (Signed)
Code stroke called by  charge Maralyn Sago.

## 2023-07-12 NOTE — ED Provider Notes (Signed)
[place] [facility] DEPARTMENT AT [facility] Provider Note   CSN: [id number] Arrival date & time: 07/12/23  1431     History  Chief complaint altered mental status.  Lindsay Brooks is a 72 y.o. female.  HPI   Patient has a history of TIA breast cancer diabetes chronic tobacco use lung cancer psychosomatic seizure who presents to the ED with altered mental status.  Daughter states patient was coming here for a radiation oncology appointment.  Patient is currently undergoing radiation treatments for her lung cancer.  On the way over patient became unresponsive.  She slumped over in the car.  Patient would not respond verbally.  ED staff immediately brought the patient to the bedside  Home Medications Prior to Admission medications   Medication Sig Start Date End Date Taking? Authorizing Provider  acetaminophen (TYLENOL) 500 MG tablet Take 1,000 mg by mouth as needed for moderate pain.    [provider]  amLODipine (NORVASC) 2.5 MG tablet Take 2.5 mg by mouth 2 (two) times daily.    [provider]  aspirin EC 81 MG tablet Take 1 tablet (81 mg total) by mouth daily. 10/29/22   Rodolph Bong, MD  busPIRone (BUSPAR) 7.5 MG tablet Take 7.5 mg by mouth as needed (anxiety).    [provider]  busPIRone (BUSPAR) 7.5 MG tablet Take 1 tablet by mouth daily.    [provider]  calcium carbonate (OS-CAL) 600 MG tablet Take 600 mg by mouth daily. 04/14/23   Dellia Beckwith, MD  clopidogrel (PLAVIX) 75 MG tablet Take 1 tablet (75 mg total) by mouth daily. 10/29/22   Rodolph Bong, MD  divalproex (DEPAKOTE) 250 MG DR tablet Take 250 mg by mouth 2 (two) times daily. 11/19/22   [provider]  donepezil (ARICEPT) 5 MG tablet Take 5 mg by mouth daily. 11/19/22   [provider]  DULoxetine (CYMBALTA) 60 MG capsule Take 60 mg by mouth at bedtime.    [provider]  ezetimibe (ZETIA) 10 MG tablet Take 10 mg by mouth  daily.    [provider]  famotidine (PEPCID) 20 MG tablet Take 20 mg by mouth 2 (two) times daily. 04/22/22   [provider]  fexofenadine (ALLEGRA) 180 MG tablet Take 180 mg by mouth as needed for allergies.    [provider]  hydrALAZINE (APRESOLINE) 50 MG tablet Take 50 mg by mouth in the morning and at bedtime.    [provider]  JARDIANCE 25 MG TABS tablet Take 25 mg by mouth daily. 11/23/22   [provider]  KLOR-CON M10 10 MEQ tablet TAKE 1 TABLET BY MOUTH EVERY DAY 05/26/23   Dellia Beckwith, MD  LANTUS SOLOSTAR 100 UNIT/ML Solostar Pen Inject 20 Units into the skin at bedtime. 03/15/23   [provider]  metoprolol tartrate (LOPRESSOR) 25 MG tablet Take 25 mg by mouth 2 (two) times daily.    [provider]  nitroGLYCERIN (NITROSTAT) 0.4 MG SL tablet Place 0.4 mg under the tongue every 5 (five) minutes as needed. For chest pain    [provider]  pantoprazole (PROTONIX) 40 MG tablet Take 40 mg by mouth 2 (two) times daily. 11/30/18   [provider]  PROLIA 60 MG/ML SOSY injection Inject 60 mg into the skin every 6 (six) months. 12/15/22   [provider]  valsartan-hydrochlorothiazide (DIOVAN-HCT) 320-25 MG tablet TAKE 1 TABLET BY ORAL ROUTE ONCE DAILY FOR HIGH BLOOD PRESSURE  12/14/18   [provider]  zonisamide (ZONEGRAN) 50 MG capsule Take 200 mg by mouth in the morning and at bedtime.    [provider]      Allergies    Amoxicillin, Claritin [loratadine], Insulins, Other, Vioxx [rofecoxib], Celecoxib, Liraglutide, and Propranolol    Review of Systems   Review of Systems  Physical Exam Updated Vital Signs There were no vitals taken for this visit. Physical Exam Vitals and nursing note reviewed.  Constitutional:      Appearance: She is well-developed. She is ill-appearing.  HENT:     Head: Normocephalic and atraumatic.     Right Ear: External ear normal.     Left  Ear: External ear normal.  Eyes:     General: No scleral icterus.       Right eye: No discharge.        Left eye: No discharge.     Conjunctiva/sclera: Conjunctivae normal.  Neck:     Trachea: No tracheal deviation.  Cardiovascular:     Rate and Rhythm: Normal rate and regular rhythm.  Pulmonary:     Effort: Pulmonary effort is normal. No respiratory distress.     Breath sounds: Normal breath sounds. No stridor. No wheezing or rales.  Abdominal:     General: Bowel sounds are normal. There is no distension.     Palpations: Abdomen is soft.     Tenderness: There is no abdominal tenderness. There is no guarding or rebound.  Musculoskeletal:        General: No tenderness or deformity.     Cervical back: Neck supple.     Comments: Strong femoral pulses  Skin:    General: Skin is warm and dry.     Findings: No rash.  Neurological:     General: No focal deficit present.     GCS: GCS eye subscore is 4. GCS verbal subscore is 1. GCS motor subscore is 5.     Cranial Nerves: No facial asymmetry.     Sensory: No sensory deficit.     Motor: Abnormal muscle tone present.     Comments: Patient nonverbal, looks at me when I am speaking to her, does not respond verbally, not following any commands  Psychiatric:        Mood and Affect: Mood normal.     ED Results / Procedures / Treatments   Labs (all labs ordered are listed, but only abnormal results are displayed) Labs Reviewed  ETHANOL  PROTIME-INR  APTT  CBC  DIFFERENTIAL  COMPREHENSIVE METABOLIC PANEL  ETHANOL  RAPID URINE DRUG SCREEN, HOSP PERFORMED  URINALYSIS, ROUTINE W REFLEX MICROSCOPIC  I-STAT CHEM 8, ED    EKG None  Radiology No results found.  Procedures .Critical Care  Performed by: Linwood Dibbles, MD Authorized by: Linwood Dibbles, MD   Critical care provider statement:    Critical care time (minutes):  30   Critical care was time spent personally by me on the following activities:  Development of treatment plan  with patient or surrogate, discussions with consultants, evaluation of patient's response to treatment, examination of patient, ordering and review of laboratory studies, ordering and review of radiographic studies, ordering and performing treatments and interventions, pulse oximetry, re-evaluation of patient's condition and review of old charts     Medications Ordered in ED Medications  sodium chloride 0.9 % bolus 500 mL (has no administration in time range)    Followed by  0.9 %  sodium chloride infusion (has no administration  in time range)    ED Course/ Medical Decision Making/ A&P Clinical Course as of 07/12/23 1544  Mon Jul 12, 2023  1447 Called to bedside, possible seizure activity.  On my arrival to the CT scanner patient is lying still.  When I call her name she does track and look at me.  Nursing staff indicated patient was shaking both shoulders and arms from side-to-side lasting maybe about 30 seconds [JK]  1459 Patient now awake and answering questions.  Having difficulty lifting right arm and right leg off the bed [JK]  1502 Head CT with possible ager indeterminate infarct medial right cerebellum [JK]  1541 Sister states patient is not having another episode.  Not answering questions but responding to sternal rub no seizure activity witnessed [JK]    Clinical Course User Index [JK] Linwood Dibbles, MD                                 Medical Decision Making Amount and/or Complexity of Data Reviewed Labs: ordered. Radiology: ordered.  Risk Prescription drug management.   Patient presented to the ED for evaluation of altered mental status.  Initially concerned about a code stroke.  Code stroke activated on arrival.  During her CT scan patient had possible seizure-like activity noted by nursing staff.  Somewhat atypical.  When I arrived in the room patient was not verbally answering me but was looking at me.  Prior records reviewed patient does have a history of psychosomatic  seizures.  Acute seizures or nonepileptic seizures are also in the differential.  BUN and creatinine elevated compared to previous values otherwise no significant laboratory abnormalities.  Neurology consultation is pending.  Patient likely require admission to hospital for further evaluation and possible EEGs.  Care turned over to Dr. Joselyn Glassman at shift change        Final Clinical Impression(s) / ED Diagnoses Final diagnoses:  None    Rx / DC Orders ED Discharge Orders     None         Linwood Dibbles, MD 07/12/23 1544

## 2023-07-12 NOTE — ED Notes (Signed)
Pt off the floor to MRI.

## 2023-07-12 NOTE — ED Provider Notes (Signed)
4:02 PM Care assumed from Dr. Lynelle Doctor.  At time of transfer care, patient is awaiting on telemetry neurorecommendations with plans for likely admission.  4:57 PM Just spoke to Dr. Caprice Renshaw with teleneurology who recommended MRI without contrast and admission for EEG and monitoring in the setting of the use unresponsive episodes and the abnormal finding on CT scan showing an age-indeterminate opacity that could be a stroke.  Will order the MRI, EEG, and will call medicine for admission to [facility].  Neurology agreed with admission to [facility] for medical admission and further workup.  Clinical Impression: 1. Altered mental status, unspecified altered mental status type     Disposition: Admit  This note was prepared with assistance of Dragon voice recognition software. Occasional wrong-word or sound-a-like substitutions may have occurred due to the inherent limitations of voice recognition software.     Haruto Demaria, Canary Brim, MD 07/12/23 [phone]

## 2023-07-12 NOTE — ED Notes (Signed)
Swallow screen preformed, pt unable to finish swallow screen d/t AMS, pt could not demonstrate water challenge portion of screening. RN provided mouth care and moist mouth swabs at this time for comfort, pt's daughter at bedside.

## 2023-07-13 ENCOUNTER — Observation Stay (HOSPITAL_BASED_OUTPATIENT_CLINIC_OR_DEPARTMENT_OTHER): Payer: Medicare Other

## 2023-07-13 ENCOUNTER — Ambulatory Visit: Payer: Medicare Other | Admitting: Radiation Oncology

## 2023-07-13 ENCOUNTER — Observation Stay (HOSPITAL_COMMUNITY): Payer: Medicare Other

## 2023-07-13 ENCOUNTER — Observation Stay (HOSPITAL_COMMUNITY)
Admit: 2023-07-13 | Discharge: 2023-07-13 | Disposition: A | Discharge status: Home / Self Care | Payer: Medicare Other | Attending: Emergency Medicine | Admitting: Emergency Medicine

## 2023-07-13 DIAGNOSIS — R4182 Altered mental status, unspecified: Secondary | ICD-10-CM | POA: Diagnosis not present

## 2023-07-13 DIAGNOSIS — R911 Solitary pulmonary nodule: Secondary | ICD-10-CM | POA: Diagnosis not present

## 2023-07-13 DIAGNOSIS — R2990 Unspecified symptoms and signs involving the nervous system: Secondary | ICD-10-CM

## 2023-07-13 DIAGNOSIS — R55 Syncope and collapse: Secondary | ICD-10-CM

## 2023-07-13 DIAGNOSIS — I700 Atherosclerosis of aorta: Secondary | ICD-10-CM | POA: Diagnosis not present

## 2023-07-13 DIAGNOSIS — R569 Unspecified convulsions: Secondary | ICD-10-CM | POA: Diagnosis not present

## 2023-07-13 DIAGNOSIS — R7989 Other specified abnormal findings of blood chemistry: Secondary | ICD-10-CM | POA: Diagnosis not present

## 2023-07-13 DIAGNOSIS — J984 Other disorders of lung: Secondary | ICD-10-CM | POA: Diagnosis not present

## 2023-07-13 LAB — ECHOCARDIOGRAM COMPLETE
AR max vel: 3.24 cm2
AV Area VTI: 3.41 cm2
AV Area mean vel: 3.3 cm2
AV Mean grad: 3 mm[Hg]
AV Peak grad: 6.5 mm[Hg]
Ao pk vel: 1.27 m/s
Area-P 1/2: 2.17 cm2
Calc EF: 71.2 %
MV VTI: 4.75 cm2
S' Lateral: 2.9 cm
Single Plane A2C EF: 63.9 %
Single Plane A4C EF: 77 %
Weight: 1888 [oz_av]

## 2023-07-13 LAB — CBC
HCT: 27.2 % — ABNORMAL LOW (ref 36.0–46.0)
Hemoglobin: 8 g/dL — ABNORMAL LOW (ref 12.0–15.0)
MCH: 24.9 pg — ABNORMAL LOW (ref 26.0–34.0)
MCHC: 29.4 g/dL — ABNORMAL LOW (ref 30.0–36.0)
MCV: 84.7 fL (ref 80.0–100.0)
Platelets: 205 10*3/uL (ref 150–400)
RBC: 3.21 MIL/uL — ABNORMAL LOW (ref 3.87–5.11)
RDW: 18.6 % — ABNORMAL HIGH (ref 11.5–15.5)
WBC: 4.5 10*3/uL (ref 4.0–10.5)
nRBC: 0 % (ref 0.0–0.2)

## 2023-07-13 LAB — CBG MONITORING, ED
Glucose-Capillary: 75 mg/dL (ref 70–99)
Glucose-Capillary: 87 mg/dL (ref 70–99)
Glucose-Capillary: 75 mg/dL (ref 70–99)
Glucose-Capillary: 95 mg/dL (ref 70–99)

## 2023-07-13 LAB — BASIC METABOLIC PANEL
Anion gap: 9 (ref 5–15)
BUN: 30 mg/dL — ABNORMAL HIGH (ref 8–23)
CO2: 20 mmol/L — ABNORMAL LOW (ref 22–32)
Calcium: 8 mg/dL — ABNORMAL LOW (ref 8.9–10.3)
Chloride: 113 mmol/L — ABNORMAL HIGH (ref 98–111)
Creatinine, Ser: 0.97 mg/dL (ref 0.44–1.00)
GFR, Estimated: >60 mL/min (ref >60)
Glucose, Bld: 79 mg/dL (ref 70–99)
Potassium: 3.3 mmol/L — ABNORMAL LOW (ref 3.5–5.1)
Sodium: 142 mmol/L (ref 135–145)

## 2023-07-13 LAB — APTT: aPTT: 26 s (ref 24–36)

## 2023-07-13 LAB — PROTIME-INR
INR: 1.2 (ref 0.8–1.2)
Prothrombin Time: 14.9 s (ref 11.4–15.2)

## 2023-07-13 LAB — HEMOGLOBIN A1C
Hgb A1c MFr Bld: 5.9 % — ABNORMAL HIGH (ref 4.8–5.6)
Mean Plasma Glucose: 122.63 mg/dL

## 2023-07-13 LAB — GLUCOSE, CAPILLARY: Glucose-Capillary: 109 mg/dL — ABNORMAL HIGH (ref 70–99)

## 2023-07-13 LAB — D-DIMER, QUANTITATIVE: D-Dimer, Quant: 2.78 ug{FEU}/mL — ABNORMAL HIGH (ref 0.00–0.50)

## 2023-07-13 MED ORDER — SODIUM CHLORIDE 0.9 % IV SOLN: INTRAVENOUS | Status: DC

## 2023-07-13 MED ORDER — EZETIMIBE 10 MG PO TABS
10.0000 mg | ORAL_TABLET | Freq: Every day | ORAL | Status: DC
  Administered 2023-07-13 – 2023-07-15 (×3): 10 mg via ORAL
  Filled 2023-07-13 (×3): qty 1

## 2023-07-13 MED ORDER — ORAL CARE MOUTH RINSE: 15.0000 mL | OROMUCOSAL | Status: DC | PRN

## 2023-07-13 MED ORDER — DULOXETINE HCL 60 MG PO CPEP
60.0000 mg | ORAL_CAPSULE | Freq: Every day | ORAL | Status: DC
  Administered 2023-07-13 – 2023-07-14 (×2): 60 mg via ORAL
  Filled 2023-07-13 (×2): qty 1

## 2023-07-13 MED ORDER — PNEUMOCOCCAL 20-VAL CONJ VACC 0.5 ML IM SUSY
0.5000 mL | PREFILLED_SYRINGE | INTRAMUSCULAR | Status: AC
  Administered 2023-07-14: 0.5 mL via INTRAMUSCULAR
  Filled 2023-07-13: qty 0.5

## 2023-07-13 MED ORDER — IOHEXOL 350 MG/ML SOLN
75.0000 mL | Freq: Once | INTRAVENOUS | Status: AC | PRN
Start: 2023-07-13 — End: 2023-07-13
  Administered 2023-07-13: 75 mL via INTRAVENOUS

## 2023-07-13 MED ORDER — INFLUENZA VAC A&B SURF ANT ADJ 0.5 ML IM SUSY
0.5000 mL | PREFILLED_SYRINGE | INTRAMUSCULAR | Status: AC
  Administered 2023-07-14: 0.5 mL via INTRAMUSCULAR
  Filled 2023-07-13: qty 0.5

## 2023-07-13 MED ORDER — PANTOPRAZOLE SODIUM 40 MG PO TBEC
40.0000 mg | DELAYED_RELEASE_TABLET | Freq: Every day | ORAL | Status: DC
  Administered 2023-07-14 – 2023-07-15 (×2): 40 mg via ORAL
  Filled 2023-07-13 (×2): qty 1

## 2023-07-13 MED ORDER — METOPROLOL TARTRATE 25 MG PO TABS
25.0000 mg | ORAL_TABLET | Freq: Two times a day (BID) | ORAL | Status: DC
  Administered 2023-07-13 – 2023-07-15 (×5): 25 mg via ORAL
  Filled 2023-07-13 (×5): qty 1

## 2023-07-13 MED ORDER — POTASSIUM CHLORIDE CRYS ER 20 MEQ PO TBCR
40.0000 meq | EXTENDED_RELEASE_TABLET | Freq: Once | ORAL | Status: AC
  Administered 2023-07-13: 40 meq via ORAL
  Filled 2023-07-13: qty 2

## 2023-07-13 NOTE — ED Notes (Signed)
ED TO INPATIENT HANDOFF REPORT  ED Nurse Name and Phone #: Tiron Suski  S Name/Age/Gender Lindsay Brooks 72 y.o. female Room/Bed: RESA/RESA  Code Status   Code Status: Full Code  Home/SNF/Other Home Patient oriented to: self, place, and time Is this baseline? No   Triage Complete: Triage complete  Chief Complaint Seizures (HCC) [R56.9] Syncope [R55]  Triage Note Pt arrived via POV with daughter at 1430. Pt was pulled out of vehicle. Pt was non-verbal but looking around. Daughter stated pt complained that R hand became numb around 1420. Pt became slight ly more alert in rm. Then had seizure like activity in CT.       Allergies Allergies  Allergen Reactions   Amoxicillin Hives and Nausea And Vomiting   Claritin [Loratadine] Other (See Comments)    Pt's daughter notified us of this allergy (reaction?)   Insulins Nausea And Vomiting and Other (See Comments)    Patient unsure of name of insulin   Vioxx [Rofecoxib] Hives   Celecoxib Hives and Nausea And Vomiting   Liraglutide Nausea And Vomiting and Other (See Comments)    Victoza   Propranolol Diarrhea and Nausea And Vomiting    Level of Care/Admitting Diagnosis ED Disposition     ED Disposition  Admit   Condition  --   Comment  Hospital Area: [facility] [[id number]]  Level of Care: Telemetry [5]  Admit to tele based on following criteria: Eval of Syncope  May place patient in observation at [facility] or [facility] if equivalent level of care is available:: Yes  Covid Evaluation: Asymptomatic - no recent exposure (last 10 days) testing not required  Diagnosis: Syncope [206001]  Admitting Physician: Kathlen Mody [[id number]]  Attending Physician: Kathlen Mody [[id number]]          B Medical/Surgery History Past Medical History:  Diagnosis Date   Anemia    Arthritis    CAD (coronary artery disease)    a. s/p 2 CoStar DES to RCA 06/2004. b.  LHC 12/09:  EF 65%, oCFX 40%, oRCA 30%, then 30-40%  leading into stented seg that is patent, (10-20% ISR), 40% beyond stent,;  c. EF 81%, poor exercise capacity, hypotensive BP response, + chest pain, normal nuclear images (overall low risk)   CKD (chronic kidney disease)    Colon polyps    a. s/p removal 01/2012 - was told one might be cancerous but was not diagnosed with colon cancer - plan is for f/u colonoscopy in 5 yrs.   COPD (chronic obstructive pulmonary disease) (HCC)    Depression    GERD (gastroesophageal reflux disease)    Hx of echocardiogram    a. Echo 06/2011: EF 55-60%, Gr 1 diast dysfn, mild MR   Insomnia    Mini stroke    History of, in 2005 (before she was diagnosed with CAD).   Osteoporosis    Other and unspecified hyperlipidemia    Other isolated or specific phobias    Peripheral vascular disease, unspecified (HCC)    a. s/p L common femoral & left illiac stenting previously (patent by angio in 2007). - ABI 06/2011 normal. b. Carotid US 0-39% BICA 06/2011 (for f/u 2 yrs);   c.  ABIs 9/13:  normal (1.1 bilaterally)   Personal history of peptic ulcer disease    Remotely.   Psychiatric pseudoseizure    per patient and family   TIA (transient ischemic attack)    Tobacco use disorder    Type II or unspecified  type diabetes mellitus without mention of complication, not stated as uncontrolled    Unspecified essential hypertension    Past Surgical History:  Procedure Laterality Date   APPENDECTOMY     BREAST BIOPSY     BRONCHIAL BIOPSY  04/19/2023   Procedure: BRONCHIAL BIOPSIES;  Surgeon: Leslye Peer, MD;  Location: [facility] ENDOSCOPY;  Service: Pulmonary;;   BRONCHIAL BRUSHINGS  04/19/2023   Procedure: BRONCHIAL BRUSHINGS;  Surgeon: Leslye Peer, MD;  Location: [facility] ENDOSCOPY;  Service: Pulmonary;;   BRONCHIAL NEEDLE ASPIRATION BIOPSY  04/19/2023   Procedure: BRONCHIAL NEEDLE ASPIRATION BIOPSIES;  Surgeon: Leslye Peer, MD;  Location: [facility] ENDOSCOPY;  Service: Pulmonary;;   BRONCHIAL WASHINGS  04/19/2023   Procedure:  BRONCHIAL WASHINGS;  Surgeon: Leslye Peer, MD;  Location: [facility] ENDOSCOPY;  Service: Pulmonary;;   CARDIAC CATHETERIZATION  2009   patent RCA stent. mild CAD otherwise.    CATARACT EXTRACTION, BILATERAL  2016   CHOLECYSTECTOMY  2009   FIDUCIAL MARKER PLACEMENT  04/19/2023   Procedure: FIDUCIAL MARKER PLACEMENT;  Surgeon: Leslye Peer, MD;  Location: [facility] ENDOSCOPY;  Service: Pulmonary;;   FLEXIBLE SIGMOIDOSCOPY N/A 10/21/2022   Procedure: Arnell Sieving;  Surgeon: Charna Elizabeth, MD;  Location: [facility] ENDOSCOPY;  Service: Gastroenterology;  Laterality: N/A;   hysterectomy (other)     age 68   KNEE ARTHROSCOPY Left    left arm surgery     LEFT HEART CATHETERIZATION WITH CORONARY ANGIOGRAM N/A 01/16/2014   Procedure: LEFT HEART CATHETERIZATION WITH CORONARY ANGIOGRAM;  Surgeon: Micheline Chapman, MD;  Location: [facility] CATH LAB;  Service: Cardiovascular;  Laterality: N/A;   left leg angiography     stenting of the left common iliac artery   left rotator cuff repair     right coronary artery stent       A IV Location/Drains/Wounds Patient Lines/Drains/Airways Status     Active Line/Drains/Airways     Name Placement date Placement time Site Days   Implanted Port 05/05/22 Right Chest 05/05/22  1309  Chest  434   Peripheral IV 07/12/23 18 G Anterior;Proximal;Right Forearm 07/12/23  1441  Forearm  1   Pressure Injury 10/21/22 Buttocks Right Stage 2 -  Partial thickness loss of dermis presenting as a shallow open injury with a red, pink wound bed without slough. appears to be friction 10/21/22  0817  -- 265            Intake/Output Last 24 hours No intake or output data in the 24 hours ending 07/13/23 1728  Labs/Imaging Results for orders placed or performed during the hospital encounter of 07/12/23 (from the past 48 hour(s))  CBG monitoring, ED     Status: Abnormal   Collection Time: 07/12/23  2:35 PM  Result Value Ref Range   Glucose-Capillary 108 (H) 70 - 99 mg/dL    Comment:  Glucose reference range applies only to samples taken after fasting for at least 8 hours.  Ethanol     Status: None   Collection Time: 07/12/23  2:39 PM  Result Value Ref Range   Alcohol, Ethyl (B) <10 <10 mg/dL    Comment: (NOTE) Lowest detectable limit for serum alcohol is 10 mg/dL.  For medical purposes only. Performed at [facility], [address], [place], [place] [address]   Protime-INR     Status: None   Collection Time: 07/12/23  2:39 PM  Result Value Ref Range   Prothrombin Time 14.4 11.4 - 15.2 seconds   INR 1.1  0.8 - 1.2    Comment: (NOTE) INR goal varies based on device and disease states. Performed at [facility], [address] [place], [place] [address]   APTT     Status: None   Collection Time: 07/12/23  2:39 PM  Result Value Ref Range   aPTT 25 24 - 36 seconds    Comment: Performed at [facility], [address] [place], [place] [address]  CBC     Status: Abnormal   Collection Time: 07/12/23  2:39 PM  Result Value Ref Range   WBC 7.9 4.0 - 10.5 K/uL   RBC 3.98 3.87 - 5.11 MIL/uL   Hemoglobin 9.8 (L) 12.0 - 15.0 g/dL   HCT 25.9 (L) 56.3 - 87.5 %   MCV 84.4 80.0 - 100.0 fL   MCH 24.6 (L) 26.0 - 34.0 pg   MCHC 29.2 (L) 30.0 - 36.0 g/dL   RDW 64.3 (H) 32.9 - 51.8 %   Platelets 307 150 - 400 K/uL   nRBC 0.0 0.0 - 0.2 %    Comment: Performed at [facility], [address] [place], [place] [address]  Differential     Status: Abnormal   Collection Time: 07/12/23  2:39 PM  Result Value Ref Range   Neutrophils Relative % 58 %   Neutro Abs 4.5 1.7 - 7.7 K/uL   Lymphocytes Relative 35 %   Lymphs Abs 2.8 0.7 - 4.0 K/uL   Monocytes Relative 5 %   Monocytes Absolute 0.4 0.1 - 1.0 K/uL   Eosinophils Relative 1 %   Eosinophils Absolute 0.1 0.0 - 0.5 K/uL   Basophils Relative 0 %   Basophils Absolute 0.0 0.0 - 0.1 K/uL   Immature Granulocytes 1 %   Abs Immature Granulocytes 0.09  (H) 0.00 - 0.07 K/uL    Comment: Performed at [facility], [address] [place], [place] [address]  Comprehensive metabolic panel     Status: Abnormal   Collection Time: 07/12/23  2:39 PM  Result Value Ref Range   Sodium 139 135 - 145 mmol/L   Potassium 4.1 3.5 - 5.1 mmol/L   Chloride 109 98 - 111 mmol/L   CO2 23 22 - 32 mmol/L   Glucose, Bld 125 (H) 70 - 99 mg/dL    Comment: Glucose reference range applies only to samples taken after fasting for at least 8 hours.   BUN 40 (H) 8 - 23 mg/dL   Creatinine, Ser 6.01 (H) 0.44 - 1.00 mg/dL   Calcium 9.0 8.9 - 09.3 mg/dL   Total Protein 6.9 6.5 - 8.1 g/dL   Albumin 3.6 3.5 - 5.0 g/dL   AST 23 15 - 41 U/L   ALT 12 0 - 44 U/L   Alkaline Phosphatase 71 38 - 126 U/L   Total Bilirubin 0.4 <1.2 mg/dL   GFR, Estimated 45 (L) >60 mL/min    Comment: (NOTE) Calculated using the CKD-EPI Creatinine Equation (2021)    Anion gap 7 5 - 15    Comment: Performed at [facility], [address], [place], [place] [address]  I-stat chem 8, ED     Status: Abnormal   Collection Time: 07/12/23  2:50 PM  Result Value Ref Range   Sodium 142 135 - 145 mmol/L   Potassium 4.4 3.5 - 5.1 mmol/L   Chloride 110 98 - 111 mmol/L   BUN 35 (H) 8 - 23 mg/dL   Creatinine, Ser 3.22 (  H) 0.44 - 1.00 mg/dL   Glucose, Bld 161 (H) 70 - 99 mg/dL    Comment: Glucose reference range applies only to samples taken after fasting for at least 8 hours.   Calcium, Ion 1.20 1.15 - 1.40 mmol/L   TCO2 20 (L) 22 - 32 mmol/L   Hemoglobin 11.6 (L) 12.0 - 15.0 g/dL   HCT 09.6 (L) 04.5 - 40.9 %  Urine rapid drug screen (hosp performed)not at [facility]     Status: None   Collection Time: 07/12/23  8:05 PM  Result Value Ref Range   Opiates NONE DETECTED NONE DETECTED   Cocaine NONE DETECTED NONE DETECTED   Benzodiazepines NONE DETECTED NONE DETECTED   Amphetamines NONE DETECTED NONE DETECTED   Tetrahydrocannabinol NONE DETECTED NONE DETECTED    Barbiturates NONE DETECTED NONE DETECTED    Comment: (NOTE) DRUG SCREEN FOR MEDICAL PURPOSES ONLY.  IF CONFIRMATION IS NEEDED FOR ANY PURPOSE, NOTIFY LAB WITHIN 5 DAYS.  LOWEST DETECTABLE LIMITS FOR URINE DRUG SCREEN Drug Class                     Cutoff (ng/mL) Amphetamine and metabolites    1000 Barbiturate and metabolites    200 Benzodiazepine                 200 Opiates and metabolites        300 Cocaine and metabolites        300 THC                            50 Performed at [facility], [address] [place], [place] [address]   Urinalysis, Routine w reflex microscopic -Urine, Clean Catch     Status: Abnormal   Collection Time: 07/12/23  8:05 PM  Result Value Ref Range   Color, Urine YELLOW YELLOW   APPearance HAZY (A) CLEAR   Specific Gravity, Urine >1.046 (H) 1.005 - 1.030   pH 5.0 5.0 - 8.0   Glucose, UA 50 (A) NEGATIVE mg/dL   Hgb urine dipstick NEGATIVE NEGATIVE   Bilirubin Urine NEGATIVE NEGATIVE   Ketones, ur NEGATIVE NEGATIVE mg/dL   Protein, ur NEGATIVE NEGATIVE mg/dL   Nitrite NEGATIVE NEGATIVE   Leukocytes,Ua NEGATIVE NEGATIVE   RBC / HPF 0-5 0 - 5 RBC/hpf   WBC, UA 0-5 0 - 5 WBC/hpf   Bacteria, UA RARE (A) NONE SEEN   Squamous Epithelial / HPF 0-5 0 - 5 /HPF   Mucus PRESENT     Comment: Performed at [facility], [address] [place], [place] [address]  CBC     Status: Abnormal   Collection Time: 07/12/23  8:48 PM  Result Value Ref Range   WBC 4.9 4.0 - 10.5 K/uL   RBC 3.45 (L) 3.87 - 5.11 MIL/uL   Hemoglobin 8.5 (L) 12.0 - 15.0 g/dL   HCT 56.2 (L) 13.0 - 86.5 %   MCV 83.5 80.0 - 100.0 fL   MCH 24.6 (L) 26.0 - 34.0 pg   MCHC 29.5 (L) 30.0 - 36.0 g/dL   RDW 78.4 (H) 69.6 - 29.5 %   Platelets 232 150 - 400 K/uL   nRBC 0.0 0.0 - 0.2 %    Comment: Performed at [facility], [address], [place], [place] [address]  Creatinine, serum     Status: Abnormal   Collection Time: 07/12/23   8:48 PM  Result  Value Ref Range   Creatinine, Ser 1.10 (H) 0.44 - 1.00 mg/dL   GFR, Estimated 53 (L) >60 mL/min    Comment: (NOTE) Calculated using the CKD-EPI Creatinine Equation (2021) Performed at [facility], [address], [place], [place] [address]   Hemoglobin A1c     Status: Abnormal   Collection Time: 07/12/23  8:48 PM  Result Value Ref Range   Hgb A1c MFr Bld 5.9 (H) 4.8 - 5.6 %    Comment: (NOTE) Pre diabetes:          5.7%-6.4%  Diabetes:              >6.4%  Glycemic control for   <7.0% adults with diabetes    Mean Plasma Glucose 122.63 mg/dL    Comment: Performed at [facility], [address], [place], [place] [address]  CBG monitoring, ED     Status: None   Collection Time: 07/12/23 10:16 PM  Result Value Ref Range   Glucose-Capillary 84 70 - 99 mg/dL    Comment: Glucose reference range applies only to samples taken after fasting for at least 8 hours.  APTT     Status: None   Collection Time: 07/13/23  5:45 AM  Result Value Ref Range   aPTT 26 24 - 36 seconds    Comment: Performed at [facility], [address] [place], [place] [address]  Protime-INR     Status: None   Collection Time: 07/13/23  5:45 AM  Result Value Ref Range   Prothrombin Time 14.9 11.4 - 15.2 seconds   INR 1.2 0.8 - 1.2    Comment: (NOTE) INR goal varies based on device and disease states. Performed at [facility], [address] [place], [place] [address]   Basic metabolic panel     Status: Abnormal   Collection Time: 07/13/23  5:45 AM  Result Value Ref Range   Sodium 142 135 - 145 mmol/L   Potassium 3.3 (L) 3.5 - 5.1 mmol/L   Chloride 113 (H) 98 - 111 mmol/L   CO2 20 (L) 22 - 32 mmol/L   Glucose, Bld 79 70 - 99 mg/dL    Comment: Glucose reference range applies only to samples taken after fasting for at least 8 hours.   BUN 30 (H) 8 - 23 mg/dL   Creatinine, Ser 6.29 0.44 - 1.00 mg/dL   Calcium 8.0 (L) 8.9 -  10.3 mg/dL   GFR, Estimated >52 >84 mL/min    Comment: (NOTE) Calculated using the CKD-EPI Creatinine Equation (2021)    Anion gap 9 5 - 15    Comment: Performed at [facility], [address], [place], [place] [address]  CBC     Status: Abnormal   Collection Time: 07/13/23  5:45 AM  Result Value Ref Range   WBC 4.5 4.0 - 10.5 K/uL   RBC 3.21 (L) 3.87 - 5.11 MIL/uL   Hemoglobin 8.0 (L) 12.0 - 15.0 g/dL   HCT 01.0 (L) 27.2 - 53.6 %   MCV 84.7 80.0 - 100.0 fL   MCH 24.9 (L) 26.0 - 34.0 pg   MCHC 29.4 (L) 30.0 - 36.0 g/dL   RDW 64.4 (H) 03.4 - 74.2 %   Platelets 205 150 - 400 K/uL   nRBC 0.0 0.0 - 0.2 %    Comment: Performed at [facility], [address], [place], [place] [address]  D-dimer, quantitative     Status:  Abnormal   Collection Time: 07/13/23  5:45 AM  Result Value Ref Range   D-Dimer, Quant 2.78 (H) 0.00 - 0.50 ug/mL-FEU    Comment: (NOTE) At the manufacturer cut-off value of 0.5 g/mL FEU, this assay has a negative predictive value of 95-100%.This assay is intended for use in conjunction with a clinical pretest probability (PTP) assessment model to exclude pulmonary embolism (PE) and deep venous thrombosis (DVT) in outpatients suspected of PE or DVT. Results should be correlated with clinical presentation. Performed at [facility], [address], [place], [place] [address]   CBG monitoring, ED     Status: None   Collection Time: 07/13/23  6:01 AM  Result Value Ref Range   Glucose-Capillary 75 70 - 99 mg/dL    Comment: Glucose reference range applies only to samples taken after fasting for at least 8 hours.  CBG monitoring, ED     Status: None   Collection Time: 07/13/23  7:59 AM  Result Value Ref Range   Glucose-Capillary 87 70 - 99 mg/dL    Comment: Glucose reference range applies only to samples taken after fasting for at least 8 hours.  CBG monitoring, ED     Status: None   Collection Time:  07/13/23 12:29 PM  Result Value Ref Range   Glucose-Capillary 75 70 - 99 mg/dL    Comment: Glucose reference range applies only to samples taken after fasting for at least 8 hours.  CBG monitoring, ED     Status: None   Collection Time: 07/13/23  5:05 PM  Result Value Ref Range   Glucose-Capillary 95 70 - 99 mg/dL    Comment: Glucose reference range applies only to samples taken after fasting for at least 8 hours.   ECHOCARDIOGRAM COMPLETE  Result Date: 07/13/2023    ECHOCARDIOGRAM REPORT   Patient Name:   Lindsay Brooks Date of Exam: 07/13/2023 Medical Rec #:  [id number]        Height:       61.0 in Accession #:    [id number]       Weight:       118.0 lb Date of Birth:  02-03-1951         BSA:          1.509 m Patient Age:    72 years         BP:           157/65 mmHg Patient Gender: F                HR:           67 bpm. Exam Location:  Inpatient Procedure: 2D Echo, Cardiac Doppler and Color Doppler Indications:    Syncope  History:        Patient has prior history of Echocardiogram examinations, most                 recent 03/18/2022. CAD, COPD, Signs/Symptoms:Dyspnea and Chest                 Pain; Risk Factors:Diabetes, Dyslipidemia and Hypertension.  Sonographer:    Vern Claude Referring Phys: [id number] HERSH GOEL IMPRESSIONS  1. Left ventricular ejection fraction, by estimation, is 70 to 75%. The left ventricle has hyperdynamic function. The left ventricle has no regional wall motion abnormalities. Left ventricular diastolic parameters are consistent with Grade I diastolic dysfunction (impaired relaxation).  2. Right ventricular systolic function is normal. The right ventricular size is normal. There is  normal pulmonary artery systolic pressure.  3. The mitral valve is grossly normal. No evidence of mitral valve regurgitation. No evidence of mitral stenosis.  4. The aortic valve was not well visualized. Aortic valve regurgitation is not visualized. No aortic stenosis is present.  5. The inferior vena  cava is normal in size with greater than 50% respiratory variability, suggesting right atrial pressure of 3 mmHg. Comparison(s): No prior Echocardiogram. FINDINGS  Left Ventricle: Left ventricular ejection fraction, by estimation, is 70 to 75%. The left ventricle has hyperdynamic function. The left ventricle has no regional wall motion abnormalities. The left ventricular internal cavity size was normal in size. There is no left ventricular hypertrophy. Left ventricular diastolic parameters are consistent with Grade I diastolic dysfunction (impaired relaxation). Right Ventricle: The right ventricular size is normal. No increase in right ventricular wall thickness. Right ventricular systolic function is normal. There is normal pulmonary artery systolic pressure. The tricuspid regurgitant velocity is 1.21 m/s, and  with an assumed right atrial pressure of 3 mmHg, the estimated right ventricular systolic pressure is 8.9 mmHg. Left Atrium: Left atrial size was normal in size. Right Atrium: Right atrial size was normal in size. Pericardium: There is no evidence of pericardial effusion. Mitral Valve: The mitral valve is grossly normal. No evidence of mitral valve regurgitation. No evidence of mitral valve stenosis. MV peak gradient, 4.2 mmHg. The mean mitral valve gradient is 2.0 mmHg. Tricuspid Valve: The tricuspid valve is normal in structure. Tricuspid valve regurgitation is not demonstrated. No evidence of tricuspid stenosis. Aortic Valve: The aortic valve was not well visualized. Aortic valve regurgitation is not visualized. No aortic stenosis is present. Aortic valve mean gradient measures 3.0 mmHg. Aortic valve peak gradient measures 6.5 mmHg. Aortic valve area, by VTI measures 3.41 cm. Pulmonic Valve: The pulmonic valve was not well visualized. Pulmonic valve regurgitation is not visualized. No evidence of pulmonic stenosis. Aorta: The aortic root, ascending aorta and aortic arch are all structurally normal, with  no evidence of dilitation or obstruction. Venous: The inferior vena cava is normal in size with greater than 50% respiratory variability, suggesting right atrial pressure of 3 mmHg. IAS/Shunts: The atrial septum is grossly normal.  LEFT VENTRICLE PLAX 2D LVIDd:         4.40 cm      Diastology LVIDs:         2.90 cm      LV e' medial:    6.09 cm/s LV PW:         0.60 cm      LV E/e' medial:  12.0 LV IVS:        0.70 cm      LV e' lateral:   9.57 cm/s LVOT diam:     1.90 cm      LV E/e' lateral: 7.6 LV SV:         92 LV SV Index:   61 LVOT Area:     2.84 cm  LV Volumes (MOD) LV vol d, MOD A2C: 121.0 ml LV vol d, MOD A4C: 109.0 ml LV vol s, MOD A2C: 43.7 ml LV vol s, MOD A4C: 25.1 ml LV SV MOD A2C:     77.3 ml LV SV MOD A4C:     109.0 ml LV SV MOD BP:      82.8 ml RIGHT VENTRICLE             IVC RV Basal diam:  2.80 cm     IVC diam: 1.00  cm RV Mid diam:    1.60 cm RV S prime:     13.70 cm/s TAPSE (M-mode): 3.4 cm LEFT ATRIUM             Index        RIGHT ATRIUM          Index LA diam:        3.20 cm 2.12 cm/m   RA Area:     6.32 cm LA Vol (A2C):   40.9 ml 27.10 ml/m  RA Volume:   8.72 ml  5.78 ml/m LA Vol (A4C):   27.8 ml 18.42 ml/m LA Biplane Vol: 33.7 ml 22.33 ml/m  AORTIC VALVE                    PULMONIC VALVE AV Area (Vmax):    3.24 cm     PV Vmax:       0.78 m/s AV Area (Vmean):   3.30 cm     PV Peak grad:  2.4 mmHg AV Area (VTI):     3.41 cm AV Vmax:           127.00 cm/s AV Vmean:          75.200 cm/s AV VTI:            0.271 m AV Peak Grad:      6.5 mmHg AV Mean Grad:      3.0 mmHg LVOT Vmax:         145.00 cm/s LVOT Vmean:        87.400 cm/s LVOT VTI:          0.326 m LVOT/AV VTI ratio: 1.20  AORTA Ao Root diam: 2.90 cm Ao Asc diam:  2.70 cm MITRAL VALVE               TRICUSPID VALVE MV Area (PHT): 2.17 cm    TR Peak grad:   5.9 mmHg MV Area VTI:   4.75 cm    TR Vmax:        121.00 cm/s MV Peak grad:  4.2 mmHg MV Mean grad:  2.0 mmHg    SHUNTS MV Vmax:       1.03 m/s    Systemic VTI:  0.33 m MV  Vmean:      60.7 cm/s   Systemic Diam: 1.90 cm MV Decel Time: 349 msec MV E velocity: 73.00 cm/s MV A velocity: 78.50 cm/s MV E/A ratio:  0.93 Riley Lam MD Electronically signed by Riley Lam MD Signature Date/Time: 07/13/2023/12:05:38 PM    Final    MR BRAIN WO CONTRAST  Result Date: 07/12/2023 CLINICAL DATA:  Nonverbal, right hand numbness, seizure like activity EXAM: MRI HEAD WITHOUT CONTRAST TECHNIQUE: Multiplanar, multiecho pulse sequences of the brain and surrounding structures were obtained without intravenous contrast. COMPARISON:  12/24/2018 MRI head, correlation is also made with 07/12/2023 CT head FINDINGS: Brain: No restricted diffusion to suggest acute or subacute infarct. No acute hemorrhage, mass, mass effect, or midline shift. No hydrocephalus or extra-axial collection. Pituitary and craniocervical junction within normal limits. No hemosiderin deposition to suggest remote hemorrhage. Scattered T2 hyperintense signal in the periventricular white matter and pons, likely the sequela of chronic small vessel ischemic disease. Vascular: Normal arterial flow voids. Skull and upper cervical spine: Normal marrow signal. Sinuses/Orbits: Mucosal thickening and air-fluid level in the right maxillary sinus. Mucosal thickening in the ethmoid air cells and frontal sinuses. No acute finding in the orbits. Status post bilateral lens  replacements. Other: Fluid in the right-greater-than-left mastoid air cells. IMPRESSION: 1. No acute intracranial process. No evidence of acute or subacute infarct. 2. Mucosal thickening and air-fluid level in the right maxillary sinus, which can be seen in the setting of acute sinusitis. Electronically Signed   By: Wiliam Ke M.D.   On: 07/12/2023 19:23   CT ANGIO HEAD NECK W WO CM W PERF  Result Date: 07/12/2023 CLINICAL DATA:  Neuro deficit, acute, stroke suspected. Right hand numbness. Altered mental status. History of lung cancer. EXAM: CT ANGIOGRAPHY  HEAD AND NECK CT PERFUSION BRAIN TECHNIQUE: Multidetector CT imaging of the head and neck was performed using the standard protocol during bolus administration of intravenous contrast. Multiplanar CT image reconstructions and MIPs were obtained to evaluate the vascular anatomy. Carotid stenosis measurements (when applicable) are obtained utilizing NASCET criteria, using the distal internal carotid diameter as the denominator. Multiphase CT imaging of the brain was performed following IV bolus contrast injection. Subsequent parametric perfusion maps were calculated using RAPID software. RADIATION DOSE REDUCTION: This exam was performed according to the departmental dose-optimization program which includes automated exposure control, adjustment of the mA and/or kV according to patient size and/or use of iterative reconstruction technique. CONTRAST:  OMNIPAQUE IOHEXOL 350 MG/ML SOLN COMPARISON:  CTA head and neck 06/08/2021 FINDINGS: CTA NECK FINDINGS Aortic arch: Standard 3 vessel aortic arch with mild-to-moderate calcified and soft plaque. No significant stenosis of the arch vessel origins. Right carotid system: Patent without evidence of stenosis or dissection. Partially retropharyngeal course of the proximal ICA. Left carotid system: Patent without evidence of a significant common carotid or internal carotid artery stenosis or dissection. Moderate stenosis of the proximal ECA. Vertebral arteries: Patent without evidence a significant stenosis or dissection. Dominant left vertebral artery. Skeleton: Mild cervical spondylosis. Other neck: No evidence of cervical lymphadenopathy or mass. Upper chest: Mild scarring and peribronchial thickening in the upper lobes. Review of the MIP images confirms the above findings CTA HEAD FINDINGS Anterior circulation: The internal carotid arteries are patent from skull base to carotid termini with mild atherosclerosis resulting in no more than mild right cavernous stenosis,  unchanged. ACAs and MCAs are patent without evidence of a proximal branch occlusion or significant proximal stenosis. No aneurysm is identified. Posterior circulation: The intracranial vertebral arteries are widely patent to the basilar. Patent PICA and SCA origins are visualized bilaterally. The basilar artery is widely patent. Posterior communicating arteries are diminutive or absent. Both PCAs are patent without evidence of a significant proximal stenosis. No aneurysm is identified. Venous sinuses: Patent. Anatomic variants: None. Review of the MIP images confirms the above findings CT Brain Perfusion Findings: The study is motion degraded. ASPECTS: 10 CBF (<30%) Volume: 0 mL Perfusion (Tmax>6.0s) volume: 9 mL, located in the anteroinferior right frontal lobe and considered to be artifactual IMPRESSION: 1. No large vessel occlusion or flow limiting proximal intracranial stenosis. 2. Cervical carotid atherosclerosis without significant common carotid or internal carotid artery stenosis. 3. No evidence of core infarct or penumbra on CTP. 4.  Aortic Atherosclerosis (ICD10-I70.0). Electronically Signed   By: Sebastian Ache M.D.   On: 07/12/2023 16:50   CT HEAD CODE STROKE WO CONTRAST  Result Date: 07/12/2023 CLINICAL DATA:  Code stroke.  Right hand numbness EXAM: CT HEAD WITHOUT CONTRAST TECHNIQUE: Contiguous axial images were obtained from the base of the skull through the vertex without intravenous contrast. RADIATION DOSE REDUCTION: This exam was performed according to the departmental dose-optimization program which includes automated exposure control,  adjustment of the mA and/or kV according to patient size and/or use of iterative reconstruction technique. COMPARISON:  CT head 05/14/23 FINDINGS: Brain: No hemorrhage. No hydrocephalus. No extra-axial fluid collection. No mass effect. No mass lesion. Possible age indeterminate infarct in the right cerebellum (series 2, image 12). There is a background of mild  chronic microvascular ischemic change. Vascular: No hyperdense vessel or unexpected calcification. Skull: Normal. Negative for fracture or focal lesion. Sinuses/Orbits: No acute finding. Other: None. ASPECTS [facility] Stroke Program Early CT Score): 10 IMPRESSION: No hemorrhage. Possible age indeterminate infarct in the medial right cerebellum. Electronically Signed   By: Lorenza Cambridge M.D.   On: 07/12/2023 14:58    Pending Labs Unresulted Labs (From admission, onward)     Start     Ordered   07/19/23 0500  Creatinine, serum  (enoxaparin (LOVENOX)    CrCl >/= 30 ml/min)  Weekly,   R     Comments: while on enoxaparin therapy    07/12/23 2017            Vitals/Pain Today's Vitals   07/13/23 1100 07/13/23 1130 07/13/23 1232 07/13/23 1706  BP: (!) 171/63 (!) 149/62    Pulse: 65 63    Resp: 20 (!) 21    Temp:   98.3 F (36.8 C) 97.9 F (36.6 C)  TempSrc:   Oral Oral  SpO2: 97% 93%    Weight:      PainSc:        Isolation Precautions No active isolations  Medications Medications  enoxaparin (LOVENOX) injection 30 mg (30 mg Subcutaneous Given 07/13/23 1053)  acetaminophen (TYLENOL) tablet 650 mg (has no administration in time range)    Or  acetaminophen (TYLENOL) suppository 650 mg (has no administration in time range)  polyethylene glycol (MIRALAX / GLYCOLAX) packet 17 g (has no administration in time range)  sodium chloride flush (NS) 0.9 % injection 3 mL (3 mLs Intravenous Given 07/13/23 1055)  insulin aspart (novoLOG) injection 0-5 Units ( Subcutaneous Not Given 07/12/23 2217)  insulin aspart (novoLOG) injection 0-9 Units ( Subcutaneous Not Given 07/13/23 1706)  aspirin chewable tablet 81 mg (81 mg Oral Given 07/13/23 1056)  busPIRone (BUSPAR) tablet 7.5 mg (7.5 mg Oral Given 07/13/23 1053)  zonisamide (ZONEGRAN) capsule 200 mg (200 mg Oral Not Given 07/12/23 2218)  pantoprazole (PROTONIX) injection 40 mg (40 mg Intravenous Given 07/12/23 2217)  valproate (DEPACON) 125 mg in  dextrose 5 % 50 mL IVPB (0 mg Intravenous Stopped 07/13/23 1414)  insulin glargine-yfgn (SEMGLEE) injection 10 Units (10 Units Subcutaneous Given 07/12/23 2333)  DULoxetine (CYMBALTA) DR capsule 60 mg (has no administration in time range)  ezetimibe (ZETIA) tablet 10 mg (10 mg Oral Given 07/13/23 1056)  metoprolol tartrate (LOPRESSOR) tablet 25 mg (25 mg Oral Given 07/13/23 1055)  sodium chloride 0.9 % bolus 500 mL (0 mLs Intravenous Stopped 07/12/23 1710)  LORazepam (ATIVAN) injection 1 mg (1 mg Intravenous Given 07/12/23 1455)  iohexol (OMNIPAQUE) 350 MG/ML injection 100 mL (100 mLs Intravenous Contrast Given 07/12/23 1600)  potassium chloride SA (KLOR-CON M) CR tablet 40 mEq (40 mEq Oral Given 07/13/23 1054)    Mobility walks with device     Focused Assessments Neuro Assessment Handoff:  Swallow screen pass? Yes  Cardiac Rhythm: Normal sinus rhythm   Last date known well: 07/12/23 Last time known well: 1420 Neuro Assessment: Exceptions to WDL Neuro Checks:      Has TPA been given? No If patient is a Neuro Trauma and patient  is going to OR before floor call report to 4N Charge nurse: [phone] or [phone]   R Recommendations: See Admitting Provider Note  Report given to:   Additional Notes: Aox3 unsure of situation, has pseudo seizure like activity that involves head shaking and eye rolling and nonverbal, ambulatory at home but legs weak here, incontinent.

## 2023-07-13 NOTE — Progress Notes (Signed)
  Echocardiogram 2D Echocardiogram has been performed.  Ocie Doyne RDCS 07/13/2023, 10:46 AM

## 2023-07-13 NOTE — ED Notes (Signed)
CBG 75. °

## 2023-07-13 NOTE — Procedures (Signed)
Patient Name: Lindsay Brooks  MRN: [id number]  Epilepsy Attending: Charlsie Quest  Referring Physician/Provider: Tegeler, Canary Brim, MD  Date: 07/13/2023 Duration: 23.46 mins  Patient history: 72 yo F with left hand numbness, tongue twisting up, and nonverbal state that resolved in 15 minutes.  While in CT patient patient had a shaking episode of bilateral shoulders for approximately 30 seconds and was given lorazepam with resolution of symptoms. EEG to evaluate for seizure   Level of alertness: Awake, asleep  AEDs during EEG study: VPA  Technical aspects: This EEG study was done with scalp electrodes positioned according to the 10-20 International system of electrode placement. Electrical activity was reviewed with band pass filter of 1-70Hz , sensitivity of 7 uV/mm, display speed of 45mm/sec with a 60Hz  notched filter applied as appropriate. EEG data were recorded continuously and digitally stored.  Video monitoring was available and reviewed as appropriate.  Description: The posterior dominant rhythm consists of 9-10 Hz activity of moderate voltage (25-35 uV) seen predominantly in posterior head regions, symmetric and reactive to eye opening and eye closing. Sleep was characterized by vertex waves, sleep spindles (12 to 14 Hz), maximal frontocentral region. Physiologic photic driving was not seen during photic stimulation.  Hyperventilation was not performed.     IMPRESSION: This study is within normal limits. No seizures or epileptiform discharges were seen throughout the recording.  A normal interictal EEG does not exclude the diagnosis of epilepsy.  Lindsay Brooks

## 2023-07-13 NOTE — Progress Notes (Signed)
EEG complete - results pending 

## 2023-07-13 NOTE — Progress Notes (Signed)
[facility]                                                                               Lindsay Brooks, is a 72 y.o. female, DOB - Mar 28, 1951, QIH:[id number] Admit date - 07/12/2023    Outpatient Primary MD for the patient is Lindsay Greenspan, MD  LOS - 0  days    Brief summary    Lindsay Brooks is a 72 y.o. female with medical history significant for chronic respiratory failure for which patient is on 2 L/min of supplementary oxygen, CKD, anemia, CAD s/p stents, COPD, GERD, insomnia, PUD, type 2 DM, hypertension, hyperlipidemia, PVD admitted for syncope and left arm numb/ tingling of the left leg and numbness. Neurology consulted. MRI brain without contrast is negative for stroke.    Assessment & Plan    Assessment and Plan:   Syncope:  Ruled out stroke.  EEG and echo are pending.     Stroke-like symptoms Presentation of left arm/hand numbness leg tingling associated with tongue twisting as well as change in speech.  MRI brain is negative for stroke.  Neurology consulted and recommendations given.   EEG is pending.     Non small cell lung cancer Not a candidate for surgery.  Recommend outpatient follow up with radiation oncology.     Orthostatic hypotension:  IV fluids started.  Recheck vital signs in am.     Type 2 DM;   CBG (last 3)  Recent Labs    07/13/23 0759 07/13/23 1229 07/13/23 1705  GLUCAP 87 75 95   A1c is 5.9%. stopped Semglee.  Continue with SSI.    Hypokalemia Replaced.    Hypertension: Well controlled.    Elevated d dimer Check CT angio of the chest .    Anemia of chronic disease: Hemoglobin of 8 .     Estimated body mass index is 22.3 kg/m as calculated from the following:   Height as of 06/15/23: 5\' 1"  (1.549 m).   Weight as of this encounter: 53.5 kg.  Code Status: full code.  DVT Prophylaxis:  enoxaparin (LOVENOX) injection 30 mg Start: 07/13/23 0800 SCDs Start: 07/12/23 2017   Level of Care:  Level of care: Telemetry Medical Family Communication: Updated patient's daughter at bedside.   Disposition Plan:     Remains inpatient appropriate:  orthostatic, IV fluids.   Procedures:  MRI brain without contrast.  Echocardiogram.   Consultants:   Neurology.   Antimicrobials:   Anti-infectives (From admission, onward)    None        Medications  Scheduled Meds:  aspirin  81 mg Oral Daily   busPIRone  7.5 mg Oral Daily   DULoxetine  60 mg Oral QHS   enoxaparin (LOVENOX) injection  30 mg Subcutaneous Q24H   ezetimibe  10 mg Oral Daily   insulin aspart  0-5 Units Subcutaneous QHS   insulin aspart  0-9 Units Subcutaneous TID WC   insulin glargine-yfgn  10 Units Subcutaneous QHS   metoprolol tartrate  25 mg Oral BID   pantoprazole (PROTONIX) IV  40 mg Intravenous Q24H   sodium chloride flush  3 mL Intravenous Q12H   zonisamide  200 mg Oral QHS   Continuous Infusions:  valproate sodium 125 mg (07/13/23 1314)   PRN Meds:.acetaminophen **OR** acetaminophen, polyethylene glycol    Subjective:   Lindsay Brooks was seen and examined today.  Hard of hearing , hungry  Objective:   Vitals:   07/13/23 1030 07/13/23 1100 07/13/23 1130 07/13/23 1232  BP: (!) 146/71 (!) 171/63 (!) 149/62   Pulse: 68 65 63   Resp: (!) 22 20 (!) 21   Temp:    98.3 F (36.8 C)  TempSrc:    Oral  SpO2: 98% 97% 93%   Weight:        Intake/Output Summary (Last 24 hours) at 07/13/2023 1357 Last data filed at 07/12/2023 1710 Gross per 24 hour  Intake 500 ml  Output --  Net 500 ml   Filed Weights   07/12/23 1657  Weight: 53.5 kg     Exam General exam: Appears calm and comfortable  Respiratory system: Clear to auscultation. Respiratory effort normal. Cardiovascular system: S1 & S2 heard, RRR. No JVD,  Gastrointestinal system: Abdomen is nondistended, soft and nontender.  Central nervous system: Alert and oriented. No focal neurological deficits. Extremities: Symmetric 5 x 5  power. Skin: No rashes,  Psychiatry:  Mood & affect appropriate.     Data Reviewed:  I have personally reviewed following labs and imaging studies   CBC Lab Results  Component Value Date   WBC 4.5 07/13/2023   RBC 3.21 (L) 07/13/2023   HGB 8.0 (L) 07/13/2023   HCT 27.2 (L) 07/13/2023   MCV 84.7 07/13/2023   MCH 24.9 (L) 07/13/2023   PLT 205 07/13/2023   MCHC 29.4 (L) 07/13/2023   RDW 18.6 (H) 07/13/2023   LYMPHSABS 2.8 07/12/2023   MONOABS 0.4 07/12/2023   EOSABS 0.1 07/12/2023   BASOSABS 0.0 07/12/2023     Last metabolic panel Lab Results  Component Value Date   NA 142 07/13/2023   K 3.3 (L) 07/13/2023   CL 113 (H) 07/13/2023   CO2 20 (L) 07/13/2023   BUN 30 (H) 07/13/2023   CREATININE 0.97 07/13/2023   GLUCOSE 79 07/13/2023   GFRNONAA >60 07/13/2023   GFRAA 53 (L) 12/23/2018   CALCIUM 8.0 (L) 07/13/2023   PROT 6.9 07/12/2023   ALBUMIN 3.6 07/12/2023   BILITOT 0.4 07/12/2023   ALKPHOS 71 07/12/2023   AST 23 07/12/2023   ALT 12 07/12/2023   ANIONGAP 9 07/13/2023    CBG (last 3)  Recent Labs    07/13/23 0601 07/13/23 0759 07/13/23 1229  GLUCAP 75 87 75      Coagulation Profile: Recent Labs  Lab 07/12/23 1439 07/13/23 0545  INR 1.1 1.2     Radiology Studies: ECHOCARDIOGRAM COMPLETE  Result Date: 07/13/2023    ECHOCARDIOGRAM REPORT   Patient Name:   Lindsay Brooks Date of Exam: 07/13/2023 Medical Rec #:  [id number]        Height:       61.0 in Accession #:    [id number]       Weight:       118.0 lb Date of Birth:  1951-03-06         BSA:          1.509 m Patient Age:    72 years         BP:           157/65 mmHg Patient Gender: F  HR:           67 bpm. Exam Location:  Inpatient Procedure: 2D Echo, Cardiac Doppler and Color Doppler Indications:    Syncope  History:        Patient has prior history of Echocardiogram examinations, most                 recent 03/18/2022. CAD, COPD, Signs/Symptoms:Dyspnea and Chest                 Pain; Risk  Factors:Diabetes, Dyslipidemia and Hypertension.  Sonographer:    Vern Claude Referring Phys: [id number] HERSH GOEL IMPRESSIONS  1. Left ventricular ejection fraction, by estimation, is 70 to 75%. The left ventricle has hyperdynamic function. The left ventricle has no regional wall motion abnormalities. Left ventricular diastolic parameters are consistent with Grade I diastolic dysfunction (impaired relaxation).  2. Right ventricular systolic function is normal. The right ventricular size is normal. There is normal pulmonary artery systolic pressure.  3. The mitral valve is grossly normal. No evidence of mitral valve regurgitation. No evidence of mitral stenosis.  4. The aortic valve was not well visualized. Aortic valve regurgitation is not visualized. No aortic stenosis is present.  5. The inferior vena cava is normal in size with greater than 50% respiratory variability, suggesting right atrial pressure of 3 mmHg. Comparison(s): No prior Echocardiogram. FINDINGS  Left Ventricle: Left ventricular ejection fraction, by estimation, is 70 to 75%. The left ventricle has hyperdynamic function. The left ventricle has no regional wall motion abnormalities. The left ventricular internal cavity size was normal in size. There is no left ventricular hypertrophy. Left ventricular diastolic parameters are consistent with Grade I diastolic dysfunction (impaired relaxation). Right Ventricle: The right ventricular size is normal. No increase in right ventricular wall thickness. Right ventricular systolic function is normal. There is normal pulmonary artery systolic pressure. The tricuspid regurgitant velocity is 1.21 m/s, and  with an assumed right atrial pressure of 3 mmHg, the estimated right ventricular systolic pressure is 8.9 mmHg. Left Atrium: Left atrial size was normal in size. Right Atrium: Right atrial size was normal in size. Pericardium: There is no evidence of pericardial effusion. Mitral Valve: The mitral valve is  grossly normal. No evidence of mitral valve regurgitation. No evidence of mitral valve stenosis. MV peak gradient, 4.2 mmHg. The mean mitral valve gradient is 2.0 mmHg. Tricuspid Valve: The tricuspid valve is normal in structure. Tricuspid valve regurgitation is not demonstrated. No evidence of tricuspid stenosis. Aortic Valve: The aortic valve was not well visualized. Aortic valve regurgitation is not visualized. No aortic stenosis is present. Aortic valve mean gradient measures 3.0 mmHg. Aortic valve peak gradient measures 6.5 mmHg. Aortic valve area, by VTI measures 3.41 cm. Pulmonic Valve: The pulmonic valve was not well visualized. Pulmonic valve regurgitation is not visualized. No evidence of pulmonic stenosis. Aorta: The aortic root, ascending aorta and aortic arch are all structurally normal, with no evidence of dilitation or obstruction. Venous: The inferior vena cava is normal in size with greater than 50% respiratory variability, suggesting right atrial pressure of 3 mmHg. IAS/Shunts: The atrial septum is grossly normal.  LEFT VENTRICLE PLAX 2D LVIDd:         4.40 cm      Diastology LVIDs:         2.90 cm      LV e' medial:    6.09 cm/s LV PW:         0.60 cm  LV E/e' medial:  12.0 LV IVS:        0.70 cm      LV e' lateral:   9.57 cm/s LVOT diam:     1.90 cm      LV E/e' lateral: 7.6 LV SV:         92 LV SV Index:   61 LVOT Area:     2.84 cm  LV Volumes (MOD) LV vol d, MOD A2C: 121.0 ml LV vol d, MOD A4C: 109.0 ml LV vol s, MOD A2C: 43.7 ml LV vol s, MOD A4C: 25.1 ml LV SV MOD A2C:     77.3 ml LV SV MOD A4C:     109.0 ml LV SV MOD BP:      82.8 ml RIGHT VENTRICLE             IVC RV Basal diam:  2.80 cm     IVC diam: 1.00 cm RV Mid diam:    1.60 cm RV S prime:     13.70 cm/s TAPSE (M-mode): 3.4 cm LEFT ATRIUM             Index        RIGHT ATRIUM          Index LA diam:        3.20 cm 2.12 cm/m   RA Area:     6.32 cm LA Vol (A2C):   40.9 ml 27.10 ml/m  RA Volume:   8.72 ml  5.78 ml/m LA Vol  (A4C):   27.8 ml 18.42 ml/m LA Biplane Vol: 33.7 ml 22.33 ml/m  AORTIC VALVE                    PULMONIC VALVE AV Area (Vmax):    3.24 cm     PV Vmax:       0.78 m/s AV Area (Vmean):   3.30 cm     PV Peak grad:  2.4 mmHg AV Area (VTI):     3.41 cm AV Vmax:           127.00 cm/s AV Vmean:          75.200 cm/s AV VTI:            0.271 m AV Peak Grad:      6.5 mmHg AV Mean Grad:      3.0 mmHg LVOT Vmax:         145.00 cm/s LVOT Vmean:        87.400 cm/s LVOT VTI:          0.326 m LVOT/AV VTI ratio: 1.20  AORTA Ao Root diam: 2.90 cm Ao Asc diam:  2.70 cm MITRAL VALVE               TRICUSPID VALVE MV Area (PHT): 2.17 cm    TR Peak grad:   5.9 mmHg MV Area VTI:   4.75 cm    TR Vmax:        121.00 cm/s MV Peak grad:  4.2 mmHg MV Mean grad:  2.0 mmHg    SHUNTS MV Vmax:       1.03 m/s    Systemic VTI:  0.33 m MV Vmean:      60.7 cm/s   Systemic Diam: 1.90 cm MV Decel Time: 349 msec MV E velocity: 73.00 cm/s MV A velocity: 78.50 cm/s MV E/A ratio:  0.93 Riley Lam MD Electronically signed by Riley Lam MD Signature Date/Time: 07/13/2023/12:05:38 PM  Final    MR BRAIN WO CONTRAST  Result Date: 07/12/2023 CLINICAL DATA:  Nonverbal, right hand numbness, seizure like activity EXAM: MRI HEAD WITHOUT CONTRAST TECHNIQUE: Multiplanar, multiecho pulse sequences of the brain and surrounding structures were obtained without intravenous contrast. COMPARISON:  12/24/2018 MRI head, correlation is also made with 07/12/2023 CT head FINDINGS: Brain: No restricted diffusion to suggest acute or subacute infarct. No acute hemorrhage, mass, mass effect, or midline shift. No hydrocephalus or extra-axial collection. Pituitary and craniocervical junction within normal limits. No hemosiderin deposition to suggest remote hemorrhage. Scattered T2 hyperintense signal in the periventricular white matter and pons, likely the sequela of chronic small vessel ischemic disease. Vascular: Normal arterial flow voids. Skull and  upper cervical spine: Normal marrow signal. Sinuses/Orbits: Mucosal thickening and air-fluid level in the right maxillary sinus. Mucosal thickening in the ethmoid air cells and frontal sinuses. No acute finding in the orbits. Status post bilateral lens replacements. Other: Fluid in the right-greater-than-left mastoid air cells. IMPRESSION: 1. No acute intracranial process. No evidence of acute or subacute infarct. 2. Mucosal thickening and air-fluid level in the right maxillary sinus, which can be seen in the setting of acute sinusitis. Electronically Signed   By: Wiliam Ke M.D.   On: 07/12/2023 19:23   CT ANGIO HEAD NECK W WO CM W PERF  Result Date: 07/12/2023 CLINICAL DATA:  Neuro deficit, acute, stroke suspected. Right hand numbness. Altered mental status. History of lung cancer. EXAM: CT ANGIOGRAPHY HEAD AND NECK CT PERFUSION BRAIN TECHNIQUE: Multidetector CT imaging of the head and neck was performed using the standard protocol during bolus administration of intravenous contrast. Multiplanar CT image reconstructions and MIPs were obtained to evaluate the vascular anatomy. Carotid stenosis measurements (when applicable) are obtained utilizing NASCET criteria, using the distal internal carotid diameter as the denominator. Multiphase CT imaging of the brain was performed following IV bolus contrast injection. Subsequent parametric perfusion maps were calculated using RAPID software. RADIATION DOSE REDUCTION: This exam was performed according to the departmental dose-optimization program which includes automated exposure control, adjustment of the mA and/or kV according to patient size and/or use of iterative reconstruction technique. CONTRAST:  OMNIPAQUE IOHEXOL 350 MG/ML SOLN COMPARISON:  CTA head and neck 06/08/2021 FINDINGS: CTA NECK FINDINGS Aortic arch: Standard 3 vessel aortic arch with mild-to-moderate calcified and soft plaque. No significant stenosis of the arch vessel origins. Right carotid  system: Patent without evidence of stenosis or dissection. Partially retropharyngeal course of the proximal ICA. Left carotid system: Patent without evidence of a significant common carotid or internal carotid artery stenosis or dissection. Moderate stenosis of the proximal ECA. Vertebral arteries: Patent without evidence a significant stenosis or dissection. Dominant left vertebral artery. Skeleton: Mild cervical spondylosis. Other neck: No evidence of cervical lymphadenopathy or mass. Upper chest: Mild scarring and peribronchial thickening in the upper lobes. Review of the MIP images confirms the above findings CTA HEAD FINDINGS Anterior circulation: The internal carotid arteries are patent from skull base to carotid termini with mild atherosclerosis resulting in no more than mild right cavernous stenosis, unchanged. ACAs and MCAs are patent without evidence of a proximal branch occlusion or significant proximal stenosis. No aneurysm is identified. Posterior circulation: The intracranial vertebral arteries are widely patent to the basilar. Patent PICA and SCA origins are visualized bilaterally. The basilar artery is widely patent. Posterior communicating arteries are diminutive or absent. Both PCAs are patent without evidence of a significant proximal stenosis. No aneurysm is identified. Venous sinuses: Patent. Anatomic variants:  None. Review of the MIP images confirms the above findings CT Brain Perfusion Findings: The study is motion degraded. ASPECTS: 10 CBF (<30%) Volume: 0 mL Perfusion (Tmax>6.0s) volume: 9 mL, located in the anteroinferior right frontal lobe and considered to be artifactual IMPRESSION: 1. No large vessel occlusion or flow limiting proximal intracranial stenosis. 2. Cervical carotid atherosclerosis without significant common carotid or internal carotid artery stenosis. 3. No evidence of core infarct or penumbra on CTP. 4.  Aortic Atherosclerosis (ICD10-I70.0). Electronically Signed   By:  Sebastian Ache M.D.   On: 07/12/2023 16:50   CT HEAD CODE STROKE WO CONTRAST  Result Date: 07/12/2023 CLINICAL DATA:  Code stroke.  Right hand numbness EXAM: CT HEAD WITHOUT CONTRAST TECHNIQUE: Contiguous axial images were obtained from the base of the skull through the vertex without intravenous contrast. RADIATION DOSE REDUCTION: This exam was performed according to the departmental dose-optimization program which includes automated exposure control, adjustment of the mA and/or kV according to patient size and/or use of iterative reconstruction technique. COMPARISON:  CT head 05/14/23 FINDINGS: Brain: No hemorrhage. No hydrocephalus. No extra-axial fluid collection. No mass effect. No mass lesion. Possible age indeterminate infarct in the right cerebellum (series 2, image 12). There is a background of mild chronic microvascular ischemic change. Vascular: No hyperdense vessel or unexpected calcification. Skull: Normal. Negative for fracture or focal lesion. Sinuses/Orbits: No acute finding. Other: None. ASPECTS [facility] Stroke Program Early CT Score): 10 IMPRESSION: No hemorrhage. Possible age indeterminate infarct in the medial right cerebellum. Electronically Signed   By: Lorenza Cambridge M.D.   On: 07/12/2023 14:58       Kathlen Mody M.D. [facility] 07/13/2023, 1:57 PM  Available via Epic secure chat 7am-7pm After 7 pm, please refer to night coverage provider listed on amion.

## 2023-07-13 NOTE — Consult Note (Signed)
NEUROLOGY CONSULT NOTE   Date of service: July 13, 2023 Patient Name: Lindsay Brooks MRN:  [id number] DOB:  04-Nov-1950 Chief Complaint: "Spells of staring off, not able to speak" Requesting Provider: Kathlen Mody, MD  History of Present Illness  Lindsay Brooks is a 72 y.o. female  has a past medical history of Anemia, Arthritis, CAD (coronary artery disease), CKD (chronic kidney disease), Colon polyps, COPD (chronic obstructive pulmonary disease) (HCC), Depression, GERD (gastroesophageal reflux disease), echocardiogram, Insomnia, Mini stroke, Osteoporosis, Other and unspecified hyperlipidemia, Other isolated or specific phobias, Peripheral vascular disease, unspecified (HCC), Personal history of peptic ulcer disease, Psychiatric pseudoseizure, TIA (transient ischemic attack), Tobacco use disorder, Type II or unspecified type diabetes mellitus without mention of complication, not stated as uncontrolled, and Unspecified essential hypertension.   Patient presented to [facility] 12/2 for evaluation of an episode of left hand numbness, tongue twisting within her mouth, and unable to speak.  Patient reportedly on her way to [facility] for her first radiation therapy for her lung cancer on 07/12/23 when she had an episode where she asked her daughter to pull over, complained of left hand numbness, her tongue was seen to be twisted inside of her mouth, and then she was not verbally responding for which patient's daughter took her to the ED.  The entire event was estimated to last approximately 15 minutes.  During the initial ED evaluation, the patient's symptoms subsided and she was taken for a CT scan during which, the patient was seen with possible seizure-like activity with bilateral shoulder shaking lasting 30 seconds s/p lorazepam administration prompting neurology consultation.   Patient's family reports multiple episodes of loss of consciousness at home followed by an unresponsive state in the past occurring  approximately 3 times weekly but always after physical exertion and relieved by rest and supplemental oxygen.  At times, these events are reported after the patient stands up too quickly from sitting.  Her daughter states that she tries to sternal rub the patient during these events without response from the patient.  The patient's daughter states that the patient is aware of her surroundings during these events but that she is unable to respond and has memory of the events following. Other transient episodes of left hand numbness/tingling/weakness are reported approximately once weekly by patient's family.   Chart review supports the patient having multiple presentations of shaking, left sided weakness, and poor responsiveness most consistent with functional neurologic disorder in the past with pseudoseizure work up in 2022 at [facility].  Patient's outpatient neurologist also documents witnessed functional presentations as recently as September of this year described as a shaking spell.  Patient had an episode of shaking, tachypnea, and significant hypotension with standing orthostatic vital signs documented in May of 2024 and shaking while talking to staff in March of 2024 lasting 30-40 seconds during which she notified staff that she was having pseudoseizures.   ROS  Comprehensive ROS performed and pertinent positives documented in HPI   Past History   Past Medical History:  Diagnosis Date   Anemia    Arthritis    CAD (coronary artery disease)    a. s/p 2 CoStar DES to RCA 06/2004. b.  LHC 12/09:  EF 65%, oCFX 40%, oRCA 30%, then 30-40% leading into stented seg that is patent, (10-20% ISR), 40% beyond stent,;  c. EF 81%, poor exercise capacity, hypotensive BP response, + chest pain, normal nuclear images (overall low risk)   CKD (chronic kidney disease)    Colon  polyps    a. s/p removal 01/2012 - was told one might be cancerous but was not diagnosed with colon cancer - plan is for f/u  colonoscopy in 5 yrs.   COPD (chronic obstructive pulmonary disease) (HCC)    Depression    GERD (gastroesophageal reflux disease)    Hx of echocardiogram    a. Echo 06/2011: EF 55-60%, Gr 1 diast dysfn, mild MR   Insomnia    Mini stroke    History of, in 2005 (before she was diagnosed with CAD).   Osteoporosis    Other and unspecified hyperlipidemia    Other isolated or specific phobias    Peripheral vascular disease, unspecified (HCC)    a. s/p L common femoral & left illiac stenting previously (patent by angio in 2007). - ABI 06/2011 normal. b. Carotid US 0-39% BICA 06/2011 (for f/u 2 yrs);   c.  ABIs 9/13:  normal (1.1 bilaterally)   Personal history of peptic ulcer disease    Remotely.   Psychiatric pseudoseizure    per patient and family   TIA (transient ischemic attack)    Tobacco use disorder    Type II or unspecified type diabetes mellitus without mention of complication, not stated as uncontrolled    Unspecified essential hypertension    Past Surgical History:  Procedure Laterality Date   APPENDECTOMY     BREAST BIOPSY     BRONCHIAL BIOPSY  04/19/2023   Procedure: BRONCHIAL BIOPSIES;  Surgeon: Leslye Peer, MD;  Location: [facility] ENDOSCOPY;  Service: Pulmonary;;   BRONCHIAL BRUSHINGS  04/19/2023   Procedure: BRONCHIAL BRUSHINGS;  Surgeon: Leslye Peer, MD;  Location: [facility] ENDOSCOPY;  Service: Pulmonary;;   BRONCHIAL NEEDLE ASPIRATION BIOPSY  04/19/2023   Procedure: BRONCHIAL NEEDLE ASPIRATION BIOPSIES;  Surgeon: Leslye Peer, MD;  Location: [facility] ENDOSCOPY;  Service: Pulmonary;;   BRONCHIAL WASHINGS  04/19/2023   Procedure: BRONCHIAL WASHINGS;  Surgeon: Leslye Peer, MD;  Location: [facility] ENDOSCOPY;  Service: Pulmonary;;   CARDIAC CATHETERIZATION  2009   patent RCA stent. mild CAD otherwise.    CATARACT EXTRACTION, BILATERAL  2016   CHOLECYSTECTOMY  2009   FIDUCIAL MARKER PLACEMENT  04/19/2023   Procedure: FIDUCIAL MARKER PLACEMENT;  Surgeon: Leslye Peer, MD;  Location: [facility]  ENDOSCOPY;  Service: Pulmonary;;   FLEXIBLE SIGMOIDOSCOPY N/A 10/21/2022   Procedure: Arnell Sieving;  Surgeon: Charna Elizabeth, MD;  Location: [facility] ENDOSCOPY;  Service: Gastroenterology;  Laterality: N/A;   hysterectomy (other)     age 68   KNEE ARTHROSCOPY Left    left arm surgery     LEFT HEART CATHETERIZATION WITH CORONARY ANGIOGRAM N/A 01/16/2014   Procedure: LEFT HEART CATHETERIZATION WITH CORONARY ANGIOGRAM;  Surgeon: Micheline Chapman, MD;  Location: [facility] CATH LAB;  Service: Cardiovascular;  Laterality: N/A;   left leg angiography     stenting of the left common iliac artery   left rotator cuff repair     right coronary artery stent     Family History: Family History  Problem Relation Age of Onset   Heart attack Mother    Allergies Mother    Heart attack Father    Allergies Father    Kidney disease Father    Hypertension Father    Melanoma Sister    Coronary artery disease Brother    Breast cancer Maternal Aunt    Breast cancer Maternal Aunt    Breast cancer Paternal Aunt        d. under 71  Pancreatic cancer Maternal Grandmother    Breast cancer Daughter        BRCA1+   Social History  reports that she has been smoking cigarettes. She has a 66 pack-year smoking history. She has never used smokeless tobacco. She reports that she does not drink alcohol and does not use drugs.  Allergies  Allergen Reactions   Amoxicillin Hives and Nausea And Vomiting   Claritin [Loratadine] Other (See Comments)    Pt's daughter notified us of this allergy (reaction?)   Insulins Nausea And Vomiting and Other (See Comments)    Patient unsure of name of insulin   Vioxx [Rofecoxib] Hives   Celecoxib Hives and Nausea And Vomiting   Liraglutide Nausea And Vomiting and Other (See Comments)    Victoza   Propranolol Diarrhea and Nausea And Vomiting   Medications   Current Facility-Administered Medications:    [COMPLETED] sodium chloride 0.9 % bolus 500 mL, 500 mL, Intravenous, Once,  Stopped at 07/12/23 1710 **FOLLOWED BY** 0.9 %  sodium chloride infusion, 100 mL/hr, Intravenous, Continuous, Nolberto Hanlon, MD, Stopped at 07/13/23 0732   acetaminophen (TYLENOL) tablet 650 mg, 650 mg, Oral, Q6H PRN **OR** acetaminophen (TYLENOL) suppository 650 mg, 650 mg, Rectal, Q6H PRN, Nolberto Hanlon, MD   aspirin chewable tablet 81 mg, 81 mg, Oral, Daily, Nolberto Hanlon, MD, 81 mg at 07/13/23 1056   busPIRone (BUSPAR) tablet 7.5 mg, 7.5 mg, Oral, Daily, Nolberto Hanlon, MD, 7.5 mg at 07/13/23 1053   DULoxetine (CYMBALTA) DR capsule 60 mg, 60 mg, Oral, QHS, Dahal, Binaya, MD   enoxaparin (LOVENOX) injection 30 mg, 30 mg, Subcutaneous, Q24H, Nolberto Hanlon, MD, 30 mg at 07/13/23 1053   ezetimibe (ZETIA) tablet 10 mg, 10 mg, Oral, Daily, Dahal, Binaya, MD, 10 mg at 07/13/23 1056   insulin aspart (novoLOG) injection 0-5 Units, 0-5 Units, Subcutaneous, QHS, Nolberto Hanlon, MD   insulin aspart (novoLOG) injection 0-9 Units, 0-9 Units, Subcutaneous, TID WC, Nolberto Hanlon, MD   insulin glargine-yfgn (SEMGLEE) injection 10 Units, 10 Units, Subcutaneous, QHS, Nolberto Hanlon, MD, 10 Units at 07/12/23 2333   metoprolol tartrate (LOPRESSOR) injection 5 mg, 5 mg, Intravenous, Q6H, Nolberto Hanlon, MD   metoprolol tartrate (LOPRESSOR) tablet 25 mg, 25 mg, Oral, BID, Dahal, Binaya, MD, 25 mg at 07/13/23 1055   pantoprazole (PROTONIX) injection 40 mg, 40 mg, Intravenous, Q24H, Nolberto Hanlon, MD, 40 mg at 07/12/23 2217   polyethylene glycol (MIRALAX / GLYCOLAX) packet 17 g, 17 g, Oral, Daily PRN, Nolberto Hanlon, MD   sodium chloride flush (NS) 0.9 % injection 3 mL, 3 mL, Intravenous, Q12H, Nolberto Hanlon, MD, 3 mL at 07/13/23 1055   valproate (DEPACON) 125 mg in dextrose 5 % 50 mL IVPB, 125 mg, Intravenous, Q6H, Nolberto Hanlon, MD, Stopped at 07/13/23 0759   zonisamide (ZONEGRAN) capsule 200 mg, 200 mg, Oral, QHS, Nolberto Hanlon, MD  Current Outpatient Medications:    acetaminophen (TYLENOL) 500 MG tablet, Take 1,000 mg by mouth as needed for  moderate pain., Disp: , Rfl:    amLODipine (NORVASC) 2.5 MG tablet, Take 2.5 mg by mouth 2 (two) times daily., Disp: , Rfl:    aspirin EC 81 MG tablet, Take 1 tablet (81 mg total) by mouth daily., Disp: 30 tablet, Rfl: 11   busPIRone (BUSPAR) 7.5 MG tablet, Take 7.5 mg by mouth daily as needed (for anxiety)., Disp: , Rfl:    clopidogrel (PLAVIX) 75 MG tablet, Take 1 tablet (75 mg total) by mouth daily., Disp: , Rfl:  divalproex (DEPAKOTE) 250 MG DR tablet, Take 250 mg by mouth 2 (two) times daily., Disp: , Rfl:    donepezil (ARICEPT) 5 MG tablet, Take 5 mg by mouth daily., Disp: , Rfl:    DULoxetine (CYMBALTA) 60 MG capsule, Take 60 mg by mouth at bedtime., Disp: , Rfl:    famotidine (PEPCID) 20 MG tablet, Take 20 mg by mouth 2 (two) times daily., Disp: , Rfl:    ferrous sulfate 325 (65 FE) MG tablet, Take 325 mg by mouth daily with breakfast., Disp: , Rfl:    fexofenadine (ALLEGRA) 180 MG tablet, Take 180 mg by mouth as needed for allergies., Disp: , Rfl:    hydrALAZINE (APRESOLINE) 50 MG tablet, Take 50 mg by mouth in the morning and at bedtime., Disp: , Rfl:    ipratropium-albuterol (DUONEB) 0.5-2.5 (3) MG/3ML SOLN, Take 3 mLs by nebulization every 6 (six) hours as needed (for shortness of breath or wheezing)., Disp: , Rfl:    JARDIANCE 25 MG TABS tablet, Take 25 mg by mouth daily., Disp: , Rfl:    KLOR-CON M10 10 MEQ tablet, TAKE 1 TABLET BY MOUTH EVERY DAY (Patient taking differently: Take 10 mEq by mouth at bedtime.), Disp: 90 tablet, Rfl: 1   LANTUS SOLOSTAR 100 UNIT/ML Solostar Pen, Inject 20 Units into the skin at bedtime., Disp: , Rfl:    metoprolol tartrate (LOPRESSOR) 25 MG tablet, Take 25 mg by mouth 2 (two) times daily., Disp: , Rfl:    nitroGLYCERIN (NITROSTAT) 0.4 MG SL tablet, Place 0.4 mg under the tongue every 5 (five) minutes as needed for chest pain., Disp: , Rfl:    PROLIA 60 MG/ML SOSY injection, Inject 60 mg into the skin every 6 (six) months., Disp: , Rfl:     valsartan-hydrochlorothiazide (DIOVAN-HCT) 320-25 MG tablet, Take 1 tablet by mouth daily., Disp: , Rfl:    zonisamide (ZONEGRAN) 100 MG capsule, Take 200 mg by mouth at bedtime., Disp: , Rfl:    ezetimibe (ZETIA) 10 MG tablet, Take 10 mg by mouth daily., Disp: , Rfl:   Vitals   Vitals:   07/13/23 1000 07/13/23 1030 07/13/23 1100 07/13/23 1130  BP: (!) 157/65 (!) 146/71 (!) 171/63 (!) 149/62  Pulse: (!) 58 68 65 63  Resp: (!) 21 (!) 22 20 (!) 21  Temp:      TempSrc:      SpO2: 100% 98% 97% 93%  Weight:        Body mass index is 22.3 kg/m.  Physical Exam   Constitutional: Appears well-developed and well-nourished, elderly caucasian female in no acute distress. Initially patient is resting in ER stretcher.  Psych: Affect appropriate to situation. She does get angry with examiner during the exam when clarifying sensation to light touch in patient's lower extremities then refuses further participation in exam.  Eyes: No scleral injection.  HENT: No OP obstruction. Patient is edentulous. She is hard of hearing at baseline but hearing is improved with placement of hearing aids.  Head: Normocephalic.  Cardiovascular: Normal rate and regular rhythm, occasional bradycardia on cardiac monitor Respiratory: Effort normal, non-labored breathing on room air.  GI: Soft.  No distension. There is no tenderness.  Skin: WDI.   Neurologic Examination   Mental Status: Patient is asleep initially, wakes to loud voice and light touch.  She is able to state her name and age correctly.  She occasionally has delayed responses but she is able to state the month, year, and place correctly. She states that she  is in the hospital because "my brain went haywire".  Speech is clear without dysarthria. There is no evidence of aphasia.  Patient states that she has limited recollection of the events leading to hospitalization.  She follows simple commands, at times needs repeat instruction.  Cranial Nerves: II:  Visual Fields are full. PERRL.  III,IV, VI: EOMI without ptosis or diploplia.  V: Patient reports absent sensation to light touch on the right face, splitting at the midline forehead VII: Facial movement is symmetric resting and with movement VIII: Hearing is intact to voice with hearing aids in place X: Palate elevates symmetrically XI: Shoulder shrug is symmetric. XII: Tongue protrudes midline without atrophy or fasciculations.  Motor: Tone is normal. Bulk is normal.  Patient is able to elevate bilateral upper extremities antigravity without vertical drift.  With intention, patient had significant bilateral upper extremity tremors that resolves with distraction and rest. Right lower extremity is able to elevate antigravity without vertical drift.  Left lower extremity is able to slightly elevate antigravity with wincing with elevation. Sensory: Patient reports absent sensation to the right upper extremity to light touch though briskly to noxious stimuli. Patient reports equal sensation to bilateral lower extremities though to DDS, patient consistently reports that examiner is touching left leg only.  With clarification, patient gets frustrated and then refuses further examination by examiner. Gait: Deferred Coordination: Patient does not perform (refuses further assessment after sensory testing)  Labs/Imaging/Neurodiagnostic studies   CBC:  Recent Labs  Lab 08-03-23 1439 08/03/23 1450 08/03/2023 2048 07/13/23 0545  WBC 7.9  --  4.9 4.5  NEUTROABS 4.5  --   --   --   HGB 9.8*   < > 8.5* 8.0*  HCT 33.6*   < > 28.8* 27.2*  MCV 84.4  --  83.5 84.7  PLT 307  --  232 205   < > = values in this interval not displayed.   Basic Metabolic Panel:  Lab Results  Component Value Date   NA 142 07/13/2023   K 3.3 (L) 07/13/2023   CO2 20 (L) 07/13/2023   GLUCOSE 79 07/13/2023   BUN 30 (H) 07/13/2023   CREATININE 0.97 07/13/2023   CALCIUM 8.0 (L) 07/13/2023   GFRNONAA >60 07/13/2023    GFRAA 53 (L) 12/23/2018   Lipid Panel:  Lab Results  Component Value Date   LDLCALC 52 04/09/2012   HgbA1c:  Lab Results  Component Value Date   HGBA1C 5.9 (H) 08-03-2023   Urine Drug Screen:     Component Value Date/Time   LABOPIA NONE DETECTED 08-03-2023 2005   COCAINSCRNUR NONE DETECTED 08/03/23 2005   LABBENZ NONE DETECTED 03-Aug-2023 2005   AMPHETMU NONE DETECTED 2023/08/03 2005   THCU NONE DETECTED August 03, 2023 2005   LABBARB NONE DETECTED 08/03/2023 2005    Alcohol Level     Component Value Date/Time   ETH <10 August 03, 2023 1439   INR  Lab Results  Component Value Date   INR 1.2 07/13/2023   APTT  Lab Results  Component Value Date   APTT 26 07/13/2023   AED levels: No results found for: "PHENYTOIN", "ZONISAMIDE", "LAMOTRIGINE", "LEVETIRACETA"  CT Head without contrast(Personally reviewed): No hemorrhage. Possible age indeterminate infarct in the medial right cerebellum.  CT angio Head and Neck with contrast with CT cerebral perfusion (Personally reviewed): 1. No large vessel occlusion or flow limiting proximal intracranial stenosis. 2. Cervical carotid atherosclerosis without significant common carotid or internal carotid artery stenosis. 3. No evidence of core infarct or  penumbra on CTP. 4.  Aortic Atherosclerosis (ICD10-I70.0).  MRI Brain wo contrast (Personally reviewed): 1. No acute intracranial process. No evidence of acute or subacute infarct. 2. Mucosal thickening and air-fluid level in the right maxillary sinus, which can be seen in the setting of acute sinusitis.  Neurodiagnostics rEEG:  Pending   ASSESSMENT   Lindsay Brooks is a 72 y.o. female  has a past medical history of Anemia, Arthritis, CAD (coronary artery disease), CKD (chronic kidney disease), Colon polyps, COPD (chronic obstructive pulmonary disease) (HCC), Depression, GERD (gastroesophageal reflux disease), echocardiogram, Insomnia, Mini stroke, Osteoporosis, Other and unspecified  hyperlipidemia, Other isolated or specific phobias, Peripheral vascular disease, unspecified (HCC), Personal history of peptic ulcer disease, Psychiatric pseudoseizure, TIA (transient ischemic attack), Tobacco use disorder, Type II or unspecified type diabetes mellitus without mention of complication, not stated as uncontrolled, and Unspecified essential hypertension. Who presented to the ED for evaluation of left hand numbness, tongue twisting up, and nonverbal state that resolved in 15 minutes.  While in CT patient patient had a shaking episode of bilateral shoulders for approximately 30 seconds and was given lorazepam with resolution of symptoms. Teleneurology was consulted due to presenting complaints concerning for possible TIA but she was not treated as her exam was consistently fluctuating and her neuroimaging was unremarkable.  Patient was for admission stroke workup and EEG.    During initial neurology evaluation, patient had an episode of unresponsiveness while laying in the bed where she had a partially right, slightly upward gaze that was easily overcome by passive head positioning, she stopped verbally responding, and did not follow commands. Her vital signs were stable throughout, patient avoided hitting her face or slowly dropped her hand to avoid injury or pain with her hand lifted above her head repeatedly, she was blinking to threat throughout, and winced and withdrew to noxious stimuli which aborted the spell within 90 seconds of onset. The patient acted confused following asking examiner "who are you" but was able to answer orientation questions. Patient's daughter reports these events have never occurred at rest prior to this event. Overall, the presentation is suspicious for a functional neurologic disorder.  Will obtain EEG to rule out seizures and complete the TIA work up to evaluate the transient left hand numbness on arrival.   RECOMMENDATIONS  - Orthostatic vital signs - Routine  EEG - Hold off on AED administration at this time pending EEG findings - Psychiatry evaluation may be helpful to patient   ______________________________________________________________________  Signed, Kara Mead, NP [facility]  I have seen the patient reviewed the above note.  I witnessed an episode consisting of decreased responsiveness with side-to-side head shaking.  She continue to avoid her face with her arm during the episode, had no postictal state.  She also has significant tremor bilaterally which is clearly distractible.  Both of these complaints I suspect are functional in nature.  Some of what is described does sound like it could be orthostatic, and the patient was orthostatic by vital signs here.  I would favor focusing on this for the time being.  I would not consider these episodes TIA especially since she has had multiple similar previous episodes and presentations in the past with negative workup.  No further neurological workup needed at this time, please call with further questions or concerns remain.  Ritta Slot, MD [facility] [phone]  If 7pm- 7am, please page neurology on call as listed in [facility]ION.

## 2023-07-13 NOTE — Progress Notes (Incomplete)
PROGRESS NOTE  Lindsay Brooks  DOB: [birth year]  PCP: [name], MD GNF:[id number]  DOA: 07/12/2023  LOS: 0 days  Hospital Day: 2  Brief narrative: Lindsay Brooks is a 72 y.o. female with PMH significant for DM2, HTN, HLD, CAD/stents, CKD, PAD, COPD on 2 L/min, TIA, h/o seizure, lung cancer, breast cancer 12/2, patient was brought to the ED with complaint of unresponsiveness, weakness, numbness. For the last 2 months, patient has had off-and-on episodes of loss of consciousness that are related to exertion without any hypoxemia below 90% on several readings by the family.  She has had 2-3 episodes of/instances every week and it promptly resolved after oxygen and rest. Further for about a year patient has had episodes of left arm/hand tingling associated with twisting of the tongue.  This happens about once every week and self resolves. She was seen by neurology in the past On 12/2, patient had an episode of unresponsiveness that lasted about 30 minutes.  Later daughter was driving her to radiation therapy for lung cancer.  While in the car, patient reported left hand numbness, tingling associated with twisting of the tongue and altered mental status.  Daughter drove her to the ED  In the ED, patient was initially nonverbal, looking around.  She slowly gained consciousness and started to talk.  She also reported right leg weakness. She was taken to CT scan where she had a seizure-like activity lasting 30 seconds requiring Ativan.  Neurology evaluation was obtained.  She had inconsistent alertness and fluctuating findings during the evaluation.    In the ED, hemodynamically stable Labs showed WBC count normal, hemoglobin 9.8, BUN/creatinine 40/1.28 Blood alcohol level not elevated Urinalysis showed hazy yellow urine with negative leukocytes, negative nitrate, rare bacteria Urine drug screen negative CT head did not show any hemorrhage, showed possible age indeterminate infarct in the  medial right cerebellum. MRI brain did not show any acute intracranial process or evidence of acute or subacute infarct. CTA head and neck did not show any large vessel occlusion or significant stenosis.  Subjective: Patient was seen and examined *** Remains hemodynamically stable Most recent labs from this morning showed potassium low at 3.3, low at 8, D-dimer elevated to 2.78***  Assessment and plan: Recurrent syncope Pending echocardiogram Continue telemetry   Stroke-like symptoms Seizure like activity H/o pseudoseizures Presentation of left arm/hand numbness leg tingling associated with tongue twisting as well as change in speech.  Followed by seizure-like activity in the CAT scan room in the ER.  With prompt return to neurologic baseline afterwards.  In the setting of prior several episodes of similar presentations in the past as per daughter.  Imaging remarkable for acute findings Neurology following.  Noted a plan of EEG PTA meds- ***Depakote 250 mg twice daily, Zonisamide 200 mg daily  Hypokalemia Potassium level low at 3.3 today.  Replacement ordered Recent Labs  Lab 07/12/23 1439 07/12/23 1450 07/13/23 0545  K 4.1 4.4 3.3*     Type 2 diabetes mellitus A1c 5.9 on 07/12/2023 PTA meds-***Lantus 20 units nightly, Jardiance 25 mg daily, *** SSI/Accu-Cheks Lab Results  Component Value Date   HGBA1C 5.9 (H) 07/12/2023   Recent Labs  Lab 07/12/23 1435 07/12/23 2216 07/13/23 0601 07/13/23 0759  GLUCAP 108* 84 75 87   Hypertension PTA meds- ***metoprolol tartrate 25 mg twice daily, amlodipine 2.5 mg twice daily, valsartan 320 mg daily, HCTZ 25 mg daily,  CAD/stents PAD HLD PTA meds- ***aspirin 81 mg daily, Plavix 75 mg daily,  Zetia 10 mg daily,  CKD 3b Creatinine at baseline. Recent Labs    10/22/22 0857 11/12/22 1316 12/11/22 0000 12/25/22 0000 01/12/23 1325 04/09/23 1429 06/03/23 1511 07/12/23 1439 07/12/23 1450 07/12/23 2048 07/13/23 0545   BUN 10 16 29* 18 18 18  27* 40* 35*  --  30*  CREATININE 0.73 1.13* 1.3* 1.0 0.98 1.08* 1.17* 1.28* 1.40* 1.10* 0.97   Non-small cell lung cancer COPD on 2 L/min Follows up with radiation oncology  H/o breast cancer  Anxiety/depression PTA meds- ***Aricept 5 mg daily, Cymbalta 60 mg bedtime,     Mobility: ***  Goals of care   Code Status: Full Code  ***   DVT prophylaxis:***  enoxaparin (LOVENOX) injection 30 mg Start: 07/13/23 0800 SCDs Start: 07/12/23 2017   Antimicrobials: *** Fluid: *** Consultants: *** Family Communication: ***  Status: *** Level of care:  Telemetry Medical   Patient is from: *** Needs to continue in-hospital care: *** Anticipated d/c to: ***      Diet: *** Diet Order             Diet NPO time specified  Diet effective now                   Scheduled Meds:  aspirin  81 mg Oral Daily   busPIRone  7.5 mg Oral Daily   enoxaparin (LOVENOX) injection  30 mg Subcutaneous Q24H   insulin aspart  0-5 Units Subcutaneous QHS   insulin aspart  0-9 Units Subcutaneous TID WC   insulin glargine-yfgn  10 Units Subcutaneous QHS   metoprolol tartrate  5 mg Intravenous Q6H   pantoprazole (PROTONIX) IV  40 mg Intravenous Q24H   sodium chloride flush  3 mL Intravenous Q12H   zonisamide  200 mg Oral QHS    PRN meds: acetaminophen **OR** acetaminophen, polyethylene glycol   Infusions:   sodium chloride Stopped (07/13/23 0732)   valproate sodium Stopped (07/13/23 0759)    Antimicrobials: Anti-infectives (From admission, onward)    None       Objective: Vitals:   07/13/23 0801 07/13/23 0830  BP:  (!) 160/65  Pulse:  67  Resp:  20  Temp: 98.1 F (36.7 C)   SpO2:  98%    Intake/Output Summary (Last 24 hours) at 07/13/2023 0905 Last data filed at 07/12/2023 1710 Gross per 24 hour  Intake 500 ml  Output --  Net 500 ml   Filed Weights   07/12/23 1657  Weight: 53.5 kg   Weight change:  Body mass index is 22.3 kg/m.    Physical Exam: General exam: *** Skin: No rashes, lesions or ulcers. HEENT: Atraumatic, normocephalic, no obvious bleeding Lungs: *** CVS: *** GI/Abd *** CNS: *** Psychiatry: *** Extremities: ***  Data Review: I have personally reviewed the laboratory data and studies available.  F/u labs *** Unresulted Labs (From admission, onward)     Start     Ordered   07/19/23 0500  Creatinine, serum  (enoxaparin (LOVENOX)    CrCl >/= 30 ml/min)  Weekly,   R     Comments: while on enoxaparin therapy    07/12/23 2017            Admission date and time: 07/12/2023  2:34 PM   Total time spent in review of labs and imaging, patient evaluation, formulation of plan, documentation and communication with family: *** minutes  Signed, Lorin Glass, MD [facility] 07/13/2023

## 2023-07-13 NOTE — ED Notes (Signed)
Pt taken to CT.

## 2023-07-14 ENCOUNTER — Telehealth: Payer: Self-pay

## 2023-07-14 ENCOUNTER — Other Ambulatory Visit: Payer: Self-pay

## 2023-07-14 ENCOUNTER — Ambulatory Visit
Admission: RE | Admit: 2023-07-14 | Discharge: 2023-07-14 | Disposition: A | Discharge status: Home / Self Care | Payer: Medicare Other | Source: Ambulatory Visit | Attending: Radiation Oncology | Admitting: Radiation Oncology

## 2023-07-14 DIAGNOSIS — D508 Other iron deficiency anemias: Secondary | ICD-10-CM | POA: Diagnosis not present

## 2023-07-14 DIAGNOSIS — C3411 Malignant neoplasm of upper lobe, right bronchus or lung: Secondary | ICD-10-CM | POA: Insufficient documentation

## 2023-07-14 DIAGNOSIS — R569 Unspecified convulsions: Secondary | ICD-10-CM | POA: Diagnosis not present

## 2023-07-14 DIAGNOSIS — Z23 Encounter for immunization: Secondary | ICD-10-CM | POA: Diagnosis not present

## 2023-07-14 DIAGNOSIS — C50412 Malignant neoplasm of upper-outer quadrant of left female breast: Secondary | ICD-10-CM | POA: Diagnosis not present

## 2023-07-14 DIAGNOSIS — Z510 Encounter for antineoplastic radiation therapy: Secondary | ICD-10-CM | POA: Insufficient documentation

## 2023-07-14 DIAGNOSIS — E1122 Type 2 diabetes mellitus with diabetic chronic kidney disease: Secondary | ICD-10-CM | POA: Diagnosis not present

## 2023-07-14 DIAGNOSIS — K922 Gastrointestinal hemorrhage, unspecified: Secondary | ICD-10-CM | POA: Diagnosis not present

## 2023-07-14 DIAGNOSIS — D638 Anemia in other chronic diseases classified elsewhere: Secondary | ICD-10-CM | POA: Diagnosis not present

## 2023-07-14 DIAGNOSIS — I129 Hypertensive chronic kidney disease with stage 1 through stage 4 chronic kidney disease, or unspecified chronic kidney disease: Secondary | ICD-10-CM | POA: Diagnosis not present

## 2023-07-14 DIAGNOSIS — E041 Nontoxic single thyroid nodule: Secondary | ICD-10-CM | POA: Diagnosis not present

## 2023-07-14 DIAGNOSIS — J449 Chronic obstructive pulmonary disease, unspecified: Secondary | ICD-10-CM | POA: Diagnosis not present

## 2023-07-14 LAB — CBC WITH DIFFERENTIAL/PLATELET
Abs Immature Granulocytes: 0.01 10*3/uL (ref 0.00–0.07)
Basophils Absolute: 0 10*3/uL (ref 0.0–0.1)
Basophils Relative: 0 %
Eosinophils Absolute: 0.1 10*3/uL (ref 0.0–0.5)
Eosinophils Relative: 2 %
HCT: 27.4 % — ABNORMAL LOW (ref 36.0–46.0)
Hemoglobin: 7.8 g/dL — ABNORMAL LOW (ref 12.0–15.0)
Immature Granulocytes: 0 %
Lymphocytes Relative: 32 %
Lymphs Abs: 1.4 10*3/uL (ref 0.7–4.0)
MCH: 24.8 pg — ABNORMAL LOW (ref 26.0–34.0)
MCHC: 28.5 g/dL — ABNORMAL LOW (ref 30.0–36.0)
MCV: 87 fL (ref 80.0–100.0)
Monocytes Absolute: 0.4 10*3/uL (ref 0.1–1.0)
Monocytes Relative: 9 %
Neutro Abs: 2.5 10*3/uL (ref 1.7–7.7)
Neutrophils Relative %: 57 %
Platelets: 174 10*3/uL (ref 150–400)
RBC: 3.15 MIL/uL — ABNORMAL LOW (ref 3.87–5.11)
RDW: 18.6 % — ABNORMAL HIGH (ref 11.5–15.5)
WBC: 4.4 10*3/uL (ref 4.0–10.5)
nRBC: 0 % (ref 0.0–0.2)

## 2023-07-14 LAB — RAD ONC ARIA SESSION SUMMARY
Course Elapsed Days: 0
Plan Fractions Treated to Date: 1
Plan Prescribed Dose Per Fraction: 12 Gy
Plan Total Fractions Prescribed: 5
Plan Total Prescribed Dose: 60 Gy
Reference Point Dosage Given to Date: 12 Gy
Reference Point Session Dosage Given: 12 Gy
Session Number: 1

## 2023-07-14 LAB — BASIC METABOLIC PANEL
Anion gap: 7 (ref 5–15)
BUN: 22 mg/dL (ref 8–23)
CO2: 21 mmol/L — ABNORMAL LOW (ref 22–32)
Calcium: 8.1 mg/dL — ABNORMAL LOW (ref 8.9–10.3)
Chloride: 111 mmol/L (ref 98–111)
Creatinine, Ser: 0.92 mg/dL (ref 0.44–1.00)
GFR, Estimated: >60 mL/min (ref >60)
Glucose, Bld: 76 mg/dL (ref 70–99)
Potassium: 4 mmol/L (ref 3.5–5.1)
Sodium: 139 mmol/L (ref 135–145)

## 2023-07-14 LAB — GLUCOSE, CAPILLARY
Glucose-Capillary: 136 mg/dL — ABNORMAL HIGH (ref 70–99)
Glucose-Capillary: 77 mg/dL (ref 70–99)
Glucose-Capillary: 123 mg/dL — ABNORMAL HIGH (ref 70–99)
Glucose-Capillary: 114 mg/dL — ABNORMAL HIGH (ref 70–99)

## 2023-07-14 MED ORDER — DIVALPROEX SODIUM 250 MG PO DR TAB
250.0000 mg | DELAYED_RELEASE_TABLET | Freq: Two times a day (BID) | ORAL | Status: DC
  Administered 2023-07-14 – 2023-07-15 (×2): 250 mg via ORAL
  Filled 2023-07-14 (×2): qty 1

## 2023-07-14 MED ORDER — ALPRAZOLAM 0.25 MG PO TABS
0.2500 mg | ORAL_TABLET | Freq: Two times a day (BID) | ORAL | Status: DC
  Administered 2023-07-14 – 2023-07-15 (×3): 0.25 mg via ORAL
  Filled 2023-07-14 (×3): qty 1

## 2023-07-14 MED ORDER — NITROGLYCERIN 0.4 MG SL SUBL: 0.4000 mg | SUBLINGUAL_TABLET | SUBLINGUAL | Status: DC | PRN

## 2023-07-14 MED ORDER — NICOTINE 14 MG/24HR TD PT24
14.0000 mg | MEDICATED_PATCH | Freq: Every day | TRANSDERMAL | Status: DC
  Administered 2023-07-14 – 2023-07-15 (×2): 14 mg via TRANSDERMAL
  Filled 2023-07-14 (×2): qty 1

## 2023-07-14 NOTE — Evaluation (Signed)
Occupational Therapy Evaluation Patient Details Name: Lindsay Brooks MRN: [id number] DOB: 05/24/1951 Today's Date: 07/14/2023   History of Present Illness 72 yo female admitted 07/12/23 with episodes of unconsciousness/decreased responsiveness. Neurology following. Hx of DM, lung Ca, CVA, Sz, TIA, breat Ca, COPD, CKD, anemia, tremor   Clinical Impression   Pt is typically independent in ADL and mobility. She (and her daughter) report history of falling in the past 2 months, one of which resulted in a L shoulder injury to her rotator cuff. Pt today is overall supervision/close guard assist due to risk of falls or "episodes" but no physical assist or assist with ADL provided throughout session. During orthostatic vitals, Pt did experience an "episode" of staring off into space, non-responsive, shaking of her whole body and slight lean to one side. She did blink to threat, but did not respond verbally or physically in any capacity during this "episode" After a few min Pt said 'I'm back" but could not recall anything about the episode or commands throughout. Daughter present throughout session stating "this is why I am scared to leave her" OT reported to MD and medical team what happened during session. At this time, feel that Pt is at baseline for ADL. Educated on safety and doing ADL from seated position. OT will sign off at this time.    Orthostatic Vitals: Supine: 121/62 Sitting: 106/72 Standing: 104/77 Sitting after episode: 134/71 HR 67 O2 97% on RA       If plan is discharge home, recommend the following: Other (comment) (supervision for safety)    Functional Status Assessment  Patient has not had a recent decline in their functional status  Equipment Recommendations  None recommended by OT (Pt has appropriate DME)    Recommendations for Other Services       Precautions / Restrictions Precautions Precautions: Fall Restrictions Weight Bearing Restrictions: No      Mobility Bed  Mobility Overal bed mobility: Modified Independent                  Transfers Overall transfer level: Needs assistance   Transfers: Sit to/from Stand, Bed to chair/wheelchair/BSC Sit to Stand: Supervision     Step pivot transfers: Contact guard assist     General transfer comment: for safety. no assistance required. no lob standing at bedside. CGA for pivot. Stood in front of recliner for about 1-2 minutes-pt then became less responsive, stared into space. Assisted pt into recliner for safety. Pt with eyes open still staring off into space. She did blink eyes when OT snapped fingers towards her eyes. Raised pt R arm, arm dropped to pt's side.      Balance Overall balance assessment: History of Falls                                         ADL either performed or assessed with clinical judgement   ADL Overall ADL's : At baseline                                       General ADL Comments: Pt able to don socks EOB, she stands on her own, and could participate in ADL from a seated position. She will need to be supervision due to fall risk, but she is able to complete ADL at baseline  functioning.     Vision Baseline Vision/History: 1 Wears glasses Ability to See in Adequate Light: 0 Adequate Patient Visual Report: No change from baseline Vision Assessment?: No apparent visual deficits     Perception Perception: Within Functional Limits       Praxis Praxis: WFL       Pertinent Vitals/Pain Pain Assessment Pain Assessment: No/denies pain     Extremity/Trunk Assessment Upper Extremity Assessment Upper Extremity Assessment: Generalized weakness;Overall [facility] for tasks assessed   Lower Extremity Assessment Lower Extremity Assessment: Defer to PT evaluation   Cervical / Trunk Assessment Cervical / Trunk Assessment: Normal   Communication Communication Communication: No apparent difficulties   Cognition Arousal: Alert Behavior  During Therapy: WFL for tasks assessed/performed Overall Cognitive Status: Within Functional Limits for tasks assessed                                       General Comments  Daughter present throughout session, SpO2 on RA and WFL (97%) throughout session. BP was consistent throughout positional changes. Neuro check after "episode" was [facility]    Exercises     Shoulder Instructions      Home Living Family/patient expects to be discharged to:: Private residence Living Arrangements: Alone Available Help at Discharge: Family Type of Home: House Home Access: Ramped entrance     Home Layout: One level     Bathroom Shower/Tub: Chief Strategy Officer: Standard Bathroom Accessibility: Yes How Accessible: Accessible via wheelchair Home Equipment: Cane - single Information systems manager (2 wheels);Wheelchair - manual;Shower seat - built in;Grab bars - tub/shower;Hand held shower head          Prior Functioning/Environment Prior Level of Function : Independent/Modified Independent;History of Falls (last six months)             Mobility Comments: ambulatory without device. ADLs Comments: supervision, due to fall hx        OT Problem List: Decreased activity tolerance;Impaired balance (sitting and/or standing)      OT Treatment/Interventions:      OT Goals(Current goals can be found in the care plan section) Acute Rehab OT Goals Patient Stated Goal: go home OT Goal Formulation: With patient/family Time For Goal Achievement: 07/28/23 Potential to Achieve Goals: Good  OT Frequency:      Co-evaluation              AM-PAC OT "6 Clicks" Daily Activity     Outcome Measure Help from another person eating meals?: None Help from another person taking care of personal grooming?: None Help from another person toileting, which includes using toliet, bedpan, or urinal?: None Help from another person bathing (including washing, rinsing, drying)?:  None Help from another person to put on and taking off regular upper body clothing?: None Help from another person to put on and taking off regular lower body clothing?: None 6 Click Score: 24   End of Session Equipment Utilized During Treatment: Gait belt Nurse Communication: Mobility status;Precautions;Other (comment) (had an "episode" during orthostatic pressure reading)  Activity Tolerance: Patient tolerated treatment well Patient left: in chair;with call bell/phone within reach;with chair alarm set;with family/visitor present  OT Visit Diagnosis: Unsteadiness on feet (R26.81);Repeated falls (R29.6);History of falling (Z91.81)                Time: 1047-1110 OT Time Calculation (min): 23 min Charges:  OT General Charges $OT Visit: 1 Visit OT  Evaluation $OT Eval Low Complexity: 1 Low  Nyoka Cowden OTR/L Acute Rehabilitation Services Office: [phone]  Emelda Fear 07/14/2023, 1:21 PM

## 2023-07-14 NOTE — Progress Notes (Signed)
       Overnight   NAME: Lindsay Brooks MRN: [id number] DOB : 01-16-1951    Date of Service   07/14/2023   HPI/Events of Note    Notified by RN for occurrence of unconsciousness while in bathroom. Immediate recovery noted without recollection of he event.  No fall No deficits Ambulated to bathroom with staff under her own power  Occurred while seating for Urine and bowel movement   EKG remains as prior     Interventions/ Plan   Remain in bed and use bedpan for now to  avoid orthostatic triggers Continue vital signs  Fall precautions  Neuro checks as needed Continue all previous Attending orders      Chinita Greenland BSN MSNA MSN ACNPC-AG Acute Care Nurse Practitioner [facility]

## 2023-07-14 NOTE — Care Management Important Message (Incomplete)
Important Message  Patient Details  Name: Lindsay Brooks MRN: [id number] Date of Birth: [birth year]   Important Message Given:        Howell Rucks, RN 07/14/2023, 10:16 AM

## 2023-07-14 NOTE — Evaluation (Signed)
Physical Therapy Evaluation Patient Details Name: Lindsay Brooks MRN: [id number] DOB: 03-Jun-1951 Today's Date: 07/14/2023  History of Present Illness  72 yo female admitted with episodes of unconsciousness/decreased responsiveness. Neurology following. Hx of DM, lung Ca, CVA, Sz, TIA, breat Ca, COPD, CKD, anemia, tremor  Clinical Impression  On eval, pt was Supv-CGA for mobility (only for safety due to episodes pt is experiencing.) Assessed orthostatic vitals during session: supine 121/62, sitting 106/72, standing 104/77. Pt pivoted over to recliner with CGA. Once standing in front of recliner for ~30 seconds-1 min, pt became less responsive/stared off into space. Assisted pt into recliner-she then began shaking and continued to stare off into space while leaning over to one side. Assessed vitals again: BP 134/71, HR 67 bpm, O2 97% on RA. After several minutes, pt then began responding appropriately to OT who was speaking with her. Deferred ambulation for safety reasons. Pt lives with her daughter who has been caring for her. Pt states she is eager to go home. No acute PT needs. Recommend daily mobility with nursing and/or mobility team assistance and supervision. 1x eval. Will sign off.        If plan is discharge home, recommend the following: Direct supervision/assist for financial management;Assistance with cooking/housework;Assist for transportation   Can travel by private vehicle        Equipment Recommendations None recommended by PT  Recommendations for Other Services       Functional Status Assessment Patient has had a recent decline in their functional status and demonstrates the ability to make significant improvements in function in a reasonable and predictable amount of time.     Precautions / Restrictions Precautions Precautions: Fall Restrictions Weight Bearing Restrictions: No      Mobility  Bed Mobility Overal bed mobility: Modified Independent                   Transfers Overall transfer level: Needs assistance   Transfers: Sit to/from Stand, Bed to chair/wheelchair/BSC Sit to Stand: Supervision   Step pivot transfers: Contact guard assist       General transfer comment: for safety. no assistance required. no lob standing at bedside. CGA for pivot. Stood in front of recliner for about 1 minute-pt then became less responsive, stared off into space. Assisted pt into recliner for safety. Pt with eyes open still staring off into space. She did blink eyes when OT snapped fingers towards her eyes. Raised pt R arm, arm dropped to pt's side.    Ambulation/Gait                  Stairs            Wheelchair Mobility     Tilt Bed    Modified Rankin (Stroke Patients Only)       Balance Overall balance assessment: History of Falls                                           Pertinent Vitals/Pain Pain Assessment Pain Assessment: No/denies pain    Home Living Family/patient expects to be discharged to:: Private residence Living Arrangements: Alone Available Help at Discharge: Family Type of Home: House Home Access: Ramped entrance       Home Layout: One level Home Equipment: Cane - single Information systems manager (2 wheels);Wheelchair - manual      Prior Function Prior Level of Function :  Independent/Modified Independent;History of Falls (last six months)             Mobility Comments: ambulatory withouot device.       Extremity/Trunk Assessment   Upper Extremity Assessment Upper Extremity Assessment: Defer to OT evaluation    Lower Extremity Assessment Lower Extremity Assessment: Overall WFL for tasks assessed    Cervical / Trunk Assessment Cervical / Trunk Assessment: Normal  Communication   Communication Communication: No apparent difficulties  Cognition Arousal: Alert Behavior During Therapy: WFL for tasks assessed/performed Overall Cognitive Status: Within Functional  Limits for tasks assessed                                          General Comments      Exercises     Assessment/Plan    PT Assessment Patient does not need any further PT services (can mobilize with assistance/supervision of mobility team vs nursing)  PT Problem List         PT Treatment Interventions      PT Goals (Current goals can be found in the Care Plan section)  Acute Rehab PT Goals Patient Stated Goal: home! PT Goal Formulation: All assessment and education complete, DC therapy    Frequency       Co-evaluation               AM-PAC PT "6 Clicks" Mobility  Outcome Measure Help needed turning from your back to your side while in a flat bed without using bedrails?: None Help needed moving from lying on your back to sitting on the side of a flat bed without using bedrails?: None Help needed moving to and from a bed to a chair (including a wheelchair)?: A Little Help needed standing up from a chair using your arms (e.g., wheelchair or bedside chair)?: A Little Help needed to walk in hospital room?: A Little Help needed climbing 3-5 steps with a railing? : A Little 6 Click Score: 20    End of Session Equipment Utilized During Treatment: Gait belt Activity Tolerance:  (limited by episode of decreased responsiveness) Patient left: in chair;with call bell/phone within reach;with chair alarm set;with family/visitor present        Time: 4098-1191 PT Time Calculation (min) (ACUTE ONLY): 23 min   Charges:   PT Evaluation $PT Eval Low Complexity: 1 Low   PT General Charges $$ ACUTE PT VISIT: 1 Visit           Faye Ramsay, PT Acute Rehabilitation  Office: [phone]

## 2023-07-14 NOTE — Telephone Encounter (Signed)
Lindsay Brooks called to ask if you could call her at your convenience. She was admitted "for having them passing our spells. She had 2 of them last night. I want to know if Harvin Hazel thinks she could get the IV iron while in the hospital because they could monitor her? Kelli told me she wanted to give mama iron pills because she could have a reaction to the IV iron". [phone]

## 2023-07-14 NOTE — Progress Notes (Signed)
HOSPITALIST ROUNDING NOTE ANTINIQUE PRZYBYSZEWSKI WUJ:[id number]  DOB: 1950/11/29  DOA: 07/12/2023  PCP: [name], MD  07/14/2023,10:48 AM   LOS: 0 days      Code Status: Full code From: Home  current Dispo: Unclear     62Y female CAD with DES to RCA 2005 last cath 2009 without disease Prior colonic polyps Tobacco abuse continue TIA 2005 Peripheral vascular disease status post L CFA L iliac stenting 2007 DM TY 2 3/11 10/22/2022 lower GI bleed-aspirin Plavix held resumed at discharge seen by Dr. Milas Kocher sig normal: Large hemorrhoids  12/2 presented to emergency room with episodic unresponsiveness after coming from cancer center and slumped over in the car--some seizure activity in the ED and patient was tracking well neurology consulted  It appears that patient has pseudoseizures 3 times a week and has had multiple neurology evaluations in the past Limited syncope workup was performed She was also found to have a pulmonary nodule 1 cm Neurology saw the patient here-documented neurological disorder with pseudoseizure workup in 2022-outpatient neurologist documents witnessed functional presentations as recently as 04/2023 with shaking spells lasting 30 to 40 seconds  EEG was performed which was negative echocardiogram showed EF of 70-75% with grade 1 diastolic dysfunction   Plan  Pseudoseizures syncope Not orthostatic on repeat checks-was able to ambulate in the room-PT to evaluate for equipment etc. in the morning and if is stable she can probably discharge home with equipment-she has had these issues for several months now and has a private neurologist and our neurologist here does not think there is anything organic going on I explained this clearly to the family-she is at high risk for falls unfortunately but has been relatively cleared by PT and they will see her again tomorrow Because of possible psychosomatic component of this I have started Xanax very low-dose 0.25 twice daily  TIA  2005 Continue aspirin 81 only--- would probably hold Plavix at this time given risk of falls and can resume in the outpatient setting depending on how she does Preventative Zetia as well as nicotine patch at discharge  Previous CAD DES 2005 Aspirin as above preventative measures as above Continue metoprolol 25 twice daily  HTN Would hold Diovan HCTZ given risk of volume depletion  Normocytic anemia--history of hemorrhoidal bleed previously with normal flex sig Baseline hemoglobin seems to be 8-11 patient is at 7.8 --- As there is no significant bleed I think there is a significant dilutional component Would hold Plavix as above Would check Hemoccult in the outpatient setting and re-refer back to Dr. Elnoria Howard if no overt bleed  Anxiety--possible dementia Continue BuSpar 7.5 Cymbalta 60 at bedtime and new start alprazolam this admission She should continue her Depakote 250 twice daily and can continue her Aricept  DM TY 2 Resume Jardiance 25 at discharge Sugars are very well-controlled here below the 200 range so I will discontinue sliding scale coverage  Discussed clearly at the bedside with daughter  DVT prophylaxis: Lovenox  Status is: Observation The patient will require care spanning > 2 midnights and should be moved to inpatient because:   Requires further stability      Subjective: Monosyllabic does not answer in full sentences No distress Had some episodes of shaking and staring spells with therapy but seems to have resolved Was able to walk later in the day Therapy is aware to kindly see again in the morning  Objective + exam Vitals:   07/14/23 0420 07/14/23 0532 07/14/23 0619 07/14/23 1016  BP:  136/69 (!) 134/47 (!) 145/58  Pulse:  81 68 68  Resp: (!) 21 (!) 22 18 (!) 23  Temp:  98.5 F (36.9 C) 98.5 F (36.9 C) 97.8 F (36.6 C)  TempSrc:  Oral Oral Oral  SpO2:  100% 99% 97%  Weight:      Height:       Filed Weights   07/12/23 1657 07/13/23 2025   Weight: 53.5 kg 55.7 kg    Examination:  ER my NCAT no focal deficit no icterus no pallor no wheeze no rales no rhonchi ROM intact Power 5/5 Reflexes deferred Straight leg raise intact Smile symmetric Extraocular movements intact Chest clear Abdomen soft no rebound no guarding  Data Reviewed: reviewed   CBC    Component Value Date/Time   WBC 4.4 07/14/2023 0403   RBC 3.15 (L) 07/14/2023 0403   HGB 7.8 (L) 07/14/2023 0403   HGB 8.6 (L) 06/03/2023 1511   HCT 27.4 (L) 07/14/2023 0403   PLT 174 07/14/2023 0403   PLT 329 06/03/2023 1511   MCV 87.0 07/14/2023 0403   MCV 94 08/21/2022 0000   MCH 24.8 (L) 07/14/2023 0403   MCHC 28.5 (L) 07/14/2023 0403   RDW 18.6 (H) 07/14/2023 0403   LYMPHSABS 1.4 07/14/2023 0403   MONOABS 0.4 07/14/2023 0403   EOSABS 0.1 07/14/2023 0403   BASOSABS 0.0 07/14/2023 0403      Latest Ref Rng & Units 07/14/2023    4:03 AM 07/13/2023    5:45 AM 07/12/2023    8:48 PM  CMP  Glucose 70 - 99 mg/dL 76  79    BUN 8 - 23 mg/dL 22  30    Creatinine 4.09 - 1.00 mg/dL 8.11  9.14  7.82   Sodium 135 - 145 mmol/L 139  142    Potassium 3.5 - 5.1 mmol/L 4.0  3.3    Chloride 98 - 111 mmol/L 111  113    CO2 22 - 32 mmol/L 21  20    Calcium 8.9 - 10.3 mg/dL 8.1  8.0       Scheduled Meds:  aspirin  81 mg Oral Daily   busPIRone  7.5 mg Oral Daily   divalproex  250 mg Oral Q12H   DULoxetine  60 mg Oral QHS   enoxaparin (LOVENOX) injection  30 mg Subcutaneous Q24H   ezetimibe  10 mg Oral Daily   influenza vaccine adjuvanted  0.5 mL Intramuscular Tomorrow-1000   insulin aspart  0-5 Units Subcutaneous QHS   insulin aspart  0-9 Units Subcutaneous TID WC   metoprolol tartrate  25 mg Oral BID   nicotine  14 mg Transdermal Daily   pantoprazole  40 mg Oral Q0600   pneumococcal 20-valent conjugate vaccine  0.5 mL Intramuscular Tomorrow-1000   sodium chloride flush  3 mL Intravenous Q12H   zonisamide  200 mg Oral QHS   Continuous Infusions:  sodium  chloride 75 mL/hr at 07/14/23 1007    Time 46  Rhetta Mura, MD  [facility]

## 2023-07-14 NOTE — Progress Notes (Signed)
At 0120, patient called and state that she was having chest pain 7/10, patient also states that she felt nauseous and all of a sudden she pass out and became unresponsive, EKG and CBG were performed. After several attempts of sternal rub and stimulation patient became alert and follows command. Patient not able to remember anything that happened and states that she has no pain and comfortable in bed. CN and RRN at bedside and NP [facility] notified.   07/14/23 0139  Vitals  Temp 98.4 F (36.9 C)  Temp Source Oral  BP (!) 148/56  MAP (mmHg) 82  BP Location Right Arm  BP Method Automatic  Patient Position (if appropriate) Lying  Pulse Rate 72  Pulse Rate Source Monitor  Resp (!) 22  Level of Consciousness  Level of Consciousness Alert  Oxygen Therapy  SpO2 100 %  O2 Device Nasal Cannula  O2 Flow Rate (L/min) 2.5 L/min  Pain Assessment  Pain Scale 0-10  Pain Score 0  POSS Scale (Pasero Opioid Sedation Scale)  POSS *See Group Information* 1-Acceptable,Awake and alert  Glasgow Coma Scale  Eye Opening 4  Best Verbal Response (NON-intubated) 5  Best Motor Response 6  Glasgow Coma Scale Score 15  Provider Notification  Provider Name/Title J.Garner Nash, NP  Date Provider Notified 07/14/23  Time Provider Notified 0125  Method of Notification Page  Notification Reason Change in status  Provider response See new orders  Date of Provider Response 07/14/23  Time of Provider Response 0126  Rapid Response Notification  Name of Rapid Response RN Notified Genell G.,RN  Date Rapid Response Notified 07/14/23  Time Rapid Response Notified 0128

## 2023-07-14 NOTE — TOC Initial Note (Addendum)
Transition of Care [facility]) - Initial/Assessment Note    Patient Details  Name: Lindsay Brooks MRN: [id number] Date of Birth: September 09, 1950  [facility] CM/SW Contact:    Howell Rucks, RN Phone Number: 07/14/2023, 10:29 AM  Clinical Narrative:  Met with pt and dtr at bedside to introduce role of TOC/NCM and review for dc planning, dtr answered most of the assessment questions, confirmed pt has an established PCP and pharmacy, no current home care services, dtr lives with pt and assist with her care, home DME: walker and cane, dtr to provide transportation at discharge. PT eval, await recommendation. MOON completed. TOC will continue to follow.                  Expected Discharge Plan: Home w Home Health Services Barriers to Discharge: Continued Medical Work up   Patient Goals and CMS Choice Patient states their goals for this hospitalization and ongoing recovery are:: return home (dtr lives with pt)          Expected Discharge Plan and Services       Living arrangements for the past 2 months: Single Family Home                                      Prior Living Arrangements/Services Living arrangements for the past 2 months: Single Family Home Lives with:: Adult Children Patient language and need for interpreter reviewed:: Yes Do you feel safe going back to the place where you live?: Yes      Need for Family Participation in Patient Care: Yes (Comment) Care giver support system in place?: Yes (comment) Current home services: DME (walker, cane) Criminal Activity/Legal Involvement Pertinent to Current Situation/Hospitalization: No - Comment as needed  Activities of Daily Living   ADL Screening (condition at time of admission) Independently performs ADLs?: Yes (appropriate for developmental age) Does the patient have a NEW difficulty with bathing/dressing/toileting/self-feeding that is expected to last >3 days?: No Does the patient have a NEW difficulty  with getting in/out of bed, walking, or climbing stairs that is expected to last >3 days?: No Does the patient have a NEW difficulty with communication that is expected to last >3 days?: No Is the patient deaf or have difficulty hearing?: Yes Does the patient have difficulty seeing, even when wearing glasses/contacts?: No Does the patient have difficulty concentrating, remembering, or making decisions?: No  Permission Sought/Granted                  Emotional Assessment Appearance:: Appears stated age Attitude/Demeanor/Rapport: Gracious Affect (typically observed): Accepting Orientation: : Oriented to Self, Oriented to Place, Oriented to  Time, Oriented to Situation Alcohol / Substance Use: Not Applicable Psych Involvement: No (comment)  Admission diagnosis:  Seizures (HCC) [R56.9] Syncope [R55] Altered mental status, unspecified altered mental status type [R41.82] Patient Active Problem List   Diagnosis Date Noted   Syncope 07/13/2023   Seizures (HCC) 07/12/2023   Stroke-like symptoms 07/12/2023   Loss of consciousness (HCC) 07/12/2023   Iron deficiency anemia 06/25/2023   Primary cancer of right upper lobe of lung (HCC) 05/23/2023    Class: Diagnosis of   Boil, axilla 04/27/2023   Lung cancer, upper lobe (HCC) 04/27/2023   Psychosomatic seizure 04/27/2023   Thyroid nodule greater than or equal to 1 cm in diameter incidentally noted on imaging study 04/27/2023   COPD (chronic obstructive pulmonary disease) (  HCC) 03/25/2023   Hypocalcemia 01/15/2023    Class: Diagnosis of   Osteoporosis 01/10/2023    Class: Chronic   Acute blood loss anemia 10/20/2022   GAD (generalized anxiety disorder) 10/20/2022   Allergic rhinitis 10/20/2022   Acute lower GI bleeding 10/19/2022   Dehydration 07/10/2022   Neuropathy due to chemotherapeutic drug (HCC) 07/09/2022    Class: Chronic   Genetic testing 05/19/2022   Hypokalemia due to loss of potassium 05/18/2022   Elevated serum  creatinine 05/18/2022   Breast cancer of upper-outer quadrant of left female breast (HCC) 03/18/2022    Class: Diagnosis of   Dyspnea 05/18/2013   Pulmonary nodule 1 cm or greater in diameter 05/18/2013   Chest pain 04/09/2012   CAD (coronary artery disease)    Transient cerebral ischemia 08/08/2009   DM2 (diabetes mellitus, type 2) (HCC) 02/01/2009   Hyperlipidemia 02/01/2009   CLAUSTROPHOBIA 02/01/2009   Tobacco abuse 02/01/2009   Essential hypertension 02/01/2009   Coronary atherosclerosis 02/01/2009   PERIPHERAL VASCULAR DISEASE 02/01/2009   GASTROESOPHAGEAL REFLUX DISEASE 02/01/2009   PEPTIC ULCER DISEASE, HX OF 02/01/2009   PCP:  [name], MD Pharmacy:   [facility] [address] [place] - [address] [place] [address] Phone: [phone] Fax: [phone]     Social Determinants of Health (SDOH) Social History: SDOH Screenings   Food Insecurity: No Food Insecurity (07/13/2023)  Housing: Low Risk  (07/13/2023)  Transportation Needs: No Transportation Needs (07/13/2023)  Utilities: Not At Risk (07/13/2023)  Depression (PHQ2-9): Medium Risk (06/15/2023)  Tobacco Use: High Risk (07/12/2023)   SDOH Interventions:     Readmission Risk Interventions     No data to display

## 2023-07-14 NOTE — Care Management Obs Status (Signed)
MEDICARE OBSERVATION STATUS NOTIFICATION   Patient Details  Name: Lindsay Brooks MRN: [id number] Date of Birth: Apr 29, 1951   Medicare Observation Status Notification Given:       Howell Rucks, RN 07/14/2023, 10:15 AM

## 2023-07-14 NOTE — Progress Notes (Signed)
Patient had another episode of unresponsiveness while sitting on the toilet, after voiding and had bowel movement, RN and NT had to carry patient back to bed, patient daughter at bedside, after 3-5 minutes patient alert and wondering what just happened and patient able to follow commands. CN made aware and NP Garner Nash, notified.   07/14/23 0415  Vitals  BP 110/86  MAP (mmHg) 94  BP Location Right Arm  BP Method Automatic  Patient Position (if appropriate) Lying  Pulse Rate 76  Pulse Rate Source Monitor  ECG Heart Rate 85  Resp (!) 26  Level of Consciousness  Level of Consciousness Unresponsive  MEWS COLOR  MEWS Score Color Red  Oxygen Therapy  SpO2 100 %  O2 Device Nasal Cannula  O2 Flow Rate (L/min) 4 L/min  MEWS Score  MEWS Temp 0  MEWS Systolic 0  MEWS Pulse 0  MEWS RR 2  MEWS LOC 3  MEWS Score 5  Provider Notification  Provider Name/Title J.Garner Nash, NP  Date Provider Notified 07/14/23  Time Provider Notified [phone]  Method of Notification Page  Notification Reason Change in status  Provider response No new orders  Date of Provider Response 07/14/23  Time of Provider Response 0430

## 2023-07-15 DIAGNOSIS — Z23 Encounter for immunization: Secondary | ICD-10-CM | POA: Diagnosis not present

## 2023-07-15 DIAGNOSIS — E1122 Type 2 diabetes mellitus with diabetic chronic kidney disease: Secondary | ICD-10-CM | POA: Diagnosis present

## 2023-07-15 DIAGNOSIS — D638 Anemia in other chronic diseases classified elsewhere: Secondary | ICD-10-CM | POA: Diagnosis present

## 2023-07-15 DIAGNOSIS — K219 Gastro-esophageal reflux disease without esophagitis: Secondary | ICD-10-CM | POA: Diagnosis present

## 2023-07-15 DIAGNOSIS — I129 Hypertensive chronic kidney disease with stage 1 through stage 4 chronic kidney disease, or unspecified chronic kidney disease: Secondary | ICD-10-CM | POA: Diagnosis present

## 2023-07-15 DIAGNOSIS — J449 Chronic obstructive pulmonary disease, unspecified: Secondary | ICD-10-CM | POA: Diagnosis present

## 2023-07-15 DIAGNOSIS — F0394 Unspecified dementia, unspecified severity, with anxiety: Secondary | ICD-10-CM | POA: Diagnosis present

## 2023-07-15 DIAGNOSIS — E785 Hyperlipidemia, unspecified: Secondary | ICD-10-CM | POA: Diagnosis present

## 2023-07-15 DIAGNOSIS — C50412 Malignant neoplasm of upper-outer quadrant of left female breast: Secondary | ICD-10-CM | POA: Diagnosis present

## 2023-07-15 DIAGNOSIS — D508 Other iron deficiency anemias: Secondary | ICD-10-CM | POA: Diagnosis present

## 2023-07-15 DIAGNOSIS — K922 Gastrointestinal hemorrhage, unspecified: Secondary | ICD-10-CM | POA: Diagnosis present

## 2023-07-15 DIAGNOSIS — Z794 Long term (current) use of insulin: Secondary | ICD-10-CM | POA: Diagnosis not present

## 2023-07-15 DIAGNOSIS — I2510 Atherosclerotic heart disease of native coronary artery without angina pectoris: Secondary | ICD-10-CM | POA: Diagnosis present

## 2023-07-15 DIAGNOSIS — F17210 Nicotine dependence, cigarettes, uncomplicated: Secondary | ICD-10-CM | POA: Diagnosis present

## 2023-07-15 DIAGNOSIS — C3411 Malignant neoplasm of upper lobe, right bronchus or lung: Secondary | ICD-10-CM | POA: Diagnosis present

## 2023-07-15 DIAGNOSIS — R29818 Other symptoms and signs involving the nervous system: Secondary | ICD-10-CM | POA: Diagnosis present

## 2023-07-15 DIAGNOSIS — R569 Unspecified convulsions: Secondary | ICD-10-CM | POA: Diagnosis not present

## 2023-07-15 DIAGNOSIS — I951 Orthostatic hypotension: Secondary | ICD-10-CM | POA: Diagnosis not present

## 2023-07-15 DIAGNOSIS — E041 Nontoxic single thyroid nodule: Secondary | ICD-10-CM | POA: Diagnosis present

## 2023-07-15 DIAGNOSIS — M810 Age-related osteoporosis without current pathological fracture: Secondary | ICD-10-CM | POA: Diagnosis present

## 2023-07-15 DIAGNOSIS — E1151 Type 2 diabetes mellitus with diabetic peripheral angiopathy without gangrene: Secondary | ICD-10-CM | POA: Diagnosis present

## 2023-07-15 DIAGNOSIS — R55 Syncope and collapse: Secondary | ICD-10-CM | POA: Diagnosis present

## 2023-07-15 DIAGNOSIS — Z17421 Hormone receptor negative with human epidermal growth factor receptor 2 negative status: Secondary | ICD-10-CM | POA: Diagnosis not present

## 2023-07-15 DIAGNOSIS — E876 Hypokalemia: Secondary | ICD-10-CM | POA: Diagnosis present

## 2023-07-15 DIAGNOSIS — N182 Chronic kidney disease, stage 2 (mild): Secondary | ICD-10-CM | POA: Diagnosis present

## 2023-07-15 LAB — CBC WITH DIFFERENTIAL/PLATELET
Abs Immature Granulocytes: 0.02 10*3/uL (ref 0.00–0.07)
Basophils Absolute: 0 10*3/uL (ref 0.0–0.1)
Basophils Relative: 0 %
Eosinophils Absolute: 0.1 10*3/uL (ref 0.0–0.5)
Eosinophils Relative: 3 %
HCT: 25.4 % — ABNORMAL LOW (ref 36.0–46.0)
Hemoglobin: 7.7 g/dL — ABNORMAL LOW (ref 12.0–15.0)
Immature Granulocytes: 0 %
Lymphocytes Relative: 30 %
Lymphs Abs: 1.3 10*3/uL (ref 0.7–4.0)
MCH: 25.1 pg — ABNORMAL LOW (ref 26.0–34.0)
MCHC: 30.3 g/dL (ref 30.0–36.0)
MCV: 82.7 fL (ref 80.0–100.0)
Monocytes Absolute: 0.4 10*3/uL (ref 0.1–1.0)
Monocytes Relative: 8 %
Neutro Abs: 2.6 10*3/uL (ref 1.7–7.7)
Neutrophils Relative %: 59 %
Platelets: 154 10*3/uL (ref 150–400)
RBC: 3.07 MIL/uL — ABNORMAL LOW (ref 3.87–5.11)
RDW: 18.6 % — ABNORMAL HIGH (ref 11.5–15.5)
WBC: 4.5 10*3/uL (ref 4.0–10.5)
nRBC: 0 % (ref 0.0–0.2)

## 2023-07-15 LAB — COMPREHENSIVE METABOLIC PANEL
ALT: 7 U/L (ref 0–44)
AST: 13 U/L — ABNORMAL LOW (ref 15–41)
Albumin: 2.5 g/dL — ABNORMAL LOW (ref 3.5–5.0)
Alkaline Phosphatase: 50 U/L (ref 38–126)
Anion gap: 4 — ABNORMAL LOW (ref 5–15)
BUN: 18 mg/dL (ref 8–23)
CO2: 24 mmol/L (ref 22–32)
Calcium: 8.2 mg/dL — ABNORMAL LOW (ref 8.9–10.3)
Chloride: 112 mmol/L — ABNORMAL HIGH (ref 98–111)
Creatinine, Ser: 0.79 mg/dL (ref 0.44–1.00)
GFR, Estimated: >60 mL/min (ref >60)
Glucose, Bld: 109 mg/dL — ABNORMAL HIGH (ref 70–99)
Potassium: 3.7 mmol/L (ref 3.5–5.1)
Sodium: 140 mmol/L (ref 135–145)
Total Bilirubin: 0.4 mg/dL (ref <1.2)
Total Protein: 5.2 g/dL — ABNORMAL LOW (ref 6.5–8.1)

## 2023-07-15 MED ORDER — ALPRAZOLAM 0.25 MG PO TABS: 0.2500 mg | ORAL_TABLET | Freq: Two times a day (BID) | ORAL | 0 refills | Status: DC | PRN

## 2023-07-15 MED ORDER — JARDIANCE 25 MG PO TABS: 25.0000 mg | ORAL_TABLET | Freq: Every day | ORAL | 2 refills | Status: AC

## 2023-07-15 MED ORDER — DIVALPROEX SODIUM 250 MG PO DR TAB: 250.0000 mg | DELAYED_RELEASE_TABLET | Freq: Two times a day (BID) | ORAL | 0 refills | Status: AC

## 2023-07-15 MED ORDER — SODIUM CHLORIDE 0.9 % IV SOLN
150.0000 mg | Freq: Once | INTRAVENOUS | Status: AC
  Administered 2023-07-15: 150 mg via INTRAVENOUS
  Filled 2023-07-15: qty 7.5

## 2023-07-15 MED ORDER — BUSPIRONE HCL 7.5 MG PO TABS: 7.5000 mg | ORAL_TABLET | Freq: Every day | ORAL | 0 refills | Status: AC | PRN

## 2023-07-15 MED ORDER — ENOXAPARIN SODIUM 40 MG/0.4ML IJ SOSY: 40.0000 mg | PREFILLED_SYRINGE | INTRAMUSCULAR | Status: DC

## 2023-07-15 MED ORDER — CLOPIDOGREL BISULFATE 75 MG PO TABS: 75.0000 mg | ORAL_TABLET | Freq: Every day | ORAL | 0 refills | Status: AC

## 2023-07-15 NOTE — Plan of Care (Signed)
  Problem: Clinical Measurements: Goal: Ability to maintain clinical measurements within normal limits will improve Outcome: Progressing Goal: Will remain free from infection Outcome: Progressing Goal: Diagnostic test results will improve Outcome: Progressing   Problem: Activity: Goal: Risk for activity intolerance will decrease Outcome: Progressing   Problem: Nutrition: Goal: Adequate nutrition will be maintained Outcome: Progressing   Problem: Coping: Goal: Level of anxiety will decrease Outcome: Progressing   Problem: Safety: Goal: Ability to remain free from injury will improve Outcome: Progressing

## 2023-07-15 NOTE — Evaluation (Signed)
Physical Therapy Re-Evaluation Patient Details Name: Lindsay Brooks MRN: [id number] DOB: [birth year] Today's Date: 07/15/2023  History of Present Illness  Pt is a 72 y/o F admitted on 07/12/23 after presenting with c/o LOC episodes over the past 2 months, as well as LUE tingling associated with twisting of the tongue. MRI not showing any acute changes. Pt is being seen for PT re-evaluation per MD request. PMH: chronic respiratory failure on 2L O2, anemia, CAD, CKD, COPD, depression, mini stroke, PVD, psychiatric pseudoseizure, TIA, DM, essential HTN  Clinical Impression  Pt seen for PT re-evaluation per MD request. Pt received in bed with daughter present. Pt HOH but daughter adjusted pt's hearing aides with improvement. Prior to admission pt was living with daughter in a mobile home with ramped entry, pt reports using RW. Pt completes supine>sit with min assist (daughter elected to provide), STS with CGA, and ambulate in hallway with RW & CGA. Pt conversing with therapist & daughter during gait, reporting she felt good & wanted to continue walking then shortly after RUE began to shake & pt with LOC. Pt assisted to sitting in chair, pt regained consciousness, but then with another LOC. Pt assisted back to room & bed with regained consciousness. Pt reports feeling fatigue afterwards. PT educates pt & daughter on recommendation to use w/c for mobility to reduce fall risks 2/2 pt being unaware of when these episodes will occur. Will continue to follow pt acutely to address gait & balance as able.  BP in LUE 151/68 mmHg MAP 93 HR 82 bpm SpO2 96% on room air        If plan is discharge home, recommend the following: A little help with walking and/or transfers;A little help with bathing/dressing/bathroom;Assistance with cooking/housework;Assist for transportation;Help with stairs or ramp for entrance   Can travel by private vehicle        Equipment Recommendations None recommended by PT (pt already  has all needed DME)  Recommendations for Other Services       Functional Status Assessment Patient has had a recent decline in their functional status and demonstrates the ability to make significant improvements in function in a reasonable and predictable amount of time.     Precautions / Restrictions Precautions Precautions: Fall Precaution Comments: episodes of LOC Restrictions Weight Bearing Restrictions: No      Mobility  Bed Mobility Overal bed mobility: Needs Assistance Bed Mobility: Supine to Sit     Supine to sit: Min assist, Used rails, HOB elevated (daughter provided min assist for supine>sit)          Transfers Overall transfer level: Needs assistance Equipment used: Rolling walker (2 wheels) Transfers: Sit to/from Stand Sit to Stand: Supervision, Contact guard assist           General transfer comment: STS from EOB with RW    Ambulation/Gait Ambulation/Gait assistance: Contact guard assist Gait Distance (Feet): 60 Feet Assistive device: Rolling walker (2 wheels) Gait Pattern/deviations: Decreased stride length, Trunk flexed Gait velocity: slightly decreased        Stairs            Wheelchair Mobility     Tilt Bed    Modified Rankin (Stroke Patients Only)       Balance Overall balance assessment: History of Falls Sitting-balance support: Feet supported Sitting balance-Leahy Scale: Good     Standing balance support: During functional activity, Bilateral upper extremity supported, Reliant on assistive device for balance Standing balance-Leahy Scale: Fair  Pertinent Vitals/Pain Pain Assessment Pain Assessment: No/denies pain    Home Living Family/patient expects to be discharged to:: Private residence Living Arrangements: Children Available Help at Discharge: Family;Available 24 hours/day Type of Home: House Home Access: Ramped entrance       Home Layout: One level Home  Equipment: Cane - single Information systems manager (2 wheels);Wheelchair - manual;Shower seat - built in;Grab bars - tub/shower;Hand held shower head      Prior Function               Mobility Comments: ambulatory, pt reports she uses RW for gait       Extremity/Trunk Assessment   Upper Extremity Assessment Upper Extremity Assessment: Overall WFL for tasks assessed    Lower Extremity Assessment Lower Extremity Assessment: Overall WFL for tasks assessed       Communication   Communication Communication: Hearing impairment  Cognition Arousal: Alert Behavior During Therapy: WFL for tasks assessed/performed Overall Cognitive Status: Within Functional Limits for tasks assessed                                          General Comments      Exercises     Assessment/Plan    PT Assessment Patient needs continued PT services  PT Problem List Decreased strength;Decreased activity tolerance;Decreased balance;Decreased mobility;Decreased knowledge of use of DME       PT Treatment Interventions DME instruction;Balance training;Modalities;Gait training;Neuromuscular re-education;Functional mobility training;Therapeutic activities;Therapeutic exercise;Patient/family education    PT Goals (Current goals can be found in the Care Plan section)  Acute Rehab PT Goals Patient Stated Goal: go home PT Goal Formulation: With patient/family Time For Goal Achievement: 07/29/23 Potential to Achieve Goals: Fair    Frequency Min 1X/week     Co-evaluation               AM-PAC PT "6 Clicks" Mobility  Outcome Measure Help needed turning from your back to your side while in a flat bed without using bedrails?: None Help needed moving from lying on your back to sitting on the side of a flat bed without using bedrails?: A Little Help needed moving to and from a bed to a chair (including a wheelchair)?: A Little Help needed standing up from a chair using your  arms (e.g., wheelchair or bedside chair)?: A Little Help needed to walk in hospital room?: A Little Help needed climbing 3-5 steps with a railing? : A Little 6 Click Score: 19    End of Session   Activity Tolerance: Treatment limited secondary to medical complications (Comment) Patient left: in bed;with bed alarm set;with family/visitor present;with nursing/sitter in room Nurse Communication: Mobility status (vitals, episode of LOC during session) PT Visit Diagnosis: Other abnormalities of gait and mobility (R26.89);Unsteadiness on feet (R26.81);Muscle weakness (generalized) (M62.81)    Time: 2542-7062 PT Time Calculation (min) (ACUTE ONLY): 16 min   Charges:   PT Evaluation $PT Re-evaluation: 1 Re-eval   PT General Charges $$ ACUTE PT VISIT: 1 Visit         Aleda Grana, PT, DPT 07/15/23, 2:39 PM   Sandi Mariscal 07/15/2023, 2:36 PM

## 2023-07-15 NOTE — TOC Transition Note (Signed)
Transition of Care [facility]) - CM/SW Discharge Note   Patient Details  Name: Lindsay Brooks MRN: [id number] Date of Birth: 28-Oct-1950  [facility]) CM/SW Contact:  Howell Rucks, RN Phone Number: 07/15/2023, 3:56 PM   Clinical Narrative:  DC Home with HH PT through [facility], added to AVS. Daughter to transport home. No further TOC needs identified.     Final next level of care: Home w Home Health Services Barriers to Discharge: Barriers Resolved   Patient Goals and CMS Choice      Discharge Placement                         Discharge Plan and Services Additional resources added to the After Visit Summary for                              [facility] Agency: [facility] Date [facility] Agency Contacted: 07/15/23 Time HH Agency Contacted: 1555 Representative spoke with at [facility] Agency: [facility]  Social Determinants of Health (SDOH) Interventions SDOH Screenings   Food Insecurity: No Food Insecurity (07/13/2023)  Housing: Low Risk  (07/13/2023)  Transportation Needs: No Transportation Needs (07/13/2023)  Utilities: Not At Risk (07/13/2023)  Depression (PHQ2-9): Medium Risk (06/15/2023)  Tobacco Use: High Risk (07/12/2023)     Readmission Risk Interventions     No data to display

## 2023-07-15 NOTE — TOC Progression Note (Addendum)
Transition of Care [facility]) - Progression Note    Patient Details  Name: Lindsay Brooks MRN: [id number] Date of Birth: October 02, 1950  Transition of Care [facility]) CM/SW Contact  Howell Rucks, RN Phone Number: 07/15/2023, 3:19 PM  Clinical Narrative:  PT eval completed, recommendation for [facility] PT. Met with dtr at bedside, agreeable, no preference. NCM will assist in arranging [facility] PT.   -3:53pm Call to [facility], [facility], accepted for [facility] PT, added to AVS.      Expected Discharge Plan: Home w Home Health Services Barriers to Discharge: Continued Medical Work up  Expected Discharge Plan and Services       Living arrangements for the past 2 months: Single Family Home Expected Discharge Date: 07/15/23                                     Social Determinants of Health (SDOH) Interventions SDOH Screenings   Food Insecurity: No Food Insecurity (07/13/2023)  Housing: Low Risk  (07/13/2023)  Transportation Needs: No Transportation Needs (07/13/2023)  Utilities: Not At Risk (07/13/2023)  Depression (PHQ2-9): Medium Risk (06/15/2023)  Tobacco Use: High Risk (07/12/2023)    Readmission Risk Interventions     No data to display

## 2023-07-15 NOTE — Discharge Summary (Signed)
Physician Discharge Summary  KATA ORUM ZOX:[id number] DOB: 05-24-1951 DOA: 07/12/2023  PCP: [name], MD  Admit date: 07/12/2023 Discharge date: 07/15/2023  Time spent: 56 minutes  Recommendations for Outpatient Follow-up:  Please review all medications that the patient is on-patient was noncompliant apparently on BuSpar amlodipine Depakote Jardiance and may need a med planner and de-escalation of polypharmacy IV iron infusions may need to be set up either at cancer center in [place] or locally-CC Dr. Carles Collet not given at cancer center on visit with Dr. Gilman Buttner because of risk or failure of transfusion reaction for some reason and can be premedicated from my perspective for this if she has any issues here before she leaves with that infusion Does require iron TSAT levels in about 4 weeks, will check a Depakote level to ensure patient is adherent to meds Recommend Chem-12 CBC 1 week's time Recommend psychiatry referral as an outpatient to standardize meds-again has polypharmacy and was not probably taking all the meds she needs to-given a very low dose of Xanax twice daily given functional nature of her disorder Needs thyroid ultrasound/imaging 05/2024 Patient and family elected to use both Plavix and aspirin-I would consider de-escalating to only Plavix in the outpatient given history of nonconvulsive episodes where she passes out   Discharge Diagnoses:  MAIN problem for hospitalization \  NSCLC right upper lobe diagnosed 04/2022 Breast cancer left breast triple negative Nonconvulsive seizures with pseudoseizures and syncope Functional neurological disorder Anemia requiring IV iron Infarct-related dementia and underlying anxiety  Please see below for itemized issues addressed in [facility]- refer to other progress notes for clarity if needed  Discharge Condition: Guarded  Diet recommendation: Heart healthy, diabetic  Filed Weights   07/12/23 1657 07/13/23 2025  Weight:  53.5 kg 55.7 kg    History of present illness:  72 year old white female known history of stage Ib triple negative breast cancer poor tolerance docetaxel/cyclophosphamide completing chemo 07/2022/adjuvant XRT 2024--- diagnosed with stage I A3 T1 N0 M0 NSCLC RUL 25X 22 mm--- currently on radiation therapy with Dr. Tildon Husky Continued 1-1/2 pack/day smoker Thyroid nodule 12 mm Psychosomatic seizures TIA 2005 Peripheral vascular disease with L CFA left iliac stent 2007 Lower GI bleed 10/2022 aspirin Plavix resumed at discharge flex sig was normal she had large hemorrhoids  She appears to have had multiple nonconvulsive seizures at home and at oncology office 05/14/2023 She had a documented workup for pseudoseizures extensively in 2022 and these were functional presentations noted as recently by [place] 04/30/2023 with shaking spells lasting 30 to 40 seconds which were clearly none seizure-like  In either event she was transported from the cancer center 12/2 with episodic unresponsiveness and was slumped over in the car-neurology saw the patient worked her up and EEG as well as echocardiogram were performed See below  Hospital Course:  Pseudoseizures with syncope She was not orthostatic although she was mildly positive on arrival She was evaluated by neurology who did not feel any further workup is needed She remains at high risk for falls and although this happens frequently we ensured that physical therapy saw the patient-patient has a walker at home as well as a wheelchair and lives with daughter and daughter is aware of warning signs and how to manage-I have cautioned her that Plavix aspirin discussion should be performed in the outpatient setting with either PCP or neurology  TIA 2005 We initially discussed keeping her on either only aspirin or Plavix because of risk of falls-family wish to keep her on  both-I explained the risk of falls with bleeding and daughter's understanding of the risks of  the same I would recommend de-escalation off of aspirin to just Plavix 72 alone  CAD HTN DES 2005 Patient will continue hydralazine 50 twice daily as well as metoprolol 25 twice daily It does not appear she was taking amlodipine so I stopped that we also stopped the patient's Diovan HCT given risk of volume depletion She should be seen in the outpatient setting and compliance should be reinforced  Normocytic anemia prior hemorrhoidal bleeding Severe iron deficiency based on labs 05/14/2023 Patient was not given IV iron at recent oncology office visit for fear of side effect or reaction-here she was given IV iron and will need iron labs repeated in about 4 weeks  Dementia?  Anxiety Patient apparently was not taking BuSpar or Depakote as an outpatient These were resumed at discharge I started low-dose of Xanax this hospital stay and resumed her Aricept She should have an outpatient goals of care discussion  DM TY 2 Not taking long-acting insulin either so this was discontinued as her sugars are well-controlled she can continue Jardiance 25 alone  Medical noncompliance Unclear if she was actually taking meds as dictated above-PCP to check on compliance and med bottles to ensure no therapeutic duplication   Discharge Exam: Vitals:   07/15/23 0906 07/15/23 1208  BP: (!) 169/62 (!) 164/65  Pulse: 66 70  Resp:  18  Temp:  98 F (36.7 C)  SpO2: 100% 96%    Subj on day of d/c   Awake coherent had 1 or 2 episodes of "passing out spells" that she recovered from quickly-overall ambulatory no chest pain no fever  General Exam on discharge  EOMI NCAT no focal deficit moving 4 limbs equally quite hard of hearing CTAB no added sound wheeze rales rhonchi S1-S2 no murmur Abdomen soft no rebound or guarding ROM intact   Discharge Instructions   Discharge Instructions     Diet - low sodium heart healthy   Complete by: As directed    Discharge instructions   Complete by: As  directed    Your diagnosis hospital stay with nonconvulsive nonseizure episodes which may be related to functional causes--- to be clear you did not have any organic pathology and because of your episodes I would recommend you have a discussion with your primary physician about coming off of either Plavix or aspirin because the risk of you falling and hitting something and causing some bleeding may be high It was noted during hospitalization that you might have run out of several medications so we have refilled them for you-I have given you a small dose of Xanax for short period of time to see if this helps with some of the anxiety around your diagnoses Do not take Allegra unless you really need it  It was noted that you may not have been taking long-acting insulin or Lantus just continue the Jardiance for now  Make sure that you follow-up with your primary physician in the outpatient setting--do not take the amlodipine or Diovan for now instead take the other medications for blood pressure such as metoprolol and hydralazine  For high-grade fevers chills nausea vomiting or other symptoms call your physician or come back to the emergency room   Increase activity slowly   Complete by: As directed       Allergies as of 07/15/2023       Reactions   Amoxicillin Hives, Nausea And Vomiting  Claritin [loratadine] Other (See Comments)   Pt's daughter notified us of this allergy (reaction?)   Insulins Nausea And Vomiting, Other (See Comments)   Patient unsure of name of insulin   Vioxx [rofecoxib] Hives   Celecoxib Hives, Nausea And Vomiting   Liraglutide Nausea And Vomiting, Other (See Comments)   Victoza   Propranolol Diarrhea, Nausea And Vomiting        Medication List     STOP taking these medications    amLODipine 2.5 MG tablet Commonly known as: NORVASC   fexofenadine 180 MG tablet Commonly known as: ALLEGRA   Klor-Con M10 10 MEQ tablet Generic drug: potassium chloride    Lantus SoloStar 100 UNIT/ML Solostar Pen Generic drug: insulin glargine   valsartan-hydrochlorothiazide 320-25 MG tablet Commonly known as: DIOVAN-HCT       TAKE these medications    acetaminophen 500 MG tablet Commonly known as: TYLENOL Take 1,000 mg by mouth as needed for moderate pain.   ALPRAZolam 0.25 MG tablet Commonly known as: XANAX Take 1 tablet (0.25 mg total) by mouth 2 (two) times daily as needed for anxiety.   aspirin EC 81 MG tablet Take 1 tablet (81 mg total) by mouth daily.   busPIRone 7.5 MG tablet Commonly known as: BUSPAR Take 1 tablet (7.5 mg total) by mouth daily as needed (for anxiety).   clopidogrel 75 MG tablet Commonly known as: PLAVIX Take 1 tablet (75 mg total) by mouth daily.   Cymbalta 60 MG capsule Generic drug: DULoxetine Take 60 mg by mouth at bedtime.   divalproex 250 MG DR tablet Commonly known as: DEPAKOTE Take 1 tablet (250 mg total) by mouth 2 (two) times daily.   donepezil 5 MG tablet Commonly known as: ARICEPT Take 5 mg by mouth daily.   ezetimibe 10 MG tablet Commonly known as: ZETIA Take 10 mg by mouth daily.   famotidine 20 MG tablet Commonly known as: PEPCID Take 20 mg by mouth 2 (two) times daily.   ferrous sulfate 325 (65 FE) MG tablet Take 325 mg by mouth daily with breakfast.   hydrALAZINE 50 MG tablet Commonly known as: APRESOLINE Take 50 mg by mouth in the morning and at bedtime.   ipratropium-albuterol 0.5-2.5 (3) MG/3ML Soln Commonly known as: DUONEB Take 3 mLs by nebulization every 6 (six) hours as needed (for shortness of breath or wheezing).   Jardiance 25 MG Tabs tablet Generic drug: empagliflozin Take 1 tablet (25 mg total) by mouth daily.   metoprolol tartrate 25 MG tablet Commonly known as: LOPRESSOR Take 25 mg by mouth 2 (two) times daily.   nitroGLYCERIN 0.4 MG SL tablet Commonly known as: NITROSTAT Place 0.4 mg under the tongue every 5 (five) minutes as needed for chest pain.    Prolia 60 MG/ML Sosy injection Generic drug: denosumab Inject 60 mg into the skin every 6 (six) months.   zonisamide 100 MG capsule Commonly known as: ZONEGRAN Take 200 mg by mouth at bedtime.       Allergies  Allergen Reactions   Amoxicillin Hives and Nausea And Vomiting   Claritin [Loratadine] Other (See Comments)    Pt's daughter notified us of this allergy (reaction?)   Insulins Nausea And Vomiting and Other (See Comments)    Patient unsure of name of insulin   Vioxx [Rofecoxib] Hives   Celecoxib Hives and Nausea And Vomiting   Liraglutide Nausea And Vomiting and Other (See Comments)    Victoza   Propranolol Diarrhea and Nausea And Vomiting  The results of significant diagnostics from this hospitalization (including imaging, microbiology, ancillary and laboratory) are listed below for reference.    Significant Diagnostic Studies: CT Angio Chest Pulmonary Embolism (PE) W or WO Contrast  Result Date: 07/13/2023 CLINICAL DATA:  Elevated D-dimer level. EXAM: CT ANGIOGRAPHY CHEST WITH CONTRAST TECHNIQUE: Multidetector CT imaging of the chest was performed using the standard protocol during bolus administration of intravenous contrast. Multiplanar CT image reconstructions and MIPs were obtained to evaluate the vascular anatomy. RADIATION DOSE REDUCTION: This exam was performed according to the departmental dose-optimization program which includes automated exposure control, adjustment of the mA and/or kV according to patient size and/or use of iterative reconstruction technique. CONTRAST:  75mL OMNIPAQUE IOHEXOL 350 MG/ML SOLN COMPARISON:  CT scan of April 13, 2023. FINDINGS: Cardiovascular: Satisfactory opacification of the pulmonary arteries to the segmental level. No evidence of pulmonary embolism. Normal heart size. No pericardial effusion. Mediastinum/Nodes: No enlarged mediastinal, hilar, or axillary lymph nodes. Thyroid gland, trachea, and esophagus demonstrate no  significant findings. Lungs/Pleura: No pneumothorax or pleural effusion is noted. Mild biapical scarring is noted. There is continued subpleural nodularity and thickening anteriorly in the lingular segment of left upper lobe which may be increased compared to prior exam, suggesting increased inflammation. Stable semi solid nodular density seen centrally in right upper lobe as described on prior studies. Upper Abdomen: No acute abnormality. Musculoskeletal: Old left rib fractures are noted. No acute osseous abnormality seen. Review of the MIP images confirms the above findings. IMPRESSION: No definite evidence of pulmonary embolus. There is continued subpleural nodularity in thickening anteriorly in the lingular segment of left upper lobe which may be increased compared to prior exam, suggesting worsening inflammation. Grossly stable semi solid density seen centrally in right upper lobe. Adenocarcinoma cannot be excluded. Follow up by CT is recommended in 12 months, with continued annual surveillance for a minimum of 3 years. These recommendations are taken from: Recommendations for the Management of Subsolid Pulmonary Nodules Detected at CT: A Statement from the Fleischner Society Radiology 2013; 266:1, [phone]. Aortic Atherosclerosis (ICD10-I70.0). Electronically Signed   By: Lupita Raider M.D.   On: 07/13/2023 20:40   EEG adult  Result Date: 07/13/2023 Charlsie Quest, MD     07/13/2023  7:24 PM Patient Name: SHANIQUIA DOUGAL MRN: [id number] Epilepsy Attending: Charlsie Quest Referring Physician/Provider: Tegeler, Canary Brim, MD Date: 07/13/2023 Duration: 23.46 mins Patient history: 72 yo F with left hand numbness, tongue twisting up, and nonverbal state that resolved in 15 minutes.  While in CT patient patient had a shaking episode of bilateral shoulders for approximately 30 seconds and was given lorazepam with resolution of symptoms. EEG to evaluate for seizure Level of alertness: Awake, asleep AEDs  during EEG study: VPA Technical aspects: This EEG study was done with scalp electrodes positioned according to the 10-20 International system of electrode placement. Electrical activity was reviewed with band pass filter of 1-70Hz , sensitivity of 7 uV/mm, display speed of 66mm/sec with a 60Hz  notched filter applied as appropriate. EEG data were recorded continuously and digitally stored.  Video monitoring was available and reviewed as appropriate. Description: The posterior dominant rhythm consists of 9-10 Hz activity of moderate voltage (25-35 uV) seen predominantly in posterior head regions, symmetric and reactive to eye opening and eye closing. Sleep was characterized by vertex waves, sleep spindles (12 to 14 Hz), maximal frontocentral region. Physiologic photic driving was not seen during photic stimulation.  Hyperventilation was not performed.   IMPRESSION: This study  is within normal limits. No seizures or epileptiform discharges were seen throughout the recording. A normal interictal EEG does not exclude the diagnosis of epilepsy. Charlsie Quest   ECHOCARDIOGRAM COMPLETE  Result Date: 07/13/2023    ECHOCARDIOGRAM REPORT   Patient Name:   ZAMIA CAMMER Date of Exam: 07/13/2023 Medical Rec #:  [id number]        Height:       61.0 in Accession #:    [id number]       Weight:       118.0 lb Date of Birth:  1950-09-24         BSA:          1.509 m Patient Age:    72 years         BP:           157/65 mmHg Patient Gender: F                HR:           67 bpm. Exam Location:  Inpatient Procedure: 2D Echo, Cardiac Doppler and Color Doppler Indications:    Syncope  History:        Patient has prior history of Echocardiogram examinations, most                 recent 03/18/2022. CAD, COPD, Signs/Symptoms:Dyspnea and Chest                 Pain; Risk Factors:Diabetes, Dyslipidemia and Hypertension.  Sonographer:    Vern Claude Referring Phys: [id number] HERSH GOEL IMPRESSIONS  1. Left ventricular ejection fraction, by  estimation, is 70 to 75%. The left ventricle has hyperdynamic function. The left ventricle has no regional wall motion abnormalities. Left ventricular diastolic parameters are consistent with Grade I diastolic dysfunction (impaired relaxation).  2. Right ventricular systolic function is normal. The right ventricular size is normal. There is normal pulmonary artery systolic pressure.  3. The mitral valve is grossly normal. No evidence of mitral valve regurgitation. No evidence of mitral stenosis.  4. The aortic valve was not well visualized. Aortic valve regurgitation is not visualized. No aortic stenosis is present.  5. The inferior vena cava is normal in size with greater than 50% respiratory variability, suggesting right atrial pressure of 3 mmHg. Comparison(s): No prior Echocardiogram. FINDINGS  Left Ventricle: Left ventricular ejection fraction, by estimation, is 70 to 75%. The left ventricle has hyperdynamic function. The left ventricle has no regional wall motion abnormalities. The left ventricular internal cavity size was normal in size. There is no left ventricular hypertrophy. Left ventricular diastolic parameters are consistent with Grade I diastolic dysfunction (impaired relaxation). Right Ventricle: The right ventricular size is normal. No increase in right ventricular wall thickness. Right ventricular systolic function is normal. There is normal pulmonary artery systolic pressure. The tricuspid regurgitant velocity is 1.21 m/s, and  with an assumed right atrial pressure of 3 mmHg, the estimated right ventricular systolic pressure is 8.9 mmHg. Left Atrium: Left atrial size was normal in size. Right Atrium: Right atrial size was normal in size. Pericardium: There is no evidence of pericardial effusion. Mitral Valve: The mitral valve is grossly normal. No evidence of mitral valve regurgitation. No evidence of mitral valve stenosis. MV peak gradient, 4.2 mmHg. The mean mitral valve gradient is 2.0 mmHg.  Tricuspid Valve: The tricuspid valve is normal in structure. Tricuspid valve regurgitation is not demonstrated. No evidence of tricuspid stenosis. Aortic Valve: The aortic valve was not  well visualized. Aortic valve regurgitation is not visualized. No aortic stenosis is present. Aortic valve mean gradient measures 3.0 mmHg. Aortic valve peak gradient measures 6.5 mmHg. Aortic valve area, by VTI measures 3.41 cm. Pulmonic Valve: The pulmonic valve was not well visualized. Pulmonic valve regurgitation is not visualized. No evidence of pulmonic stenosis. Aorta: The aortic root, ascending aorta and aortic arch are all structurally normal, with no evidence of dilitation or obstruction. Venous: The inferior vena cava is normal in size with greater than 50% respiratory variability, suggesting right atrial pressure of 3 mmHg. IAS/Shunts: The atrial septum is grossly normal.  LEFT VENTRICLE PLAX 2D LVIDd:         4.40 cm      Diastology LVIDs:         2.90 cm      LV e' medial:    6.09 cm/s LV PW:         0.60 cm      LV E/e' medial:  12.0 LV IVS:        0.70 cm      LV e' lateral:   9.57 cm/s LVOT diam:     1.90 cm      LV E/e' lateral: 7.6 LV SV:         92 LV SV Index:   61 LVOT Area:     2.84 cm  LV Volumes (MOD) LV vol d, MOD A2C: 121.0 ml LV vol d, MOD A4C: 109.0 ml LV vol s, MOD A2C: 43.7 ml LV vol s, MOD A4C: 25.1 ml LV SV MOD A2C:     77.3 ml LV SV MOD A4C:     109.0 ml LV SV MOD BP:      82.8 ml RIGHT VENTRICLE             IVC RV Basal diam:  2.80 cm     IVC diam: 1.00 cm RV Mid diam:    1.60 cm RV S prime:     13.70 cm/s TAPSE (M-mode): 3.4 cm LEFT ATRIUM             Index        RIGHT ATRIUM          Index LA diam:        3.20 cm 2.12 cm/m   RA Area:     6.32 cm LA Vol (A2C):   40.9 ml 27.10 ml/m  RA Volume:   8.72 ml  5.78 ml/m LA Vol (A4C):   27.8 ml 18.42 ml/m LA Biplane Vol: 33.7 ml 22.33 ml/m  AORTIC VALVE                    PULMONIC VALVE AV Area (Vmax):    3.24 cm     PV Vmax:       0.78 m/s AV  Area (Vmean):   3.30 cm     PV Peak grad:  2.4 mmHg AV Area (VTI):     3.41 cm AV Vmax:           127.00 cm/s AV Vmean:          75.200 cm/s AV VTI:            0.271 m AV Peak Grad:      6.5 mmHg AV Mean Grad:      3.0 mmHg LVOT Vmax:         145.00 cm/s LVOT Vmean:        87.400 cm/s  LVOT VTI:          0.326 m LVOT/AV VTI ratio: 1.20  AORTA Ao Root diam: 2.90 cm Ao Asc diam:  2.70 cm MITRAL VALVE               TRICUSPID VALVE MV Area (PHT): 2.17 cm    TR Peak grad:   5.9 mmHg MV Area VTI:   4.75 cm    TR Vmax:        121.00 cm/s MV Peak grad:  4.2 mmHg MV Mean grad:  2.0 mmHg    SHUNTS MV Vmax:       1.03 m/s    Systemic VTI:  0.33 m MV Vmean:      60.7 cm/s   Systemic Diam: 1.90 cm MV Decel Time: 349 msec MV E velocity: 73.00 cm/s MV A velocity: 78.50 cm/s MV E/A ratio:  0.93 Riley Lam MD Electronically signed by Riley Lam MD Signature Date/Time: 07/13/2023/12:05:38 PM    Final    MR BRAIN WO CONTRAST  Result Date: 07/12/2023 CLINICAL DATA:  Nonverbal, right hand numbness, seizure like activity EXAM: MRI HEAD WITHOUT CONTRAST TECHNIQUE: Multiplanar, multiecho pulse sequences of the brain and surrounding structures were obtained without intravenous contrast. COMPARISON:  12/24/2018 MRI head, correlation is also made with 07/12/2023 CT head FINDINGS: Brain: No restricted diffusion to suggest acute or subacute infarct. No acute hemorrhage, mass, mass effect, or midline shift. No hydrocephalus or extra-axial collection. Pituitary and craniocervical junction within normal limits. No hemosiderin deposition to suggest remote hemorrhage. Scattered T2 hyperintense signal in the periventricular white matter and pons, likely the sequela of chronic small vessel ischemic disease. Vascular: Normal arterial flow voids. Skull and upper cervical spine: Normal marrow signal. Sinuses/Orbits: Mucosal thickening and air-fluid level in the right maxillary sinus. Mucosal thickening in the ethmoid air cells  and frontal sinuses. No acute finding in the orbits. Status post bilateral lens replacements. Other: Fluid in the right-greater-than-left mastoid air cells. IMPRESSION: 1. No acute intracranial process. No evidence of acute or subacute infarct. 2. Mucosal thickening and air-fluid level in the right maxillary sinus, which can be seen in the setting of acute sinusitis. Electronically Signed   By: Wiliam Ke M.D.   On: 07/12/2023 19:23   CT ANGIO HEAD NECK W WO CM W PERF  Result Date: 07/12/2023 CLINICAL DATA:  Neuro deficit, acute, stroke suspected. Right hand numbness. Altered mental status. History of lung cancer. EXAM: CT ANGIOGRAPHY HEAD AND NECK CT PERFUSION BRAIN TECHNIQUE: Multidetector CT imaging of the head and neck was performed using the standard protocol during bolus administration of intravenous contrast. Multiplanar CT image reconstructions and MIPs were obtained to evaluate the vascular anatomy. Carotid stenosis measurements (when applicable) are obtained utilizing NASCET criteria, using the distal internal carotid diameter as the denominator. Multiphase CT imaging of the brain was performed following IV bolus contrast injection. Subsequent parametric perfusion maps were calculated using RAPID software. RADIATION DOSE REDUCTION: This exam was performed according to the departmental dose-optimization program which includes automated exposure control, adjustment of the mA and/or kV according to patient size and/or use of iterative reconstruction technique. CONTRAST:  OMNIPAQUE IOHEXOL 350 MG/ML SOLN COMPARISON:  CTA head and neck 06/08/2021 FINDINGS: CTA NECK FINDINGS Aortic arch: Standard 3 vessel aortic arch with mild-to-moderate calcified and soft plaque. No significant stenosis of the arch vessel origins. Right carotid system: Patent without evidence of stenosis or dissection. Partially retropharyngeal course of the proximal ICA. Left carotid system: Patent without evidence  of a  significant common carotid or internal carotid artery stenosis or dissection. Moderate stenosis of the proximal ECA. Vertebral arteries: Patent without evidence a significant stenosis or dissection. Dominant left vertebral artery. Skeleton: Mild cervical spondylosis. Other neck: No evidence of cervical lymphadenopathy or mass. Upper chest: Mild scarring and peribronchial thickening in the upper lobes. Review of the MIP images confirms the above findings CTA HEAD FINDINGS Anterior circulation: The internal carotid arteries are patent from skull base to carotid termini with mild atherosclerosis resulting in no more than mild right cavernous stenosis, unchanged. ACAs and MCAs are patent without evidence of a proximal branch occlusion or significant proximal stenosis. No aneurysm is identified. Posterior circulation: The intracranial vertebral arteries are widely patent to the basilar. Patent PICA and SCA origins are visualized bilaterally. The basilar artery is widely patent. Posterior communicating arteries are diminutive or absent. Both PCAs are patent without evidence of a significant proximal stenosis. No aneurysm is identified. Venous sinuses: Patent. Anatomic variants: None. Review of the MIP images confirms the above findings CT Brain Perfusion Findings: The study is motion degraded. ASPECTS: 10 CBF (<30%) Volume: 0 mL Perfusion (Tmax>6.0s) volume: 9 mL, located in the anteroinferior right frontal lobe and considered to be artifactual IMPRESSION: 1. No large vessel occlusion or flow limiting proximal intracranial stenosis. 2. Cervical carotid atherosclerosis without significant common carotid or internal carotid artery stenosis. 3. No evidence of core infarct or penumbra on CTP. 4.  Aortic Atherosclerosis (ICD10-I70.0). Electronically Signed   By: Sebastian Ache M.D.   On: 07/12/2023 16:50   CT HEAD CODE STROKE WO CONTRAST  Result Date: 07/12/2023 CLINICAL DATA:  Code stroke.  Right hand numbness EXAM: CT HEAD  WITHOUT CONTRAST TECHNIQUE: Contiguous axial images were obtained from the base of the skull through the vertex without intravenous contrast. RADIATION DOSE REDUCTION: This exam was performed according to the departmental dose-optimization program which includes automated exposure control, adjustment of the mA and/or kV according to patient size and/or use of iterative reconstruction technique. COMPARISON:  CT head 05/14/23 FINDINGS: Brain: No hemorrhage. No hydrocephalus. No extra-axial fluid collection. No mass effect. No mass lesion. Possible age indeterminate infarct in the right cerebellum (series 2, image 12). There is a background of mild chronic microvascular ischemic change. Vascular: No hyperdense vessel or unexpected calcification. Skull: Normal. Negative for fracture or focal lesion. Sinuses/Orbits: No acute finding. Other: None. ASPECTS [facility] Stroke Program Early CT Score): 10 IMPRESSION: No hemorrhage. Possible age indeterminate infarct in the medial right cerebellum. Electronically Signed   By: Lorenza Cambridge M.D.   On: 07/12/2023 14:58    Microbiology: No results found for this or any previous visit (from the past 240 hour(s)).   Labs: Basic Metabolic Panel: Recent Labs  Lab 07/12/23 1439 07/12/23 1450 07/12/23 2048 07/13/23 0545 07/14/23 0403 07/15/23 0425  NA 139 142  --  142 139 140  K 4.1 4.4  --  3.3* 4.0 3.7  CL 109 110  --  113* 111 112*  CO2 23  --   --  20* 21* 24  GLUCOSE 125* 120*  --  79 76 109*  BUN 40* 35*  --  30* 22 18  CREATININE 1.28* 1.40* 1.10* 0.97 0.92 0.79  CALCIUM 9.0  --   --  8.0* 8.1* 8.2*   Liver Function Tests: Recent Labs  Lab 07/12/23 1439 07/15/23 0425  AST 23 13*  ALT 12 7  ALKPHOS 71 50  BILITOT 0.4 0.4  PROT 6.9 5.2*  ALBUMIN  3.6 2.5*   No results for input(s): "LIPASE", "AMYLASE" in the last 168 hours. No results for input(s): "AMMONIA" in the last 168 hours. CBC: Recent Labs  Lab 07/12/23 1439 07/12/23 1450 07/12/23 2048  07/13/23 0545 07/14/23 0403 07/15/23 0425  WBC 7.9  --  4.9 4.5 4.4 4.5  NEUTROABS 4.5  --   --   --  2.5 2.6  HGB 9.8* 11.6* 8.5* 8.0* 7.8* 7.7*  HCT 33.6* 34.0* 28.8* 27.2* 27.4* 25.4*  MCV 84.4  --  83.5 84.7 87.0 82.7  PLT 307  --  232 205 174 154   Cardiac Enzymes: No results for input(s): "CKTOTAL", "CKMB", "CKMBINDEX", "TROPONINI" in the last 168 hours. BNP: BNP (last 3 results) No results for input(s): "BNP" in the last 8760 hours.  ProBNP (last 3 results) No results for input(s): "PROBNP" in the last 8760 hours.  CBG: Recent Labs  Lab 07/13/23 2132 07/14/23 0132 07/14/23 0826 07/14/23 1149 07/14/23 1625  GLUCAP 109* 136* 77 123* 114*       Signed:  Rhetta Mura MD   [facility] 07/15/2023, 3:04 PM

## 2023-07-16 ENCOUNTER — Ambulatory Visit: Payer: Medicare Other | Admitting: Radiation Oncology

## 2023-07-16 ENCOUNTER — Other Ambulatory Visit: Payer: Self-pay | Admitting: Pharmacist

## 2023-07-16 ENCOUNTER — Ambulatory Visit: Payer: Medicare Other

## 2023-07-16 ENCOUNTER — Telehealth: Payer: Self-pay | Admitting: Oncology

## 2023-07-16 DIAGNOSIS — G4700 Insomnia, unspecified: Secondary | ICD-10-CM | POA: Diagnosis not present

## 2023-07-16 DIAGNOSIS — I2510 Atherosclerotic heart disease of native coronary artery without angina pectoris: Secondary | ICD-10-CM | POA: Diagnosis not present

## 2023-07-16 DIAGNOSIS — N189 Chronic kidney disease, unspecified: Secondary | ICD-10-CM | POA: Diagnosis not present

## 2023-07-16 DIAGNOSIS — J9610 Chronic respiratory failure, unspecified whether with hypoxia or hypercapnia: Secondary | ICD-10-CM | POA: Diagnosis not present

## 2023-07-16 DIAGNOSIS — K219 Gastro-esophageal reflux disease without esophagitis: Secondary | ICD-10-CM | POA: Diagnosis not present

## 2023-07-16 DIAGNOSIS — I69318 Other symptoms and signs involving cognitive functions following cerebral infarction: Secondary | ICD-10-CM | POA: Diagnosis not present

## 2023-07-16 DIAGNOSIS — T451X5D Adverse effect of antineoplastic and immunosuppressive drugs, subsequent encounter: Secondary | ICD-10-CM | POA: Diagnosis not present

## 2023-07-16 DIAGNOSIS — E7800 Pure hypercholesterolemia, unspecified: Secondary | ICD-10-CM | POA: Diagnosis not present

## 2023-07-16 DIAGNOSIS — E876 Hypokalemia: Secondary | ICD-10-CM | POA: Diagnosis not present

## 2023-07-16 DIAGNOSIS — M810 Age-related osteoporosis without current pathological fracture: Secondary | ICD-10-CM | POA: Diagnosis not present

## 2023-07-16 DIAGNOSIS — I700 Atherosclerosis of aorta: Secondary | ICD-10-CM | POA: Diagnosis not present

## 2023-07-16 DIAGNOSIS — I69354 Hemiplegia and hemiparesis following cerebral infarction affecting left non-dominant side: Secondary | ICD-10-CM | POA: Diagnosis not present

## 2023-07-16 DIAGNOSIS — E1122 Type 2 diabetes mellitus with diabetic chronic kidney disease: Secondary | ICD-10-CM | POA: Diagnosis not present

## 2023-07-16 DIAGNOSIS — D509 Iron deficiency anemia, unspecified: Secondary | ICD-10-CM | POA: Diagnosis not present

## 2023-07-16 DIAGNOSIS — J4489 Other specified chronic obstructive pulmonary disease: Secondary | ICD-10-CM | POA: Diagnosis not present

## 2023-07-16 DIAGNOSIS — I1310 Hypertensive heart and chronic kidney disease without heart failure, with stage 1 through stage 4 chronic kidney disease, or unspecified chronic kidney disease: Secondary | ICD-10-CM | POA: Diagnosis not present

## 2023-07-16 DIAGNOSIS — G620 Drug-induced polyneuropathy: Secondary | ICD-10-CM | POA: Diagnosis not present

## 2023-07-16 DIAGNOSIS — E1151 Type 2 diabetes mellitus with diabetic peripheral angiopathy without gangrene: Secondary | ICD-10-CM | POA: Diagnosis not present

## 2023-07-16 DIAGNOSIS — E1165 Type 2 diabetes mellitus with hyperglycemia: Secondary | ICD-10-CM | POA: Diagnosis not present

## 2023-07-16 DIAGNOSIS — I69328 Other speech and language deficits following cerebral infarction: Secondary | ICD-10-CM | POA: Diagnosis not present

## 2023-07-16 NOTE — Telephone Encounter (Signed)
07/16/23 Spoke with patients daughter and scheduled next appt.

## 2023-07-19 ENCOUNTER — Ambulatory Visit: Payer: Medicare Other | Admitting: Radiation Oncology

## 2023-07-19 ENCOUNTER — Ambulatory Visit
Admission: RE | Admit: 2023-07-19 | Discharge: 2023-07-19 | Disposition: A | Discharge status: Home / Self Care | Payer: Medicare Other | Source: Ambulatory Visit | Attending: Radiation Oncology | Admitting: Radiation Oncology

## 2023-07-19 ENCOUNTER — Other Ambulatory Visit: Payer: Self-pay

## 2023-07-19 DIAGNOSIS — Z6822 Body mass index (BMI) 22.0-22.9, adult: Secondary | ICD-10-CM | POA: Diagnosis not present

## 2023-07-19 DIAGNOSIS — C3411 Malignant neoplasm of upper lobe, right bronchus or lung: Secondary | ICD-10-CM | POA: Insufficient documentation

## 2023-07-19 DIAGNOSIS — C50412 Malignant neoplasm of upper-outer quadrant of left female breast: Secondary | ICD-10-CM | POA: Diagnosis not present

## 2023-07-19 DIAGNOSIS — C3490 Malignant neoplasm of unspecified part of unspecified bronchus or lung: Secondary | ICD-10-CM | POA: Diagnosis not present

## 2023-07-19 DIAGNOSIS — Z7689 Persons encountering health services in other specified circumstances: Secondary | ICD-10-CM | POA: Diagnosis not present

## 2023-07-19 DIAGNOSIS — Z510 Encounter for antineoplastic radiation therapy: Secondary | ICD-10-CM | POA: Diagnosis not present

## 2023-07-19 DIAGNOSIS — R531 Weakness: Secondary | ICD-10-CM | POA: Diagnosis not present

## 2023-07-19 DIAGNOSIS — E876 Hypokalemia: Secondary | ICD-10-CM | POA: Diagnosis not present

## 2023-07-19 DIAGNOSIS — D649 Anemia, unspecified: Secondary | ICD-10-CM | POA: Diagnosis not present

## 2023-07-19 DIAGNOSIS — C7800 Secondary malignant neoplasm of unspecified lung: Secondary | ICD-10-CM | POA: Diagnosis not present

## 2023-07-19 DIAGNOSIS — R569 Unspecified convulsions: Secondary | ICD-10-CM | POA: Diagnosis not present

## 2023-07-19 LAB — RAD ONC ARIA SESSION SUMMARY
Course Elapsed Days: 5
Plan Fractions Treated to Date: 2
Plan Prescribed Dose Per Fraction: 12 Gy
Plan Total Fractions Prescribed: 5
Plan Total Prescribed Dose: 60 Gy
Reference Point Dosage Given to Date: 24 Gy
Reference Point Session Dosage Given: 12 Gy
Session Number: 2

## 2023-07-20 ENCOUNTER — Ambulatory Visit: Payer: Medicare Other | Admitting: Radiation Oncology

## 2023-07-20 ENCOUNTER — Inpatient Hospital Stay: Payer: Medicare Other | Attending: Hematology and Oncology

## 2023-07-20 VITALS — BP 113/60 | HR 69 | Temp 97.7°F | Resp 16

## 2023-07-20 DIAGNOSIS — D509 Iron deficiency anemia, unspecified: Secondary | ICD-10-CM | POA: Insufficient documentation

## 2023-07-20 DIAGNOSIS — Z79899 Other long term (current) drug therapy: Secondary | ICD-10-CM | POA: Insufficient documentation

## 2023-07-20 DIAGNOSIS — F17210 Nicotine dependence, cigarettes, uncomplicated: Secondary | ICD-10-CM | POA: Insufficient documentation

## 2023-07-20 DIAGNOSIS — C3411 Malignant neoplasm of upper lobe, right bronchus or lung: Secondary | ICD-10-CM | POA: Insufficient documentation

## 2023-07-20 DIAGNOSIS — Z853 Personal history of malignant neoplasm of breast: Secondary | ICD-10-CM | POA: Insufficient documentation

## 2023-07-20 DIAGNOSIS — Z7982 Long term (current) use of aspirin: Secondary | ICD-10-CM | POA: Diagnosis not present

## 2023-07-20 MED ORDER — SODIUM CHLORIDE 0.9% FLUSH: 10.0000 mL | Freq: Two times a day (BID) | INTRAVENOUS | Status: DC

## 2023-07-20 MED ORDER — IRON SUCROSE 20 MG/ML IV SOLN
200.0000 mg | Freq: Once | INTRAVENOUS | Status: AC
  Administered 2023-07-20: 200 mg via INTRAVENOUS
  Filled 2023-07-20: qty 10

## 2023-07-20 MED ORDER — SODIUM CHLORIDE 0.9 % IV SOLN: INTRAVENOUS | Status: DC

## 2023-07-20 NOTE — Patient Instructions (Signed)
Iron Sucrose Injection What is this medication? IRON SUCROSE (EYE ern SOO krose) treats low levels of iron (iron deficiency anemia) in people with kidney disease. Iron is a mineral that plays an important role in making red blood cells, which carry oxygen from your lungs to the rest of your body. This medicine may be used for other purposes; ask your health care provider or pharmacist if you have questions. COMMON BRAND NAME(S): Venofer What should I tell my care team before I take this medication? They need to know if you have any of these conditions: Anemia not caused by low iron levels Heart disease High levels of iron in the blood Kidney disease Liver disease An unusual or allergic reaction to iron, other medications, foods, dyes, or preservatives Pregnant or trying to get pregnant Breastfeeding How should I use this medication? This medication is for infusion into a vein. It is given in a hospital or clinic setting. Talk to your care team about the use of this medication in children. While this medication may be prescribed for children as young as 2 years for selected conditions, precautions do apply. Overdosage: If you think you have taken too much of this medicine contact a poison control center or emergency room at once. NOTE: This medicine is only for you. Do not share this medicine with others. What if I miss a dose? Keep appointments for follow-up doses. It is important not to miss your dose. Call your care team if you are unable to keep an appointment. What may interact with this medication? Do not take this medication with any of the following: Deferoxamine Dimercaprol Other iron products This medication may also interact with the following: Chloramphenicol Deferasirox This list may not describe all possible interactions. Give your health care provider a list of all the medicines, herbs, non-prescription drugs, or dietary supplements you use. Also tell them if you smoke,  drink alcohol, or use illegal drugs. Some items may interact with your medicine. What should I watch for while using this medication? Visit your care team regularly. Tell your care team if your symptoms do not start to get better or if they get worse. You may need blood work done while you are taking this medication. You may need to follow a special diet. Talk to your care team. Foods that contain iron include: whole grains/cereals, dried fruits, beans, or peas, leafy green vegetables, and organ meats (liver, kidney). What side effects may I notice from receiving this medication? Side effects that you should report to your care team as soon as possible: Allergic reactions--skin rash, itching, hives, swelling of the face, lips, tongue, or throat Low blood pressure--dizziness, feeling faint or lightheaded, blurry vision Shortness of breath Side effects that usually do not require medical attention (report to your care team if they continue or are bothersome): Flushing Headache Joint pain Muscle pain Nausea Pain, redness, or irritation at injection site This list may not describe all possible side effects. Call your doctor for medical advice about side effects. You may report side effects to FDA at 1-800-FDA-1088. Where should I keep my medication? This medication is given in a hospital or clinic. It will not be stored at home. NOTE: This sheet is a summary. It may not cover all possible information. If you have questions about this medicine, talk to your doctor, pharmacist, or health care provider.  2024 Elsevier/Gold Standard (2023-01-01 00:00:00)

## 2023-07-21 ENCOUNTER — Other Ambulatory Visit: Payer: Self-pay

## 2023-07-21 ENCOUNTER — Ambulatory Visit
Admission: RE | Admit: 2023-07-21 | Discharge: 2023-07-21 | Disposition: A | Discharge status: Home / Self Care | Payer: Medicare Other | Source: Ambulatory Visit | Attending: Radiation Oncology | Admitting: Radiation Oncology

## 2023-07-21 ENCOUNTER — Ambulatory Visit: Payer: Medicare Other | Admitting: Radiation Oncology

## 2023-07-21 DIAGNOSIS — N189 Chronic kidney disease, unspecified: Secondary | ICD-10-CM | POA: Diagnosis not present

## 2023-07-21 DIAGNOSIS — I1310 Hypertensive heart and chronic kidney disease without heart failure, with stage 1 through stage 4 chronic kidney disease, or unspecified chronic kidney disease: Secondary | ICD-10-CM | POA: Diagnosis not present

## 2023-07-21 DIAGNOSIS — D509 Iron deficiency anemia, unspecified: Secondary | ICD-10-CM | POA: Diagnosis not present

## 2023-07-21 DIAGNOSIS — T451X5D Adverse effect of antineoplastic and immunosuppressive drugs, subsequent encounter: Secondary | ICD-10-CM | POA: Diagnosis not present

## 2023-07-21 DIAGNOSIS — E7800 Pure hypercholesterolemia, unspecified: Secondary | ICD-10-CM | POA: Diagnosis not present

## 2023-07-21 DIAGNOSIS — E1165 Type 2 diabetes mellitus with hyperglycemia: Secondary | ICD-10-CM | POA: Diagnosis not present

## 2023-07-21 DIAGNOSIS — J4489 Other specified chronic obstructive pulmonary disease: Secondary | ICD-10-CM | POA: Diagnosis not present

## 2023-07-21 DIAGNOSIS — J9610 Chronic respiratory failure, unspecified whether with hypoxia or hypercapnia: Secondary | ICD-10-CM | POA: Diagnosis not present

## 2023-07-21 DIAGNOSIS — Z510 Encounter for antineoplastic radiation therapy: Secondary | ICD-10-CM | POA: Diagnosis not present

## 2023-07-21 DIAGNOSIS — G4700 Insomnia, unspecified: Secondary | ICD-10-CM | POA: Diagnosis not present

## 2023-07-21 DIAGNOSIS — E1151 Type 2 diabetes mellitus with diabetic peripheral angiopathy without gangrene: Secondary | ICD-10-CM | POA: Diagnosis not present

## 2023-07-21 DIAGNOSIS — E876 Hypokalemia: Secondary | ICD-10-CM | POA: Diagnosis not present

## 2023-07-21 DIAGNOSIS — C3411 Malignant neoplasm of upper lobe, right bronchus or lung: Secondary | ICD-10-CM | POA: Diagnosis not present

## 2023-07-21 DIAGNOSIS — K219 Gastro-esophageal reflux disease without esophagitis: Secondary | ICD-10-CM | POA: Diagnosis not present

## 2023-07-21 DIAGNOSIS — G620 Drug-induced polyneuropathy: Secondary | ICD-10-CM | POA: Diagnosis not present

## 2023-07-21 DIAGNOSIS — I69318 Other symptoms and signs involving cognitive functions following cerebral infarction: Secondary | ICD-10-CM | POA: Diagnosis not present

## 2023-07-21 DIAGNOSIS — I2510 Atherosclerotic heart disease of native coronary artery without angina pectoris: Secondary | ICD-10-CM | POA: Diagnosis not present

## 2023-07-21 DIAGNOSIS — I69328 Other speech and language deficits following cerebral infarction: Secondary | ICD-10-CM | POA: Diagnosis not present

## 2023-07-21 DIAGNOSIS — I69354 Hemiplegia and hemiparesis following cerebral infarction affecting left non-dominant side: Secondary | ICD-10-CM | POA: Diagnosis not present

## 2023-07-21 DIAGNOSIS — E1122 Type 2 diabetes mellitus with diabetic chronic kidney disease: Secondary | ICD-10-CM | POA: Diagnosis not present

## 2023-07-21 DIAGNOSIS — M810 Age-related osteoporosis without current pathological fracture: Secondary | ICD-10-CM | POA: Diagnosis not present

## 2023-07-21 DIAGNOSIS — I700 Atherosclerosis of aorta: Secondary | ICD-10-CM | POA: Diagnosis not present

## 2023-07-21 LAB — RAD ONC ARIA SESSION SUMMARY
Course Elapsed Days: 7
Plan Fractions Treated to Date: 3
Plan Prescribed Dose Per Fraction: 12 Gy
Plan Total Fractions Prescribed: 5
Plan Total Prescribed Dose: 60 Gy
Reference Point Dosage Given to Date: 36 Gy
Reference Point Session Dosage Given: 12 Gy
Session Number: 3

## 2023-07-22 ENCOUNTER — Inpatient Hospital Stay: Payer: Medicare Other

## 2023-07-22 ENCOUNTER — Ambulatory Visit: Payer: Medicare Other | Admitting: Radiation Oncology

## 2023-07-22 VITALS — BP 130/52 | HR 79 | Temp 98.0°F | Resp 16

## 2023-07-22 DIAGNOSIS — F17210 Nicotine dependence, cigarettes, uncomplicated: Secondary | ICD-10-CM | POA: Diagnosis not present

## 2023-07-22 DIAGNOSIS — R0603 Acute respiratory distress: Secondary | ICD-10-CM | POA: Diagnosis not present

## 2023-07-22 DIAGNOSIS — Z79899 Other long term (current) drug therapy: Secondary | ICD-10-CM | POA: Diagnosis not present

## 2023-07-22 DIAGNOSIS — Z7982 Long term (current) use of aspirin: Secondary | ICD-10-CM | POA: Diagnosis not present

## 2023-07-22 DIAGNOSIS — Z853 Personal history of malignant neoplasm of breast: Secondary | ICD-10-CM | POA: Diagnosis not present

## 2023-07-22 DIAGNOSIS — D509 Iron deficiency anemia, unspecified: Secondary | ICD-10-CM | POA: Diagnosis not present

## 2023-07-22 DIAGNOSIS — U071 COVID-19: Secondary | ICD-10-CM | POA: Diagnosis not present

## 2023-07-22 DIAGNOSIS — C3411 Malignant neoplasm of upper lobe, right bronchus or lung: Secondary | ICD-10-CM | POA: Diagnosis not present

## 2023-07-22 MED ORDER — SODIUM CHLORIDE 0.9% FLUSH
10.0000 mL | Freq: Two times a day (BID) | INTRAVENOUS | Status: DC
Start: 2023-07-22 — End: 2023-07-22
  Administered 2023-07-22: 10 mL via INTRAVENOUS

## 2023-07-22 MED ORDER — IRON SUCROSE 20 MG/ML IV SOLN
200.0000 mg | Freq: Once | INTRAVENOUS | Status: AC
  Administered 2023-07-22: 200 mg via INTRAVENOUS
  Filled 2023-07-22: qty 10

## 2023-07-22 NOTE — Patient Instructions (Signed)
Iron Sucrose Injection What is this medication? IRON SUCROSE (EYE ern SOO krose) treats low levels of iron (iron deficiency anemia) in people with kidney disease. Iron is a mineral that plays an important role in making red blood cells, which carry oxygen from your lungs to the rest of your body. This medicine may be used for other purposes; ask your health care provider or pharmacist if you have questions. COMMON BRAND NAME(S): Venofer What should I tell my care team before I take this medication? They need to know if you have any of these conditions: Anemia not caused by low iron levels Heart disease High levels of iron in the blood Kidney disease Liver disease An unusual or allergic reaction to iron, other medications, foods, dyes, or preservatives Pregnant or trying to get pregnant Breastfeeding How should I use this medication? This medication is for infusion into a vein. It is given in a hospital or clinic setting. Talk to your care team about the use of this medication in children. While this medication may be prescribed for children as young as 2 years for selected conditions, precautions do apply. Overdosage: If you think you have taken too much of this medicine contact a poison control center or emergency room at once. NOTE: This medicine is only for you. Do not share this medicine with others. What if I miss a dose? Keep appointments for follow-up doses. It is important not to miss your dose. Call your care team if you are unable to keep an appointment. What may interact with this medication? Do not take this medication with any of the following: Deferoxamine Dimercaprol Other iron products This medication may also interact with the following: Chloramphenicol Deferasirox This list may not describe all possible interactions. Give your health care provider a list of all the medicines, herbs, non-prescription drugs, or dietary supplements you use. Also tell them if you smoke,  drink alcohol, or use illegal drugs. Some items may interact with your medicine. What should I watch for while using this medication? Visit your care team regularly. Tell your care team if your symptoms do not start to get better or if they get worse. You may need blood work done while you are taking this medication. You may need to follow a special diet. Talk to your care team. Foods that contain iron include: whole grains/cereals, dried fruits, beans, or peas, leafy green vegetables, and organ meats (liver, kidney). What side effects may I notice from receiving this medication? Side effects that you should report to your care team as soon as possible: Allergic reactions--skin rash, itching, hives, swelling of the face, lips, tongue, or throat Low blood pressure--dizziness, feeling faint or lightheaded, blurry vision Shortness of breath Side effects that usually do not require medical attention (report to your care team if they continue or are bothersome): Flushing Headache Joint pain Muscle pain Nausea Pain, redness, or irritation at injection site This list may not describe all possible side effects. Call your doctor for medical advice about side effects. You may report side effects to FDA at 1-800-FDA-1088. Where should I keep my medication? This medication is given in a hospital or clinic. It will not be stored at home. NOTE: This sheet is a summary. It may not cover all possible information. If you have questions about this medicine, talk to your doctor, pharmacist, or health care provider.  2024 Elsevier/Gold Standard (2023-01-01 00:00:00)

## 2023-07-23 ENCOUNTER — Ambulatory Visit
Admission: RE | Admit: 2023-07-23 | Discharge: 2023-07-23 | Disposition: A | Discharge status: Home / Self Care | Payer: Medicare Other | Source: Ambulatory Visit | Attending: Radiation Oncology | Admitting: Radiation Oncology

## 2023-07-23 ENCOUNTER — Other Ambulatory Visit: Payer: Self-pay

## 2023-07-23 DIAGNOSIS — I700 Atherosclerosis of aorta: Secondary | ICD-10-CM | POA: Diagnosis not present

## 2023-07-23 DIAGNOSIS — J9610 Chronic respiratory failure, unspecified whether with hypoxia or hypercapnia: Secondary | ICD-10-CM | POA: Diagnosis not present

## 2023-07-23 DIAGNOSIS — G4700 Insomnia, unspecified: Secondary | ICD-10-CM | POA: Diagnosis not present

## 2023-07-23 DIAGNOSIS — I1310 Hypertensive heart and chronic kidney disease without heart failure, with stage 1 through stage 4 chronic kidney disease, or unspecified chronic kidney disease: Secondary | ICD-10-CM | POA: Diagnosis not present

## 2023-07-23 DIAGNOSIS — Z510 Encounter for antineoplastic radiation therapy: Secondary | ICD-10-CM | POA: Diagnosis not present

## 2023-07-23 DIAGNOSIS — C3411 Malignant neoplasm of upper lobe, right bronchus or lung: Secondary | ICD-10-CM | POA: Diagnosis not present

## 2023-07-23 DIAGNOSIS — E1122 Type 2 diabetes mellitus with diabetic chronic kidney disease: Secondary | ICD-10-CM | POA: Diagnosis not present

## 2023-07-23 DIAGNOSIS — N189 Chronic kidney disease, unspecified: Secondary | ICD-10-CM | POA: Diagnosis not present

## 2023-07-23 DIAGNOSIS — I2510 Atherosclerotic heart disease of native coronary artery without angina pectoris: Secondary | ICD-10-CM | POA: Diagnosis not present

## 2023-07-23 DIAGNOSIS — G620 Drug-induced polyneuropathy: Secondary | ICD-10-CM | POA: Diagnosis not present

## 2023-07-23 DIAGNOSIS — I69328 Other speech and language deficits following cerebral infarction: Secondary | ICD-10-CM | POA: Diagnosis not present

## 2023-07-23 DIAGNOSIS — E1151 Type 2 diabetes mellitus with diabetic peripheral angiopathy without gangrene: Secondary | ICD-10-CM | POA: Diagnosis not present

## 2023-07-23 DIAGNOSIS — E7800 Pure hypercholesterolemia, unspecified: Secondary | ICD-10-CM | POA: Diagnosis not present

## 2023-07-23 DIAGNOSIS — J4489 Other specified chronic obstructive pulmonary disease: Secondary | ICD-10-CM | POA: Diagnosis not present

## 2023-07-23 DIAGNOSIS — I69354 Hemiplegia and hemiparesis following cerebral infarction affecting left non-dominant side: Secondary | ICD-10-CM | POA: Diagnosis not present

## 2023-07-23 DIAGNOSIS — M810 Age-related osteoporosis without current pathological fracture: Secondary | ICD-10-CM | POA: Diagnosis not present

## 2023-07-23 DIAGNOSIS — I69318 Other symptoms and signs involving cognitive functions following cerebral infarction: Secondary | ICD-10-CM | POA: Diagnosis not present

## 2023-07-23 DIAGNOSIS — E876 Hypokalemia: Secondary | ICD-10-CM | POA: Diagnosis not present

## 2023-07-23 DIAGNOSIS — D509 Iron deficiency anemia, unspecified: Secondary | ICD-10-CM | POA: Diagnosis not present

## 2023-07-23 DIAGNOSIS — E1165 Type 2 diabetes mellitus with hyperglycemia: Secondary | ICD-10-CM | POA: Diagnosis not present

## 2023-07-23 DIAGNOSIS — K219 Gastro-esophageal reflux disease without esophagitis: Secondary | ICD-10-CM | POA: Diagnosis not present

## 2023-07-23 DIAGNOSIS — T451X5D Adverse effect of antineoplastic and immunosuppressive drugs, subsequent encounter: Secondary | ICD-10-CM | POA: Diagnosis not present

## 2023-07-23 LAB — RAD ONC ARIA SESSION SUMMARY
Course Elapsed Days: 9
Plan Fractions Treated to Date: 4
Plan Prescribed Dose Per Fraction: 12 Gy
Plan Total Fractions Prescribed: 5
Plan Total Prescribed Dose: 60 Gy
Reference Point Dosage Given to Date: 48 Gy
Reference Point Session Dosage Given: 12 Gy
Session Number: 4

## 2023-07-26 ENCOUNTER — Other Ambulatory Visit: Payer: Self-pay

## 2023-07-26 ENCOUNTER — Ambulatory Visit
Admission: RE | Admit: 2023-07-26 | Discharge: 2023-07-26 | Disposition: A | Discharge status: Home / Self Care | Payer: Medicare Other | Source: Ambulatory Visit | Attending: Radiation Oncology | Admitting: Radiation Oncology

## 2023-07-26 DIAGNOSIS — I69318 Other symptoms and signs involving cognitive functions following cerebral infarction: Secondary | ICD-10-CM | POA: Diagnosis not present

## 2023-07-26 DIAGNOSIS — D509 Iron deficiency anemia, unspecified: Secondary | ICD-10-CM | POA: Diagnosis not present

## 2023-07-26 DIAGNOSIS — I1310 Hypertensive heart and chronic kidney disease without heart failure, with stage 1 through stage 4 chronic kidney disease, or unspecified chronic kidney disease: Secondary | ICD-10-CM | POA: Diagnosis not present

## 2023-07-26 DIAGNOSIS — I2510 Atherosclerotic heart disease of native coronary artery without angina pectoris: Secondary | ICD-10-CM | POA: Diagnosis not present

## 2023-07-26 DIAGNOSIS — M810 Age-related osteoporosis without current pathological fracture: Secondary | ICD-10-CM | POA: Diagnosis not present

## 2023-07-26 DIAGNOSIS — N189 Chronic kidney disease, unspecified: Secondary | ICD-10-CM | POA: Diagnosis not present

## 2023-07-26 DIAGNOSIS — G620 Drug-induced polyneuropathy: Secondary | ICD-10-CM | POA: Diagnosis not present

## 2023-07-26 DIAGNOSIS — J9610 Chronic respiratory failure, unspecified whether with hypoxia or hypercapnia: Secondary | ICD-10-CM | POA: Diagnosis not present

## 2023-07-26 DIAGNOSIS — G4700 Insomnia, unspecified: Secondary | ICD-10-CM | POA: Diagnosis not present

## 2023-07-26 DIAGNOSIS — E7800 Pure hypercholesterolemia, unspecified: Secondary | ICD-10-CM | POA: Diagnosis not present

## 2023-07-26 DIAGNOSIS — E1122 Type 2 diabetes mellitus with diabetic chronic kidney disease: Secondary | ICD-10-CM | POA: Diagnosis not present

## 2023-07-26 DIAGNOSIS — K219 Gastro-esophageal reflux disease without esophagitis: Secondary | ICD-10-CM | POA: Diagnosis not present

## 2023-07-26 DIAGNOSIS — C3411 Malignant neoplasm of upper lobe, right bronchus or lung: Secondary | ICD-10-CM | POA: Diagnosis not present

## 2023-07-26 DIAGNOSIS — I700 Atherosclerosis of aorta: Secondary | ICD-10-CM | POA: Diagnosis not present

## 2023-07-26 DIAGNOSIS — T451X5D Adverse effect of antineoplastic and immunosuppressive drugs, subsequent encounter: Secondary | ICD-10-CM | POA: Diagnosis not present

## 2023-07-26 DIAGNOSIS — J4489 Other specified chronic obstructive pulmonary disease: Secondary | ICD-10-CM | POA: Diagnosis not present

## 2023-07-26 DIAGNOSIS — Z510 Encounter for antineoplastic radiation therapy: Secondary | ICD-10-CM | POA: Diagnosis not present

## 2023-07-26 DIAGNOSIS — E1165 Type 2 diabetes mellitus with hyperglycemia: Secondary | ICD-10-CM | POA: Diagnosis not present

## 2023-07-26 DIAGNOSIS — I69354 Hemiplegia and hemiparesis following cerebral infarction affecting left non-dominant side: Secondary | ICD-10-CM | POA: Diagnosis not present

## 2023-07-26 DIAGNOSIS — I69328 Other speech and language deficits following cerebral infarction: Secondary | ICD-10-CM | POA: Diagnosis not present

## 2023-07-26 DIAGNOSIS — E1151 Type 2 diabetes mellitus with diabetic peripheral angiopathy without gangrene: Secondary | ICD-10-CM | POA: Diagnosis not present

## 2023-07-26 DIAGNOSIS — E876 Hypokalemia: Secondary | ICD-10-CM | POA: Diagnosis not present

## 2023-07-26 LAB — RAD ONC ARIA SESSION SUMMARY
Course Elapsed Days: 12
Plan Fractions Treated to Date: 5
Plan Prescribed Dose Per Fraction: 12 Gy
Plan Total Fractions Prescribed: 5
Plan Total Prescribed Dose: 60 Gy
Reference Point Dosage Given to Date: 60 Gy
Reference Point Session Dosage Given: 12 Gy
Session Number: 5

## 2023-07-27 ENCOUNTER — Encounter: Payer: Self-pay | Admitting: Hematology and Oncology

## 2023-07-27 ENCOUNTER — Inpatient Hospital Stay: Payer: Medicare Other

## 2023-07-27 VITALS — BP 142/63 | HR 79 | Temp 97.7°F | Resp 16

## 2023-07-27 DIAGNOSIS — F17210 Nicotine dependence, cigarettes, uncomplicated: Secondary | ICD-10-CM | POA: Diagnosis not present

## 2023-07-27 DIAGNOSIS — Z79899 Other long term (current) drug therapy: Secondary | ICD-10-CM | POA: Diagnosis not present

## 2023-07-27 DIAGNOSIS — D509 Iron deficiency anemia, unspecified: Secondary | ICD-10-CM

## 2023-07-27 DIAGNOSIS — Z853 Personal history of malignant neoplasm of breast: Secondary | ICD-10-CM | POA: Diagnosis not present

## 2023-07-27 DIAGNOSIS — Z7982 Long term (current) use of aspirin: Secondary | ICD-10-CM | POA: Diagnosis not present

## 2023-07-27 DIAGNOSIS — C3411 Malignant neoplasm of upper lobe, right bronchus or lung: Secondary | ICD-10-CM | POA: Diagnosis not present

## 2023-07-27 MED ORDER — IRON SUCROSE 20 MG/ML IV SOLN
200.0000 mg | Freq: Once | INTRAVENOUS | Status: AC
Start: 2023-07-27 — End: 2023-07-27
  Administered 2023-07-27: 200 mg via INTRAVENOUS
  Filled 2023-07-27: qty 10

## 2023-07-27 MED ORDER — SODIUM CHLORIDE 0.9% FLUSH
10.0000 mL | Freq: Two times a day (BID) | INTRAVENOUS | Status: DC
  Administered 2023-07-27: 10 mL via INTRAVENOUS

## 2023-07-27 NOTE — Radiation Completion Notes (Addendum)
  Radiation Oncology         [phone] ________________________________  Name: Lindsay Brooks MRN: [id number]  Date of Service: 07/26/2023  DOB: 14-Feb-1951  End of Treatment Note    Diagnosis: Stage IA3, [id number], NSCLC of the RUL  Intent: Curative     ==========DELIVERED PLANS==========  First Treatment Date: 2023-07-14 Last Treatment Date: 2023-07-26   Plan Name: Lung_R_SBRT Site: Lung, Right Technique: SBRT/SRT-IMRT Mode: Photon Dose Per Fraction: 12 Gy Prescribed Dose (Delivered / Prescribed): 60 Gy / 60 Gy Prescribed Fxs (Delivered / Prescribed): 5 / 5     ==========ON TREATMENT VISIT DATES========== 2023-07-14, 2023-07-21, 2023-07-19, 2023-07-23, 2023-07-23, 2023-07-26     See weekly On Treatment Notes in Epic for details in the Media tab (listed as Progress notes on the On Treatment Visit Dates listed above). The patient tolerated radiation.    The patient will receive a call in about one month from the radiation oncology department. She will continue follow up with Dr. Gilman Buttner as well.      Osker Mason, PAC

## 2023-07-27 NOTE — Patient Instructions (Signed)
Iron Sucrose Injection What is this medication? IRON SUCROSE (EYE ern SOO krose) treats low levels of iron (iron deficiency anemia) in people with kidney disease. Iron is a mineral that plays an important role in making red blood cells, which carry oxygen from your lungs to the rest of your body. This medicine may be used for other purposes; ask your health care provider or pharmacist if you have questions. COMMON BRAND NAME(S): Venofer What should I tell my care team before I take this medication? They need to know if you have any of these conditions: Anemia not caused by low iron levels Heart disease High levels of iron in the blood Kidney disease Liver disease An unusual or allergic reaction to iron, other medications, foods, dyes, or preservatives Pregnant or trying to get pregnant Breastfeeding How should I use this medication? This medication is for infusion into a vein. It is given in a hospital or clinic setting. Talk to your care team about the use of this medication in children. While this medication may be prescribed for children as young as 2 years for selected conditions, precautions do apply. Overdosage: If you think you have taken too much of this medicine contact a poison control center or emergency room at once. NOTE: This medicine is only for you. Do not share this medicine with others. What if I miss a dose? Keep appointments for follow-up doses. It is important not to miss your dose. Call your care team if you are unable to keep an appointment. What may interact with this medication? Do not take this medication with any of the following: Deferoxamine Dimercaprol Other iron products This medication may also interact with the following: Chloramphenicol Deferasirox This list may not describe all possible interactions. Give your health care provider a list of all the medicines, herbs, non-prescription drugs, or dietary supplements you use. Also tell them if you smoke,  drink alcohol, or use illegal drugs. Some items may interact with your medicine. What should I watch for while using this medication? Visit your care team regularly. Tell your care team if your symptoms do not start to get better or if they get worse. You may need blood work done while you are taking this medication. You may need to follow a special diet. Talk to your care team. Foods that contain iron include: whole grains/cereals, dried fruits, beans, or peas, leafy green vegetables, and organ meats (liver, kidney). What side effects may I notice from receiving this medication? Side effects that you should report to your care team as soon as possible: Allergic reactions--skin rash, itching, hives, swelling of the face, lips, tongue, or throat Low blood pressure--dizziness, feeling faint or lightheaded, blurry vision Shortness of breath Side effects that usually do not require medical attention (report to your care team if they continue or are bothersome): Flushing Headache Joint pain Muscle pain Nausea Pain, redness, or irritation at injection site This list may not describe all possible side effects. Call your doctor for medical advice about side effects. You may report side effects to FDA at 1-800-FDA-1088. Where should I keep my medication? This medication is given in a hospital or clinic. It will not be stored at home. NOTE: This sheet is a summary. It may not cover all possible information. If you have questions about this medicine, talk to your doctor, pharmacist, or health care provider.  2024 Elsevier/Gold Standard (2023-01-01 00:00:00)

## 2023-07-28 DIAGNOSIS — I69318 Other symptoms and signs involving cognitive functions following cerebral infarction: Secondary | ICD-10-CM | POA: Diagnosis not present

## 2023-07-28 DIAGNOSIS — E876 Hypokalemia: Secondary | ICD-10-CM | POA: Diagnosis not present

## 2023-07-28 DIAGNOSIS — J4489 Other specified chronic obstructive pulmonary disease: Secondary | ICD-10-CM | POA: Diagnosis not present

## 2023-07-28 DIAGNOSIS — K219 Gastro-esophageal reflux disease without esophagitis: Secondary | ICD-10-CM | POA: Diagnosis not present

## 2023-07-28 DIAGNOSIS — E7800 Pure hypercholesterolemia, unspecified: Secondary | ICD-10-CM | POA: Diagnosis not present

## 2023-07-28 DIAGNOSIS — J9610 Chronic respiratory failure, unspecified whether with hypoxia or hypercapnia: Secondary | ICD-10-CM | POA: Diagnosis not present

## 2023-07-28 DIAGNOSIS — E1151 Type 2 diabetes mellitus with diabetic peripheral angiopathy without gangrene: Secondary | ICD-10-CM | POA: Diagnosis not present

## 2023-07-28 DIAGNOSIS — E1122 Type 2 diabetes mellitus with diabetic chronic kidney disease: Secondary | ICD-10-CM | POA: Diagnosis not present

## 2023-07-28 DIAGNOSIS — G620 Drug-induced polyneuropathy: Secondary | ICD-10-CM | POA: Diagnosis not present

## 2023-07-28 DIAGNOSIS — I69328 Other speech and language deficits following cerebral infarction: Secondary | ICD-10-CM | POA: Diagnosis not present

## 2023-07-28 DIAGNOSIS — E1165 Type 2 diabetes mellitus with hyperglycemia: Secondary | ICD-10-CM | POA: Diagnosis not present

## 2023-07-28 DIAGNOSIS — I69354 Hemiplegia and hemiparesis following cerebral infarction affecting left non-dominant side: Secondary | ICD-10-CM | POA: Diagnosis not present

## 2023-07-28 DIAGNOSIS — G4700 Insomnia, unspecified: Secondary | ICD-10-CM | POA: Diagnosis not present

## 2023-07-28 DIAGNOSIS — M810 Age-related osteoporosis without current pathological fracture: Secondary | ICD-10-CM | POA: Diagnosis not present

## 2023-07-28 DIAGNOSIS — I1310 Hypertensive heart and chronic kidney disease without heart failure, with stage 1 through stage 4 chronic kidney disease, or unspecified chronic kidney disease: Secondary | ICD-10-CM | POA: Diagnosis not present

## 2023-07-28 DIAGNOSIS — N189 Chronic kidney disease, unspecified: Secondary | ICD-10-CM | POA: Diagnosis not present

## 2023-07-28 DIAGNOSIS — D509 Iron deficiency anemia, unspecified: Secondary | ICD-10-CM | POA: Diagnosis not present

## 2023-07-28 DIAGNOSIS — T451X5D Adverse effect of antineoplastic and immunosuppressive drugs, subsequent encounter: Secondary | ICD-10-CM | POA: Diagnosis not present

## 2023-07-28 DIAGNOSIS — I700 Atherosclerosis of aorta: Secondary | ICD-10-CM | POA: Diagnosis not present

## 2023-07-28 DIAGNOSIS — I2510 Atherosclerotic heart disease of native coronary artery without angina pectoris: Secondary | ICD-10-CM | POA: Diagnosis not present

## 2023-07-29 ENCOUNTER — Other Ambulatory Visit: Payer: Self-pay | Admitting: Hematology and Oncology

## 2023-07-29 ENCOUNTER — Inpatient Hospital Stay: Payer: Medicare Other

## 2023-07-29 VITALS — BP 136/56 | HR 69 | Temp 98.0°F | Resp 18

## 2023-07-29 DIAGNOSIS — C3411 Malignant neoplasm of upper lobe, right bronchus or lung: Secondary | ICD-10-CM | POA: Diagnosis not present

## 2023-07-29 DIAGNOSIS — D509 Iron deficiency anemia, unspecified: Secondary | ICD-10-CM

## 2023-07-29 DIAGNOSIS — F17210 Nicotine dependence, cigarettes, uncomplicated: Secondary | ICD-10-CM | POA: Diagnosis not present

## 2023-07-29 DIAGNOSIS — Z7982 Long term (current) use of aspirin: Secondary | ICD-10-CM | POA: Diagnosis not present

## 2023-07-29 DIAGNOSIS — Z853 Personal history of malignant neoplasm of breast: Secondary | ICD-10-CM | POA: Diagnosis not present

## 2023-07-29 DIAGNOSIS — Z79899 Other long term (current) drug therapy: Secondary | ICD-10-CM | POA: Diagnosis not present

## 2023-07-29 MED ORDER — SODIUM CHLORIDE 0.9% FLUSH: 10.0000 mL | Freq: Once | INTRAVENOUS | Status: DC | PRN

## 2023-07-29 MED ORDER — SODIUM CHLORIDE 0.9% FLUSH
10.0000 mL | Freq: Two times a day (BID) | INTRAVENOUS | Status: DC
  Administered 2023-07-29: 10 mL via INTRAVENOUS

## 2023-07-29 MED ORDER — IRON SUCROSE 20 MG/ML IV SOLN
200.0000 mg | Freq: Once | INTRAVENOUS | Status: AC
Start: 2023-07-29 — End: 2023-07-29
  Administered 2023-07-29: 200 mg via INTRAVENOUS
  Filled 2023-07-29: qty 10

## 2023-07-29 MED ORDER — HEPARIN SOD (PORK) LOCK FLUSH 100 UNIT/ML IV SOLN
500.0000 [IU] | Freq: Once | INTRAVENOUS | Status: DC | PRN
Start: 2023-07-29 — End: 2023-07-29

## 2023-07-29 NOTE — Patient Instructions (Signed)
Iron Sucrose Injection What is this medication? IRON SUCROSE (EYE ern SOO krose) treats low levels of iron (iron deficiency anemia) in people with kidney disease. Iron is a mineral that plays an important role in making red blood cells, which carry oxygen from your lungs to the rest of your body. This medicine may be used for other purposes; ask your health care provider or pharmacist if you have questions. COMMON BRAND NAME(S): Venofer What should I tell my care team before I take this medication? They need to know if you have any of these conditions: Anemia not caused by low iron levels Heart disease High levels of iron in the blood Kidney disease Liver disease An unusual or allergic reaction to iron, other medications, foods, dyes, or preservatives Pregnant or trying to get pregnant Breastfeeding How should I use this medication? This medication is for infusion into a vein. It is given in a hospital or clinic setting. Talk to your care team about the use of this medication in children. While this medication may be prescribed for children as young as 2 years for selected conditions, precautions do apply. Overdosage: If you think you have taken too much of this medicine contact a poison control center or emergency room at once. NOTE: This medicine is only for you. Do not share this medicine with others. What if I miss a dose? Keep appointments for follow-up doses. It is important not to miss your dose. Call your care team if you are unable to keep an appointment. What may interact with this medication? Do not take this medication with any of the following: Deferoxamine Dimercaprol Other iron products This medication may also interact with the following: Chloramphenicol Deferasirox This list may not describe all possible interactions. Give your health care provider a list of all the medicines, herbs, non-prescription drugs, or dietary supplements you use. Also tell them if you smoke,  drink alcohol, or use illegal drugs. Some items may interact with your medicine. What should I watch for while using this medication? Visit your care team regularly. Tell your care team if your symptoms do not start to get better or if they get worse. You may need blood work done while you are taking this medication. You may need to follow a special diet. Talk to your care team. Foods that contain iron include: whole grains/cereals, dried fruits, beans, or peas, leafy green vegetables, and organ meats (liver, kidney). What side effects may I notice from receiving this medication? Side effects that you should report to your care team as soon as possible: Allergic reactions--skin rash, itching, hives, swelling of the face, lips, tongue, or throat Low blood pressure--dizziness, feeling faint or lightheaded, blurry vision Shortness of breath Side effects that usually do not require medical attention (report to your care team if they continue or are bothersome): Flushing Headache Joint pain Muscle pain Nausea Pain, redness, or irritation at injection site This list may not describe all possible side effects. Call your doctor for medical advice about side effects. You may report side effects to FDA at 1-800-FDA-1088. Where should I keep my medication? This medication is given in a hospital or clinic. It will not be stored at home. NOTE: This sheet is a summary. It may not cover all possible information. If you have questions about this medicine, talk to your doctor, pharmacist, or health care provider.  2024 Elsevier/Gold Standard (2023-01-01 00:00:00)

## 2023-07-30 ENCOUNTER — Inpatient Hospital Stay (HOSPITAL_BASED_OUTPATIENT_CLINIC_OR_DEPARTMENT_OTHER): Payer: Medicare Other | Admitting: Hematology and Oncology

## 2023-07-30 ENCOUNTER — Encounter: Payer: Self-pay | Admitting: Hematology and Oncology

## 2023-07-30 ENCOUNTER — Inpatient Hospital Stay: Payer: Medicare Other

## 2023-07-30 VITALS — BP 148/67 | HR 77 | Temp 98.0°F | Resp 20 | Ht 61.0 in | Wt 119.0 lb

## 2023-07-30 DIAGNOSIS — C3411 Malignant neoplasm of upper lobe, right bronchus or lung: Secondary | ICD-10-CM | POA: Diagnosis not present

## 2023-07-30 DIAGNOSIS — D509 Iron deficiency anemia, unspecified: Secondary | ICD-10-CM | POA: Diagnosis not present

## 2023-07-30 DIAGNOSIS — Z171 Estrogen receptor negative status [ER-]: Secondary | ICD-10-CM | POA: Diagnosis not present

## 2023-07-30 DIAGNOSIS — Z853 Personal history of malignant neoplasm of breast: Secondary | ICD-10-CM | POA: Diagnosis not present

## 2023-07-30 DIAGNOSIS — C50412 Malignant neoplasm of upper-outer quadrant of left female breast: Secondary | ICD-10-CM | POA: Diagnosis not present

## 2023-07-30 DIAGNOSIS — Z7982 Long term (current) use of aspirin: Secondary | ICD-10-CM | POA: Diagnosis not present

## 2023-07-30 DIAGNOSIS — D500 Iron deficiency anemia secondary to blood loss (chronic): Secondary | ICD-10-CM | POA: Diagnosis not present

## 2023-07-30 DIAGNOSIS — F17210 Nicotine dependence, cigarettes, uncomplicated: Secondary | ICD-10-CM | POA: Diagnosis not present

## 2023-07-30 DIAGNOSIS — Z79899 Other long term (current) drug therapy: Secondary | ICD-10-CM | POA: Diagnosis not present

## 2023-07-30 LAB — CBC WITH DIFFERENTIAL (CANCER CENTER ONLY)
Abs Immature Granulocytes: 0.05 10*3/uL (ref 0.00–0.07)
Basophils Absolute: 0 10*3/uL (ref 0.0–0.1)
Basophils Relative: 0 %
Eosinophils Absolute: 0.1 10*3/uL (ref 0.0–0.5)
Eosinophils Relative: 1 %
HCT: 32.5 % — ABNORMAL LOW (ref 36.0–46.0)
Hemoglobin: 10 g/dL — ABNORMAL LOW (ref 12.0–15.0)
Immature Granulocytes: 1 %
Lymphocytes Relative: 13 %
Lymphs Abs: 0.8 10*3/uL (ref 0.7–4.0)
MCH: 26.8 pg (ref 26.0–34.0)
MCHC: 30.8 g/dL (ref 30.0–36.0)
MCV: 87.1 fL (ref 80.0–100.0)
Monocytes Absolute: 0.5 10*3/uL (ref 0.1–1.0)
Monocytes Relative: 7 %
Neutro Abs: 5 10*3/uL (ref 1.7–7.7)
Neutrophils Relative %: 78 %
Platelet Count: 271 10*3/uL (ref 150–400)
RBC: 3.73 MIL/uL — ABNORMAL LOW (ref 3.87–5.11)
RDW: 27.8 % — ABNORMAL HIGH (ref 11.5–15.5)
WBC Count: 6.5 10*3/uL (ref 4.0–10.5)
nRBC: 0 % (ref 0.0–0.2)
nRBC: 0 /100{WBCs}

## 2023-07-30 LAB — CMP (CANCER CENTER ONLY)
ALT: <5 U/L (ref 0–44)
AST: 17 U/L (ref 15–41)
Albumin: 3.3 g/dL — ABNORMAL LOW (ref 3.5–5.0)
Alkaline Phosphatase: 60 U/L (ref 38–126)
Anion gap: 12 (ref 5–15)
BUN: 13 mg/dL (ref 8–23)
CO2: 24 mmol/L (ref 22–32)
Calcium: 9.2 mg/dL (ref 8.9–10.3)
Chloride: 108 mmol/L (ref 98–111)
Creatinine: 0.82 mg/dL (ref 0.44–1.00)
GFR, Estimated: >60 mL/min (ref >60)
Glucose, Bld: 150 mg/dL — ABNORMAL HIGH (ref 70–99)
Potassium: 3.2 mmol/L — ABNORMAL LOW (ref 3.5–5.1)
Sodium: 144 mmol/L (ref 135–145)
Total Bilirubin: <0.2 mg/dL (ref <1.2)
Total Protein: 6.4 g/dL — ABNORMAL LOW (ref 6.5–8.1)

## 2023-07-30 LAB — SAMPLE TO BLOOD BANK

## 2023-07-30 NOTE — Assessment & Plan Note (Signed)
Stage IB triple negative breast cancer. She had difficulty tolerating chemotherapy with docetaxel/cyclophosphamide despite dose reduction.  She completed chemotherapy in December 2023.  She intermittently required IV fluids after completion of chemotherapy.  She received adjuvant radiation to the left breast completed in very 2024.  Bilateral diagnostic mammogram in June did not reveal any evidence of malignancy.  She remains without evidence of recurrence.

## 2023-07-30 NOTE — Assessment & Plan Note (Signed)
Iron deficiency anemia. She did not tolerate oral iron. She received IV iron when hospitalized earlier this month. We continued this as an outpatient and she completed 5 doses of iron sucrose yesterday.

## 2023-07-30 NOTE — Progress Notes (Unsigned)
[facility]  [address] [place],  [place]  [address] [phone]  Clinic Day:  07/30/2023  Referring physician: [name], MD  ASSESSMENT & PLAN:   Assessment & Plan: Lung cancer, upper lobe [facility]) New diagnosis of non-small cell lung cancer of the right upper lobe. Clinical stage IB (T1b N0 M0).  CT chest, abdomen and pelvis done in the ER July 22 after a fall revealed a 25 x 22 mm right upper lobe groundglass nodule increased in size from September 2023.  There were central lobar micronodules in the right upper lobe, lingula and right middle lobe.  PET on August 16 revealed 2.5 cm subsolid nodule in the central right upper lobe, suspicious for primary bronchogenic carcinoma, specifically semi invasive adenocarcinoma. No findings suspicious for metastatic disease. Radiation changes involving the left breast and anterior left upper lobe. Multiple left anterolateral rib fracture deformities, including a mildly displaced left lateral 9th rib. Hypermetabolic 12 mm right isthmic thyroid nodule. Recommend thyroid US and biopsy. She was referred to Dr. Delton Coombes and underwent bronchoscopy on September 9.  Brushings revealed malignant cells consistent with non-small cell lung cancer.  Dr. Delton Coombes recommended an MRI head and PET. MRI brain did not reveal any evidence of intercranial metastasis. Mild chronic small vessel ischemic disease was seen.  She was referred to Dr. Mitzi Hansen for consideration of SBRT for her lung cancer. She was then admitted December 2 with altered mental status. This was felt to be due to pseudoseizure, which she had evaluated in 2022.   Neurology was consulted.  EEG was normal.  MRI brain during her hospitalization did not reveal any evidence of intracranial abnormality. Psychiatry referral as an outpatient was recommended to standardize meds-again has polypharmacy and was not probably taking all the meds she needs to-given a very low dose of Xanax twice daily given  functional nature of her disorder      Echocardiogram revealed grade I diastolic dysfunction with an ejection fraction of 70-75%.  CTA chest did not reveal any evidence of pulmonary embolism.  There was continued subpleural nodularity and thickening anteriorly in the lingular segment of left upper lobe which may be increased compared to prior exam, suggesting worsening inflammation. Grossly stable semi solid density seen centrally in right upper lobe. Adenocarcinoma cannot be excluded. Follow up by CT is recommended in 12 months, with continued annual surveillance for a minimum of 3 years.  She had worsening iron deficiency, so was given IV iron in the hospital.  We have continued this as an outpatient and she completed 5 doses of iron sucrose on 1219.  She has been back to Dr. Mitzi Hansen and completed SBRT to the right upper lobe mass.  Breast cancer of upper-outer quadrant of left female breast (HCC) Stage IB triple negative breast cancer. She had difficulty tolerating chemotherapy with docetaxel/cyclophosphamide despite dose reduction.  She completed chemotherapy in December 2023.  She intermittently required IV fluids after completion of chemotherapy.  She received adjuvant radiation to the left breast completed in very 2024.  Bilateral diagnostic mammogram in June did not reveal any evidence of malignancy.  She remains without evidence of recurrence.   Iron deficiency anemia Iron deficiency anemia. She did not tolerate oral iron. She received IV iron when hospitalized earlier this month. We continued this as an outpatient and she completed 5 doses of iron sucrose yesterday.    The patient understands the plans discussed today and is in agreement with them.  She knows to contact  our office if she develops concerns prior to her next appointment.   I provided *** minutes of face-to-face time during this encounter and > 50% was spent counseling as documented under my assessment and plan.    [name], PA-C  [facility] [place] [facility] [address] [place] [address] Dept: [phone] Dept Fax: [phone]   No orders of the defined types were placed in this encounter.     CHIEF COMPLAINT:  CC: Stage IA3 non-small cell lung cancer  Current Treatment: Completed SBRT  HISTORY OF PRESENT ILLNESS:   Oncology History  Breast cancer of upper-outer quadrant of left female breast (HCC)  03/18/2022 Initial Diagnosis   Breast cancer of upper-outer quadrant of left female breast (HCC)   04/28/2022 Cancer Staging   Staging form: Breast, AJCC 8th Edition - Clinical stage from 04/28/2022: Stage IB (cT1c, cN0(sn), cM0, G3, ER-, PR-, HER2-) - Signed by Dellia Beckwith, MD on 04/28/2022 Histopathologic type: Infiltrating duct carcinoma, NOS Stage prefix: Initial diagnosis Method of lymph node assessment: Sentinel lymph node biopsy Nuclear grade: G3 Multigene prognostic tests performed: None Histologic grading system: 3 grade system Laterality: Left Tumor size (mm): 20 Lymph-vascular invasion (LVI): LVI not present (absent)/not identified Diagnostic confirmation: Positive histology Specimen type: Excision Staged by: Managing physician Menopausal status: Postmenopausal Ki-67 (%): 40 Stage used in treatment planning: Yes National guidelines used in treatment planning: Yes Type of national guideline used in treatment planning: NCCN   05/07/2022 - 07/14/2022 Chemotherapy   Patient is on Treatment Plan : BREAST TC q21d     05/19/2022 Genetic Testing   Negative hereditary cancer genetic testing: no pathogenic variant detected in [facility] Common Hereditary Cancers +RNA Panel.  Report date is May 19, 2022.    The Common Hereditary Cancers + RNA Panel offered by [facility] includes sequencing, deletion/duplication, and RNA testing of the following 47 genes: APC, ATM, AXIN2, BARD1, BMPR1A, BRCA1, BRCA2, BRIP1, CDH1,  CDK4*, CDKN2A (p14ARF)*, CDKN2A (p16INK4a)*, CHEK2, CTNNA1, DICER1, EPCAM (Deletion/duplication testing only), GREM1 (promoter region deletion/duplication testing only), KIT, MEN1, MLH1, MSH2, MSH3, MSH6, MUTYH, NBN, NF1, NHTL1, PALB2, PDGFRA*, PMS2, POLD1, POLE, PTEN, RAD50, RAD51C, RAD51D, SDHB, SDHC, SDHD, SMAD4, SMARCA4. STK11, TP53, TSC1, TSC2, and VHL.  The following genes were evaluated for sequence changes only: SDHA and HOXB13 c.251G>A variant only.  RNA analysis is not performed for the * genes.     Primary cancer of right upper lobe of lung (HCC)  04/19/2023 Cancer Staging   Staging form: Lung, AJCC 8th Edition - Clinical stage from 04/19/2023: Stage IA3 (cT1c, cN0, cM0) - Signed by Dellia Beckwith, MD on 05/23/2023 Histopathologic type: Carcinoma, NOS Stage prefix: Initial diagnosis Laterality: Right Tumor size (mm): 25 Lymph-vascular invasion (LVI): Presence of LVI unknown/indeterminate Diagnostic confirmation: Positive cytology, no positive histology Specimen type: Bronchial Biopsy Staged by: Managing physician Type of lung cancer: Resectable non-small cell lung cancer in inoperable patients treated with radiotherapy ECOG performance status: Grade 1 Symptoms: Absent Prognostic indicators: Non small cell lung cancer Stage used in treatment planning: Yes National guidelines used in treatment planning: Yes Type of national guideline used in treatment planning: NCCN   05/23/2023 Initial Diagnosis   Primary cancer of right upper lobe of lung (HCC)       INTERVAL HISTORY:  Lindsay Brooks is here today for repeat clinical assessment. She denies fevers or chills. She denies pain. Her appetite is good. Her weight has decreased  2 pounds over last 2 months . Still having episodes of syncope. Better since iron. Shortness of breath with exertion, cough prod creamy sputum. Night sweats.  Episodes of syncope.  REVIEW OF SYSTEMS:  Review of Systems  Constitutional:  Positive for diaphoresis.  Negative for appetite change, chills, fatigue, fever and unexpected weight change.  HENT:   Negative for lump/mass, mouth sores, nosebleeds, sore throat and trouble swallowing.   Respiratory:  Positive for shortness of breath. Negative for cough and hemoptysis.   Cardiovascular:  Negative for chest pain, leg swelling and palpitations.  Gastrointestinal:  Negative for abdominal pain, blood in stool, constipation, diarrhea, nausea and vomiting.  Endocrine: Negative for hot flashes.  Genitourinary:  Negative for difficulty urinating, dysuria, frequency, hematuria and vaginal bleeding.   Musculoskeletal:  Negative for arthralgias, back pain and myalgias.  Skin:  Negative for rash.  Neurological:  Positive for numbness (extremities). Negative for dizziness, extremity weakness, headaches and light-headedness.  Hematological:  Negative for adenopathy. Does not bruise/bleed easily.  Psychiatric/Behavioral:  Negative for depression and sleep disturbance. The patient is not nervous/anxious.      VITALS:  Blood pressure (!) 148/67, pulse 77, temperature 98 F (36.7 C), temperature source Oral, resp. rate 20, height 5\' 1"  (1.549 m), weight 119 lb (54 kg), SpO2 98%.  Wt Readings from Last 3 Encounters:  07/30/23 119 lb (54 kg)  07/13/23 122 lb 12.7 oz (55.7 kg)  06/15/23 118 lb 8 oz (53.8 kg)    Body mass index is 22.48 kg/m.  Performance status (ECOG): 2 - Symptomatic, <50% confined to bed  PHYSICAL EXAM:  Physical Exam Vitals and nursing note reviewed.  Constitutional:      General: She is not in acute distress.    Appearance: Normal appearance.  HENT:     Head: Normocephalic and atraumatic.     Mouth/Throat:     Mouth: Mucous membranes are moist.     Pharynx: Oropharynx is clear. No oropharyngeal exudate or posterior oropharyngeal erythema.  Eyes:     General: No scleral icterus.    Extraocular Movements: Extraocular movements intact.     Conjunctiva/sclera: Conjunctivae normal.      Pupils: Pupils are equal, round, and reactive to light.  Cardiovascular:     Rate and Rhythm: Normal rate and regular rhythm.     Heart sounds: Normal heart sounds. No murmur heard.    No friction rub. No gallop.  Pulmonary:     Effort: Pulmonary effort is normal.     Breath sounds: Normal breath sounds. No wheezing, rhonchi or rales.  Chest:     Comments: Breast exam is deferred. Abdominal:     General: There is no distension.     Palpations: Abdomen is soft. There is no hepatomegaly, splenomegaly or mass.     Tenderness: There is no abdominal tenderness.  Musculoskeletal:        General: Normal range of motion.     Cervical back: Normal range of motion and neck supple. No tenderness.     Right lower leg: No edema.     Left lower leg: No edema.  Lymphadenopathy:     Cervical: No cervical adenopathy.     Upper Body:     Right upper body: No supraclavicular or axillary adenopathy.     Left upper body: No supraclavicular or axillary adenopathy.     Lower Body: No right inguinal adenopathy. No left inguinal adenopathy.  Skin:    General: Skin is warm and dry.  Coloration: Skin is not jaundiced.     Findings: No rash.  Neurological:     Mental Status: She is alert and oriented to person, place, and time.     Cranial Nerves: No cranial nerve deficit.  Psychiatric:        Mood and Affect: Mood normal.        Behavior: Behavior normal.        Thought Content: Thought content normal.    LABS:      Latest Ref Rng & Units 07/30/2023    2:05 PM 07/15/2023    4:25 AM 07/14/2023    4:03 AM  CBC  WBC 4.0 - 10.5 K/uL 6.5  4.5  4.4   Hemoglobin 12.0 - 15.0 g/dL 16.1  7.7  7.8   Hematocrit 36.0 - 46.0 % 32.5  25.4  27.4   Platelets 150 - 400 K/uL 271  154  174       Latest Ref Rng & Units 07/30/2023    2:05 PM 07/15/2023    4:25 AM 07/14/2023    4:03 AM  CMP  Glucose 70 - 99 mg/dL 096  045  76   BUN 8 - 23 mg/dL 13  18  22    Creatinine 0.44 - 1.00 mg/dL 4.09  8.11  9.14    Sodium 135 - 145 mmol/L 144  140  139   Potassium 3.5 - 5.1 mmol/L 3.2  3.7  4.0   Chloride 98 - 111 mmol/L 108  112  111   CO2 22 - 32 mmol/L 24  24  21    Calcium 8.9 - 10.3 mg/dL 9.2  8.2  8.1   Total Protein 6.5 - 8.1 g/dL 6.4  5.2    Total Bilirubin <1.2 mg/dL <7.8  0.4    Alkaline Phos 38 - 126 U/L 60  50    AST 15 - 41 U/L 17  13    ALT 0 - 44 U/L <5  7       No results found for: "CEA1", "CEA" / No results found for: "CEA1", "CEA" No results found for: "PSA1" No results found for: "GNF621" No results found for: "CAN125"  No results found for: "TOTALPROTELP", "ALBUMINELP", "A1GS", "A2GS", "BETS", "BETA2SER", "GAMS", "MSPIKE", "SPEI" Lab Results  Component Value Date   TIBC 553 (H) 06/03/2023   TIBC 420 04/09/2023   TIBC 361 10/20/2022   FERRITIN 3 (L) 06/03/2023   FERRITIN 11 10/20/2022   IRONPCTSAT 4 (L) 06/03/2023   IRONPCTSAT 8 (L) 04/09/2023   IRONPCTSAT 27 10/20/2022   No results found for: "LDH"  STUDIES:  CT Angio Chest Pulmonary Embolism (PE) W or WO Contrast Result Date: 07/13/2023 CLINICAL DATA:  Elevated D-dimer level. EXAM: CT ANGIOGRAPHY CHEST WITH CONTRAST TECHNIQUE: Multidetector CT imaging of the chest was performed using the standard protocol during bolus administration of intravenous contrast. Multiplanar CT image reconstructions and MIPs were obtained to evaluate the vascular anatomy. RADIATION DOSE REDUCTION: This exam was performed according to the departmental dose-optimization program which includes automated exposure control, adjustment of the mA and/or kV according to patient size and/or use of iterative reconstruction technique. CONTRAST:  75mL OMNIPAQUE IOHEXOL 350 MG/ML SOLN COMPARISON:  CT scan of April 13, 2023. FINDINGS: Cardiovascular: Satisfactory opacification of the pulmonary arteries to the segmental level. No evidence of pulmonary embolism. Normal heart size. No pericardial effusion. Mediastinum/Nodes: No enlarged mediastinal, hilar, or  axillary lymph nodes. Thyroid gland, trachea, and esophagus demonstrate no significant findings. Lungs/Pleura: No pneumothorax or  pleural effusion is noted. Mild biapical scarring is noted. There is continued subpleural nodularity and thickening anteriorly in the lingular segment of left upper lobe which may be increased compared to prior exam, suggesting increased inflammation. Stable semi solid nodular density seen centrally in right upper lobe as described on prior studies. Upper Abdomen: No acute abnormality. Musculoskeletal: Old left rib fractures are noted. No acute osseous abnormality seen. Review of the MIP images confirms the above findings. IMPRESSION: No definite evidence of pulmonary embolus. There is continued subpleural nodularity in thickening anteriorly in the lingular segment of left upper lobe which may be increased compared to prior exam, suggesting worsening inflammation. Grossly stable semi solid density seen centrally in right upper lobe. Adenocarcinoma cannot be excluded. Follow up by CT is recommended in 12 months, with continued annual surveillance for a minimum of 3 years. These recommendations are taken from: Recommendations for the Management of Subsolid Pulmonary Nodules Detected at CT: A Statement from the Fleischner Society Radiology 2013; 266:1, [id number]. Aortic Atherosclerosis (ICD10-I70.0). Electronically Signed   By: Lupita Raider M.D.   On: 07/13/2023 20:40   EEG adult Result Date: 07/13/2023 Charlsie Quest, MD     07/13/2023  7:24 PM Patient Name: Lindsay Brooks MRN: [id number] Epilepsy Attending: Charlsie Quest Referring Physician/Provider: Tegeler, Canary Brim, MD Date: 07/13/2023 Duration: 23.46 mins Patient history: 72 yo F with left hand numbness, tongue twisting up, and nonverbal state that resolved in 15 minutes.  While in CT patient patient had a shaking episode of bilateral shoulders for approximately 30 seconds and was given lorazepam with resolution of  symptoms. EEG to evaluate for seizure Level of alertness: Awake, asleep AEDs during EEG study: VPA Technical aspects: This EEG study was done with scalp electrodes positioned according to the 10-20 International system of electrode placement. Electrical activity was reviewed with band pass filter of 1-70Hz , sensitivity of 7 uV/mm, display speed of 80mm/sec with a 60Hz  notched filter applied as appropriate. EEG data were recorded continuously and digitally stored.  Video monitoring was available and reviewed as appropriate. Description: The posterior dominant rhythm consists of 9-10 Hz activity of moderate voltage (25-35 uV) seen predominantly in posterior head regions, symmetric and reactive to eye opening and eye closing. Sleep was characterized by vertex waves, sleep spindles (12 to 14 Hz), maximal frontocentral region. Physiologic photic driving was not seen during photic stimulation.  Hyperventilation was not performed.   IMPRESSION: This study is within normal limits. No seizures or epileptiform discharges were seen throughout the recording. A normal interictal EEG does not exclude the diagnosis of epilepsy. Charlsie Quest   ECHOCARDIOGRAM COMPLETE Result Date: 07/13/2023    ECHOCARDIOGRAM REPORT   Patient Name:   Lindsay Brooks Date of Exam: 07/13/2023 Medical Rec #:  [id number]        Height:       61.0 in Accession #:    [id number]       Weight:       118.0 lb Date of Birth:  1950/10/04         BSA:          1.509 m Patient Age:    72 years         BP:           157/65 mmHg Patient Gender: F                HR:           67 bpm. Exam  Location:  Inpatient Procedure: 2D Echo, Cardiac Doppler and Color Doppler Indications:    Syncope  History:        Patient has prior history of Echocardiogram examinations, most                 recent 03/18/2022. CAD, COPD, Signs/Symptoms:Dyspnea and Chest                 Pain; Risk Factors:Diabetes, Dyslipidemia and Hypertension.  Sonographer:    Vern Claude Referring Phys:  [id number] HERSH GOEL IMPRESSIONS  1. Left ventricular ejection fraction, by estimation, is 70 to 75%. The left ventricle has hyperdynamic function. The left ventricle has no regional wall motion abnormalities. Left ventricular diastolic parameters are consistent with Grade I diastolic dysfunction (impaired relaxation).  2. Right ventricular systolic function is normal. The right ventricular size is normal. There is normal pulmonary artery systolic pressure.  3. The mitral valve is grossly normal. No evidence of mitral valve regurgitation. No evidence of mitral stenosis.  4. The aortic valve was not well visualized. Aortic valve regurgitation is not visualized. No aortic stenosis is present.  5. The inferior vena cava is normal in size with greater than 50% respiratory variability, suggesting right atrial pressure of 3 mmHg. Comparison(s): No prior Echocardiogram. FINDINGS  Left Ventricle: Left ventricular ejection fraction, by estimation, is 70 to 75%. The left ventricle has hyperdynamic function. The left ventricle has no regional wall motion abnormalities. The left ventricular internal cavity size was normal in size. There is no left ventricular hypertrophy. Left ventricular diastolic parameters are consistent with Grade I diastolic dysfunction (impaired relaxation). Right Ventricle: The right ventricular size is normal. No increase in right ventricular wall thickness. Right ventricular systolic function is normal. There is normal pulmonary artery systolic pressure. The tricuspid regurgitant velocity is 1.21 m/s, and  with an assumed right atrial pressure of 3 mmHg, the estimated right ventricular systolic pressure is 8.9 mmHg. Left Atrium: Left atrial size was normal in size. Right Atrium: Right atrial size was normal in size. Pericardium: There is no evidence of pericardial effusion. Mitral Valve: The mitral valve is grossly normal. No evidence of mitral valve regurgitation. No evidence of mitral valve stenosis.  MV peak gradient, 4.2 mmHg. The mean mitral valve gradient is 2.0 mmHg. Tricuspid Valve: The tricuspid valve is normal in structure. Tricuspid valve regurgitation is not demonstrated. No evidence of tricuspid stenosis. Aortic Valve: The aortic valve was not well visualized. Aortic valve regurgitation is not visualized. No aortic stenosis is present. Aortic valve mean gradient measures 3.0 mmHg. Aortic valve peak gradient measures 6.5 mmHg. Aortic valve area, by VTI measures 3.41 cm. Pulmonic Valve: The pulmonic valve was not well visualized. Pulmonic valve regurgitation is not visualized. No evidence of pulmonic stenosis. Aorta: The aortic root, ascending aorta and aortic arch are all structurally normal, with no evidence of dilitation or obstruction. Venous: The inferior vena cava is normal in size with greater than 50% respiratory variability, suggesting right atrial pressure of 3 mmHg. IAS/Shunts: The atrial septum is grossly normal.  LEFT VENTRICLE PLAX 2D LVIDd:         4.40 cm      Diastology LVIDs:         2.90 cm      LV e' medial:    6.09 cm/s LV PW:         0.60 cm      LV E/e' medial:  12.0 LV IVS:  0.70 cm      LV e' lateral:   9.57 cm/s LVOT diam:     1.90 cm      LV E/e' lateral: 7.6 LV SV:         92 LV SV Index:   61 LVOT Area:     2.84 cm  LV Volumes (MOD) LV vol d, MOD A2C: 121.0 ml LV vol d, MOD A4C: 109.0 ml LV vol s, MOD A2C: 43.7 ml LV vol s, MOD A4C: 25.1 ml LV SV MOD A2C:     77.3 ml LV SV MOD A4C:     109.0 ml LV SV MOD BP:      82.8 ml RIGHT VENTRICLE             IVC RV Basal diam:  2.80 cm     IVC diam: 1.00 cm RV Mid diam:    1.60 cm RV S prime:     13.70 cm/s TAPSE (M-mode): 3.4 cm LEFT ATRIUM             Index        RIGHT ATRIUM          Index LA diam:        3.20 cm 2.12 cm/m   RA Area:     6.32 cm LA Vol (A2C):   40.9 ml 27.10 ml/m  RA Volume:   8.72 ml  5.78 ml/m LA Vol (A4C):   27.8 ml 18.42 ml/m LA Biplane Vol: 33.7 ml 22.33 ml/m  AORTIC VALVE                     PULMONIC VALVE AV Area (Vmax):    3.24 cm     PV Vmax:       0.78 m/s AV Area (Vmean):   3.30 cm     PV Peak grad:  2.4 mmHg AV Area (VTI):     3.41 cm AV Vmax:           127.00 cm/s AV Vmean:          75.200 cm/s AV VTI:            0.271 m AV Peak Grad:      6.5 mmHg AV Mean Grad:      3.0 mmHg LVOT Vmax:         145.00 cm/s LVOT Vmean:        87.400 cm/s LVOT VTI:          0.326 m LVOT/AV VTI ratio: 1.20  AORTA Ao Root diam: 2.90 cm Ao Asc diam:  2.70 cm MITRAL VALVE               TRICUSPID VALVE MV Area (PHT): 2.17 cm    TR Peak grad:   5.9 mmHg MV Area VTI:   4.75 cm    TR Vmax:        121.00 cm/s MV Peak grad:  4.2 mmHg MV Mean grad:  2.0 mmHg    SHUNTS MV Vmax:       1.03 m/s    Systemic VTI:  0.33 m MV Vmean:      60.7 cm/s   Systemic Diam: 1.90 cm MV Decel Time: 349 msec MV E velocity: 73.00 cm/s MV A velocity: 78.50 cm/s MV E/A ratio:  0.93 Riley Lam MD Electronically signed by Riley Lam MD Signature Date/Time: 07/13/2023/12:05:38 PM    Final    MR BRAIN WO CONTRAST Result Date: 07/12/2023 CLINICAL  DATA:  Nonverbal, right hand numbness, seizure like activity EXAM: MRI HEAD WITHOUT CONTRAST TECHNIQUE: Multiplanar, multiecho pulse sequences of the brain and surrounding structures were obtained without intravenous contrast. COMPARISON:  12/24/2018 MRI head, correlation is also made with 07/12/2023 CT head FINDINGS: Brain: No restricted diffusion to suggest acute or subacute infarct. No acute hemorrhage, mass, mass effect, or midline shift. No hydrocephalus or extra-axial collection. Pituitary and craniocervical junction within normal limits. No hemosiderin deposition to suggest remote hemorrhage. Scattered T2 hyperintense signal in the periventricular white matter and pons, likely the sequela of chronic small vessel ischemic disease. Vascular: Normal arterial flow voids. Skull and upper cervical spine: Normal marrow signal. Sinuses/Orbits: Mucosal thickening and air-fluid level in  the right maxillary sinus. Mucosal thickening in the ethmoid air cells and frontal sinuses. No acute finding in the orbits. Status post bilateral lens replacements. Other: Fluid in the right-greater-than-left mastoid air cells. IMPRESSION: 1. No acute intracranial process. No evidence of acute or subacute infarct. 2. Mucosal thickening and air-fluid level in the right maxillary sinus, which can be seen in the setting of acute sinusitis. Electronically Signed   By: Wiliam Ke M.D.   On: 07/12/2023 19:23   CT ANGIO HEAD NECK W WO CM W PERF Result Date: 07/12/2023 CLINICAL DATA:  Neuro deficit, acute, stroke suspected. Right hand numbness. Altered mental status. History of lung cancer. EXAM: CT ANGIOGRAPHY HEAD AND NECK CT PERFUSION BRAIN TECHNIQUE: Multidetector CT imaging of the head and neck was performed using the standard protocol during bolus administration of intravenous contrast. Multiplanar CT image reconstructions and MIPs were obtained to evaluate the vascular anatomy. Carotid stenosis measurements (when applicable) are obtained utilizing NASCET criteria, using the distal internal carotid diameter as the denominator. Multiphase CT imaging of the brain was performed following IV bolus contrast injection. Subsequent parametric perfusion maps were calculated using RAPID software. RADIATION DOSE REDUCTION: This exam was performed according to the departmental dose-optimization program which includes automated exposure control, adjustment of the mA and/or kV according to patient size and/or use of iterative reconstruction technique. CONTRAST:  OMNIPAQUE IOHEXOL 350 MG/ML SOLN COMPARISON:  CTA head and neck 06/08/2021 FINDINGS: CTA NECK FINDINGS Aortic arch: Standard 3 vessel aortic arch with mild-to-moderate calcified and soft plaque. No significant stenosis of the arch vessel origins. Right carotid system: Patent without evidence of stenosis or dissection. Partially retropharyngeal course of the  proximal ICA. Left carotid system: Patent without evidence of a significant common carotid or internal carotid artery stenosis or dissection. Moderate stenosis of the proximal ECA. Vertebral arteries: Patent without evidence a significant stenosis or dissection. Dominant left vertebral artery. Skeleton: Mild cervical spondylosis. Other neck: No evidence of cervical lymphadenopathy or mass. Upper chest: Mild scarring and peribronchial thickening in the upper lobes. Review of the MIP images confirms the above findings CTA HEAD FINDINGS Anterior circulation: The internal carotid arteries are patent from skull base to carotid termini with mild atherosclerosis resulting in no more than mild right cavernous stenosis, unchanged. ACAs and MCAs are patent without evidence of a proximal branch occlusion or significant proximal stenosis. No aneurysm is identified. Posterior circulation: The intracranial vertebral arteries are widely patent to the basilar. Patent PICA and SCA origins are visualized bilaterally. The basilar artery is widely patent. Posterior communicating arteries are diminutive or absent. Both PCAs are patent without evidence of a significant proximal stenosis. No aneurysm is identified. Venous sinuses: Patent. Anatomic variants: None. Review of the MIP images confirms the above findings CT Brain Perfusion Findings:  The study is motion degraded. ASPECTS: 10 CBF (<30%) Volume: 0 mL Perfusion (Tmax>6.0s) volume: 9 mL, located in the anteroinferior right frontal lobe and considered to be artifactual IMPRESSION: 1. No large vessel occlusion or flow limiting proximal intracranial stenosis. 2. Cervical carotid atherosclerosis without significant common carotid or internal carotid artery stenosis. 3. No evidence of core infarct or penumbra on CTP. 4.  Aortic Atherosclerosis (ICD10-I70.0). Electronically Signed   By: Sebastian Ache M.D.   On: 07/12/2023 16:50   CT HEAD CODE STROKE WO CONTRAST Result Date:  07/12/2023 CLINICAL DATA:  Code stroke.  Right hand numbness EXAM: CT HEAD WITHOUT CONTRAST TECHNIQUE: Contiguous axial images were obtained from the base of the skull through the vertex without intravenous contrast. RADIATION DOSE REDUCTION: This exam was performed according to the departmental dose-optimization program which includes automated exposure control, adjustment of the mA and/or kV according to patient size and/or use of iterative reconstruction technique. COMPARISON:  CT head 05/14/23 FINDINGS: Brain: No hemorrhage. No hydrocephalus. No extra-axial fluid collection. No mass effect. No mass lesion. Possible age indeterminate infarct in the right cerebellum (series 2, image 12). There is a background of mild chronic microvascular ischemic change. Vascular: No hyperdense vessel or unexpected calcification. Skull: Normal. Negative for fracture or focal lesion. Sinuses/Orbits: No acute finding. Other: None. ASPECTS [facility] Stroke Program Early CT Score): 10 IMPRESSION: No hemorrhage. Possible age indeterminate infarct in the medial right cerebellum. Electronically Signed   By: Lorenza Cambridge M.D.   On: 07/12/2023 14:58      HISTORY:   Past Medical History:  Diagnosis Date  . Anemia   . Arthritis   . CAD (coronary artery disease)    a. s/p 2 CoStar DES to RCA 06/2004. b.  LHC 12/09:  EF 65%, oCFX 40%, oRCA 30%, then 30-40% leading into stented seg that is patent, (10-20% ISR), 40% beyond stent,;  c. EF 81%, poor exercise capacity, hypotensive BP response, + chest pain, normal nuclear images (overall low risk)  . CKD (chronic kidney disease)   . Colon polyps    a. s/p removal 01/2012 - was told one might be cancerous but was not diagnosed with colon cancer - plan is for f/u colonoscopy in 5 yrs.  Marland Kitchen COPD (chronic obstructive pulmonary disease) (HCC)   . Depression   . GERD (gastroesophageal reflux disease)   . Hx of echocardiogram    a. Echo 06/2011: EF 55-60%, Gr 1 diast dysfn, mild MR  .  Insomnia   . Mini stroke    History of, in 2005 (before she was diagnosed with CAD).  . Osteoporosis   . Other and unspecified hyperlipidemia   . Other isolated or specific phobias   . Peripheral vascular disease, unspecified (HCC)    a. s/p L common femoral & left illiac stenting previously (patent by angio in 2007). - ABI 06/2011 normal. b. Carotid US 0-39% BICA 06/2011 (for f/u 2 yrs);   c.  ABIs 9/13:  normal (1.1 bilaterally)  . Personal history of peptic ulcer disease    Remotely.  Marland Kitchen Psychiatric pseudoseizure    per patient and family  . TIA (transient ischemic attack)   . Tobacco use disorder   . Type II or unspecified type diabetes mellitus without mention of complication, not stated as uncontrolled   . Unspecified essential hypertension     Past Surgical History:  Procedure Laterality Date  . APPENDECTOMY    . BREAST BIOPSY    . BRONCHIAL BIOPSY  04/19/2023  Procedure: BRONCHIAL BIOPSIES;  Surgeon: Leslye Peer, MD;  Location: [facility] ENDOSCOPY;  Service: Pulmonary;;  . BRONCHIAL BRUSHINGS  04/19/2023   Procedure: BRONCHIAL BRUSHINGS;  Surgeon: Leslye Peer, MD;  Location: [facility] ENDOSCOPY;  Service: Pulmonary;;  . BRONCHIAL NEEDLE ASPIRATION BIOPSY  04/19/2023   Procedure: BRONCHIAL NEEDLE ASPIRATION BIOPSIES;  Surgeon: Leslye Peer, MD;  Location: [facility] ENDOSCOPY;  Service: Pulmonary;;  . BRONCHIAL WASHINGS  04/19/2023   Procedure: BRONCHIAL WASHINGS;  Surgeon: Leslye Peer, MD;  Location: [facility] ENDOSCOPY;  Service: Pulmonary;;  . CARDIAC CATHETERIZATION  2009   patent RCA stent. mild CAD otherwise.   Marland Kitchen CATARACT EXTRACTION, BILATERAL  2016  . CHOLECYSTECTOMY  2009  . FIDUCIAL MARKER PLACEMENT  04/19/2023   Procedure: FIDUCIAL MARKER PLACEMENT;  Surgeon: Leslye Peer, MD;  Location: [facility] ENDOSCOPY;  Service: Pulmonary;;  . FLEXIBLE SIGMOIDOSCOPY N/A 10/21/2022   Procedure: Arnell Sieving;  Surgeon: Charna Elizabeth, MD;  Location: [facility] ENDOSCOPY;  Service: Gastroenterology;   Laterality: N/A;  . hysterectomy (other)     age 78  . KNEE ARTHROSCOPY Left   . left arm surgery    . LEFT HEART CATHETERIZATION WITH CORONARY ANGIOGRAM N/A 01/16/2014   Procedure: LEFT HEART CATHETERIZATION WITH CORONARY ANGIOGRAM;  Surgeon: Micheline Chapman, MD;  Location: [facility] CATH LAB;  Service: Cardiovascular;  Laterality: N/A;  . left leg angiography     stenting of the left common iliac artery  . left rotator cuff repair    . right coronary artery stent      Family History  Problem Relation Age of Onset  . Heart attack Mother   . Allergies Mother   . Heart attack Father   . Allergies Father   . Kidney disease Father   . Hypertension Father   . Melanoma Sister   . Coronary artery disease Brother   . Breast cancer Maternal Aunt   . Breast cancer Maternal Aunt   . Breast cancer Paternal Aunt        d. under 50  . Pancreatic cancer Maternal Grandmother   . Breast cancer Daughter        BRCA1+    Social History:  reports that she has been smoking cigarettes. She has a 66 pack-year smoking history. She has never used smokeless tobacco. She reports that she does not drink alcohol and does not use drugs.The patient is accompanied by her daughter today.  Allergies:  Allergies  Allergen Reactions  . Amoxicillin Hives and Nausea And Vomiting  . Claritin [Loratadine] Other (See Comments)    Pt's daughter notified us of this allergy (reaction?)  . Insulins Nausea And Vomiting and Other (See Comments)    Patient unsure of name of insulin  . Vioxx [Rofecoxib] Hives  . Celecoxib Hives and Nausea And Vomiting  . Liraglutide Nausea And Vomiting and Other (See Comments)    Victoza  . Propranolol Diarrhea and Nausea And Vomiting    Current Medications: Current Outpatient Medications  Medication Sig Dispense Refill  . Potassium Chloride ER 20 MEQ TBCR Take 2 tablets by mouth daily.    Marland Kitchen XIFAXAN 550 MG TABS tablet Take 550 mg by mouth 3 (three) times daily.    Marland Kitchen acetaminophen  (TYLENOL) 500 MG tablet Take 1,000 mg by mouth as needed for moderate pain.    Marland Kitchen ALPRAZolam (XANAX) 0.25 MG tablet Take 1 tablet (0.25 mg total) by mouth 2 (two) times daily as needed for anxiety. 20 tablet 0  . aspirin EC 81  MG tablet Take 1 tablet (81 mg total) by mouth daily. 30 tablet 11  . busPIRone (BUSPAR) 7.5 MG tablet Take 1 tablet (7.5 mg total) by mouth daily as needed (for anxiety). 30 tablet 0  . clopidogrel (PLAVIX) 75 MG tablet Take 1 tablet (75 mg total) by mouth daily. 30 tablet 0  . divalproex (DEPAKOTE) 250 MG DR tablet Take 1 tablet (250 mg total) by mouth 2 (two) times daily. 30 tablet 0  . donepezil (ARICEPT) 5 MG tablet Take 5 mg by mouth daily.    . DULoxetine (CYMBALTA) 60 MG capsule Take 60 mg by mouth at bedtime.    Marland Kitchen ezetimibe (ZETIA) 10 MG tablet Take 10 mg by mouth daily.    . famotidine (PEPCID) 20 MG tablet Take 20 mg by mouth 2 (two) times daily.    . ferrous sulfate 325 (65 FE) MG tablet Take 325 mg by mouth daily with breakfast. (Patient not taking: Reported on 07/30/2023)    . hydrALAZINE (APRESOLINE) 50 MG tablet Take 50 mg by mouth in the morning and at bedtime.    Marland Kitchen ipratropium-albuterol (DUONEB) 0.5-2.5 (3) MG/3ML SOLN Take 3 mLs by nebulization every 6 (six) hours as needed (for shortness of breath or wheezing).    . JARDIANCE 25 MG TABS tablet Take 1 tablet (25 mg total) by mouth daily. 30 tablet 2  . metoprolol tartrate (LOPRESSOR) 25 MG tablet Take 25 mg by mouth 2 (two) times daily.    . nitroGLYCERIN (NITROSTAT) 0.4 MG SL tablet Place 0.4 mg under the tongue every 5 (five) minutes as needed for chest pain.    Marland Kitchen PROLIA 60 MG/ML SOSY injection Inject 60 mg into the skin every 6 (six) months.    . zonisamide (ZONEGRAN) 100 MG capsule Take 200 mg by mouth at bedtime.     No current facility-administered medications for this visit.

## 2023-07-30 NOTE — Assessment & Plan Note (Addendum)
New diagnosis of non-small cell lung cancer of the right upper lobe. Clinical stage IB (T1b N0 M0).  CT chest, abdomen and pelvis done in the ER July 22 after a fall revealed a 25 x 22 mm right upper lobe groundglass nodule increased in size from September 2023.  There were central lobar micronodules in the right upper lobe, lingula and right middle lobe.  PET on August 16 revealed 2.5 cm subsolid nodule in the central right upper lobe, suspicious for primary bronchogenic carcinoma, specifically semi invasive adenocarcinoma. No findings suspicious for metastatic disease. Radiation changes involving the left breast and anterior left upper lobe. Multiple left anterolateral rib fracture deformities, including a mildly displaced left lateral 9th rib. Hypermetabolic 12 mm right isthmic thyroid nodule. Recommend thyroid US and biopsy. She was referred to Dr. Delton Coombes and underwent bronchoscopy on September 9.  Brushings revealed malignant cells consistent with non-small cell lung cancer.  Dr. Delton Coombes recommended an MRI head and PET. MRI brain did not reveal any evidence of intercranial metastasis. Mild chronic small vessel ischemic disease was seen.  She was referred to Dr. Mitzi Hansen for consideration of SBRT for her lung cancer. She was then admitted December 2 with altered mental status. This was felt to be due to pseudoseizure, which she had evaluated in 2022.   Neurology was consulted.  EEG was normal.  MRI brain during her hospitalization did not reveal any evidence of intracranial abnormality. Psychiatry referral as an outpatient was recommended to standardize meds-again has polypharmacy and was not probably taking all the meds she needs to-given a very low dose of Xanax twice daily given functional nature of her disorder      Echocardiogram revealed grade I diastolic dysfunction with an ejection fraction of 70-75%.  CTA chest did not reveal any evidence of pulmonary embolism.  There was continued subpleural nodularity  and thickening anteriorly in the lingular segment of left upper lobe which may be increased compared to prior exam, suggesting worsening inflammation. Grossly stable semi solid density seen centrally in right upper lobe. Adenocarcinoma cannot be excluded. Follow up by CT is recommended in 12 months, with continued annual surveillance for a minimum of 3 years.  She had worsening iron deficiency, so was given IV iron in the hospital.  We have continued this as an outpatient and she completed 5 doses of iron sucrose on 1219.  She has been back to Dr. Mitzi Hansen and completed SBRT to the right upper lobe mass.

## 2023-08-02 ENCOUNTER — Encounter: Payer: Self-pay | Admitting: Hematology and Oncology

## 2023-08-02 DIAGNOSIS — N189 Chronic kidney disease, unspecified: Secondary | ICD-10-CM | POA: Diagnosis not present

## 2023-08-02 DIAGNOSIS — E1122 Type 2 diabetes mellitus with diabetic chronic kidney disease: Secondary | ICD-10-CM | POA: Diagnosis not present

## 2023-08-02 DIAGNOSIS — E876 Hypokalemia: Secondary | ICD-10-CM | POA: Diagnosis not present

## 2023-08-02 DIAGNOSIS — I69328 Other speech and language deficits following cerebral infarction: Secondary | ICD-10-CM | POA: Diagnosis not present

## 2023-08-02 DIAGNOSIS — G4700 Insomnia, unspecified: Secondary | ICD-10-CM | POA: Diagnosis not present

## 2023-08-02 DIAGNOSIS — E1151 Type 2 diabetes mellitus with diabetic peripheral angiopathy without gangrene: Secondary | ICD-10-CM | POA: Diagnosis not present

## 2023-08-02 DIAGNOSIS — T451X5D Adverse effect of antineoplastic and immunosuppressive drugs, subsequent encounter: Secondary | ICD-10-CM | POA: Diagnosis not present

## 2023-08-02 DIAGNOSIS — I700 Atherosclerosis of aorta: Secondary | ICD-10-CM | POA: Diagnosis not present

## 2023-08-02 DIAGNOSIS — D509 Iron deficiency anemia, unspecified: Secondary | ICD-10-CM | POA: Diagnosis not present

## 2023-08-02 DIAGNOSIS — E7800 Pure hypercholesterolemia, unspecified: Secondary | ICD-10-CM | POA: Diagnosis not present

## 2023-08-02 DIAGNOSIS — I69318 Other symptoms and signs involving cognitive functions following cerebral infarction: Secondary | ICD-10-CM | POA: Diagnosis not present

## 2023-08-02 DIAGNOSIS — M810 Age-related osteoporosis without current pathological fracture: Secondary | ICD-10-CM | POA: Diagnosis not present

## 2023-08-02 DIAGNOSIS — J9610 Chronic respiratory failure, unspecified whether with hypoxia or hypercapnia: Secondary | ICD-10-CM | POA: Diagnosis not present

## 2023-08-02 DIAGNOSIS — I2510 Atherosclerotic heart disease of native coronary artery without angina pectoris: Secondary | ICD-10-CM | POA: Diagnosis not present

## 2023-08-02 DIAGNOSIS — I1310 Hypertensive heart and chronic kidney disease without heart failure, with stage 1 through stage 4 chronic kidney disease, or unspecified chronic kidney disease: Secondary | ICD-10-CM | POA: Diagnosis not present

## 2023-08-02 DIAGNOSIS — K219 Gastro-esophageal reflux disease without esophagitis: Secondary | ICD-10-CM | POA: Diagnosis not present

## 2023-08-02 DIAGNOSIS — E1165 Type 2 diabetes mellitus with hyperglycemia: Secondary | ICD-10-CM | POA: Diagnosis not present

## 2023-08-02 DIAGNOSIS — I69354 Hemiplegia and hemiparesis following cerebral infarction affecting left non-dominant side: Secondary | ICD-10-CM | POA: Diagnosis not present

## 2023-08-02 DIAGNOSIS — J4489 Other specified chronic obstructive pulmonary disease: Secondary | ICD-10-CM | POA: Diagnosis not present

## 2023-08-02 DIAGNOSIS — G620 Drug-induced polyneuropathy: Secondary | ICD-10-CM | POA: Diagnosis not present

## 2023-08-06 DIAGNOSIS — E1165 Type 2 diabetes mellitus with hyperglycemia: Secondary | ICD-10-CM | POA: Diagnosis not present

## 2023-08-06 DIAGNOSIS — G4700 Insomnia, unspecified: Secondary | ICD-10-CM | POA: Diagnosis not present

## 2023-08-06 DIAGNOSIS — E876 Hypokalemia: Secondary | ICD-10-CM | POA: Diagnosis not present

## 2023-08-06 DIAGNOSIS — N189 Chronic kidney disease, unspecified: Secondary | ICD-10-CM | POA: Diagnosis not present

## 2023-08-06 DIAGNOSIS — I700 Atherosclerosis of aorta: Secondary | ICD-10-CM | POA: Diagnosis not present

## 2023-08-06 DIAGNOSIS — K219 Gastro-esophageal reflux disease without esophagitis: Secondary | ICD-10-CM | POA: Diagnosis not present

## 2023-08-06 DIAGNOSIS — E1151 Type 2 diabetes mellitus with diabetic peripheral angiopathy without gangrene: Secondary | ICD-10-CM | POA: Diagnosis not present

## 2023-08-06 DIAGNOSIS — J9610 Chronic respiratory failure, unspecified whether with hypoxia or hypercapnia: Secondary | ICD-10-CM | POA: Diagnosis not present

## 2023-08-06 DIAGNOSIS — M810 Age-related osteoporosis without current pathological fracture: Secondary | ICD-10-CM | POA: Diagnosis not present

## 2023-08-06 DIAGNOSIS — T451X5D Adverse effect of antineoplastic and immunosuppressive drugs, subsequent encounter: Secondary | ICD-10-CM | POA: Diagnosis not present

## 2023-08-06 DIAGNOSIS — I69328 Other speech and language deficits following cerebral infarction: Secondary | ICD-10-CM | POA: Diagnosis not present

## 2023-08-06 DIAGNOSIS — G620 Drug-induced polyneuropathy: Secondary | ICD-10-CM | POA: Diagnosis not present

## 2023-08-06 DIAGNOSIS — E7800 Pure hypercholesterolemia, unspecified: Secondary | ICD-10-CM | POA: Diagnosis not present

## 2023-08-06 DIAGNOSIS — I2510 Atherosclerotic heart disease of native coronary artery without angina pectoris: Secondary | ICD-10-CM | POA: Diagnosis not present

## 2023-08-06 DIAGNOSIS — E1122 Type 2 diabetes mellitus with diabetic chronic kidney disease: Secondary | ICD-10-CM | POA: Diagnosis not present

## 2023-08-06 DIAGNOSIS — J4489 Other specified chronic obstructive pulmonary disease: Secondary | ICD-10-CM | POA: Diagnosis not present

## 2023-08-06 DIAGNOSIS — I69354 Hemiplegia and hemiparesis following cerebral infarction affecting left non-dominant side: Secondary | ICD-10-CM | POA: Diagnosis not present

## 2023-08-06 DIAGNOSIS — I69318 Other symptoms and signs involving cognitive functions following cerebral infarction: Secondary | ICD-10-CM | POA: Diagnosis not present

## 2023-08-06 DIAGNOSIS — D509 Iron deficiency anemia, unspecified: Secondary | ICD-10-CM | POA: Diagnosis not present

## 2023-08-06 DIAGNOSIS — I1310 Hypertensive heart and chronic kidney disease without heart failure, with stage 1 through stage 4 chronic kidney disease, or unspecified chronic kidney disease: Secondary | ICD-10-CM | POA: Diagnosis not present

## 2023-08-09 DIAGNOSIS — I2510 Atherosclerotic heart disease of native coronary artery without angina pectoris: Secondary | ICD-10-CM | POA: Diagnosis not present

## 2023-08-09 DIAGNOSIS — I1310 Hypertensive heart and chronic kidney disease without heart failure, with stage 1 through stage 4 chronic kidney disease, or unspecified chronic kidney disease: Secondary | ICD-10-CM | POA: Diagnosis not present

## 2023-08-09 DIAGNOSIS — G4700 Insomnia, unspecified: Secondary | ICD-10-CM | POA: Diagnosis not present

## 2023-08-09 DIAGNOSIS — I69328 Other speech and language deficits following cerebral infarction: Secondary | ICD-10-CM | POA: Diagnosis not present

## 2023-08-09 DIAGNOSIS — I69318 Other symptoms and signs involving cognitive functions following cerebral infarction: Secondary | ICD-10-CM | POA: Diagnosis not present

## 2023-08-09 DIAGNOSIS — E876 Hypokalemia: Secondary | ICD-10-CM | POA: Diagnosis not present

## 2023-08-09 DIAGNOSIS — D509 Iron deficiency anemia, unspecified: Secondary | ICD-10-CM | POA: Diagnosis not present

## 2023-08-09 DIAGNOSIS — J9610 Chronic respiratory failure, unspecified whether with hypoxia or hypercapnia: Secondary | ICD-10-CM | POA: Diagnosis not present

## 2023-08-09 DIAGNOSIS — J4489 Other specified chronic obstructive pulmonary disease: Secondary | ICD-10-CM | POA: Diagnosis not present

## 2023-08-09 DIAGNOSIS — E1151 Type 2 diabetes mellitus with diabetic peripheral angiopathy without gangrene: Secondary | ICD-10-CM | POA: Diagnosis not present

## 2023-08-09 DIAGNOSIS — G620 Drug-induced polyneuropathy: Secondary | ICD-10-CM | POA: Diagnosis not present

## 2023-08-09 DIAGNOSIS — T451X5D Adverse effect of antineoplastic and immunosuppressive drugs, subsequent encounter: Secondary | ICD-10-CM | POA: Diagnosis not present

## 2023-08-09 DIAGNOSIS — I69354 Hemiplegia and hemiparesis following cerebral infarction affecting left non-dominant side: Secondary | ICD-10-CM | POA: Diagnosis not present

## 2023-08-09 DIAGNOSIS — M810 Age-related osteoporosis without current pathological fracture: Secondary | ICD-10-CM | POA: Diagnosis not present

## 2023-08-09 DIAGNOSIS — K219 Gastro-esophageal reflux disease without esophagitis: Secondary | ICD-10-CM | POA: Diagnosis not present

## 2023-08-09 DIAGNOSIS — E7800 Pure hypercholesterolemia, unspecified: Secondary | ICD-10-CM | POA: Diagnosis not present

## 2023-08-09 DIAGNOSIS — N189 Chronic kidney disease, unspecified: Secondary | ICD-10-CM | POA: Diagnosis not present

## 2023-08-09 DIAGNOSIS — E1165 Type 2 diabetes mellitus with hyperglycemia: Secondary | ICD-10-CM | POA: Diagnosis not present

## 2023-08-09 DIAGNOSIS — I700 Atherosclerosis of aorta: Secondary | ICD-10-CM | POA: Diagnosis not present

## 2023-08-09 DIAGNOSIS — E1122 Type 2 diabetes mellitus with diabetic chronic kidney disease: Secondary | ICD-10-CM | POA: Diagnosis not present

## 2023-08-11 DIAGNOSIS — E785 Hyperlipidemia, unspecified: Secondary | ICD-10-CM | POA: Diagnosis not present

## 2023-08-11 DIAGNOSIS — E1169 Type 2 diabetes mellitus with other specified complication: Secondary | ICD-10-CM | POA: Diagnosis not present

## 2023-08-13 DIAGNOSIS — K219 Gastro-esophageal reflux disease without esophagitis: Secondary | ICD-10-CM | POA: Diagnosis not present

## 2023-08-13 DIAGNOSIS — I1310 Hypertensive heart and chronic kidney disease without heart failure, with stage 1 through stage 4 chronic kidney disease, or unspecified chronic kidney disease: Secondary | ICD-10-CM | POA: Diagnosis not present

## 2023-08-13 DIAGNOSIS — E1122 Type 2 diabetes mellitus with diabetic chronic kidney disease: Secondary | ICD-10-CM | POA: Diagnosis not present

## 2023-08-13 DIAGNOSIS — N189 Chronic kidney disease, unspecified: Secondary | ICD-10-CM | POA: Diagnosis not present

## 2023-08-13 DIAGNOSIS — J4489 Other specified chronic obstructive pulmonary disease: Secondary | ICD-10-CM | POA: Diagnosis not present

## 2023-08-13 DIAGNOSIS — I69328 Other speech and language deficits following cerebral infarction: Secondary | ICD-10-CM | POA: Diagnosis not present

## 2023-08-13 DIAGNOSIS — D509 Iron deficiency anemia, unspecified: Secondary | ICD-10-CM | POA: Diagnosis not present

## 2023-08-13 DIAGNOSIS — T451X5D Adverse effect of antineoplastic and immunosuppressive drugs, subsequent encounter: Secondary | ICD-10-CM | POA: Diagnosis not present

## 2023-08-13 DIAGNOSIS — E7800 Pure hypercholesterolemia, unspecified: Secondary | ICD-10-CM | POA: Diagnosis not present

## 2023-08-13 DIAGNOSIS — I69318 Other symptoms and signs involving cognitive functions following cerebral infarction: Secondary | ICD-10-CM | POA: Diagnosis not present

## 2023-08-13 DIAGNOSIS — M810 Age-related osteoporosis without current pathological fracture: Secondary | ICD-10-CM | POA: Diagnosis not present

## 2023-08-13 DIAGNOSIS — I69354 Hemiplegia and hemiparesis following cerebral infarction affecting left non-dominant side: Secondary | ICD-10-CM | POA: Diagnosis not present

## 2023-08-13 DIAGNOSIS — I2510 Atherosclerotic heart disease of native coronary artery without angina pectoris: Secondary | ICD-10-CM | POA: Diagnosis not present

## 2023-08-13 DIAGNOSIS — J9610 Chronic respiratory failure, unspecified whether with hypoxia or hypercapnia: Secondary | ICD-10-CM | POA: Diagnosis not present

## 2023-08-13 DIAGNOSIS — I700 Atherosclerosis of aorta: Secondary | ICD-10-CM | POA: Diagnosis not present

## 2023-08-13 DIAGNOSIS — E1165 Type 2 diabetes mellitus with hyperglycemia: Secondary | ICD-10-CM | POA: Diagnosis not present

## 2023-08-13 DIAGNOSIS — G620 Drug-induced polyneuropathy: Secondary | ICD-10-CM | POA: Diagnosis not present

## 2023-08-13 DIAGNOSIS — E876 Hypokalemia: Secondary | ICD-10-CM | POA: Diagnosis not present

## 2023-08-13 DIAGNOSIS — E1151 Type 2 diabetes mellitus with diabetic peripheral angiopathy without gangrene: Secondary | ICD-10-CM | POA: Diagnosis not present

## 2023-08-13 DIAGNOSIS — G4700 Insomnia, unspecified: Secondary | ICD-10-CM | POA: Diagnosis not present

## 2023-08-19 DIAGNOSIS — N189 Chronic kidney disease, unspecified: Secondary | ICD-10-CM | POA: Diagnosis not present

## 2023-08-19 DIAGNOSIS — E1151 Type 2 diabetes mellitus with diabetic peripheral angiopathy without gangrene: Secondary | ICD-10-CM | POA: Diagnosis not present

## 2023-08-19 DIAGNOSIS — I69318 Other symptoms and signs involving cognitive functions following cerebral infarction: Secondary | ICD-10-CM | POA: Diagnosis not present

## 2023-08-19 DIAGNOSIS — G620 Drug-induced polyneuropathy: Secondary | ICD-10-CM | POA: Diagnosis not present

## 2023-08-19 DIAGNOSIS — I69354 Hemiplegia and hemiparesis following cerebral infarction affecting left non-dominant side: Secondary | ICD-10-CM | POA: Diagnosis not present

## 2023-08-19 DIAGNOSIS — I69328 Other speech and language deficits following cerebral infarction: Secondary | ICD-10-CM | POA: Diagnosis not present

## 2023-08-19 DIAGNOSIS — J4489 Other specified chronic obstructive pulmonary disease: Secondary | ICD-10-CM | POA: Diagnosis not present

## 2023-08-19 DIAGNOSIS — E7800 Pure hypercholesterolemia, unspecified: Secondary | ICD-10-CM | POA: Diagnosis not present

## 2023-08-19 DIAGNOSIS — M810 Age-related osteoporosis without current pathological fracture: Secondary | ICD-10-CM | POA: Diagnosis not present

## 2023-08-19 DIAGNOSIS — E1165 Type 2 diabetes mellitus with hyperglycemia: Secondary | ICD-10-CM | POA: Diagnosis not present

## 2023-08-19 DIAGNOSIS — T451X5D Adverse effect of antineoplastic and immunosuppressive drugs, subsequent encounter: Secondary | ICD-10-CM | POA: Diagnosis not present

## 2023-08-19 DIAGNOSIS — I1310 Hypertensive heart and chronic kidney disease without heart failure, with stage 1 through stage 4 chronic kidney disease, or unspecified chronic kidney disease: Secondary | ICD-10-CM | POA: Diagnosis not present

## 2023-08-19 DIAGNOSIS — K219 Gastro-esophageal reflux disease without esophagitis: Secondary | ICD-10-CM | POA: Diagnosis not present

## 2023-08-19 DIAGNOSIS — G4700 Insomnia, unspecified: Secondary | ICD-10-CM | POA: Diagnosis not present

## 2023-08-19 DIAGNOSIS — J9610 Chronic respiratory failure, unspecified whether with hypoxia or hypercapnia: Secondary | ICD-10-CM | POA: Diagnosis not present

## 2023-08-19 DIAGNOSIS — E1122 Type 2 diabetes mellitus with diabetic chronic kidney disease: Secondary | ICD-10-CM | POA: Diagnosis not present

## 2023-08-19 DIAGNOSIS — I700 Atherosclerosis of aorta: Secondary | ICD-10-CM | POA: Diagnosis not present

## 2023-08-19 DIAGNOSIS — I2510 Atherosclerotic heart disease of native coronary artery without angina pectoris: Secondary | ICD-10-CM | POA: Diagnosis not present

## 2023-08-19 DIAGNOSIS — D509 Iron deficiency anemia, unspecified: Secondary | ICD-10-CM | POA: Diagnosis not present

## 2023-08-19 DIAGNOSIS — E876 Hypokalemia: Secondary | ICD-10-CM | POA: Diagnosis not present

## 2023-08-22 DIAGNOSIS — U071 COVID-19: Secondary | ICD-10-CM | POA: Diagnosis not present

## 2023-08-22 DIAGNOSIS — R0603 Acute respiratory distress: Secondary | ICD-10-CM | POA: Diagnosis not present

## 2023-08-23 ENCOUNTER — Ambulatory Visit
Admission: RE | Admit: 2023-08-23 | Discharge: 2023-08-23 | Disposition: A | Discharge status: Home / Self Care | Source: Ambulatory Visit | Attending: Radiation Oncology | Admitting: Radiation Oncology

## 2023-08-23 DIAGNOSIS — C3411 Malignant neoplasm of upper lobe, right bronchus or lung: Secondary | ICD-10-CM | POA: Insufficient documentation

## 2023-08-23 DIAGNOSIS — Z510 Encounter for antineoplastic radiation therapy: Secondary | ICD-10-CM | POA: Insufficient documentation

## 2023-08-23 NOTE — Progress Notes (Signed)
  Radiation Oncology         [phone] ________________________________  Name: Lindsay Brooks MRN: [id number]  Date of Service: 08/23/2023  DOB: 23-May-1951  Post Treatment Telephone Note  Diagnosis:  Stage IA3, cT1cN0M0, NSCLC of the RUL (as documented in provider EOT note)  The patient was not available for call today.   The patient has scheduled follow up with [name] for ongoing care, and was encouraged to call if she develops concerns or questions regarding radiation.    [name], LPN

## 2023-08-25 DIAGNOSIS — I1310 Hypertensive heart and chronic kidney disease without heart failure, with stage 1 through stage 4 chronic kidney disease, or unspecified chronic kidney disease: Secondary | ICD-10-CM | POA: Diagnosis not present

## 2023-08-25 DIAGNOSIS — E876 Hypokalemia: Secondary | ICD-10-CM | POA: Diagnosis not present

## 2023-08-25 DIAGNOSIS — E1122 Type 2 diabetes mellitus with diabetic chronic kidney disease: Secondary | ICD-10-CM | POA: Diagnosis not present

## 2023-08-25 DIAGNOSIS — M810 Age-related osteoporosis without current pathological fracture: Secondary | ICD-10-CM | POA: Diagnosis not present

## 2023-08-25 DIAGNOSIS — N189 Chronic kidney disease, unspecified: Secondary | ICD-10-CM | POA: Diagnosis not present

## 2023-08-25 DIAGNOSIS — G4700 Insomnia, unspecified: Secondary | ICD-10-CM | POA: Diagnosis not present

## 2023-08-25 DIAGNOSIS — I700 Atherosclerosis of aorta: Secondary | ICD-10-CM | POA: Diagnosis not present

## 2023-08-25 DIAGNOSIS — E1165 Type 2 diabetes mellitus with hyperglycemia: Secondary | ICD-10-CM | POA: Diagnosis not present

## 2023-08-25 DIAGNOSIS — E7800 Pure hypercholesterolemia, unspecified: Secondary | ICD-10-CM | POA: Diagnosis not present

## 2023-08-25 DIAGNOSIS — I69354 Hemiplegia and hemiparesis following cerebral infarction affecting left non-dominant side: Secondary | ICD-10-CM | POA: Diagnosis not present

## 2023-08-25 DIAGNOSIS — J9610 Chronic respiratory failure, unspecified whether with hypoxia or hypercapnia: Secondary | ICD-10-CM | POA: Diagnosis not present

## 2023-08-25 DIAGNOSIS — K219 Gastro-esophageal reflux disease without esophagitis: Secondary | ICD-10-CM | POA: Diagnosis not present

## 2023-08-25 DIAGNOSIS — G620 Drug-induced polyneuropathy: Secondary | ICD-10-CM | POA: Diagnosis not present

## 2023-08-25 DIAGNOSIS — I2510 Atherosclerotic heart disease of native coronary artery without angina pectoris: Secondary | ICD-10-CM | POA: Diagnosis not present

## 2023-08-25 DIAGNOSIS — I69318 Other symptoms and signs involving cognitive functions following cerebral infarction: Secondary | ICD-10-CM | POA: Diagnosis not present

## 2023-08-25 DIAGNOSIS — J4489 Other specified chronic obstructive pulmonary disease: Secondary | ICD-10-CM | POA: Diagnosis not present

## 2023-08-25 DIAGNOSIS — T451X5D Adverse effect of antineoplastic and immunosuppressive drugs, subsequent encounter: Secondary | ICD-10-CM | POA: Diagnosis not present

## 2023-08-25 DIAGNOSIS — I69328 Other speech and language deficits following cerebral infarction: Secondary | ICD-10-CM | POA: Diagnosis not present

## 2023-08-25 DIAGNOSIS — D509 Iron deficiency anemia, unspecified: Secondary | ICD-10-CM | POA: Diagnosis not present

## 2023-08-25 DIAGNOSIS — E1151 Type 2 diabetes mellitus with diabetic peripheral angiopathy without gangrene: Secondary | ICD-10-CM | POA: Diagnosis not present

## 2023-08-30 DIAGNOSIS — E1165 Type 2 diabetes mellitus with hyperglycemia: Secondary | ICD-10-CM | POA: Diagnosis not present

## 2023-08-30 DIAGNOSIS — I69318 Other symptoms and signs involving cognitive functions following cerebral infarction: Secondary | ICD-10-CM | POA: Diagnosis not present

## 2023-08-30 DIAGNOSIS — K219 Gastro-esophageal reflux disease without esophagitis: Secondary | ICD-10-CM | POA: Diagnosis not present

## 2023-08-30 DIAGNOSIS — G4700 Insomnia, unspecified: Secondary | ICD-10-CM | POA: Diagnosis not present

## 2023-08-30 DIAGNOSIS — J4489 Other specified chronic obstructive pulmonary disease: Secondary | ICD-10-CM | POA: Diagnosis not present

## 2023-08-30 DIAGNOSIS — E7800 Pure hypercholesterolemia, unspecified: Secondary | ICD-10-CM | POA: Diagnosis not present

## 2023-08-30 DIAGNOSIS — I69328 Other speech and language deficits following cerebral infarction: Secondary | ICD-10-CM | POA: Diagnosis not present

## 2023-08-30 DIAGNOSIS — E1122 Type 2 diabetes mellitus with diabetic chronic kidney disease: Secondary | ICD-10-CM | POA: Diagnosis not present

## 2023-08-30 DIAGNOSIS — D509 Iron deficiency anemia, unspecified: Secondary | ICD-10-CM | POA: Diagnosis not present

## 2023-08-30 DIAGNOSIS — E876 Hypokalemia: Secondary | ICD-10-CM | POA: Diagnosis not present

## 2023-08-30 DIAGNOSIS — N189 Chronic kidney disease, unspecified: Secondary | ICD-10-CM | POA: Diagnosis not present

## 2023-08-30 DIAGNOSIS — I2510 Atherosclerotic heart disease of native coronary artery without angina pectoris: Secondary | ICD-10-CM | POA: Diagnosis not present

## 2023-08-30 DIAGNOSIS — E1151 Type 2 diabetes mellitus with diabetic peripheral angiopathy without gangrene: Secondary | ICD-10-CM | POA: Diagnosis not present

## 2023-08-30 DIAGNOSIS — T451X5D Adverse effect of antineoplastic and immunosuppressive drugs, subsequent encounter: Secondary | ICD-10-CM | POA: Diagnosis not present

## 2023-08-30 DIAGNOSIS — I700 Atherosclerosis of aorta: Secondary | ICD-10-CM | POA: Diagnosis not present

## 2023-08-30 DIAGNOSIS — J9610 Chronic respiratory failure, unspecified whether with hypoxia or hypercapnia: Secondary | ICD-10-CM | POA: Diagnosis not present

## 2023-08-30 DIAGNOSIS — G620 Drug-induced polyneuropathy: Secondary | ICD-10-CM | POA: Diagnosis not present

## 2023-08-30 DIAGNOSIS — M810 Age-related osteoporosis without current pathological fracture: Secondary | ICD-10-CM | POA: Diagnosis not present

## 2023-08-30 DIAGNOSIS — I1310 Hypertensive heart and chronic kidney disease without heart failure, with stage 1 through stage 4 chronic kidney disease, or unspecified chronic kidney disease: Secondary | ICD-10-CM | POA: Diagnosis not present

## 2023-08-30 DIAGNOSIS — I69354 Hemiplegia and hemiparesis following cerebral infarction affecting left non-dominant side: Secondary | ICD-10-CM | POA: Diagnosis not present

## 2023-09-08 DIAGNOSIS — I69354 Hemiplegia and hemiparesis following cerebral infarction affecting left non-dominant side: Secondary | ICD-10-CM | POA: Diagnosis not present

## 2023-09-08 DIAGNOSIS — M810 Age-related osteoporosis without current pathological fracture: Secondary | ICD-10-CM | POA: Diagnosis not present

## 2023-09-08 DIAGNOSIS — I2510 Atherosclerotic heart disease of native coronary artery without angina pectoris: Secondary | ICD-10-CM | POA: Diagnosis not present

## 2023-09-08 DIAGNOSIS — I69328 Other speech and language deficits following cerebral infarction: Secondary | ICD-10-CM | POA: Diagnosis not present

## 2023-09-08 DIAGNOSIS — G620 Drug-induced polyneuropathy: Secondary | ICD-10-CM | POA: Diagnosis not present

## 2023-09-08 DIAGNOSIS — E1122 Type 2 diabetes mellitus with diabetic chronic kidney disease: Secondary | ICD-10-CM | POA: Diagnosis not present

## 2023-09-08 DIAGNOSIS — K219 Gastro-esophageal reflux disease without esophagitis: Secondary | ICD-10-CM | POA: Diagnosis not present

## 2023-09-08 DIAGNOSIS — I69318 Other symptoms and signs involving cognitive functions following cerebral infarction: Secondary | ICD-10-CM | POA: Diagnosis not present

## 2023-09-08 DIAGNOSIS — N189 Chronic kidney disease, unspecified: Secondary | ICD-10-CM | POA: Diagnosis not present

## 2023-09-08 DIAGNOSIS — E1165 Type 2 diabetes mellitus with hyperglycemia: Secondary | ICD-10-CM | POA: Diagnosis not present

## 2023-09-08 DIAGNOSIS — G4700 Insomnia, unspecified: Secondary | ICD-10-CM | POA: Diagnosis not present

## 2023-09-08 DIAGNOSIS — I700 Atherosclerosis of aorta: Secondary | ICD-10-CM | POA: Diagnosis not present

## 2023-09-08 DIAGNOSIS — D509 Iron deficiency anemia, unspecified: Secondary | ICD-10-CM | POA: Diagnosis not present

## 2023-09-08 DIAGNOSIS — J9610 Chronic respiratory failure, unspecified whether with hypoxia or hypercapnia: Secondary | ICD-10-CM | POA: Diagnosis not present

## 2023-09-08 DIAGNOSIS — J4489 Other specified chronic obstructive pulmonary disease: Secondary | ICD-10-CM | POA: Diagnosis not present

## 2023-09-08 DIAGNOSIS — I1310 Hypertensive heart and chronic kidney disease without heart failure, with stage 1 through stage 4 chronic kidney disease, or unspecified chronic kidney disease: Secondary | ICD-10-CM | POA: Diagnosis not present

## 2023-09-08 DIAGNOSIS — E7800 Pure hypercholesterolemia, unspecified: Secondary | ICD-10-CM | POA: Diagnosis not present

## 2023-09-08 DIAGNOSIS — T451X5D Adverse effect of antineoplastic and immunosuppressive drugs, subsequent encounter: Secondary | ICD-10-CM | POA: Diagnosis not present

## 2023-09-08 DIAGNOSIS — E876 Hypokalemia: Secondary | ICD-10-CM | POA: Diagnosis not present

## 2023-09-08 DIAGNOSIS — E1151 Type 2 diabetes mellitus with diabetic peripheral angiopathy without gangrene: Secondary | ICD-10-CM | POA: Diagnosis not present

## 2023-09-10 ENCOUNTER — Inpatient Hospital Stay: Payer: Medicare Other | Attending: Hematology and Oncology | Admitting: Oncology

## 2023-09-10 ENCOUNTER — Inpatient Hospital Stay: Payer: Medicare Other

## 2023-09-10 NOTE — Progress Notes (Incomplete)
[facility]  [address] [place],  [place]  [address] [phone]  Clinic Day:  09/10/23   Referring physician: [name], MD  ASSESSMENT & PLAN:   Assessment & Plan: Lung cancer, upper lobe [facility]) New diagnosis of non-small cell lung cancer of the right upper lobe. Clinical stage IB (T1b N0 M0).  CT chest, abdomen and pelvis done in the ER July 22 after a fall revealed a 25 x 22 mm right upper lobe groundglass nodule increased in size from September 2023.  There were central lobar micronodules in the right upper lobe, lingula and right middle lobe.  PET on August 16 revealed 2.5 cm subsolid nodule in the central right upper lobe, suspicious for primary bronchogenic carcinoma, specifically semi invasive adenocarcinoma. No findings suspicious for metastatic disease. Radiation changes involving the left breast and anterior left upper lobe. Multiple left anterolateral rib fracture deformities, including a mildly displaced left lateral 9th rib. Hypermetabolic 12 mm right isthmic thyroid nodule. Recommend thyroid US and biopsy. She was referred to Dr. Delton Coombes and underwent bronchoscopy on September 9.  Brushings revealed malignant cells consistent with non-small cell lung cancer.  Dr. Delton Coombes recommended an MRI head and PET. MRI brain did not reveal any evidence of intercranial metastasis. Mild chronic small vessel ischemic disease was seen.   She was referred to Dr. Mitzi Hansen for consideration of SBRT for her lung cancer. She was then admitted December 2 with altered mental status. This was felt to be due to pseudoseizure, which she had evaluated in 2022.   Neurology was consulted.  EEG was normal.  MRI brain during her hospitalization did not reveal any evidence of intracranial abnormality. Psychiatry referral as an outpatient was recommended to standardize meds-again has polypharmacy and was not probably taking all the meds she needs to-given a very low dose of Xanax twice daily given  functional nature of her disorder        Echocardiogram revealed grade I diastolic dysfunction with an ejection fraction of 70-75%.  CTA chest did not reveal any evidence of pulmonary embolism.  There was continued subpleural nodularity and thickening anteriorly in the lingular segment of left upper lobe which may be increased compared to prior exam, suggesting worsening inflammation. Grossly stable semi solid density seen centrally in right upper lobe. Adenocarcinoma cannot be excluded. Follow up by CT is recommended in 12 months, with continued annual surveillance for a minimum of 3 years.  She had worsening iron deficiency, so was given IV iron in the hospital.  We have continued this as an outpatient and she completed 5 doses of iron sucrose on 12/19.  She has been back to Dr. Mitzi Hansen and completed SBRT to the right upper lobe mass. She has pain of the right lateral chest wall likely musculoskeletal in origin. If this does not resolve she will contact us.  We will otherwise see her back in 1 month with a CBC and comprehensive metabolic panel.   Breast cancer of upper-outer quadrant of left female breast (HCC) Stage IB triple negative breast cancer. She had difficulty tolerating chemotherapy with docetaxel/cyclophosphamide despite dose reduction.  She completed chemotherapy in December 2023.  She intermittently required IV fluids after completion of chemotherapy.  She received adjuvant radiation to the left breast completed in very 2024.  Bilateral diagnostic mammogram in June did not reveal any evidence of malignancy.  She remains without evidence of recurrence.     Iron deficiency anemia Iron deficiency anemia. She did not tolerate oral  iron. She received IV iron when hospitalized earlier this month. We continued this as an outpatient and she completed 5 doses of iron sucrose yesterday.   Plan:    The patient and her daughter understand the plans discussed today and are in agreement with them.   They know  to contact our office if she develops concerns prior to her next appointment.   I provided 30 minutes of face-to-face time during this encounter and > 50% was spent counseling as documented under my assessment and plan.    Dellia Beckwith, MD  [facility] - A DEPT OF [facility] [address] [place] [address] Dept: [phone] Dept Fax: [phone]   No orders of the defined types were placed in this encounter.     CHIEF COMPLAINT:  CC: Stage IA3 non-small cell lung cancer  Current Treatment: Completed SBRT  HISTORY OF PRESENT ILLNESS:   Oncology History  Breast cancer of upper-outer quadrant of left female breast (HCC)  03/18/2022 Initial Diagnosis   Breast cancer of upper-outer quadrant of left female breast (HCC)   04/28/2022 Cancer Staging   Staging form: Breast, AJCC 8th Edition - Clinical stage from 04/28/2022: Stage IB (cT1c, cN0(sn), cM0, G3, ER-, PR-, HER2-) - Signed by Dellia Beckwith, MD on 04/28/2022 Histopathologic type: Infiltrating duct carcinoma, NOS Stage prefix: Initial diagnosis Method of lymph node assessment: Sentinel lymph node biopsy Nuclear grade: G3 Multigene prognostic tests performed: None Histologic grading system: 3 grade system Laterality: Left Tumor size (mm): 20 Lymph-vascular invasion (LVI): LVI not present (absent)/not identified Diagnostic confirmation: Positive histology Specimen type: Excision Staged by: Managing physician Menopausal status: Postmenopausal Ki-67 (%): 40 Stage used in treatment planning: Yes National guidelines used in treatment planning: Yes Type of national guideline used in treatment planning: NCCN   05/07/2022 - 07/14/2022 Chemotherapy   Patient is on Treatment Plan : BREAST TC q21d     05/19/2022 Genetic Testing   Negative hereditary cancer genetic testing: no pathogenic variant detected in [facility] Common Hereditary Cancers +RNA  Panel.  Report date is May 19, 2022.    The Common Hereditary Cancers + RNA Panel offered by [facility] includes sequencing, deletion/duplication, and RNA testing of the following 47 genes: APC, ATM, AXIN2, BARD1, BMPR1A, BRCA1, BRCA2, BRIP1, CDH1, CDK4*, CDKN2A (p14ARF)*, CDKN2A (p16INK4a)*, CHEK2, CTNNA1, DICER1, EPCAM (Deletion/duplication testing only), GREM1 (promoter region deletion/duplication testing only), KIT, MEN1, MLH1, MSH2, MSH3, MSH6, MUTYH, NBN, NF1, NHTL1, PALB2, PDGFRA*, PMS2, POLD1, POLE, PTEN, RAD50, RAD51C, RAD51D, SDHB, SDHC, SDHD, SMAD4, SMARCA4. STK11, TP53, TSC1, TSC2, and VHL.  The following genes were evaluated for sequence changes only: SDHA and HOXB13 c.251G>A variant only.  RNA analysis is not performed for the * genes.     Primary cancer of right upper lobe of lung (HCC)  04/19/2023 Cancer Staging   Staging form: Lung, AJCC 8th Edition - Clinical stage from 04/19/2023: Stage IA3 (cT1c, cN0, cM0) - Signed by Dellia Beckwith, MD on 05/23/2023 Histopathologic type: Carcinoma, NOS Stage prefix: Initial diagnosis Laterality: Right Tumor size (mm): 25 Lymph-vascular invasion (LVI): Presence of LVI unknown/indeterminate Diagnostic confirmation: Positive cytology, no positive histology Specimen type: Bronchial Biopsy Staged by: Managing physician Type of lung cancer: Resectable non-small cell lung cancer in inoperable patients treated with radiotherapy ECOG performance status: Grade 1 Symptoms: Absent Prognostic indicators: Non small cell lung cancer Stage used in treatment planning: Yes National guidelines used in treatment planning: Yes Type of national guideline used  in treatment planning: NCCN   05/23/2023 Initial Diagnosis   Primary cancer of right upper lobe of lung (HCC)       INTERVAL HISTORY:  Lindsay Brooks is here today for repeat clinical assessment for her stage IA3 non-small cell lung cancer. Patient states that she feels *** and ***.      She  denies signs of infection such as sore throat, sinus drainage, cough, or urinary symptoms.  She denies fevers or recurrent chills. She denies pain. She denies nausea, vomiting, chest pain, dyspnea or cough. Her appetite is *** and her weight {Weight change:10426}.   She states she has been feeling better since receiving IV iron. She reports shortness of breath with exertion and cough prod creamy sputum.She also reports night sweats about the head and neck. She continues to have episodes of syncope. She denies fevers or chills. She reports pain of the right lateral chest wall. Her appetite is decreased. Her weight has decreased 2 pounds over last 2 months .   REVIEW OF SYSTEMS:  Review of Systems  Constitutional:  Positive for diaphoresis. Negative for appetite change, chills, fatigue, fever and unexpected weight change.  HENT:   Negative for lump/mass, mouth sores, nosebleeds, sore throat and trouble swallowing.   Respiratory:  Positive for cough and shortness of breath (chronic, on O2 as needed). Negative for hemoptysis.   Cardiovascular:  Negative for chest pain, leg swelling and palpitations.  Gastrointestinal:  Negative for abdominal pain, blood in stool, constipation, diarrhea, nausea and vomiting.  Endocrine: Negative for hot flashes.  Genitourinary:  Negative for difficulty urinating, dysuria, frequency, hematuria, vaginal bleeding and vaginal discharge.   Musculoskeletal:  Negative for arthralgias, back pain, gait problem, myalgias and neck pain.  Skin:  Negative for itching and rash.  Neurological:  Positive for numbness (extremities). Negative for dizziness, extremity weakness, gait problem, headaches and light-headedness.  Hematological:  Negative for adenopathy. Does not bruise/bleed easily.  Psychiatric/Behavioral:  Negative for depression and sleep disturbance. The patient is not nervous/anxious.      VITALS:  There were no vitals taken for this visit.  Wt Readings from Last 3  Encounters:  07/30/23 119 lb (54 kg)  07/13/23 122 lb 12.7 oz (55.7 kg)  06/15/23 118 lb 8 oz (53.8 kg)    There is no height or weight on file to calculate BMI.  Performance status (ECOG): 2 - Symptomatic, <50% confined to bed  PHYSICAL EXAM:  Physical Exam Vitals and nursing note reviewed.  Constitutional:      General: She is not in acute distress.    Appearance: Normal appearance.  HENT:     Head: Normocephalic and atraumatic.     Mouth/Throat:     Mouth: Mucous membranes are moist.     Pharynx: Oropharynx is clear. No oropharyngeal exudate or posterior oropharyngeal erythema.  Eyes:     General: No scleral icterus.    Extraocular Movements: Extraocular movements intact.     Conjunctiva/sclera: Conjunctivae normal.     Pupils: Pupils are equal, round, and reactive to light.  Cardiovascular:     Rate and Rhythm: Normal rate and regular rhythm.     Heart sounds: Normal heart sounds. No murmur heard.    No friction rub. No gallop.  Pulmonary:     Effort: Pulmonary effort is normal.     Breath sounds: No wheezing, rhonchi or rales. Decreased breath sounds: throughout. Chest:     Chest wall: Tenderness (right lateral chest wall without mass) present.  Comments: Breast exam is deferred. Abdominal:     General: There is no distension.     Palpations: Abdomen is soft. There is no hepatomegaly, splenomegaly or mass.     Tenderness: There is no abdominal tenderness.  Musculoskeletal:        General: Normal range of motion.     Cervical back: Normal range of motion and neck supple. No tenderness.     Right lower leg: No edema.     Left lower leg: No edema.  Lymphadenopathy:     Cervical: No cervical adenopathy.     Upper Body:     Right upper body: No supraclavicular or axillary adenopathy.     Left upper body: No supraclavicular or axillary adenopathy.     Lower Body: No right inguinal adenopathy. No left inguinal adenopathy.  Skin:    General: Skin is warm and dry.      Coloration: Skin is not jaundiced.     Findings: No rash.  Neurological:     Mental Status: She is alert and oriented to person, place, and time.     Cranial Nerves: No cranial nerve deficit.  Psychiatric:        Mood and Affect: Mood normal.        Behavior: Behavior normal.        Thought Content: Thought content normal.     LABS:      Latest Ref Rng & Units 07/30/2023    2:05 PM 07/15/2023    4:25 AM 07/14/2023    4:03 AM  CBC  WBC 4.0 - 10.5 K/uL 6.5  4.5  4.4   Hemoglobin 12.0 - 15.0 g/dL 40.9  7.7  7.8   Hematocrit 36.0 - 46.0 % 32.5  25.4  27.4   Platelets 150 - 400 K/uL 271  154  174       Latest Ref Rng & Units 07/30/2023    2:05 PM 07/15/2023    4:25 AM 07/14/2023    4:03 AM  CMP  Glucose 70 - 99 mg/dL 811  914  76   BUN 8 - 23 mg/dL 13  18  22    Creatinine 0.44 - 1.00 mg/dL 7.82  9.56  2.13   Sodium 135 - 145 mmol/L 144  140  139   Potassium 3.5 - 5.1 mmol/L 3.2  3.7  4.0   Chloride 98 - 111 mmol/L 108  112  111   CO2 22 - 32 mmol/L 24  24  21    Calcium 8.9 - 10.3 mg/dL 9.2  8.2  8.1   Total Protein 6.5 - 8.1 g/dL 6.4  5.2    Total Bilirubin <1.2 mg/dL <0.8  0.4    Alkaline Phos 38 - 126 U/L 60  50    AST 15 - 41 U/L 17  13    ALT 0 - 44 U/L <5  7       No results found for: "CEA1", "CEA" / No results found for: "CEA1", "CEA" No results found for: "PSA1" No results found for: "MVH846" No results found for: "CAN125"  No results found for: "TOTALPROTELP", "ALBUMINELP", "A1GS", "A2GS", "BETS", "BETA2SER", "GAMS", "MSPIKE", "SPEI" Lab Results  Component Value Date   TIBC 553 (H) 06/03/2023   TIBC 420 04/09/2023   TIBC 361 10/20/2022   FERRITIN 3 (L) 06/03/2023   FERRITIN 11 10/20/2022   IRONPCTSAT 4 (L) 06/03/2023   IRONPCTSAT 8 (L) 04/09/2023   IRONPCTSAT 27 10/20/2022   No results found for: "  LDH"  STUDIES:  No results found.     HISTORY:   Past Medical History:  Diagnosis Date   Anemia    Arthritis    CAD (coronary artery disease)     a. s/p 2 CoStar DES to RCA 06/2004. b.  LHC 12/09:  EF 65%, oCFX 40%, oRCA 30%, then 30-40% leading into stented seg that is patent, (10-20% ISR), 40% beyond stent,;  c. EF 81%, poor exercise capacity, hypotensive BP response, + chest pain, normal nuclear images (overall low risk)   CKD (chronic kidney disease)    Colon polyps    a. s/p removal 01/2012 - was told one might be cancerous but was not diagnosed with colon cancer - plan is for f/u colonoscopy in 5 yrs.   COPD (chronic obstructive pulmonary disease) (HCC)    Depression    GERD (gastroesophageal reflux disease)    Hx of echocardiogram    a. Echo 06/2011: EF 55-60%, Gr 1 diast dysfn, mild MR   Insomnia    Mini stroke    History of, in 2005 (before she was diagnosed with CAD).   Osteoporosis    Other and unspecified hyperlipidemia    Other isolated or specific phobias    Peripheral vascular disease, unspecified (HCC)    a. s/p L common femoral & left illiac stenting previously (patent by angio in 2007). - ABI 06/2011 normal. b. Carotid US 0-39% BICA 06/2011 (for f/u 2 yrs);   c.  ABIs 9/13:  normal (1.1 bilaterally)   Personal history of peptic ulcer disease    Remotely.   Psychiatric pseudoseizure    per patient and family   TIA (transient ischemic attack)    Tobacco use disorder    Type II or unspecified type diabetes mellitus without mention of complication, not stated as uncontrolled    Unspecified essential hypertension     Past Surgical History:  Procedure Laterality Date   APPENDECTOMY     BREAST BIOPSY     BRONCHIAL BIOPSY  04/19/2023   Procedure: BRONCHIAL BIOPSIES;  Surgeon: Leslye Peer, MD;  Location: [facility] ENDOSCOPY;  Service: Pulmonary;;   BRONCHIAL BRUSHINGS  04/19/2023   Procedure: BRONCHIAL BRUSHINGS;  Surgeon: Leslye Peer, MD;  Location[facility] ENDOSCOPY;  Service: Pulmonary;;   BRONCHIAL NEEDLE ASPIRATION BIOPSY  04/19/2023   Procedure: BRONCHIAL NEEDLE ASPIRATION BIOPSIES;  Surgeon: Leslye Peer, MD;   Location: [facility] ENDOSCOPY;  Service: Pulmonary;;   BRONCHIAL WASHINGS  04/19/2023   Procedure: BRONCHIAL WASHINGS;  Surgeon: Leslye Peer, MD;  Location: [facility] ENDOSCOPY;  Service: Pulmonary;;   CARDIAC CATHETERIZATION  2009   patent RCA stent. mild CAD otherwise.    CATARACT EXTRACTION, BILATERAL  2016   CHOLECYSTECTOMY  2009   FIDUCIAL MARKER PLACEMENT  04/19/2023   Procedure: FIDUCIAL MARKER PLACEMENT;  Surgeon: Leslye Peer, MD;  Location: [facility] ENDOSCOPY;  Service: Pulmonary;;   FLEXIBLE SIGMOIDOSCOPY N/A 10/21/2022   Procedure: Arnell Sieving;  Surgeon: Charna Elizabeth, MD;  Location: [facility] ENDOSCOPY;  Service: Gastroenterology;  Laterality: N/A;   hysterectomy (other)     age 73   KNEE ARTHROSCOPY Left    left arm surgery     LEFT HEART CATHETERIZATION WITH CORONARY ANGIOGRAM N/A 01/16/2014   Procedure: LEFT HEART CATHETERIZATION WITH CORONARY ANGIOGRAM;  Surgeon: Micheline Chapman, MD;  Location[facility] CATH LAB;  Service: Cardiovascular;  Laterality: N/A;   left leg angiography     stenting of the left common iliac artery   left rotator cuff  repair     right coronary artery stent      Family History  Problem Relation Age of Onset   Heart attack Mother    Allergies Mother    Heart attack Father    Allergies Father    Kidney disease Father    Hypertension Father    Melanoma Sister    Coronary artery disease Brother    Breast cancer Maternal Aunt    Breast cancer Maternal Aunt    Breast cancer Paternal Aunt        d. under 43   Pancreatic cancer Maternal Grandmother    Breast cancer Daughter        BRCA1+    Social History:  reports that she has been smoking cigarettes. She has a 66 pack-year smoking history. She has never used smokeless tobacco. She reports that she does not drink alcohol and does not use drugs.The patient is accompanied by her daughter today.  Allergies:  Allergies  Allergen Reactions   Amoxicillin Hives and Nausea And Vomiting   Claritin [Loratadine]  Other (See Comments)    Pt's daughter notified us of this allergy (reaction?)   Insulins Nausea And Vomiting and Other (See Comments)    Patient unsure of name of insulin   Vioxx [Rofecoxib] Hives   Celecoxib Hives and Nausea And Vomiting   Liraglutide Nausea And Vomiting and Other (See Comments)    Victoza   Propranolol Diarrhea and Nausea And Vomiting    Current Medications: Current Outpatient Medications  Medication Sig Dispense Refill   acetaminophen (TYLENOL) 500 MG tablet Take 1,000 mg by mouth as needed for moderate pain.     ALPRAZolam (XANAX) 0.25 MG tablet Take 1 tablet (0.25 mg total) by mouth 2 (two) times daily as needed for anxiety. 20 tablet 0   aspirin EC 81 MG tablet Take 1 tablet (81 mg total) by mouth daily. 30 tablet 11   busPIRone (BUSPAR) 7.5 MG tablet Take 1 tablet (7.5 mg total) by mouth daily as needed (for anxiety). 30 tablet 0   clopidogrel (PLAVIX) 75 MG tablet Take 1 tablet (75 mg total) by mouth daily. 30 tablet 0   divalproex (DEPAKOTE) 250 MG DR tablet Take 1 tablet (250 mg total) by mouth 2 (two) times daily. 30 tablet 0   donepezil (ARICEPT) 5 MG tablet Take 5 mg by mouth daily.     DULoxetine (CYMBALTA) 60 MG capsule Take 60 mg by mouth at bedtime.     ezetimibe (ZETIA) 10 MG tablet Take 10 mg by mouth daily.     famotidine (PEPCID) 20 MG tablet Take 20 mg by mouth 2 (two) times daily.     ferrous sulfate 325 (65 FE) MG tablet Take 325 mg by mouth daily with breakfast. (Patient not taking: Reported on 07/30/2023)     hydrALAZINE (APRESOLINE) 50 MG tablet Take 50 mg by mouth in the morning and at bedtime.     ipratropium-albuterol (DUONEB) 0.5-2.5 (3) MG/3ML SOLN Take 3 mLs by nebulization every 6 (six) hours as needed (for shortness of breath or wheezing).     JARDIANCE 25 MG TABS tablet Take 1 tablet (25 mg total) by mouth daily. 30 tablet 2   metoprolol tartrate (LOPRESSOR) 25 MG tablet Take 25 mg by mouth 2 (two) times daily.     nitroGLYCERIN  (NITROSTAT) 0.4 MG SL tablet Place 0.4 mg under the tongue every 5 (five) minutes as needed for chest pain.     Potassium Chloride ER 20 MEQ  TBCR Take 2 tablets by mouth daily.     PROLIA 60 MG/ML SOSY injection Inject 60 mg into the skin every 6 (six) months.     XIFAXAN 550 MG TABS tablet Take 550 mg by mouth 3 (three) times daily.     zonisamide (ZONEGRAN) 100 MG capsule Take 200 mg by mouth at bedtime.     No current facility-administered medications for this visit.    I,Jasmine M Lassiter,acting as a scribe for Dellia Beckwith, MD.,have documented all relevant documentation on the behalf of Dellia Beckwith, MD,as directed by  Dellia Beckwith, MD while in the presence of Dellia Beckwith, MD.

## 2023-09-11 DIAGNOSIS — E785 Hyperlipidemia, unspecified: Secondary | ICD-10-CM | POA: Diagnosis not present

## 2023-09-11 DIAGNOSIS — E1169 Type 2 diabetes mellitus with other specified complication: Secondary | ICD-10-CM | POA: Diagnosis not present

## 2023-09-22 DIAGNOSIS — R0603 Acute respiratory distress: Secondary | ICD-10-CM | POA: Diagnosis not present

## 2023-09-22 DIAGNOSIS — U071 COVID-19: Secondary | ICD-10-CM | POA: Diagnosis not present

## 2023-09-29 NOTE — Progress Notes (Incomplete)
[facility]  [address] [place],  [place]  [address] [phone]  Clinic Day:  09/29/23   Referring physician: [name], MD  ASSESSMENT & PLAN:   Assessment & Plan: Lung cancer, upper lobe [facility]) New diagnosis of non-small cell lung cancer of the right upper lobe. Clinical stage IB (T1b N0 M0).  CT chest, abdomen and pelvis done in the ER July 22 after a fall revealed a 25 x 22 mm right upper lobe groundglass nodule increased in size from September 2023.  There were central lobar micronodules in the right upper lobe, lingula and right middle lobe.  PET on August 16 revealed 2.5 cm subsolid nodule in the central right upper lobe, suspicious for primary bronchogenic carcinoma, specifically semi invasive adenocarcinoma. No findings suspicious for metastatic disease. Radiation changes involving the left breast and anterior left upper lobe. Multiple left anterolateral rib fracture deformities, including a mildly displaced left lateral 9th rib. Hypermetabolic 12 mm right isthmic thyroid nodule. Recommend thyroid US and biopsy. She was referred to Dr. Delton Coombes and underwent bronchoscopy on September 9.  Brushings revealed malignant cells consistent with non-small cell lung cancer.  Dr. Delton Coombes recommended an MRI head and PET. MRI brain did not reveal any evidence of intercranial metastasis. Mild chronic small vessel ischemic disease was seen.   She was referred to Dr. Mitzi Hansen for consideration of SBRT for her lung cancer. She was then admitted December 2 with altered mental status. This was felt to be due to pseudoseizure, which she had evaluated in 2022.   Neurology was consulted.  EEG was normal.  MRI brain during her hospitalization did not reveal any evidence of intracranial abnormality. Psychiatry referral as an outpatient was recommended to standardize meds-again has polypharmacy and was not probably taking all the meds she needs to-given a very low dose of Xanax twice daily given  functional nature of her disorder        Echocardiogram revealed grade I diastolic dysfunction with an ejection fraction of 70-75%.  CTA chest did not reveal any evidence of pulmonary embolism.  There was continued subpleural nodularity and thickening anteriorly in the lingular segment of left upper lobe which may be increased compared to prior exam, suggesting worsening inflammation. Grossly stable semi solid density seen centrally in right upper lobe. Adenocarcinoma cannot be excluded. Follow up by CT is recommended in 12 months, with continued annual surveillance for a minimum of 3 years.  She had worsening iron deficiency, so was given IV iron in the hospital.  We have continued this as an outpatient and she completed 5 doses of iron sucrose on 12/19.  She has been back to Dr. Mitzi Hansen and completed SBRT to the right upper lobe mass. She has pain of the right lateral chest wall likely musculoskeletal in origin. If this does not resolve she will contact us.  We will otherwise see her back in 1 month with a CBC and comprehensive metabolic panel.   Breast cancer of upper-outer quadrant of left female breast (HCC) Stage IB triple negative breast cancer. She had difficulty tolerating chemotherapy with docetaxel/cyclophosphamide despite dose reduction.  She completed chemotherapy in December 2023.  She intermittently required IV fluids after completion of chemotherapy.  She received adjuvant radiation to the left breast completed in very 2024.  Bilateral diagnostic mammogram in June did not reveal any evidence of malignancy.  She remains without evidence of recurrence.   Iron deficiency anemia Iron deficiency anemia. She did not tolerate oral iron. She received  IV iron when hospitalized earlier this month. We continued this as an outpatient and she completed 5 doses of iron sucrose yesterday.   Plan:  The patient and her daughter understand the plans discussed today and are in agreement with them.  They know   to contact our office if she develops concerns prior to her next appointment.   I provided 30 minutes of face-to-face time during this encounter and > 50% was spent counseling as documented under my assessment and plan.   Dellia Beckwith, MD   [facility] - A DEPT OF [facility] [address] [place] [address] Dept: [phone] Dept Fax: [phone]   No orders of the defined types were placed in this encounter.   CHIEF COMPLAINT:  CC: Stage IA3 non-small cell lung cancer  Current Treatment: Completed SBRT  HISTORY OF PRESENT ILLNESS:   Oncology History  Breast cancer of upper-outer quadrant of left female breast (HCC)  03/18/2022 Initial Diagnosis   Breast cancer of upper-outer quadrant of left female breast (HCC)   04/28/2022 Cancer Staging   Staging form: Breast, AJCC 8th Edition - Clinical stage from 04/28/2022: Stage IB (cT1c, cN0(sn), cM0, G3, ER-, PR-, HER2-) - Signed by Dellia Beckwith, MD on 04/28/2022 Histopathologic type: Infiltrating duct carcinoma, NOS Stage prefix: Initial diagnosis Method of lymph node assessment: Sentinel lymph node biopsy Nuclear grade: G3 Multigene prognostic tests performed: None Histologic grading system: 3 grade system Laterality: Left Tumor size (mm): 20 Lymph-vascular invasion (LVI): LVI not present (absent)/not identified Diagnostic confirmation: Positive histology Specimen type: Excision Staged by: Managing physician Menopausal status: Postmenopausal Ki-67 (%): 40 Stage used in treatment planning: Yes National guidelines used in treatment planning: Yes Type of national guideline used in treatment planning: NCCN   05/07/2022 - 07/14/2022 Chemotherapy   Patient is on Treatment Plan : BREAST TC q21d     05/19/2022 Genetic Testing   Negative hereditary cancer genetic testing: no pathogenic variant detected in [facility] Common Hereditary Cancers +RNA Panel.  Report  date is May 19, 2022.    The Common Hereditary Cancers + RNA Panel offered by [facility] includes sequencing, deletion/duplication, and RNA testing of the following 47 genes: APC, ATM, AXIN2, BARD1, BMPR1A, BRCA1, BRCA2, BRIP1, CDH1, CDK4*, CDKN2A (p14ARF)*, CDKN2A (p16INK4a)*, CHEK2, CTNNA1, DICER1, EPCAM (Deletion/duplication testing only), GREM1 (promoter region deletion/duplication testing only), KIT, MEN1, MLH1, MSH2, MSH3, MSH6, MUTYH, NBN, NF1, NHTL1, PALB2, PDGFRA*, PMS2, POLD1, POLE, PTEN, RAD50, RAD51C, RAD51D, SDHB, SDHC, SDHD, SMAD4, SMARCA4. STK11, TP53, TSC1, TSC2, and VHL.  The following genes were evaluated for sequence changes only: SDHA and HOXB13 c.251G>A variant only.  RNA analysis is not performed for the * genes.     Primary cancer of right upper lobe of lung (HCC)  04/19/2023 Cancer Staging   Staging form: Lung, AJCC 8th Edition - Clinical stage from 04/19/2023: Stage IA3 (cT1c, cN0, cM0) - Signed by Dellia Beckwith, MD on 05/23/2023 Histopathologic type: Carcinoma, NOS Stage prefix: Initial diagnosis Laterality: Right Tumor size (mm): 25 Lymph-vascular invasion (LVI): Presence of LVI unknown/indeterminate Diagnostic confirmation: Positive cytology, no positive histology Specimen type: Bronchial Biopsy Staged by: Managing physician Type of lung cancer: Resectable non-small cell lung cancer in inoperable patients treated with radiotherapy ECOG performance status: Grade 1 Symptoms: Absent Prognostic indicators: Non small cell lung cancer Stage used in treatment planning: Yes National guidelines used in treatment planning: Yes Type of national guideline used in treatment planning: NCCN   05/23/2023  Initial Diagnosis   Primary cancer of right upper lobe of lung (HCC)       INTERVAL HISTORY:  Meghanne is here today for repeat clinical assessment for stage IA3 non-small cell lung cancer. Patient states that she feels *** and ***.     She denies signs of infection  such as sore throat, sinus drainage, cough, or urinary symptoms.  She denies fevers or recurrent chills. She denies pain. She denies nausea, vomiting, chest pain, dyspnea or cough. Her appetite is *** and her weight {Weight change:10426}.   She states she has been feeling better since receiving IV iron. She reports shortness of breath with exertion and cough prod creamy sputum.She also reports night sweats about the head and neck. She continues to have episodes of syncope. She denies fevers or chills. She reports pain of the right lateral chest wall. Her appetite is decreased. Her weight has decreased 2 pounds over last 2 months .   REVIEW OF SYSTEMS:  Review of Systems  Constitutional:  Positive for diaphoresis. Negative for appetite change, chills, fatigue, fever and unexpected weight change.  HENT:  Negative.  Negative for lump/mass, mouth sores, nosebleeds, sore throat and trouble swallowing.   Eyes: Negative.   Respiratory:  Positive for cough and shortness of breath (chronic, on O2 as needed). Negative for chest tightness, hemoptysis and wheezing.   Cardiovascular: Negative.  Negative for chest pain, leg swelling and palpitations.  Gastrointestinal: Negative.  Negative for abdominal distention, abdominal pain, blood in stool, constipation, diarrhea, nausea and vomiting.  Endocrine: Negative.  Negative for hot flashes.  Genitourinary: Negative.  Negative for difficulty urinating, dysuria, frequency, hematuria, vaginal bleeding and vaginal discharge.   Musculoskeletal:  Negative for arthralgias, back pain, flank pain, gait problem, myalgias and neck pain.  Skin: Negative.  Negative for itching and rash.  Neurological:  Positive for numbness (extremities). Negative for dizziness, extremity weakness, gait problem, headaches, light-headedness, seizures and speech difficulty.  Hematological: Negative.  Negative for adenopathy. Does not bruise/bleed easily.  Psychiatric/Behavioral: Negative.   Negative for depression and sleep disturbance. The patient is not nervous/anxious.      VITALS:  There were no vitals taken for this visit.  Wt Readings from Last 3 Encounters:  07/30/23 119 lb (54 kg)  07/13/23 122 lb 12.7 oz (55.7 kg)  06/15/23 118 lb 8 oz (53.8 kg)    There is no height or weight on file to calculate BMI.  Performance status (ECOG): 2 - Symptomatic, <50% confined to bed  PHYSICAL EXAM:  Physical Exam Vitals and nursing note reviewed.  Constitutional:      General: She is not in acute distress.    Appearance: Normal appearance.  HENT:     Head: Normocephalic and atraumatic.     Mouth/Throat:     Mouth: Mucous membranes are moist.     Pharynx: Oropharynx is clear. No oropharyngeal exudate or posterior oropharyngeal erythema.  Eyes:     General: No scleral icterus.    Extraocular Movements: Extraocular movements intact.     Conjunctiva/sclera: Conjunctivae normal.     Pupils: Pupils are equal, round, and reactive to light.  Cardiovascular:     Rate and Rhythm: Normal rate and regular rhythm.     Heart sounds: Normal heart sounds. No murmur heard.    No friction rub. No gallop.  Pulmonary:     Effort: Pulmonary effort is normal.     Breath sounds: No wheezing, rhonchi or rales. Decreased breath sounds: throughout. Chest:  Chest wall: Tenderness (right lateral chest wall without mass) present.     Comments: Breast exam is deferred. Abdominal:     General: There is no distension.     Palpations: Abdomen is soft. There is no hepatomegaly, splenomegaly or mass.     Tenderness: There is no abdominal tenderness.  Musculoskeletal:        General: Normal range of motion.     Cervical back: Normal range of motion and neck supple. No tenderness.     Right lower leg: No edema.     Left lower leg: No edema.  Lymphadenopathy:     Cervical: No cervical adenopathy.     Upper Body:     Right upper body: No supraclavicular or axillary adenopathy.     Left upper  body: No supraclavicular or axillary adenopathy.     Lower Body: No right inguinal adenopathy. No left inguinal adenopathy.  Skin:    General: Skin is warm and dry.     Coloration: Skin is not jaundiced.     Findings: No rash.  Neurological:     Mental Status: She is alert and oriented to person, place, and time.     Cranial Nerves: No cranial nerve deficit.  Psychiatric:        Mood and Affect: Mood normal.        Behavior: Behavior normal.        Thought Content: Thought content normal.     LABS:      Latest Ref Rng & Units 07/30/2023    2:05 PM 07/15/2023    4:25 AM 07/14/2023    4:03 AM  CBC  WBC 4.0 - 10.5 K/uL 6.5  4.5  4.4   Hemoglobin 12.0 - 15.0 g/dL 40.9  7.7  7.8   Hematocrit 36.0 - 46.0 % 32.5  25.4  27.4   Platelets 150 - 400 K/uL 271  154  174       Latest Ref Rng & Units 07/30/2023    2:05 PM 07/15/2023    4:25 AM 07/14/2023    4:03 AM  CMP  Glucose 70 - 99 mg/dL 811  914  76   BUN 8 - 23 mg/dL 13  18  22    Creatinine 0.44 - 1.00 mg/dL 7.82  9.56  2.13   Sodium 135 - 145 mmol/L 144  140  139   Potassium 3.5 - 5.1 mmol/L 3.2  3.7  4.0   Chloride 98 - 111 mmol/L 108  112  111   CO2 22 - 32 mmol/L 24  24  21    Calcium 8.9 - 10.3 mg/dL 9.2  8.2  8.1   Total Protein 6.5 - 8.1 g/dL 6.4  5.2    Total Bilirubin <1.2 mg/dL <0.8  0.4    Alkaline Phos 38 - 126 U/L 60  50    AST 15 - 41 U/L 17  13    ALT 0 - 44 U/L <5  7       No results found for: "CEA1", "CEA" / No results found for: "CEA1", "CEA" No results found for: "PSA1" No results found for: "MVH846" No results found for: "CAN125"  No results found for: "TOTALPROTELP", "ALBUMINELP", "A1GS", "A2GS", "BETS", "BETA2SER", "GAMS", "MSPIKE", "SPEI" Lab Results  Component Value Date   TIBC 553 (H) 06/03/2023   TIBC 420 04/09/2023   TIBC 361 10/20/2022   FERRITIN 3 (L) 06/03/2023   FERRITIN 11 10/20/2022   IRONPCTSAT 4 (L) 06/03/2023   IRONPCTSAT  8 (L) 04/09/2023   IRONPCTSAT 27 10/20/2022   No  results found for: "LDH"  STUDIES:  No results found.     HISTORY:   Past Medical History:  Diagnosis Date   Anemia    Arthritis    CAD (coronary artery disease)    a. s/p 2 CoStar DES to RCA 06/2004. b.  LHC 12/09:  EF 65%, oCFX 40%, oRCA 30%, then 30-40% leading into stented seg that is patent, (10-20% ISR), 40% beyond stent,;  c. EF 81%, poor exercise capacity, hypotensive BP response, + chest pain, normal nuclear images (overall low risk)   CKD (chronic kidney disease)    Colon polyps    a. s/p removal 01/2012 - was told one might be cancerous but was not diagnosed with colon cancer - plan is for f/u colonoscopy in 5 yrs.   COPD (chronic obstructive pulmonary disease) (HCC)    Depression    GERD (gastroesophageal reflux disease)    Hx of echocardiogram    a. Echo 06/2011: EF 55-60%, Gr 1 diast dysfn, mild MR   Insomnia    Mini stroke    History of, in 2005 (before she was diagnosed with CAD).   Osteoporosis    Other and unspecified hyperlipidemia    Other isolated or specific phobias    Peripheral vascular disease, unspecified (HCC)    a. s/p L common femoral & left illiac stenting previously (patent by angio in 2007). - ABI 06/2011 normal. b. Carotid US 0-39% BICA 06/2011 (for f/u 2 yrs);   c.  ABIs 9/13:  normal (1.1 bilaterally)   Personal history of peptic ulcer disease    Remotely.   Psychiatric pseudoseizure    per patient and family   TIA (transient ischemic attack)    Tobacco use disorder    Type II or unspecified type diabetes mellitus without mention of complication, not stated as uncontrolled    Unspecified essential hypertension     Past Surgical History:  Procedure Laterality Date   APPENDECTOMY     BREAST BIOPSY     BRONCHIAL BIOPSY  04/19/2023   Procedure: BRONCHIAL BIOPSIES;  Surgeon: Leslye Peer, MD;  Location: [facility] ENDOSCOPY;  Service: Pulmonary;;   BRONCHIAL BRUSHINGS  04/19/2023   Procedure: BRONCHIAL BRUSHINGS;  Surgeon: Leslye Peer, MD;   Location: [facility] ENDOSCOPY;  Service: Pulmonary;;   BRONCHIAL NEEDLE ASPIRATION BIOPSY  04/19/2023   Procedure: BRONCHIAL NEEDLE ASPIRATION BIOPSIES;  Surgeon: Leslye Peer, MD;  Location: [facility] ENDOSCOPY;  Service: Pulmonary;;   BRONCHIAL WASHINGS  04/19/2023   Procedure: BRONCHIAL WASHINGS;  Surgeon: Leslye Peer, MD;  Location: [facility] ENDOSCOPY;  Service: Pulmonary;;   CARDIAC CATHETERIZATION  2009   patent RCA stent. mild CAD otherwise.    CATARACT EXTRACTION, BILATERAL  2016   CHOLECYSTECTOMY  2009   FIDUCIAL MARKER PLACEMENT  04/19/2023   Procedure: FIDUCIAL MARKER PLACEMENT;  Surgeon: Leslye Peer, MD;  Location[facility] ENDOSCOPY;  Service: Pulmonary;;   FLEXIBLE SIGMOIDOSCOPY N/A 10/21/2022   Procedure: Arnell Sieving;  Surgeon: Charna Elizabeth, MD;  Location: [facility] ENDOSCOPY;  Service: Gastroenterology;  Laterality: N/A;   hysterectomy (other)     age 8   KNEE ARTHROSCOPY Left    left arm surgery     LEFT HEART CATHETERIZATION WITH CORONARY ANGIOGRAM N/A 01/16/2014   Procedure: LEFT HEART CATHETERIZATION WITH CORONARY ANGIOGRAM;  Surgeon: Micheline Chapman, MD;  Location[facility] CATH LAB;  Service: Cardiovascular;  Laterality: N/A;   left leg angiography  stenting of the left common iliac artery   left rotator cuff repair     right coronary artery stent      Family History  Problem Relation Age of Onset   Heart attack Mother    Allergies Mother    Heart attack Father    Allergies Father    Kidney disease Father    Hypertension Father    Melanoma Sister    Coronary artery disease Brother    Breast cancer Maternal Aunt    Breast cancer Maternal Aunt    Breast cancer Paternal Aunt        d. under 17   Pancreatic cancer Maternal Grandmother    Breast cancer Daughter        BRCA1+    Social History:  reports that she has been smoking cigarettes. She has a 66 pack-year smoking history. She has never used smokeless tobacco. She reports that she does not drink alcohol and does not  use drugs.The patient is accompanied by her daughter today.  Allergies:  Allergies  Allergen Reactions   Amoxicillin Hives and Nausea And Vomiting   Claritin [Loratadine] Other (See Comments)    Pt's daughter notified us of this allergy (reaction?)   Insulins Nausea And Vomiting and Other (See Comments)    Patient unsure of name of insulin   Vioxx [Rofecoxib] Hives   Celecoxib Hives and Nausea And Vomiting   Liraglutide Nausea And Vomiting and Other (See Comments)    Victoza   Propranolol Diarrhea and Nausea And Vomiting   Current Medications: Current Outpatient Medications  Medication Sig Dispense Refill   acetaminophen (TYLENOL) 500 MG tablet Take 1,000 mg by mouth as needed for moderate pain.     ALPRAZolam (XANAX) 0.25 MG tablet Take 1 tablet (0.25 mg total) by mouth 2 (two) times daily as needed for anxiety. 20 tablet 0   aspirin EC 81 MG tablet Take 1 tablet (81 mg total) by mouth daily. 30 tablet 11   busPIRone (BUSPAR) 7.5 MG tablet Take 1 tablet (7.5 mg total) by mouth daily as needed (for anxiety). 30 tablet 0   clopidogrel (PLAVIX) 75 MG tablet Take 1 tablet (75 mg total) by mouth daily. 30 tablet 0   divalproex (DEPAKOTE) 250 MG DR tablet Take 1 tablet (250 mg total) by mouth 2 (two) times daily. 30 tablet 0   donepezil (ARICEPT) 5 MG tablet Take 5 mg by mouth daily.     DULoxetine (CYMBALTA) 60 MG capsule Take 60 mg by mouth at bedtime.     ezetimibe (ZETIA) 10 MG tablet Take 10 mg by mouth daily.     famotidine (PEPCID) 20 MG tablet Take 20 mg by mouth 2 (two) times daily.     ferrous sulfate 325 (65 FE) MG tablet Take 325 mg by mouth daily with breakfast. (Patient not taking: Reported on 07/30/2023)     hydrALAZINE (APRESOLINE) 50 MG tablet Take 50 mg by mouth in the morning and at bedtime.     ipratropium-albuterol (DUONEB) 0.5-2.5 (3) MG/3ML SOLN Take 3 mLs by nebulization every 6 (six) hours as needed (for shortness of breath or wheezing).     JARDIANCE 25 MG TABS  tablet Take 1 tablet (25 mg total) by mouth daily. 30 tablet 2   metoprolol tartrate (LOPRESSOR) 25 MG tablet Take 25 mg by mouth 2 (two) times daily.     nitroGLYCERIN (NITROSTAT) 0.4 MG SL tablet Place 0.4 mg under the tongue every 5 (five) minutes as needed for  chest pain.     Potassium Chloride ER 20 MEQ TBCR Take 2 tablets by mouth daily.     PROLIA 60 MG/ML SOSY injection Inject 60 mg into the skin every 6 (six) months.     XIFAXAN 550 MG TABS tablet Take 550 mg by mouth 3 (three) times daily.     zonisamide (ZONEGRAN) 100 MG capsule Take 200 mg by mouth at bedtime.     No current facility-administered medications for this visit.    I,Jasmine M Lassiter,acting as a scribe for Dellia Beckwith, MD.,have documented all relevant documentation on the behalf of Dellia Beckwith, MD,as directed by  Dellia Beckwith, MD while in the presence of Dellia Beckwith, MD.

## 2023-09-30 ENCOUNTER — Inpatient Hospital Stay: Payer: Medicare Other | Admitting: Oncology

## 2023-09-30 ENCOUNTER — Inpatient Hospital Stay: Payer: Medicare Other

## 2023-09-30 DIAGNOSIS — C3411 Malignant neoplasm of upper lobe, right bronchus or lung: Secondary | ICD-10-CM

## 2023-09-30 DIAGNOSIS — C50412 Malignant neoplasm of upper-outer quadrant of left female breast: Secondary | ICD-10-CM

## 2023-10-09 DIAGNOSIS — E1169 Type 2 diabetes mellitus with other specified complication: Secondary | ICD-10-CM | POA: Diagnosis not present

## 2023-10-09 DIAGNOSIS — E785 Hyperlipidemia, unspecified: Secondary | ICD-10-CM | POA: Diagnosis not present

## 2023-10-14 ENCOUNTER — Inpatient Hospital Stay (HOSPITAL_BASED_OUTPATIENT_CLINIC_OR_DEPARTMENT_OTHER): Payer: Medicare Other | Admitting: Oncology

## 2023-10-14 ENCOUNTER — Inpatient Hospital Stay: Payer: Medicare Other | Attending: Hematology and Oncology

## 2023-10-14 ENCOUNTER — Other Ambulatory Visit: Payer: Self-pay | Admitting: Oncology

## 2023-10-14 VITALS — BP 158/88 | HR 97 | Temp 97.7°F | Resp 20 | Ht 61.0 in | Wt 111.6 lb

## 2023-10-14 DIAGNOSIS — Z9221 Personal history of antineoplastic chemotherapy: Secondary | ICD-10-CM | POA: Diagnosis not present

## 2023-10-14 DIAGNOSIS — Z923 Personal history of irradiation: Secondary | ICD-10-CM | POA: Insufficient documentation

## 2023-10-14 DIAGNOSIS — F17210 Nicotine dependence, cigarettes, uncomplicated: Secondary | ICD-10-CM | POA: Diagnosis not present

## 2023-10-14 DIAGNOSIS — C3411 Malignant neoplasm of upper lobe, right bronchus or lung: Secondary | ICD-10-CM | POA: Insufficient documentation

## 2023-10-14 DIAGNOSIS — Z171 Estrogen receptor negative status [ER-]: Secondary | ICD-10-CM

## 2023-10-14 DIAGNOSIS — C50412 Malignant neoplasm of upper-outer quadrant of left female breast: Secondary | ICD-10-CM | POA: Diagnosis not present

## 2023-10-14 DIAGNOSIS — D500 Iron deficiency anemia secondary to blood loss (chronic): Secondary | ICD-10-CM

## 2023-10-14 DIAGNOSIS — Z853 Personal history of malignant neoplasm of breast: Secondary | ICD-10-CM | POA: Insufficient documentation

## 2023-10-14 DIAGNOSIS — D509 Iron deficiency anemia, unspecified: Secondary | ICD-10-CM | POA: Diagnosis not present

## 2023-10-14 LAB — CMP (CANCER CENTER ONLY)
ALT: 6 U/L (ref 0–44)
AST: 18 U/L (ref 15–41)
Albumin: 4.1 g/dL (ref 3.5–5.0)
Alkaline Phosphatase: 98 U/L (ref 38–126)
Anion gap: 13 (ref 5–15)
BUN: 15 mg/dL (ref 8–23)
CO2: 25 mmol/L (ref 22–32)
Calcium: 9.7 mg/dL (ref 8.9–10.3)
Chloride: 103 mmol/L (ref 98–111)
Creatinine: 0.85 mg/dL (ref 0.44–1.00)
GFR, Estimated: >60 mL/min (ref >60)
Glucose, Bld: 105 mg/dL — ABNORMAL HIGH (ref 70–99)
Potassium: 3.5 mmol/L (ref 3.5–5.1)
Sodium: 142 mmol/L (ref 135–145)
Total Bilirubin: 0.2 mg/dL (ref 0.0–1.2)
Total Protein: 7.3 g/dL (ref 6.5–8.1)

## 2023-10-14 LAB — CBC WITH DIFFERENTIAL (CANCER CENTER ONLY)
Abs Immature Granulocytes: 0.02 10*3/uL (ref 0.00–0.07)
Basophils Absolute: 0 10*3/uL (ref 0.0–0.1)
Basophils Relative: 1 %
Eosinophils Absolute: 0.2 10*3/uL (ref 0.0–0.5)
Eosinophils Relative: 3 %
HCT: 44.4 % (ref 36.0–46.0)
Hemoglobin: 14.5 g/dL (ref 12.0–15.0)
Immature Granulocytes: 0 %
Lymphocytes Relative: 23 %
Lymphs Abs: 1.3 10*3/uL (ref 0.7–4.0)
MCH: 30.9 pg (ref 26.0–34.0)
MCHC: 32.7 g/dL (ref 30.0–36.0)
MCV: 94.5 fL (ref 80.0–100.0)
Monocytes Absolute: 0.3 10*3/uL (ref 0.1–1.0)
Monocytes Relative: 5 %
Neutro Abs: 3.9 10*3/uL (ref 1.7–7.7)
Neutrophils Relative %: 68 %
Platelet Count: 214 10*3/uL (ref 150–400)
RBC: 4.7 MIL/uL (ref 3.87–5.11)
RDW: 15.4 % (ref 11.5–15.5)
WBC Count: 5.7 10*3/uL (ref 4.0–10.5)
nRBC: 0 % (ref 0.0–0.2)
nRBC: 0 /100{WBCs}

## 2023-10-14 LAB — IRON AND TIBC
Iron: 66 ug/dL (ref 28–170)
Saturation Ratios: 18 % (ref 10.4–31.8)
TIBC: 368 ug/dL (ref 250–450)
UIBC: 302 ug/dL

## 2023-10-14 LAB — FERRITIN: Ferritin: 31 ng/mL (ref 11–307)
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
## 2023-10-15 ENCOUNTER — Telehealth: Payer: Self-pay | Admitting: Oncology

## 2023-10-15 NOTE — Telephone Encounter (Signed)
 10/15/23 Left msg upcoming appts.

## 2023-10-19 DIAGNOSIS — Z6821 Body mass index (BMI) 21.0-21.9, adult: Secondary | ICD-10-CM | POA: Diagnosis not present

## 2023-10-19 DIAGNOSIS — Z139 Encounter for screening, unspecified: Secondary | ICD-10-CM | POA: Diagnosis not present

## 2023-10-19 DIAGNOSIS — Z0000 Encounter for general adult medical examination without abnormal findings: Secondary | ICD-10-CM | POA: Diagnosis not present

## 2023-10-19 DIAGNOSIS — Z136 Encounter for screening for cardiovascular disorders: Secondary | ICD-10-CM | POA: Diagnosis not present

## 2023-10-20 DIAGNOSIS — R0603 Acute respiratory distress: Secondary | ICD-10-CM | POA: Diagnosis not present

## 2023-10-20 DIAGNOSIS — U071 COVID-19: Secondary | ICD-10-CM | POA: Diagnosis not present

## 2023-10-24 ENCOUNTER — Encounter: Payer: Self-pay | Admitting: Oncology

## 2023-10-24 ENCOUNTER — Encounter: Payer: Self-pay | Admitting: Hematology and Oncology

## 2023-10-25 ENCOUNTER — Other Ambulatory Visit (HOSPITAL_BASED_OUTPATIENT_CLINIC_OR_DEPARTMENT_OTHER): Payer: Self-pay | Admitting: Family Medicine

## 2023-10-25 ENCOUNTER — Telehealth: Payer: Self-pay

## 2023-10-25 ENCOUNTER — Ambulatory Visit (INDEPENDENT_AMBULATORY_CARE_PROVIDER_SITE_OTHER)
Admission: RE | Admit: 2023-10-25 | Discharge: 2023-10-25 | Disposition: A | Discharge status: Home / Self Care | Source: Ambulatory Visit | Attending: Family Medicine | Admitting: Family Medicine

## 2023-10-25 DIAGNOSIS — R051 Acute cough: Secondary | ICD-10-CM

## 2023-10-25 DIAGNOSIS — R059 Cough, unspecified: Secondary | ICD-10-CM | POA: Diagnosis not present

## 2023-10-25 DIAGNOSIS — Z6821 Body mass index (BMI) 21.0-21.9, adult: Secondary | ICD-10-CM | POA: Diagnosis not present

## 2023-10-25 DIAGNOSIS — Z20822 Contact with and (suspected) exposure to covid-19: Secondary | ICD-10-CM | POA: Diagnosis not present

## 2023-10-25 DIAGNOSIS — J029 Acute pharyngitis, unspecified: Secondary | ICD-10-CM | POA: Diagnosis not present

## 2023-10-25 DIAGNOSIS — J449 Chronic obstructive pulmonary disease, unspecified: Secondary | ICD-10-CM | POA: Diagnosis not present

## 2023-10-25 NOTE — Telephone Encounter (Signed)
 Patient's daughter Casimer Bilis made aware of message.

## 2023-10-25 NOTE — Telephone Encounter (Signed)
-----   Message from Dellia Beckwith sent at 10/24/2023 10:41 AM EDT ----- Regarding: call Tell daughter her iron levels look good

## 2023-11-02 DIAGNOSIS — Z6821 Body mass index (BMI) 21.0-21.9, adult: Secondary | ICD-10-CM | POA: Diagnosis not present

## 2023-11-02 DIAGNOSIS — C3490 Malignant neoplasm of unspecified part of unspecified bronchus or lung: Secondary | ICD-10-CM | POA: Diagnosis not present

## 2023-11-06 DIAGNOSIS — M533 Sacrococcygeal disorders, not elsewhere classified: Secondary | ICD-10-CM | POA: Diagnosis not present

## 2023-11-06 DIAGNOSIS — S300XXA Contusion of lower back and pelvis, initial encounter: Secondary | ICD-10-CM | POA: Diagnosis not present

## 2023-11-07 DIAGNOSIS — M533 Sacrococcygeal disorders, not elsewhere classified: Secondary | ICD-10-CM | POA: Diagnosis not present

## 2023-11-09 DIAGNOSIS — I10 Essential (primary) hypertension: Secondary | ICD-10-CM | POA: Diagnosis not present

## 2023-11-09 DIAGNOSIS — E1169 Type 2 diabetes mellitus with other specified complication: Secondary | ICD-10-CM | POA: Diagnosis not present

## 2023-11-10 DIAGNOSIS — Z5329 Procedure and treatment not carried out because of patient's decision for other reasons: Secondary | ICD-10-CM | POA: Diagnosis not present

## 2023-11-10 DIAGNOSIS — F17210 Nicotine dependence, cigarettes, uncomplicated: Secondary | ICD-10-CM | POA: Diagnosis not present

## 2023-11-10 DIAGNOSIS — I4510 Unspecified right bundle-branch block: Secondary | ICD-10-CM | POA: Diagnosis not present

## 2023-11-10 DIAGNOSIS — I700 Atherosclerosis of aorta: Secondary | ICD-10-CM | POA: Diagnosis not present

## 2023-11-10 DIAGNOSIS — R0789 Other chest pain: Secondary | ICD-10-CM | POA: Diagnosis not present

## 2023-11-10 DIAGNOSIS — R079 Chest pain, unspecified: Secondary | ICD-10-CM | POA: Diagnosis not present

## 2023-11-18 DIAGNOSIS — Z6821 Body mass index (BMI) 21.0-21.9, adult: Secondary | ICD-10-CM | POA: Diagnosis not present

## 2023-11-20 DIAGNOSIS — R0603 Acute respiratory distress: Secondary | ICD-10-CM | POA: Diagnosis not present

## 2023-11-20 DIAGNOSIS — U071 COVID-19: Secondary | ICD-10-CM | POA: Diagnosis not present

## 2023-12-09 DIAGNOSIS — I10 Essential (primary) hypertension: Secondary | ICD-10-CM | POA: Diagnosis not present

## 2023-12-09 DIAGNOSIS — E1169 Type 2 diabetes mellitus with other specified complication: Secondary | ICD-10-CM | POA: Diagnosis not present

## 2023-12-15 ENCOUNTER — Telehealth: Payer: Self-pay

## 2023-12-15 ENCOUNTER — Other Ambulatory Visit: Payer: Self-pay | Admitting: Hematology and Oncology

## 2023-12-15 MED ORDER — ALPRAZOLAM 0.25 MG PO TABS: 0.2500 mg | ORAL_TABLET | Freq: Two times a day (BID) | ORAL | 0 refills | Status: DC | PRN

## 2023-12-15 NOTE — Telephone Encounter (Signed)
-----   Message from Adelaide Adjutant sent at 12/15/2023  8:37 AM EDT ----- Sent xanax  ----- Message ----- From: Shelbie Dess, RN Sent: 12/15/2023   8:18 AM EDT To: Adelaide Adjutant, NP  Patient is scheduled for CT scan on 12/23/2023, daughter called asking for a prescription for anxiety to get her through the scan.  They need it sent to [facility] on [address].

## 2023-12-20 DIAGNOSIS — U071 COVID-19: Secondary | ICD-10-CM | POA: Diagnosis not present

## 2023-12-20 DIAGNOSIS — R0603 Acute respiratory distress: Secondary | ICD-10-CM | POA: Diagnosis not present

## 2023-12-23 ENCOUNTER — Ambulatory Visit (INDEPENDENT_AMBULATORY_CARE_PROVIDER_SITE_OTHER)
Admission: RE | Admit: 2023-12-23 | Discharge: 2023-12-23 | Disposition: A | Discharge status: Home / Self Care | Source: Ambulatory Visit | Attending: Oncology | Admitting: Oncology

## 2023-12-23 DIAGNOSIS — C3411 Malignant neoplasm of upper lobe, right bronchus or lung: Secondary | ICD-10-CM | POA: Diagnosis not present

## 2023-12-23 MED ORDER — IOHEXOL 300 MG/ML  SOLN
100.0000 mL | Freq: Once | INTRAMUSCULAR | Status: AC | PRN
  Administered 2023-12-23: 75 mL via INTRAVENOUS

## 2024-01-11 DIAGNOSIS — E1169 Type 2 diabetes mellitus with other specified complication: Secondary | ICD-10-CM | POA: Diagnosis not present

## 2024-01-11 DIAGNOSIS — I10 Essential (primary) hypertension: Secondary | ICD-10-CM | POA: Diagnosis not present
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
## 2024-01-13 ENCOUNTER — Encounter: Payer: Self-pay | Admitting: Oncology

## 2024-01-13 ENCOUNTER — Telehealth: Payer: Self-pay

## 2024-01-13 ENCOUNTER — Telehealth: Payer: Self-pay | Admitting: Oncology

## 2024-01-13 ENCOUNTER — Other Ambulatory Visit: Payer: Self-pay | Admitting: Oncology

## 2024-01-13 ENCOUNTER — Inpatient Hospital Stay

## 2024-01-13 ENCOUNTER — Inpatient Hospital Stay: Attending: Hematology and Oncology | Admitting: Oncology

## 2024-01-13 VITALS — BP 160/78 | HR 73 | Temp 97.7°F | Resp 18 | Ht 61.0 in | Wt 124.6 lb

## 2024-01-13 DIAGNOSIS — Z171 Estrogen receptor negative status [ER-]: Secondary | ICD-10-CM

## 2024-01-13 DIAGNOSIS — D509 Iron deficiency anemia, unspecified: Secondary | ICD-10-CM | POA: Diagnosis not present

## 2024-01-13 DIAGNOSIS — Z923 Personal history of irradiation: Secondary | ICD-10-CM | POA: Diagnosis not present

## 2024-01-13 DIAGNOSIS — C50412 Malignant neoplasm of upper-outer quadrant of left female breast: Secondary | ICD-10-CM

## 2024-01-13 DIAGNOSIS — C3411 Malignant neoplasm of upper lobe, right bronchus or lung: Secondary | ICD-10-CM

## 2024-01-13 DIAGNOSIS — Z853 Personal history of malignant neoplasm of breast: Secondary | ICD-10-CM | POA: Insufficient documentation

## 2024-01-13 DIAGNOSIS — Z9221 Personal history of antineoplastic chemotherapy: Secondary | ICD-10-CM | POA: Insufficient documentation

## 2024-01-13 LAB — CBC WITH DIFFERENTIAL (CANCER CENTER ONLY)
Abs Immature Granulocytes: 0.03 10*3/uL (ref 0.00–0.07)
Basophils Absolute: 0.1 10*3/uL (ref 0.0–0.1)
Basophils Relative: 1 %
Eosinophils Absolute: 0.2 10*3/uL (ref 0.0–0.5)
Eosinophils Relative: 2 %
HCT: 43.6 % (ref 36.0–46.0)
Hemoglobin: 14.2 g/dL (ref 12.0–15.0)
Immature Granulocytes: 0 %
Lymphocytes Relative: 17 %
Lymphs Abs: 1.5 10*3/uL (ref 0.7–4.0)
MCH: 31.8 pg (ref 26.0–34.0)
MCHC: 32.6 g/dL (ref 30.0–36.0)
MCV: 97.8 fL (ref 80.0–100.0)
Monocytes Absolute: 0.7 10*3/uL (ref 0.1–1.0)
Monocytes Relative: 8 %
Neutro Abs: 6 10*3/uL (ref 1.7–7.7)
Neutrophils Relative %: 72 %
Platelet Count: 216 10*3/uL (ref 150–400)
RBC: 4.46 MIL/uL (ref 3.87–5.11)
RDW: 13.8 % (ref 11.5–15.5)
WBC Count: 8.4 10*3/uL (ref 4.0–10.5)
nRBC: 0 % (ref 0.0–0.2)

## 2024-01-13 LAB — CMP (CANCER CENTER ONLY)
ALT: 7 U/L (ref 0–44)
AST: 23 U/L (ref 15–41)
Albumin: 4 g/dL (ref 3.5–5.0)
Alkaline Phosphatase: 94 U/L (ref 38–126)
Anion gap: 15 (ref 5–15)
BUN: 22 mg/dL (ref 8–23)
CO2: 24 mmol/L (ref 22–32)
Calcium: 9.8 mg/dL (ref 8.9–10.3)
Chloride: 103 mmol/L (ref 98–111)
Creatinine: 0.95 mg/dL (ref 0.44–1.00)
GFR, Estimated: >60 mL/min (ref >60)
Glucose, Bld: 101 mg/dL — ABNORMAL HIGH (ref 70–99)
Potassium: 3.7 mmol/L (ref 3.5–5.1)
Sodium: 142 mmol/L (ref 135–145)
Total Bilirubin: 0.3 mg/dL (ref 0.0–1.2)
Total Protein: 7 g/dL (ref 6.5–8.1)

## 2024-01-13 LAB — CEA (ACCESS): CEA (CHCC): 2.16 ng/mL (ref 0.00–5.00)

## 2024-01-13 NOTE — Telephone Encounter (Signed)
 Patient has been scheduled for follow-up visit per 01/13/24 LOS.  Pt given an appt calendar with date and time.

## 2024-01-13 NOTE — Telephone Encounter (Signed)
 Call daughter Spence Dux and she states that she would like her mothers diagnostic mammogram to be done at [facility].

## 2024-01-18 DIAGNOSIS — Z6822 Body mass index (BMI) 22.0-22.9, adult: Secondary | ICD-10-CM | POA: Diagnosis not present

## 2024-01-18 DIAGNOSIS — E785 Hyperlipidemia, unspecified: Secondary | ICD-10-CM | POA: Diagnosis not present

## 2024-01-18 DIAGNOSIS — C3490 Malignant neoplasm of unspecified part of unspecified bronchus or lung: Secondary | ICD-10-CM | POA: Diagnosis not present

## 2024-01-18 DIAGNOSIS — C50912 Malignant neoplasm of unspecified site of left female breast: Secondary | ICD-10-CM | POA: Diagnosis not present

## 2024-01-18 DIAGNOSIS — E1169 Type 2 diabetes mellitus with other specified complication: Secondary | ICD-10-CM | POA: Diagnosis not present

## 2024-01-19 ENCOUNTER — Telehealth: Payer: Self-pay

## 2024-01-19 NOTE — Telephone Encounter (Signed)
-----   Message from Nolia Baumgartner sent at 01/13/2024  5:43 PM EDT ----- Regarding: call Can tell them labs look great

## 2024-01-19 NOTE — Telephone Encounter (Signed)
 Called daughter Spence Dux and notified her of patients lab results.

## 2024-01-20 ENCOUNTER — Encounter: Payer: Self-pay | Admitting: Hematology and Oncology

## 2024-01-20 DIAGNOSIS — U071 COVID-19: Secondary | ICD-10-CM | POA: Diagnosis not present

## 2024-01-20 DIAGNOSIS — R0603 Acute respiratory distress: Secondary | ICD-10-CM | POA: Diagnosis not present

## 2024-01-27 ENCOUNTER — Encounter: Payer: Self-pay | Admitting: Hematology and Oncology

## 2024-02-02 DIAGNOSIS — C50212 Malignant neoplasm of upper-inner quadrant of left female breast: Secondary | ICD-10-CM | POA: Diagnosis not present

## 2024-02-02 LAB — HM MAMMOGRAPHY

## 2024-02-18 DIAGNOSIS — I10 Essential (primary) hypertension: Secondary | ICD-10-CM | POA: Diagnosis not present

## 2024-02-18 DIAGNOSIS — W16022A Fall into swimming pool striking bottom causing other injury, initial encounter: Secondary | ICD-10-CM | POA: Diagnosis not present

## 2024-02-18 DIAGNOSIS — Z7902 Long term (current) use of antithrombotics/antiplatelets: Secondary | ICD-10-CM | POA: Diagnosis not present

## 2024-02-18 DIAGNOSIS — E119 Type 2 diabetes mellitus without complications: Secondary | ICD-10-CM | POA: Diagnosis not present

## 2024-02-18 DIAGNOSIS — S79911A Unspecified injury of right hip, initial encounter: Secondary | ICD-10-CM | POA: Diagnosis not present

## 2024-02-18 DIAGNOSIS — S7001XA Contusion of right hip, initial encounter: Secondary | ICD-10-CM | POA: Diagnosis not present

## 2024-02-18 DIAGNOSIS — Z7982 Long term (current) use of aspirin: Secondary | ICD-10-CM | POA: Diagnosis not present

## 2024-02-18 DIAGNOSIS — F17210 Nicotine dependence, cigarettes, uncomplicated: Secondary | ICD-10-CM | POA: Diagnosis not present

## 2024-02-18 DIAGNOSIS — Z8673 Personal history of transient ischemic attack (TIA), and cerebral infarction without residual deficits: Secondary | ICD-10-CM | POA: Diagnosis not present

## 2024-02-18 DIAGNOSIS — Z794 Long term (current) use of insulin: Secondary | ICD-10-CM | POA: Diagnosis not present

## 2024-02-19 DIAGNOSIS — U071 COVID-19: Secondary | ICD-10-CM | POA: Diagnosis not present

## 2024-02-19 DIAGNOSIS — R0603 Acute respiratory distress: Secondary | ICD-10-CM | POA: Diagnosis not present

## 2024-02-23 DIAGNOSIS — Z961 Presence of intraocular lens: Secondary | ICD-10-CM | POA: Diagnosis not present

## 2024-02-23 DIAGNOSIS — H52223 Regular astigmatism, bilateral: Secondary | ICD-10-CM | POA: Diagnosis not present

## 2024-02-23 DIAGNOSIS — H524 Presbyopia: Secondary | ICD-10-CM | POA: Diagnosis not present

## 2024-02-23 DIAGNOSIS — H5231 Anisometropia: Secondary | ICD-10-CM | POA: Diagnosis not present

## 2024-03-03 ENCOUNTER — Other Ambulatory Visit: Payer: Self-pay | Admitting: Hematology and Oncology

## 2024-03-03 ENCOUNTER — Telehealth: Payer: Self-pay

## 2024-03-03 MED ORDER — ALPRAZOLAM 0.25 MG PO TABS: 0.2500 mg | ORAL_TABLET | Freq: Two times a day (BID) | ORAL | 0 refills | Status: DC | PRN

## 2024-03-03 NOTE — Telephone Encounter (Signed)
 Patient needs xanax  refill ASAP, she found her daughter dead recently and is out of meds and service is this evening per daughter. [facility] [address]

## 2024-03-06 ENCOUNTER — Encounter: Payer: Self-pay | Admitting: Hematology and Oncology

## 2024-03-10 DIAGNOSIS — E1169 Type 2 diabetes mellitus with other specified complication: Secondary | ICD-10-CM | POA: Diagnosis not present

## 2024-03-10 DIAGNOSIS — I10 Essential (primary) hypertension: Secondary | ICD-10-CM | POA: Diagnosis not present

## 2024-03-21 DIAGNOSIS — R0603 Acute respiratory distress: Secondary | ICD-10-CM | POA: Diagnosis not present

## 2024-04-11 DIAGNOSIS — K219 Gastro-esophageal reflux disease without esophagitis: Secondary | ICD-10-CM | POA: Diagnosis not present

## 2024-04-12 DIAGNOSIS — K921 Melena: Secondary | ICD-10-CM | POA: Diagnosis not present

## 2024-04-12 DIAGNOSIS — R109 Unspecified abdominal pain: Secondary | ICD-10-CM | POA: Diagnosis not present

## 2024-04-14 DIAGNOSIS — R109 Unspecified abdominal pain: Secondary | ICD-10-CM | POA: Diagnosis not present

## 2024-04-15 DIAGNOSIS — R109 Unspecified abdominal pain: Secondary | ICD-10-CM | POA: Diagnosis not present

## 2024-04-21 ENCOUNTER — Telehealth: Payer: Self-pay | Admitting: Oncology

## 2024-04-21 ENCOUNTER — Other Ambulatory Visit: Payer: Self-pay | Admitting: Oncology

## 2024-04-21 ENCOUNTER — Encounter: Payer: Self-pay | Admitting: Oncology

## 2024-04-21 ENCOUNTER — Inpatient Hospital Stay: Attending: Hematology and Oncology | Admitting: Oncology

## 2024-04-21 ENCOUNTER — Inpatient Hospital Stay

## 2024-04-21 VITALS — BP 159/59 | HR 60 | Temp 97.8°F | Resp 18 | Ht 61.0 in | Wt 126.7 lb

## 2024-04-21 DIAGNOSIS — M810 Age-related osteoporosis without current pathological fracture: Secondary | ICD-10-CM | POA: Insufficient documentation

## 2024-04-21 DIAGNOSIS — C3411 Malignant neoplasm of upper lobe, right bronchus or lung: Secondary | ICD-10-CM | POA: Diagnosis not present

## 2024-04-21 DIAGNOSIS — D509 Iron deficiency anemia, unspecified: Secondary | ICD-10-CM | POA: Diagnosis not present

## 2024-04-21 DIAGNOSIS — Z171 Estrogen receptor negative status [ER-]: Secondary | ICD-10-CM

## 2024-04-21 DIAGNOSIS — Z853 Personal history of malignant neoplasm of breast: Secondary | ICD-10-CM | POA: Insufficient documentation

## 2024-04-21 DIAGNOSIS — Z79899 Other long term (current) drug therapy: Secondary | ICD-10-CM | POA: Insufficient documentation

## 2024-04-21 DIAGNOSIS — R569 Unspecified convulsions: Secondary | ICD-10-CM | POA: Insufficient documentation

## 2024-04-21 DIAGNOSIS — C50412 Malignant neoplasm of upper-outer quadrant of left female breast: Secondary | ICD-10-CM

## 2024-04-21 LAB — CMP (CANCER CENTER ONLY)
ALT: <5 U/L (ref 0–44)
AST: 18 U/L (ref 15–41)
Albumin: 3.7 g/dL (ref 3.5–5.0)
Alkaline Phosphatase: 92 U/L (ref 38–126)
Anion gap: 16 — ABNORMAL HIGH (ref 5–15)
BUN: 18 mg/dL (ref 8–23)
CO2: 24 mmol/L (ref 22–32)
Calcium: 9.2 mg/dL (ref 8.9–10.3)
Chloride: 103 mmol/L (ref 98–111)
Creatinine: 0.93 mg/dL (ref 0.44–1.00)
GFR, Estimated: >60 mL/min (ref >60)
Glucose, Bld: 94 mg/dL (ref 70–99)
Potassium: 3.5 mmol/L (ref 3.5–5.1)
Sodium: 143 mmol/L (ref 135–145)
Total Bilirubin: 0.3 mg/dL (ref 0.0–1.2)
Total Protein: 7 g/dL (ref 6.5–8.1)

## 2024-04-21 LAB — CBC WITH DIFFERENTIAL (CANCER CENTER ONLY)
Abs Immature Granulocytes: 0.02 K/uL (ref 0.00–0.07)
Basophils Absolute: 0 K/uL (ref 0.0–0.1)
Basophils Relative: 1 %
Eosinophils Absolute: 0.1 K/uL (ref 0.0–0.5)
Eosinophils Relative: 2 %
HCT: 44.4 % (ref 36.0–46.0)
Hemoglobin: 14.6 g/dL (ref 12.0–15.0)
Immature Granulocytes: 0 %
Lymphocytes Relative: 19 %
Lymphs Abs: 1.5 K/uL (ref 0.7–4.0)
MCH: 31.8 pg (ref 26.0–34.0)
MCHC: 32.9 g/dL (ref 30.0–36.0)
MCV: 96.7 fL (ref 80.0–100.0)
Monocytes Absolute: 0.6 K/uL (ref 0.1–1.0)
Monocytes Relative: 7 %
Neutro Abs: 5.8 K/uL (ref 1.7–7.7)
Neutrophils Relative %: 71 %
Platelet Count: 180 K/uL (ref 150–400)
RBC: 4.59 MIL/uL (ref 3.87–5.11)
RDW: 12.9 % (ref 11.5–15.5)
WBC Count: 8.1 K/uL (ref 4.0–10.5)
nRBC: 0 % (ref 0.0–0.2)

## 2024-04-21 LAB — CEA (ACCESS): CEA (CHCC): 1.61 ng/mL (ref 0.00–5.00)

## 2024-04-21 NOTE — Telephone Encounter (Signed)
 Patient has been scheduled for follow-up visit per 04/21/24 LOS.  Pt given an appt calendar with date and time.

## 2024-04-21 NOTE — Progress Notes (Signed)
 [name]  [address] [place],  [place]  [address] [phone]  Clinic Day: 04/21/2024  Referring physician: [name], MD  ASSESSMENT & PLAN:  Assessment: Lung cancer, upper lobe (HCC) Non-small cell lung cancer of the right upper lobe, clinical stage IA3 (T1c N0 M0).  CT chest, abdomen and pelvis done in the ER July 22 after a fall revealed a 25 x 22 mm right upper lobe groundglass nodule increased in size from September 2023.  There were central lobar micronodules in the right upper lobe, lingula and right middle lobe.  PET on August 16 revealed 2.5 cm subsolid nodule in the central right upper lobe, suspicious for primary bronchogenic carcinoma.  No findings suspicious for metastatic disease. There are radiation changes involving the left breast and anterior left upper lobe. Multiple left anterolateral rib fracture deformities, including a mildly displaced left lateral 9th rib. There was also a hypermetabolic 12 mm right isthmic thyroid  nodule.She was referred to Dr. [name] and underwent bronchoscopy on September 9.  Brushings revealed malignant cells consistent with non-small cell lung cancer. MRI brain did not reveal any evidence of intracranial metastasis. She was referred to [name] fwho administered SBRT for her lung cancer. CTA chest on 07/13/2023 did not reveal any evidence of pulmonary embolism.  There was continued subpleural nodularity and thickening anteriorly in the lingular segment of left upper lobe which may be increased compared to prior exam, suggesting worsening inflammation. Grossly stable semi solid density seen centrally in right upper lobe. She has been back to [name] and finally completed SBRT to the right upper lobe mass. We took over her follow-up from here and repeat CT chest on 12/23/23 reveals the known lung lesion to be 2.3 cm, down from 2.6 cm, with surrounding radiation changes.  Pseudoseizures She was admitted December 2 with altered mental  status. This was felt to be due to pseudoseizure, which she had evaluated in 2022.   Neurology was consulted.  EEG was normal.  MRI brain during her hospitalization did not reveal any evidence of intracranial abnormality. Psychiatry referral as an outpatient was recommended to standardize medications, she has polypharmacy and was not probably taking all the meds she needs.  She was given a very low dose of Xanax  twice daily given the functional nature of her disorder.   Breast cancer of upper-outer quadrant of left female breast (HCC) Stage IB triple negative breast cancer. She had difficulty tolerating chemotherapy with docetaxel /cyclophosphamide  despite dose reduction.  She completed chemotherapy in December, 2023.  She received adjuvant radiation to the left breast completed in early 2024.  Bilateral diagnostic mammogram in June of 2025 did not reveal any evidence of malignancy.  She remains without evidence of recurrence.   Iron  deficiency anemia Iron  deficiency anemia. She did not tolerate oral iron . She received IV iron  when hospitalized December 2024. We continued this as an outpatient and she completed 5 doses of iron  sucrose on 07/29/2023. This has now resolved with a hemoglobin of 14.6.   Dementia She is on Aricept and low dose Xanax .   Plan: She is experiencing bloody diarrhea which is slowly improving and she was positive for C dif. She still experiences shortness of breath and dizziness when sitting up. She is understandably upset and grieving following the recent death of her youngest daughter. Her diagnostic mammogram on 02/02/2024 was negative. She continues to smoke, but has decreased to 1.5 packs per day. She has a WBC of 8.1, hemoglobin of 14.6, and  platelet count of 180,000. Her CMP is normal. Her CEA is pending today. She has a cardiology appointment with Dr. Raylene on 04/28/2024. I will see her back in 3 months with CBC, CMP, CEA, and CT chest. The patient and her daughter understand  the plans discussed today and are in agreement with them.  They know  to contact our office if she develops concerns prior to her next appointment.  I provided 16 minutes of face-to-face time during this encounter and > 50% was spent counseling as documented under my assessment and plan.   Lindsay VEAR Cornish, MD [facility] - A DEPT OF [facility] [address] [place] [address] Dept: [phone] Dept Fax: [phone]   No orders of the defined types were placed in this encounter.  CHIEF COMPLAINT:  CC: Stage IA3 non-small cell lung cancer  Current Treatment: Completed SBRT  HISTORY OF PRESENT ILLNESS:   Oncology History  Breast cancer of upper-outer quadrant of left female breast (HCC)  03/18/2022 Initial Diagnosis   Breast cancer of upper-outer quadrant of left female breast (HCC)   04/28/2022 Cancer Staging   Staging form: Breast, AJCC 8th Edition - Clinical stage from 04/28/2022: Stage IB (cT1c, cN0(sn), cM0, G3, ER-, PR-, HER2-) - Signed by [name], MD on 04/28/2022 Histopathologic type: Infiltrating duct carcinoma, NOS Stage prefix: Initial diagnosis Method of lymph node assessment: Sentinel lymph node biopsy Nuclear grade: G3 Multigene prognostic tests performed: None Histologic grading system: 3 grade system Laterality: Left Tumor size (mm): 20 Lymph-vascular invasion (LVI): LVI not present (absent)/not identified Diagnostic confirmation: Positive histology Specimen type: Excision Staged by: Managing physician Menopausal status: Postmenopausal Ki-67 (%): 40 Stage used in treatment planning: Yes National guidelines used in treatment planning: Yes Type of national guideline used in treatment planning: NCCN   05/07/2022 - 07/14/2022 Chemotherapy   Patient is on Treatment Plan : BREAST TC q21d     05/19/2022 Genetic Testing   Negative hereditary cancer genetic testing: no pathogenic variant  detected in [facility] Common Hereditary Cancers +RNA Panel.  Report date is May 19, 2022.    The Common Hereditary Cancers + RNA Panel offered by [facility] includes sequencing, deletion/duplication, and RNA testing of the following 47 genes: APC, ATM, AXIN2, BARD1, BMPR1A, BRCA1, BRCA2, BRIP1, CDH1, CDK4*, CDKN2A (p14ARF)*, CDKN2A (p16INK4a)*, CHEK2, CTNNA1, DICER1, EPCAM (Deletion/duplication testing only), GREM1 (promoter region deletion/duplication testing only), KIT, MEN1, MLH1, MSH2, MSH3, MSH6, MUTYH, NBN, NF1, NHTL1, PALB2, PDGFRA*, PMS2, POLD1, POLE, PTEN, RAD50, RAD51C, RAD51D, SDHB, SDHC, SDHD, SMAD4, SMARCA4. STK11, TP53, TSC1, TSC2, and VHL.  The following genes were evaluated for sequence changes only: SDHA and HOXB13 c.251G>A variant only.  RNA analysis is not performed for the * genes.     Primary cancer of right upper lobe of lung (HCC)  04/19/2023 Cancer Staging   Staging form: Lung, AJCC 8th Edition - Clinical stage from 04/19/2023: Stage IA3 (cT1c, cN0, cM0) - Signed by [name], MD on 05/23/2023 Histopathologic type: Carcinoma, NOS Stage prefix: Initial diagnosis Laterality: Right Tumor size (mm): 25 Lymph-vascular invasion (LVI): Presence of LVI unknown/indeterminate Diagnostic confirmation: Positive cytology, no positive histology Specimen type: Bronchial Biopsy Staged by: Managing physician Type of lung cancer: Resectable non-small cell lung cancer in inoperable patients treated with radiotherapy ECOG performance status: Grade 1 Symptoms: Absent Prognostic indicators: Non small cell lung cancer Stage used in treatment planning: Yes National guidelines used in treatment planning: Yes Type of national guideline  used in treatment planning: NCCN   05/23/2023 Initial Diagnosis   Primary cancer of right upper lobe of lung (HCC)     INTERVAL HISTORY:  Lindsay Brooks is here today for repeat clinical assessment for her stage IA3 non-small cell lung cancer and stage IB  triple negative left breast cancer. Patient states that she feels okay and has no complaints of pain. She is experiencing bloody diarrhea which is slowly improving and she is positive for C dif. She still experiences shortness of breath and dizziness when sitting up. She is understandably upset and grieving following the recent death of her youngest daughter. Her diagnostic mammogram on 02/02/2024 was negative. She continues to smoke, but has decreased to 1.5 packs per day. She has a WBC of 8.1, hemoglobin of 14.6, and platelet count of 180,000. Her CMP is normal. Her CEA is pending today. She has a cardiology appointment with Dr. Raylene on 04/28/2024. I will see her back in 3 months with CBC, CMP, CEA, and CT chest. She denies fever, chills, night sweats, or other signs of infection. She denies cardiorespiratory and gastrointestinal issues. She  denies pain. Her appetite is normal and Her weight has increased 2 pounds over last 3 months.She is accompanied in office today by her daughter.   REVIEW OF SYSTEMS:  Review of Systems  Constitutional:  Negative for appetite change, chills, diaphoresis, fatigue, fever and unexpected weight change.  HENT:   Positive for hearing loss. Negative for lump/mass, mouth sores, nosebleeds, sore throat, tinnitus, trouble swallowing and voice change.   Eyes: Negative.  Negative for eye problems and icterus.  Respiratory:  Positive for shortness of breath (chronic, on O2 as needed). Negative for chest tightness, cough, hemoptysis and wheezing.   Cardiovascular: Negative.  Negative for chest pain, leg swelling and palpitations.  Gastrointestinal:  Positive for diarrhea (with occasional blood due to C dif colitis). Negative for abdominal distention, abdominal pain, blood in stool, constipation, nausea, rectal pain and vomiting.  Endocrine: Negative.  Negative for hot flashes.  Genitourinary: Negative.  Negative for bladder incontinence, difficulty urinating, dyspareunia,  dysuria, frequency, hematuria, menstrual problem, nocturia, pelvic pain, vaginal bleeding and vaginal discharge.   Musculoskeletal: Negative.  Negative for arthralgias, back pain, flank pain, gait problem, myalgias, neck pain and neck stiffness.  Skin: Negative.  Negative for itching and rash.  Neurological:  Positive for numbness (extremities). Negative for dizziness, extremity weakness, gait problem, headaches, light-headedness, seizures and speech difficulty.  Hematological: Negative.  Negative for adenopathy. Does not bruise/bleed easily.  Psychiatric/Behavioral:  Positive for confusion. Negative for decreased concentration, depression, sleep disturbance and suicidal ideas. The patient is not nervous/anxious.      VITALS:  Blood pressure (!) 159/59, pulse 60, temperature 97.8 F (36.6 C), temperature source Oral, resp. rate 18, height 5' 1 (1.549 m), weight 126 lb 11.2 oz (57.5 kg), SpO2 98%.  Wt Readings from Last 3 Encounters:  04/21/24 126 lb 11.2 oz (57.5 kg)  01/13/24 124 lb 9.6 oz (56.5 kg)  10/14/23 111 lb 9.6 oz (50.6 kg)    Body mass index is 23.94 kg/m.  Performance status (ECOG): 1 - Symptomatic but completely ambulatory  PHYSICAL EXAM:  Physical Exam Vitals and nursing note reviewed. Exam conducted with a chaperone present.  Constitutional:      General: She is not in acute distress.    Appearance: Normal appearance. She is normal weight. She is not ill-appearing, toxic-appearing or diaphoretic.  HENT:     Head: Normocephalic and atraumatic.  Right Ear: Tympanic membrane, ear canal and external ear normal. There is no impacted cerumen.     Left Ear: Tympanic membrane, ear canal and external ear normal. There is no impacted cerumen.     Nose: Nose normal. No congestion or rhinorrhea.     Mouth/Throat:     Mouth: Mucous membranes are moist.     Pharynx: Oropharynx is clear. No oropharyngeal exudate or posterior oropharyngeal erythema.  Eyes:     General: No  scleral icterus.       Right eye: No discharge.        Left eye: No discharge.     Extraocular Movements: Extraocular movements intact.     Conjunctiva/sclera: Conjunctivae normal.     Pupils: Pupils are equal, round, and reactive to light.  Neck:     Vascular: No carotid bruit.  Cardiovascular:     Rate and Rhythm: Normal rate and regular rhythm.     Pulses: Normal pulses.     Heart sounds: Normal heart sounds. No murmur heard.    No friction rub. No gallop.  Pulmonary:     Effort: Pulmonary effort is normal. No respiratory distress.     Breath sounds: Normal breath sounds. No stridor. No wheezing, rhonchi or rales.  Chest:     Chest wall: No tenderness.     Comments: Scar in the right upper chest where she had a port Moderate fibrocystic changes in the right breast Well-healed scar in upper inner quadrant of the left breast Mild erythema and scarring of the left breast Mild fibrocystic changes in the lateral left breast Well-healed left axillary scar Abdominal:     General: Bowel sounds are normal. There is no distension.     Palpations: Abdomen is soft. There is no hepatomegaly, splenomegaly or mass.     Tenderness: There is no abdominal tenderness. There is no right CVA tenderness, left CVA tenderness, guarding or rebound.     Hernia: No hernia is present.  Musculoskeletal:        General: No swelling, tenderness, deformity or signs of injury. Normal range of motion.     Cervical back: Normal range of motion and neck supple. No rigidity or tenderness.     Right lower leg: No edema.     Left lower leg: No edema.  Lymphadenopathy:     Cervical: No cervical adenopathy.     Right cervical: No superficial, deep or posterior cervical adenopathy.    Left cervical: No superficial, deep or posterior cervical adenopathy.     Upper Body:     Right upper body: No supraclavicular, axillary or pectoral adenopathy.     Left upper body: No supraclavicular, axillary or pectoral adenopathy.      Lower Body: No right inguinal adenopathy. No left inguinal adenopathy.  Skin:    General: Skin is warm and dry.     Coloration: Skin is not jaundiced or pale.     Findings: No bruising, erythema, lesion or rash.  Neurological:     General: No focal deficit present.     Mental Status: She is alert and oriented to person, place, and time. Mental status is at baseline.     Cranial Nerves: No cranial nerve deficit.     Sensory: No sensory deficit.     Motor: No weakness.     Coordination: Coordination normal.     Gait: Gait normal.     Deep Tendon Reflexes: Reflexes normal.  Psychiatric:  Mood and Affect: Mood normal.        Behavior: Behavior normal.        Thought Content: Thought content normal.        Judgment: Judgment normal.    LABS:      Latest Ref Rng & Units 04/21/2024    2:15 PM 01/13/2024    3:18 PM 10/14/2023    3:40 PM  CBC  WBC 4.0 - 10.5 K/uL 8.1  8.4  5.7   Hemoglobin 12.0 - 15.0 g/dL 85.3  85.7  85.4   Hematocrit 36.0 - 46.0 % 44.4  43.6  44.4   Platelets 150 - 400 K/uL 180  216  214       Latest Ref Rng & Units 04/21/2024    2:15 PM 01/13/2024    3:18 PM 10/14/2023    3:40 PM  CMP  Glucose 70 - 99 mg/dL 94  898  894   BUN 8 - 23 mg/dL 18  22  15    Creatinine 0.44 - 1.00 mg/dL 9.06  9.04  9.14   Sodium 135 - 145 mmol/L 143  142  142   Potassium 3.5 - 5.1 mmol/L 3.5  3.7  3.5   Chloride 98 - 111 mmol/L 103  103  103   CO2 22 - 32 mmol/L 24  24  25    Calcium  8.9 - 10.3 mg/dL 9.2  9.8  9.7   Total Protein 6.5 - 8.1 g/dL 7.0  7.0  7.3   Total Bilirubin 0.0 - 1.2 mg/dL 0.3  0.3  0.2   Alkaline Phos 38 - 126 U/L 92  94  98   AST 15 - 41 U/L 18  23  18    ALT 0 - 44 U/L 5  7  6     Lab Results  Component Value Date   CEA 1.61 04/21/2024   /  CEA [facility])  Date Value Ref Range Status  04/21/2024 1.61 0.00 - 5.00 ng/mL Final    Comment:    (NOTE) This test was performed using Beckman Coulter's paramagnetic chemiluminescent immunoassay. Values  obtained from different assay methods cannot be used interchangeably. Please note that up to 8% of patients who smoke may see values 5.1-10.0 ng/ml and 1% of patients who smoke may see CEA levels >10.0 ng/ml. Performed at [facility], [address] [place], [place] [address]    No results found for: PSA1 No results found for: CAN199 No results found for: CAN125  No results found for: STEPHANY CARLOTA BENSON MARKEL EARLA JOANNIE, GAMS, MSPIKE, SPEI Lab Results  Component Value Date   TIBC 368 10/14/2023   TIBC 553 (H) 06/03/2023   TIBC 420 04/09/2023   FERRITIN 31 10/14/2023   FERRITIN 3 (L) 06/03/2023   FERRITIN 11 10/20/2022   IRONPCTSAT 18 10/14/2023   IRONPCTSAT 4 (L) 06/03/2023   IRONPCTSAT 8 (L) 04/09/2023   No results found for: LDH  STUDIES:  EXAM: 02/02/2024 MAM DIGITAL W/TOMO DIAG B IMPRESSION: Overall final assessment: BIRADS 2 BENIGN FINDING  EXAM: 12/23/2023 CT CHEST W CONTRAST IMPRESSION: 1. 2.3 cm ground-glass opacity in the central right upper lobe, corresponding to the patient's known non-small cell lung cancer, with associated radiation changes.  HISTORY:   Past Medical History:  Diagnosis Date   Anemia    Arthritis    CAD (coronary artery disease)    a. s/p 2 CoStar DES to RCA 06/2004. b.  LHC 12/09:  EF 65%, oCFX 40%, oRCA 30%, then  30-40% leading into stented seg that is patent, (10-20% ISR), 40% beyond stent,;  c. EF 81%, poor exercise capacity, hypotensive BP response, + chest pain, normal nuclear images (overall low risk)   CKD (chronic kidney disease)    Colon polyps    a. s/p removal 01/2012 - was told one might be cancerous but was not diagnosed with colon cancer - plan is for f/u colonoscopy in 5 yrs.   COPD (chronic obstructive pulmonary disease) (HCC)    Depression    GERD (gastroesophageal reflux disease)    Hx of echocardiogram    a. Echo 06/2011: EF 55-60%, Gr 1 diast dysfn,  mild MR   Insomnia    Mini stroke    History of, in 2005 (before she was diagnosed with CAD).   Osteoporosis    Other and unspecified hyperlipidemia    Other isolated or specific phobias    Peripheral vascular disease, unspecified (HCC)    a. s/p L common femoral & left illiac stenting previously (patent by angio in 2007). - ABI 06/2011 normal. b. Carotid US  0-39% BICA 06/2011 (for f/u 2 yrs);   c.  ABIs 9/13:  normal (1.1 bilaterally)   Personal history of peptic ulcer disease    Remotely.   Psychiatric pseudoseizure    per patient and family   TIA (transient ischemic attack)    Tobacco use disorder    Type II or unspecified type diabetes mellitus without mention of complication, not stated as uncontrolled    Unspecified essential hypertension     Past Surgical History:  Procedure Laterality Date   APPENDECTOMY     BREAST BIOPSY     BRONCHIAL BIOPSY  04/19/2023   Procedure: BRONCHIAL BIOPSIES;  Surgeon: [name] Lamar RAMAN, MD;  Location: [facility] ENDOSCOPY;  Service: Pulmonary;;   BRONCHIAL BRUSHINGS  04/19/2023   Procedure: BRONCHIAL BRUSHINGS;  Surgeon: [name] Lamar RAMAN, MD;  Location: [facility] ENDOSCOPY;  Service: Pulmonary;;   BRONCHIAL NEEDLE ASPIRATION BIOPSY  04/19/2023   Procedure: BRONCHIAL NEEDLE ASPIRATION BIOPSIES;  Surgeon: [name] Lamar RAMAN, MD;  Location: [facility] ENDOSCOPY;  Service: Pulmonary;;   BRONCHIAL WASHINGS  04/19/2023   Procedure: BRONCHIAL WASHINGS;  Surgeon: [name] Lamar RAMAN, MD;  Location: [facility] ENDOSCOPY;  Service: Pulmonary;;   CARDIAC CATHETERIZATION  2009   patent RCA stent. mild CAD otherwise.    CATARACT EXTRACTION, BILATERAL  2016   CHOLECYSTECTOMY  2009   FIDUCIAL MARKER PLACEMENT  04/19/2023   Procedure: FIDUCIAL MARKER PLACEMENT;  Surgeon: [name] Lamar RAMAN, MD;  Location: [facility] ENDOSCOPY;  Service: Pulmonary;;   FLEXIBLE SIGMOIDOSCOPY N/A 10/21/2022   Procedure: ENID MORIN;  Surgeon: Kristie Lamprey, MD;  Location: [facility] ENDOSCOPY;  Service: Gastroenterology;  Laterality:  N/A;   hysterectomy (other)     age 73   KNEE ARTHROSCOPY Left    left arm surgery     LEFT HEART CATHETERIZATION WITH CORONARY ANGIOGRAM N/A 01/16/2014   Procedure: LEFT HEART CATHETERIZATION WITH CORONARY ANGIOGRAM;  Surgeon: Ozell JONETTA Fell, MD;  Location: [facility] CATH LAB;  Service: Cardiovascular;  Laterality: N/A;   left leg angiography     stenting of the left common iliac artery   left rotator cuff repair     right coronary artery stent      Family History  Problem Relation Age of Onset   Heart attack Mother    Allergies Mother    Heart attack Father    Allergies Father    Kidney disease Father    Hypertension Father  Melanoma Sister    Coronary artery disease Brother    Breast cancer Maternal Aunt    Breast cancer Maternal Aunt    Breast cancer Paternal Aunt        d. under 22   Pancreatic cancer Maternal Grandmother    Breast cancer Daughter        BRCA1+    Social History:  reports that she has been smoking cigarettes. She has a 66 pack-year smoking history. She has never used smokeless tobacco. She reports that she does not drink alcohol  and does not use drugs.The patient is accompanied by her daughter today.  Allergies:  Allergies  Allergen Reactions   Amoxicillin Hives and Nausea And Vomiting   Claritin [Loratadine] Other (See Comments)    Pt's daughter notified us  of this allergy (reaction?)   Insulins Nausea And Vomiting and Other (See Comments)    Patient unsure of name of insulin    Penicillins Other (See Comments)   Vioxx [Rofecoxib] Hives   Celecoxib Hives and Nausea And Vomiting   Liraglutide Nausea And Vomiting and Other (See Comments)    Victoza   Propranolol Diarrhea and Nausea And Vomiting    Current Medications: Current Outpatient Medications  Medication Sig Dispense Refill   vancomycin (VANCOCIN) 125 MG capsule Take 125 mg by mouth 4 (four) times daily.     acetaminophen  (TYLENOL ) 500 MG tablet Take 1,000 mg by mouth as needed for moderate  pain.     ALPRAZolam  (XANAX ) 0.25 MG tablet Take 1 tablet (0.25 mg total) by mouth 2 (two) times daily as needed for anxiety. 20 tablet 0   aspirin  EC 81 MG tablet Take 1 tablet (81 mg total) by mouth daily. 30 tablet 11   busPIRone  (BUSPAR ) 7.5 MG tablet Take 1 tablet (7.5 mg total) by mouth daily as needed (for anxiety). 30 tablet 0   clopidogrel  (PLAVIX ) 75 MG tablet Take 1 tablet (75 mg total) by mouth daily. 30 tablet 0   divalproex  (DEPAKOTE ) 250 MG DR tablet Take 1 tablet (250 mg total) by mouth 2 (two) times daily. 30 tablet 0   donepezil (ARICEPT) 5 MG tablet Take 5 mg by mouth daily.     DULoxetine  (CYMBALTA ) 60 MG capsule Take 60 mg by mouth at bedtime.     ezetimibe  (ZETIA ) 10 MG tablet Take 10 mg by mouth daily.     famotidine  (PEPCID ) 20 MG tablet Take 20 mg by mouth 2 (two) times daily.     gabapentin (NEURONTIN) 100 MG capsule Take 100 mg by mouth 2 (two) times daily.     hydrALAZINE  (APRESOLINE ) 50 MG tablet Take 50 mg by mouth in the morning and at bedtime.     ipratropium-albuterol  (DUONEB) 0.5-2.5 (3) MG/3ML SOLN Take 3 mLs by nebulization every 6 (six) hours as needed (for shortness of breath or wheezing).     JARDIANCE  25 MG TABS tablet Take 1 tablet (25 mg total) by mouth daily. 30 tablet 2   metoprolol  tartrate (LOPRESSOR ) 25 MG tablet Take 25 mg by mouth 2 (two) times daily.     nitroGLYCERIN  (NITROSTAT ) 0.4 MG SL tablet Place 0.4 mg under the tongue every 5 (five) minutes as needed for chest pain.     pantoprazole  (PROTONIX ) 40 MG tablet Take 40 mg by mouth 2 (two) times daily.     PROLIA  60 MG/ML SOSY injection Inject 60 mg into the skin every 6 (six) months.     XIFAXAN 550 MG TABS tablet Take 550 mg by  mouth 3 (three) times daily.     zonisamide  (ZONEGRAN ) 100 MG capsule Take 200 mg by mouth at bedtime.     No current facility-administered medications for this visit.    I,Huber Mathers H Malika Demario,acting as a scribe for Lindsay VEAR Cornish, MD.,have documented all  relevant documentation on the behalf of Lindsay VEAR Cornish, MD,as directed by  Lindsay VEAR Cornish, MD while in the presence of Lindsay VEAR Cornish, MD.

## 2024-04-27 ENCOUNTER — Encounter: Payer: Self-pay | Admitting: Hematology and Oncology

## 2024-04-28 DIAGNOSIS — E785 Hyperlipidemia, unspecified: Secondary | ICD-10-CM | POA: Diagnosis not present

## 2024-04-28 DIAGNOSIS — I2510 Atherosclerotic heart disease of native coronary artery without angina pectoris: Secondary | ICD-10-CM | POA: Diagnosis not present

## 2024-04-28 DIAGNOSIS — I10 Essential (primary) hypertension: Secondary | ICD-10-CM | POA: Diagnosis not present

## 2024-04-28 DIAGNOSIS — Z720 Tobacco use: Secondary | ICD-10-CM | POA: Diagnosis not present

## 2024-05-04 ENCOUNTER — Encounter: Payer: Self-pay | Admitting: Hematology and Oncology

## 2024-05-10 DIAGNOSIS — E1169 Type 2 diabetes mellitus with other specified complication: Secondary | ICD-10-CM | POA: Diagnosis not present

## 2024-05-10 DIAGNOSIS — I10 Essential (primary) hypertension: Secondary | ICD-10-CM | POA: Diagnosis not present

## 2024-05-15 DIAGNOSIS — R1310 Dysphagia, unspecified: Secondary | ICD-10-CM | POA: Diagnosis not present

## 2024-05-21 DIAGNOSIS — R0603 Acute respiratory distress: Secondary | ICD-10-CM | POA: Diagnosis not present

## 2024-07-13 ENCOUNTER — Other Ambulatory Visit (HOSPITAL_BASED_OUTPATIENT_CLINIC_OR_DEPARTMENT_OTHER): Admitting: Radiology

## 2024-07-21 ENCOUNTER — Inpatient Hospital Stay: Admitting: Oncology

## 2024-08-01 ENCOUNTER — Inpatient Hospital Stay: Attending: Oncology

## 2024-08-01 ENCOUNTER — Encounter: Payer: Self-pay | Admitting: Hematology and Oncology

## 2024-08-01 ENCOUNTER — Ambulatory Visit (INDEPENDENT_AMBULATORY_CARE_PROVIDER_SITE_OTHER)
Admission: RE | Admit: 2024-08-01 | Discharge: 2024-08-01 | Disposition: A | Discharge status: Home / Self Care | Source: Ambulatory Visit | Attending: Oncology | Admitting: Oncology

## 2024-08-01 DIAGNOSIS — C3411 Malignant neoplasm of upper lobe, right bronchus or lung: Secondary | ICD-10-CM | POA: Diagnosis not present

## 2024-08-01 DIAGNOSIS — F17210 Nicotine dependence, cigarettes, uncomplicated: Secondary | ICD-10-CM | POA: Diagnosis not present

## 2024-08-01 DIAGNOSIS — Z85118 Personal history of other malignant neoplasm of bronchus and lung: Secondary | ICD-10-CM | POA: Insufficient documentation

## 2024-08-01 LAB — CBC WITH DIFFERENTIAL (CANCER CENTER ONLY)
Abs Immature Granulocytes: 0.01 K/uL (ref 0.00–0.07)
Basophils Absolute: 0.1 K/uL (ref 0.0–0.1)
Basophils Relative: 1 %
Eosinophils Absolute: 0.3 K/uL (ref 0.0–0.5)
Eosinophils Relative: 5 %
HCT: 43.7 % (ref 36.0–46.0)
Hemoglobin: 14.4 g/dL (ref 12.0–15.0)
Immature Granulocytes: 0 %
Lymphocytes Relative: 23 %
Lymphs Abs: 1.4 K/uL (ref 0.7–4.0)
MCH: 33 pg (ref 26.0–34.0)
MCHC: 33 g/dL (ref 30.0–36.0)
MCV: 100 fL (ref 80.0–100.0)
Monocytes Absolute: 0.3 K/uL (ref 0.1–1.0)
Monocytes Relative: 6 %
Neutro Abs: 3.7 K/uL (ref 1.7–7.7)
Neutrophils Relative %: 65 %
Platelet Count: 185 K/uL (ref 150–400)
RBC: 4.37 MIL/uL (ref 3.87–5.11)
RDW: 12.3 % (ref 11.5–15.5)
WBC Count: 5.8 K/uL (ref 4.0–10.5)
nRBC: 0 % (ref 0.0–0.2)

## 2024-08-01 LAB — CMP (CANCER CENTER ONLY)
ALT: <5 U/L (ref 0–44)
AST: 21 U/L (ref 15–41)
Albumin: 3.8 g/dL (ref 3.5–5.0)
Alkaline Phosphatase: 96 U/L (ref 38–126)
Anion gap: 10 (ref 5–15)
BUN: 20 mg/dL (ref 8–23)
CO2: 23 mmol/L (ref 22–32)
Calcium: 9.2 mg/dL (ref 8.9–10.3)
Chloride: 109 mmol/L (ref 98–111)
Creatinine: 0.89 mg/dL (ref 0.44–1.00)
GFR, Estimated: >60 mL/min
Glucose, Bld: 138 mg/dL — ABNORMAL HIGH (ref 70–99)
Potassium: 4.1 mmol/L (ref 3.5–5.1)
Sodium: 142 mmol/L (ref 135–145)
Total Bilirubin: 0.2 mg/dL (ref 0.0–1.2)
Total Protein: 6.5 g/dL (ref 6.5–8.1)

## 2024-08-01 LAB — CEA (ACCESS): CEA (CHCC): 1.56 ng/mL (ref 0.00–5.00)

## 2024-08-01 MED ORDER — IOHEXOL 300 MG/ML  SOLN
75.0000 mL | Freq: Once | INTRAMUSCULAR | Status: AC | PRN
  Administered 2024-08-01: 75 mL via INTRAVENOUS

## 2024-08-02 ENCOUNTER — Inpatient Hospital Stay: Attending: Hematology and Oncology | Admitting: Oncology

## 2024-08-02 ENCOUNTER — Telehealth: Payer: Self-pay | Admitting: Oncology

## 2024-08-02 ENCOUNTER — Encounter: Payer: Self-pay | Admitting: Oncology

## 2024-08-02 ENCOUNTER — Other Ambulatory Visit: Payer: Self-pay | Admitting: Oncology

## 2024-08-02 VITALS — BP 143/66 | HR 69 | Temp 98.1°F | Resp 18 | Ht 61.0 in | Wt 130.3 lb

## 2024-08-02 DIAGNOSIS — Z9221 Personal history of antineoplastic chemotherapy: Secondary | ICD-10-CM | POA: Diagnosis not present

## 2024-08-02 DIAGNOSIS — R569 Unspecified convulsions: Secondary | ICD-10-CM | POA: Diagnosis not present

## 2024-08-02 DIAGNOSIS — Z853 Personal history of malignant neoplasm of breast: Secondary | ICD-10-CM | POA: Insufficient documentation

## 2024-08-02 DIAGNOSIS — C50412 Malignant neoplasm of upper-outer quadrant of left female breast: Secondary | ICD-10-CM

## 2024-08-02 DIAGNOSIS — F0393 Unspecified dementia, unspecified severity, with mood disturbance: Secondary | ICD-10-CM | POA: Insufficient documentation

## 2024-08-02 DIAGNOSIS — Z8639 Personal history of other endocrine, nutritional and metabolic disease: Secondary | ICD-10-CM | POA: Insufficient documentation

## 2024-08-02 DIAGNOSIS — F4321 Adjustment disorder with depressed mood: Secondary | ICD-10-CM

## 2024-08-02 DIAGNOSIS — Z923 Personal history of irradiation: Secondary | ICD-10-CM | POA: Diagnosis not present

## 2024-08-02 DIAGNOSIS — Z171 Estrogen receptor negative status [ER-]: Secondary | ICD-10-CM

## 2024-08-02 DIAGNOSIS — C3411 Malignant neoplasm of upper lobe, right bronchus or lung: Secondary | ICD-10-CM

## 2024-08-02 DIAGNOSIS — Z79899 Other long term (current) drug therapy: Secondary | ICD-10-CM | POA: Diagnosis not present

## 2024-08-02 DIAGNOSIS — F17210 Nicotine dependence, cigarettes, uncomplicated: Secondary | ICD-10-CM | POA: Insufficient documentation

## 2024-08-02 DIAGNOSIS — Z1231 Encounter for screening mammogram for malignant neoplasm of breast: Secondary | ICD-10-CM

## 2024-08-02 DIAGNOSIS — M810 Age-related osteoporosis without current pathological fracture: Secondary | ICD-10-CM | POA: Diagnosis not present

## 2024-08-02 MED ORDER — ALPRAZOLAM 0.25 MG PO TABS: 0.2500 mg | ORAL_TABLET | Freq: Two times a day (BID) | ORAL | 0 refills | Status: AC | PRN

## 2024-08-02 NOTE — Progress Notes (Signed)
 " [facility]  [address] [place],  [place]  [address] [phone]  Clinic Day: 08/02/24  Referring physician: [name], MD  ASSESSMENT & PLAN:  Assessment: Lung cancer, upper lobe (HCC) Non-small cell lung cancer of the right upper lobe, clinical stage IA3 (T1c N0 M0).  CT chest, abdomen and pelvis done in the ER July 22 after a fall revealed a 25 x 22 mm right upper lobe groundglass nodule increased in size from September 2023.  PET on August 16 revealed 2.5 cm subsolid nodule in the central right upper lobe, suspicious for primary bronchogenic carcinoma.  No findings suspicious for metastatic disease. There are radiation changes involving the left breast and anterior left upper lobe. Multiple left anterolateral rib fracture deformities, including a mildly displaced left lateral 9th rib. There was also a hypermetabolic 12 mm right isthmic thyroid  nodule.She was referred to Dr. [name] and underwent bronchoscopy on September 9.  Brushings revealed malignant cells consistent with non-small cell lung cancer. MRI brain did not reveal any evidence of intracranial metastasis. She was referred to [name] who administered SBRT for her lung cancer. CTA chest on 07/13/2023 did not reveal any evidence of pulmonary embolism.  There was continued subpleural nodularity and thickening anteriorly in the lingular segment of left upper lobe which may be increased compared to prior exam, suggesting worsening inflammation. Grossly stable semi solid density seen centrally in right upper lobe. She has been back to [name] and finally completed SBRT to the right upper lobe mass. CT chest done on 08/01/2024 which revealed redemonstration of linear irregular bandlike opacity traversing horizontally from the right hilum, there is a fiducial marker in the adjacent proximal segmental bronchus that is unchanged. Previously seen discrete ground-glass nodule is inseparable from this opacity on today's exam, which  is likely due to post radiation changes, the linear bandlike opacity appears thickened compared to the prior exam however, likely secondary to adjacent atelectasis, and no new or suspicious lung nodules, no new mass or consolidation, and no thoracic lymphadenopathy were found.   Pseudoseizures She was admitted December 2 with altered mental status. This was felt to be due to pseudoseizure, which she had evaluated in 2022.   Neurology was consulted.  EEG was normal.  MRI brain during her hospitalization did not reveal any evidence of intracranial abnormality. Psychiatry referral as an outpatient was recommended to standardize medications, she has polypharmacy and was not probably taking all the meds she needs.  She was given a very low dose of Xanax  twice daily given the functional nature of her disorder.   Breast cancer of upper-outer quadrant of left female breast (HCC) Stage IB triple negative breast cancer. She had difficulty tolerating chemotherapy with docetaxel /cyclophosphamide  despite dose reduction.  She completed chemotherapy in December, 2023.  She received adjuvant radiation to the left breast completed in early 2024.  Bilateral diagnostic mammogram in June of 2025 did not reveal any evidence of malignancy.  She remains without evidence of recurrence.   Iron  deficiency anemia Iron  deficiency anemia. She did not tolerate oral iron . She received IV iron  when hospitalized December 2024. We continued this as an outpatient and she completed 5 doses of iron  sucrose on 07/29/2023. This has now resolved with a hemoglobin of 14.4.   Dementia She is on Aricept and low dose Xanax .   Plan: She informed me that she has increased her smoking to almost 3 packs a day since the death of her youngest daughter. I will refill  her xanax  0.25mg  today. She had a CT chest done on 08/01/2024 which revealed redemonstration of linear irregular bandlike opacity traversing horizontally from the right hilum, there is a  fiducial marker in the adjacent proximal segmental bronchus that is unchanged. Previously seen discrete ground-glass nodule is inseparable from this opacity on today's exam, which is likely due to post radiation changes, the linear bandlike opacity appears thickened compared to the prior exam however, likely secondary to adjacent atelectasis, and no new or suspicious lung nodules, no new mass or consolidation, and no thoracic lymphadenopathy were found. She has a WBC of 5.8, hemoglobin of 14.4, and platelet count of 185,000. Her CMP is normal and her CEA is within normal range at 1.56. I will see her back in 6 months with CBC, CMP, CEA, and CT chest one week before her appointment with me. The patient and her daughter understand the plans discussed today and are in agreement with them.  They know  to contact our office if she develops concerns prior to her next appointment.  I provided 15 minutes of face-to-face time during this encounter and > 50% was spent counseling as documented under my assessment and plan.   Lindsay VEAR Cornish, MD [facility] OF [facility] [address] [place] [address] Dept: [phone] Dept Fax: [phone]   No orders of the defined types were placed in this encounter.  CHIEF COMPLAINT:  CC: Stage IA3 non-small cell lung cancer 2024, hx triple negative breast cancer 2023  Current Treatment: Surveillance  HISTORY OF PRESENT ILLNESS:   Oncology History  Breast cancer of upper-outer quadrant of left female breast (HCC)  03/18/2022 Initial Diagnosis   Breast cancer of upper-outer quadrant of left female breast (HCC)   04/28/2022 Cancer Staging   Staging form: Breast, AJCC 8th Edition - Clinical stage from 04/28/2022: Stage IB (cT1c, cN0(sn), cM0, G3, ER-, PR-, HER2-) - Signed by [name], MD on 04/28/2022 Histopathologic type: Infiltrating duct carcinoma, NOS Stage prefix: Initial  diagnosis Method of lymph node assessment: Sentinel lymph node biopsy Nuclear grade: G3 Multigene prognostic tests performed: None Histologic grading system: 3 grade system Laterality: Left Tumor size (mm): 20 Lymph-vascular invasion (LVI): LVI not present (absent)/not identified Diagnostic confirmation: Positive histology Specimen type: Excision Staged by: Managing physician Menopausal status: Postmenopausal Ki-67 (%): 40 Stage used in treatment planning: Yes National guidelines used in treatment planning: Yes Type of national guideline used in treatment planning: NCCN   05/07/2022 - 07/14/2022 Chemotherapy   Patient is on Treatment Plan : BREAST TC q21d     05/19/2022 Genetic Testing   Negative hereditary cancer genetic testing: no pathogenic variant detected in [facility] Common Hereditary Cancers +RNA Panel.  Report date is May 19, 2022.    The Common Hereditary Cancers + RNA Panel offered by [facility] includes sequencing, deletion/duplication, and RNA testing of the following 47 genes: APC, ATM, AXIN2, BARD1, BMPR1A, BRCA1, BRCA2, BRIP1, CDH1, CDK4*, CDKN2A (p14ARF)*, CDKN2A (p16INK4a)*, CHEK2, CTNNA1, DICER1, EPCAM (Deletion/duplication testing only), GREM1 (promoter region deletion/duplication testing only), KIT, MEN1, MLH1, MSH2, MSH3, MSH6, MUTYH, NBN, NF1, NHTL1, PALB2, PDGFRA*, PMS2, POLD1, POLE, PTEN, RAD50, RAD51C, RAD51D, SDHB, SDHC, SDHD, SMAD4, SMARCA4. STK11, TP53, TSC1, TSC2, and VHL.  The following genes were evaluated for sequence changes only: SDHA and HOXB13 c.251G>A variant only.  RNA analysis is not performed for the * genes.     Primary cancer of right upper lobe of lung (HCC)  04/19/2023 Cancer Staging   Staging form: Lung, AJCC 8th Edition - Clinical stage from 04/19/2023: Stage IA3 (cT1c, cN0, cM0) - Signed by Cornelius Lindsay DEL, MD on 05/23/2023 Histopathologic type: Carcinoma, NOS Stage prefix: Initial diagnosis Laterality: Right Tumor size (mm):  25 Lymph-vascular invasion (LVI): Presence of LVI unknown/indeterminate Diagnostic confirmation: Positive cytology, no positive histology Specimen type: Bronchial Biopsy Staged by: Managing physician Type of lung cancer: Resectable non-small cell lung cancer in inoperable patients treated with radiotherapy ECOG performance status: Grade 1 Symptoms: Absent Prognostic indicators: Non small cell lung cancer Stage used in treatment planning: Yes National guidelines used in treatment planning: Yes Type of national guideline used in treatment planning: NCCN   05/23/2023 Initial Diagnosis   Primary cancer of right upper lobe of lung (HCC)     INTERVAL HISTORY:  Bristal is here today for repeat clinical assessment for her stage IA3 non-small cell lung cancer September of 2024 and stage IB triple negative left breast cancer August of 2023. Patient states that she feels well and has no complaints of pain. She informed me that she has increased her smoking to almost 3 packs a day since the death of her youngest daughter this year. I will refill her xanax  0.25 mg today. She had a CT chest done on 08/01/2024 which revealed redemonstration of linear irregular bandlike opacity traversing horizontally from the right hilum, there is a fiducial marker in the adjacent proximal segmental bronchus that is unchanged. Previously seen discrete ground-glass nodule is inseparable from this opacity on today's exam, which is likely due to post radiation changes, the linear bandlike opacity appears thickened compared to the prior exam however, likely secondary to adjacent atelectasis, and no new or suspicious lung nodules, no new mass or consolidation, and no thoracic lymphadenopathy were found. She has a WBC of 5.8, hemoglobin of 14.4, and platelet count of 185,000. Her CMP is normal and her CEA is within normal range at 1.56. I will see her back in 6 months with CBC, CMP, CEA, and CT chest one week before her appointment  with me. She denies fever, chills, night sweats, or other signs of infection. She denies cardiorespiratory and gastrointestinal issues. She  denies pain. Her appetite is good and Her weight has increased 4 pounds over last 3 months. This patient is accompanied by her daughter.   REVIEW OF SYSTEMS:  Review of Systems  Constitutional:  Negative for appetite change, chills, diaphoresis, fatigue, fever and unexpected weight change.  HENT:   Positive for hearing loss. Negative for lump/mass, mouth sores, nosebleeds, sore throat, tinnitus, trouble swallowing and voice change.   Eyes: Negative.  Negative for eye problems and icterus.  Respiratory:  Positive for shortness of breath (chronic, on O2 as needed). Negative for chest tightness, cough, hemoptysis and wheezing.   Cardiovascular: Negative.  Negative for chest pain, leg swelling and palpitations.  Gastrointestinal:  Negative for abdominal distention, abdominal pain, blood in stool, constipation, diarrhea, nausea, rectal pain and vomiting.  Endocrine: Negative.  Negative for hot flashes.  Genitourinary: Negative.  Negative for bladder incontinence, difficulty urinating, dyspareunia, dysuria, frequency, hematuria, menstrual problem, nocturia, pelvic pain, vaginal bleeding and vaginal discharge.   Musculoskeletal: Negative.  Negative for arthralgias, back pain, flank pain, gait problem, myalgias, neck pain and neck stiffness.  Skin: Negative.  Negative for itching and rash.  Neurological:  Positive for numbness (extremities). Negative for dizziness, extremity weakness, gait problem, headaches, light-headedness, seizures and speech difficulty.       Tremor  Hematological: Negative.  Negative for adenopathy. Does not bruise/bleed easily.  Psychiatric/Behavioral:  Positive for confusion. Negative for decreased concentration, depression, sleep disturbance and suicidal ideas. The patient is not nervous/anxious.     VITALS:  Blood pressure (!) 143/66,  pulse 69, temperature 98.1 F (36.7 C), temperature source Oral, resp. rate 18, height 5' 1 (1.549 m), weight 130 lb 4.8 oz (59.1 kg), SpO2 97%.  Wt Readings from Last 3 Encounters:  08/02/24 130 lb 4.8 oz (59.1 kg)  04/21/24 126 lb 11.2 oz (57.5 kg)  01/13/24 124 lb 9.6 oz (56.5 kg)    Body mass index is 24.62 kg/m.  Performance status (ECOG): 1 - Symptomatic but completely ambulatory  PHYSICAL EXAM:  Physical Exam Vitals and nursing note reviewed. Exam conducted with a chaperone present.  Constitutional:      General: She is not in acute distress.    Appearance: Normal appearance. She is normal weight. She is not ill-appearing, toxic-appearing or diaphoretic.  HENT:     Head: Normocephalic and atraumatic.     Right Ear: Tympanic membrane, ear canal and external ear normal. There is no impacted cerumen.     Left Ear: Tympanic membrane, ear canal and external ear normal. There is no impacted cerumen.     Nose: Nose normal. No congestion or rhinorrhea.     Mouth/Throat:     Mouth: Mucous membranes are moist.     Pharynx: Oropharynx is clear. No oropharyngeal exudate or posterior oropharyngeal erythema.  Eyes:     General: No scleral icterus.       Right eye: No discharge.        Left eye: No discharge.     Extraocular Movements: Extraocular movements intact.     Conjunctiva/sclera: Conjunctivae normal.     Pupils: Pupils are equal, round, and reactive to light.  Neck:     Vascular: No carotid bruit.  Cardiovascular:     Rate and Rhythm: Normal rate and regular rhythm.     Pulses: Normal pulses.     Heart sounds: Normal heart sounds. No murmur heard.    No friction rub. No gallop.  Pulmonary:     Effort: Pulmonary effort is normal. No respiratory distress.     Breath sounds: Normal breath sounds. No stridor. No wheezing, rhonchi or rales.  Chest:     Chest wall: No tenderness.     Comments: Well healed scar in the right upper chest at the site of her prior port Well  healed scar in the upper inner quadrant of the left breast Scars in the periareolar area at the site of her injection Healing scar in the left axilla Abdominal:     General: Bowel sounds are normal. There is no distension.     Palpations: Abdomen is soft. There is no hepatomegaly, splenomegaly or mass.     Tenderness: There is no abdominal tenderness. There is no right CVA tenderness, left CVA tenderness, guarding or rebound.     Hernia: No hernia is present.  Musculoskeletal:        General: No swelling, tenderness, deformity or signs of injury. Normal range of motion.     Cervical back: Normal range of motion and neck supple. No rigidity or tenderness.     Right lower leg: No edema.     Left lower leg: No edema.  Lymphadenopathy:     Cervical: No cervical adenopathy.     Right cervical: No superficial, deep or posterior cervical adenopathy.    Left  cervical: No superficial, deep or posterior cervical adenopathy.     Upper Body:     Right upper body: No supraclavicular, axillary or pectoral adenopathy.     Left upper body: No supraclavicular, axillary or pectoral adenopathy.     Lower Body: No right inguinal adenopathy. No left inguinal adenopathy.  Skin:    General: Skin is warm and dry.     Coloration: Skin is not jaundiced or pale.     Findings: No bruising, erythema, lesion or rash.  Neurological:     General: No focal deficit present.     Mental Status: She is alert and oriented to person, place, and time. Mental status is at baseline.     Cranial Nerves: No cranial nerve deficit.     Sensory: No sensory deficit.     Motor: Tremor present. No weakness.     Coordination: Coordination normal.     Gait: Gait normal.     Deep Tendon Reflexes: Reflexes normal.  Psychiatric:        Mood and Affect: Mood normal.        Behavior: Behavior normal.        Thought Content: Thought content normal.        Judgment: Judgment normal.    LABS:      Latest Ref Rng & Units 08/01/2024     1:32 PM 04/21/2024    2:15 PM 01/13/2024    3:18 PM  CBC  WBC 4.0 - 10.5 K/uL 5.8  8.1  8.4   Hemoglobin 12.0 - 15.0 g/dL 85.5  85.3  85.7   Hematocrit 36.0 - 46.0 % 43.7  44.4  43.6   Platelets 150 - 400 K/uL 185  180  216       Latest Ref Rng & Units 08/01/2024    1:32 PM 04/21/2024    2:15 PM 01/13/2024    3:18 PM  CMP  Glucose 70 - 99 mg/dL 861  94  898   BUN 8 - 23 mg/dL 20  18  22    Creatinine 0.44 - 1.00 mg/dL 9.10  9.06  9.04   Sodium 135 - 145 mmol/L 142  143  142   Potassium 3.5 - 5.1 mmol/L 4.1  3.5  3.7   Chloride 98 - 111 mmol/L 109  103  103   CO2 22 - 32 mmol/L 23  24  24    Calcium  8.9 - 10.3 mg/dL 9.2  9.2  9.8   Total Protein 6.5 - 8.1 g/dL 6.5  7.0  7.0   Total Bilirubin 0.0 - 1.2 mg/dL 0.2  0.3  0.3   Alkaline Phos 38 - 126 U/L 96  92  94   AST 15 - 41 U/L 21  18  23    ALT 0 - 44 U/L <5  <5  7    Lab Results  Component Value Date   CEA 1.56 08/01/2024   /  CEA [facility])  Date Value Ref Range Status  08/01/2024 1.56 0.00 - 5.00 ng/mL Final    Comment:    (NOTE) This test was performed using Beckman Coulter's paramagnetic chemiluminescent immunoassay. Values obtained from different assay methods cannot be used interchangeably. Please note that up to 8% of patients who smoke may see values 5.1-10.0 ng/ml and 1% of patients who smoke may see CEA levels >10.0 ng/ml. Performed at [facility], [address] [place], [place] [address]    No results found for: PSA1 No results found for: CAN199  No results found for: CAN125  No results found for: STEPHANY RINGS, A1GS, A2GS, EARLA BABCOCK, GAMS, MSPIKE, SPEI Lab Results  Component Value Date   TIBC 368 10/14/2023   TIBC 553 (H) 06/03/2023   TIBC 420 04/09/2023   FERRITIN 31 10/14/2023   FERRITIN 3 (L) 06/03/2023   FERRITIN 11 10/20/2022   IRONPCTSAT 18 10/14/2023   IRONPCTSAT 4 (L) 06/03/2023   IRONPCTSAT 8 (L) 04/09/2023   No results found for:  LDH  STUDIES:  EXAM: 08/01/2024 CT CHEST WITH CONTRAST IMPRESSION: 1. Redemonstration of linear irregular bandlike opacity traversing horizontally from the right hilum. There is a fiducial marker in the adjacent proximal segmental bronchus, unchanged. Previously seen discrete ground-glass nodule is inseparable from this opacity on today's exam, which is likely due to post radiation changes. The linear bandlike opacity appears thickened compared to the prior exam however, likely secondary to adjacent atelectasis. Attention on follow-up exam is recommended to exclude slowly growing local tumor. 2. No new or suspicious lung nodules. No new mass or consolidation. No thoracic lymphadenopathy. 3. Multiple other nonacute observations, as described above.  HISTORY:   Past Medical History:  Diagnosis Date   Anemia    Arthritis    CAD (coronary artery disease)    a. s/p 2 CoStar DES to RCA 06/2004. b.  LHC 12/09:  EF 65%, oCFX 40%, oRCA 30%, then 30-40% leading into stented seg that is patent, (10-20% ISR), 40% beyond stent,;  c. EF 81%, poor exercise capacity, hypotensive BP response, + chest pain, normal nuclear images (overall low risk)   CKD (chronic kidney disease)    Colon polyps    a. s/p removal 01/2012 - was told one might be cancerous but was not diagnosed with colon cancer - plan is for f/u colonoscopy in 5 yrs.   COPD (chronic obstructive pulmonary disease) (HCC)    Depression    GERD (gastroesophageal reflux disease)    Hx of echocardiogram    a. Echo 06/2011: EF 55-60%, Gr 1 diast dysfn, mild MR   Insomnia    Mini stroke    History of, in 2005 (before she was diagnosed with CAD).   Osteoporosis    Other and unspecified hyperlipidemia    Other isolated or specific phobias    Peripheral vascular disease, unspecified    a. s/p L common femoral & left illiac stenting previously (patent by angio in 2007). - ABI 06/2011 normal. b. Carotid US  0-39% BICA 06/2011 (for f/u 2 yrs);   c.   ABIs 9/13:  normal (1.1 bilaterally)   Personal history of peptic ulcer disease    Remotely.   Psychiatric pseudoseizure    per patient and family   TIA (transient ischemic attack)    Tobacco use disorder    Type II or unspecified type diabetes mellitus without mention of complication, not stated as uncontrolled    Unspecified essential hypertension     Past Surgical History:  Procedure Laterality Date   APPENDECTOMY     BREAST BIOPSY     BRONCHIAL BIOPSY  04/19/2023   Procedure: BRONCHIAL BIOPSIES;  Surgeon: [name] Lamar RAMAN, MD;  Location: [facility] ENDOSCOPY;  Service: Pulmonary;;   BRONCHIAL BRUSHINGS  04/19/2023   Procedure: BRONCHIAL BRUSHINGS;  Surgeon: [name] Lamar RAMAN, MD;  Location: [facility] ENDOSCOPY;  Service: Pulmonary;;   BRONCHIAL NEEDLE ASPIRATION BIOPSY  04/19/2023   Procedure: BRONCHIAL NEEDLE ASPIRATION BIOPSIES;  Surgeon: [name] Lamar RAMAN, MD;  Location: [facility] ENDOSCOPY;  Service: Pulmonary;;   BRONCHIAL WASHINGS  04/19/2023   Procedure: BRONCHIAL WASHINGS;  Surgeon: [name] Lamar RAMAN, MD;  Location: [facility] ENDOSCOPY;  Service: Pulmonary;;   CARDIAC CATHETERIZATION  2009   patent RCA stent. mild CAD otherwise.    CATARACT EXTRACTION, BILATERAL  2016   CHOLECYSTECTOMY  2009   FIDUCIAL MARKER PLACEMENT  04/19/2023   Procedure: FIDUCIAL MARKER PLACEMENT;  Surgeon: [name] Lamar RAMAN, MD;  Location: [facility] ENDOSCOPY;  Service: Pulmonary;;   FLEXIBLE SIGMOIDOSCOPY N/A 10/21/2022   Procedure: ENID MORIN;  Surgeon: Kristie Lamprey, MD;  Location: [facility] ENDOSCOPY;  Service: Gastroenterology;  Laterality: N/A;   hysterectomy (other)     age 73   KNEE ARTHROSCOPY Left    left arm surgery     LEFT HEART CATHETERIZATION WITH CORONARY ANGIOGRAM N/A 01/16/2014   Procedure: LEFT HEART CATHETERIZATION WITH CORONARY ANGIOGRAM;  Surgeon: Ozell JONETTA Fell, MD;  Location: [facility] CATH LAB;  Service: Cardiovascular;  Laterality: N/A;   left leg angiography     stenting of the left common iliac artery   left rotator cuff  repair     right coronary artery stent      Family History  Problem Relation Age of Onset   Heart attack Mother    Allergies Mother    Heart attack Father    Allergies Father    Kidney disease Father    Hypertension Father    Melanoma Sister    Coronary artery disease Brother    Breast cancer Maternal Aunt    Breast cancer Maternal Aunt    Breast cancer Paternal Aunt        d. under 51   Pancreatic cancer Maternal Grandmother    Breast cancer Daughter        BRCA1+    Social History:  reports that she has been smoking cigarettes. She has a 66 pack-year smoking history. She has never used smokeless tobacco. She reports that she does not drink alcohol  and does not use drugs.The patient is accompanied by her daughter today.  Allergies:  Allergies  Allergen Reactions   Amoxicillin Hives and Nausea And Vomiting   Claritin [Loratadine] Other (See Comments)    Pt's daughter notified us  of this allergy (reaction?)   Insulins Nausea And Vomiting and Other (See Comments)    Patient unsure of name of insulin    Penicillins Other (See Comments)   Vioxx [Rofecoxib] Hives   Celecoxib Hives and Nausea And Vomiting   Liraglutide Nausea And Vomiting and Other (See Comments)    Victoza   Propranolol Diarrhea and Nausea And Vomiting    Current Medications: Current Outpatient Medications  Medication Sig Dispense Refill   atorvastatin  (LIPITOR) 10 MG tablet Take 10 mg by mouth at bedtime.     donepezil (ARICEPT) 10 MG tablet Take 10 mg by mouth daily.     OLANZapine (ZYPREXA) 2.5 MG tablet Take 2.5 mg by mouth at bedtime.     primidone (MYSOLINE) 50 MG tablet Take 50 mg by mouth at bedtime.     traZODone (DESYREL) 50 MG tablet Take 50 mg by mouth at bedtime.     acetaminophen  (TYLENOL ) 500 MG tablet Take 1,000 mg by mouth as needed for moderate pain.     ALPRAZolam  (XANAX ) 0.25 MG tablet Take 1 tablet (0.25 mg total) by mouth 2 (two) times daily as needed for anxiety. 30 tablet 0    aspirin  EC 81 MG tablet Take 1 tablet (81 mg total) by mouth daily. 30 tablet 11   busPIRone  (BUSPAR ) 7.5 MG tablet Take  1 tablet (7.5 mg total) by mouth daily as needed (for anxiety). 30 tablet 0   clopidogrel  (PLAVIX ) 75 MG tablet Take 1 tablet (75 mg total) by mouth daily. 30 tablet 0   divalproex  (DEPAKOTE ) 250 MG DR tablet Take 1 tablet (250 mg total) by mouth 2 (two) times daily. 30 tablet 0   DULoxetine  (CYMBALTA ) 60 MG capsule Take 60 mg by mouth at bedtime.     ezetimibe  (ZETIA ) 10 MG tablet Take 10 mg by mouth daily.     famotidine  (PEPCID ) 20 MG tablet Take 20 mg by mouth 2 (two) times daily.     gabapentin (NEURONTIN) 100 MG capsule Take 100 mg by mouth 2 (two) times daily.     hydrALAZINE  (APRESOLINE ) 50 MG tablet Take 50 mg by mouth in the morning and at bedtime.     ipratropium-albuterol  (DUONEB) 0.5-2.5 (3) MG/3ML SOLN Take 3 mLs by nebulization every 6 (six) hours as needed (for shortness of breath or wheezing).     JARDIANCE  25 MG TABS tablet Take 1 tablet (25 mg total) by mouth daily. 30 tablet 2   metoprolol  tartrate (LOPRESSOR ) 25 MG tablet Take 25 mg by mouth 2 (two) times daily.     nitroGLYCERIN  (NITROSTAT ) 0.4 MG SL tablet Place 0.4 mg under the tongue every 5 (five) minutes as needed for chest pain.     pantoprazole  (PROTONIX ) 40 MG tablet Take 40 mg by mouth 2 (two) times daily.     PROLIA  60 MG/ML SOSY injection Inject 60 mg into the skin every 6 (six) months.     vancomycin (VANCOCIN) 125 MG capsule Take 125 mg by mouth 4 (four) times daily.     XIFAXAN 550 MG TABS tablet Take 550 mg by mouth 3 (three) times daily.     zonisamide  (ZONEGRAN ) 100 MG capsule Take 200 mg by mouth at bedtime.     No current facility-administered medications for this visit.    I,Nattie Lazenby H Norberta Stobaugh,acting as a scribe for Lindsay VEAR Cornish, MD.,have documented all relevant documentation on the behalf of Lindsay VEAR Cornish, MD,as directed by  Lindsay VEAR Cornish, MD while in the presence  of Lindsay VEAR Cornish, MD.     "

## 2024-08-02 NOTE — Telephone Encounter (Signed)
 Patient has been scheduled for follow-up visit per 08/02/2024 LOS.  Pt given an appt calendar with date and time.

## 2024-08-12 ENCOUNTER — Other Ambulatory Visit: Payer: Self-pay | Admitting: Oncology

## 2024-08-12 ENCOUNTER — Encounter: Payer: Self-pay | Admitting: Hematology and Oncology

## 2024-08-12 DIAGNOSIS — E041 Nontoxic single thyroid nodule: Secondary | ICD-10-CM

## 2024-08-15 ENCOUNTER — Ambulatory Visit (HOSPITAL_BASED_OUTPATIENT_CLINIC_OR_DEPARTMENT_OTHER)
Admission: RE | Admit: 2024-08-15 | Discharge: 2024-08-15 | Disposition: A | Discharge status: Home / Self Care | Source: Ambulatory Visit | Attending: Oncology | Admitting: Oncology

## 2024-08-15 DIAGNOSIS — E041 Nontoxic single thyroid nodule: Secondary | ICD-10-CM | POA: Diagnosis not present

## 2024-08-21 ENCOUNTER — Encounter: Payer: Self-pay | Admitting: Hematology and Oncology

## 2024-08-28 ENCOUNTER — Encounter: Payer: Self-pay | Admitting: Hematology and Oncology

## 2025-01-24 ENCOUNTER — Inpatient Hospital Stay

## 2025-01-25 ENCOUNTER — Other Ambulatory Visit (HOSPITAL_BASED_OUTPATIENT_CLINIC_OR_DEPARTMENT_OTHER): Admitting: Radiology

## 2025-01-31 ENCOUNTER — Inpatient Hospital Stay: Admitting: Oncology

## 2025-02-02 ENCOUNTER — Ambulatory Visit (HOSPITAL_BASED_OUTPATIENT_CLINIC_OR_DEPARTMENT_OTHER): Admitting: Radiology
